# Patient Record
Sex: Female | Born: 1950
Health system: Southern US, Community
[De-identification: ages and names within clinical notes are randomized; demographics above are authoritative.]

## PROBLEM LIST (undated history)

## (undated) DIAGNOSIS — E559 Vitamin D deficiency, unspecified: Secondary | ICD-10-CM

## (undated) DIAGNOSIS — I639 Cerebral infarction, unspecified: Secondary | ICD-10-CM

## (undated) DIAGNOSIS — E119 Type 2 diabetes mellitus without complications: Secondary | ICD-10-CM

## (undated) DIAGNOSIS — I10 Essential (primary) hypertension: Secondary | ICD-10-CM

## (undated) DIAGNOSIS — E785 Hyperlipidemia, unspecified: Secondary | ICD-10-CM

## (undated) HISTORY — DX: Vitamin D deficiency, unspecified: E55.9

## (undated) HISTORY — DX: Hyperlipidemia, unspecified: E78.5

## (undated) HISTORY — DX: Cerebral infarction, unspecified: I63.9

## (undated) HISTORY — PX: EYE SURGERY: SHX253

---

## 2005-11-05 ENCOUNTER — Emergency Department (HOSPITAL_COMMUNITY): Admission: EM | Admit: 2005-11-05 | Discharge: 2005-11-05 | Payer: Self-pay | Admitting: *Deleted

## 2005-12-10 ENCOUNTER — Encounter: Admission: RE | Admit: 2005-12-10 | Discharge: 2005-12-10 | Payer: Self-pay | Admitting: General Practice

## 2005-12-18 ENCOUNTER — Encounter (HOSPITAL_COMMUNITY): Admission: RE | Admit: 2005-12-18 | Discharge: 2005-12-18 | Payer: Self-pay | Admitting: Cardiology

## 2012-11-17 ENCOUNTER — Emergency Department (HOSPITAL_COMMUNITY)
Admission: EM | Admit: 2012-11-17 | Discharge: 2012-11-17 | Disposition: A | Discharge status: Home / Self Care | Payer: PRIVATE HEALTH INSURANCE | Source: Home / Self Care | Attending: Family Medicine | Admitting: Family Medicine

## 2012-11-17 ENCOUNTER — Encounter (HOSPITAL_COMMUNITY): Payer: Self-pay | Admitting: Emergency Medicine

## 2012-11-17 DIAGNOSIS — I10 Essential (primary) hypertension: Secondary | ICD-10-CM

## 2012-11-17 DIAGNOSIS — R7309 Other abnormal glucose: Secondary | ICD-10-CM

## 2012-11-17 DIAGNOSIS — R110 Nausea: Secondary | ICD-10-CM

## 2012-11-17 DIAGNOSIS — R739 Hyperglycemia, unspecified: Secondary | ICD-10-CM

## 2012-11-17 DIAGNOSIS — R51 Headache: Secondary | ICD-10-CM

## 2012-11-17 LAB — POCT I-STAT, CHEM 8
Calcium, Ion: 1.24 mmol/L (ref 1.13–1.30)
Chloride: 100 mEq/L (ref 96–112)
Glucose, Bld: 242 mg/dL — ABNORMAL HIGH (ref 70–99)
HCT: 51 % — ABNORMAL HIGH (ref 36.0–46.0)
Hemoglobin: 17.3 g/dL — ABNORMAL HIGH (ref 12.0–15.0)
Potassium: 4.3 mEq/L (ref 3.5–5.1)

## 2012-11-17 MED ORDER — ACETAMINOPHEN 325 MG PO TABS
650.0000 mg | ORAL_TABLET | Freq: Once | ORAL | Status: AC
  Administered 2012-11-17: 650 mg via ORAL

## 2012-11-17 MED ORDER — ACETAMINOPHEN 325 MG PO TABS
ORAL_TABLET | ORAL | Status: AC
  Filled 2012-11-17: qty 2

## 2012-11-17 MED ORDER — HYDROCHLOROTHIAZIDE 25 MG PO TABS: 25.0000 mg | ORAL_TABLET | Freq: Every day | ORAL | Status: DC

## 2012-11-17 MED ORDER — ONDANSETRON 4 MG PO TBDP
8.0000 mg | ORAL_TABLET | Freq: Once | ORAL | Status: AC
  Administered 2012-11-17: 8 mg via ORAL

## 2012-11-17 MED ORDER — PROMETHAZINE HCL 25 MG PO TABS: 12.5000 mg | ORAL_TABLET | Freq: Four times a day (QID) | ORAL | Status: DC | PRN

## 2012-11-17 MED ORDER — ONDANSETRON 4 MG PO TBDP
ORAL_TABLET | ORAL | Status: AC
  Filled 2012-11-17: qty 2

## 2012-11-17 NOTE — ED Provider Notes (Signed)
Aimee Parker is a 62 y.o. female who presents to Urgent Care today for  1) nausea present off-and-on for several days. Patient has not had any vomiting or diarrhea or significant abdominal pain. She has not tried any medications yet. No other sick contacts. No fevers or chills. Everyone else as well. 2) headache present off-and-on for several days. Headache is mild and right-sided. She denies any weakness numbness loss of coordination or fatigue or lethargy. She has not tried any medications yet. 3) hypertension: Patient has a remote past history for hypertension. She has been on some unknown medications in the past. She has not taken any medications for several years now. She has not yet established with a primary care provider.  No chest pains palpitations or trouble breathing.   History reviewed. No pertinent past medical history. History  Substance Use Topics  . Smoking status: Not on file  . Smokeless tobacco: Not on file  . Alcohol Use: Not on file   ROS as above Medications reviewed. No current facility-administered medications for this encounter.   Current Outpatient Prescriptions  Medication Sig Dispense Refill  . hydrochlorothiazide (HYDRODIURIL) 25 MG tablet Take 1 tablet (25 mg total) by mouth daily.  30 tablet  0  . promethazine (PHENERGAN) 25 MG tablet Take 0.5-1 tablets (12.5-25 mg total) by mouth every 6 (six) hours as needed for nausea or vomiting.  30 tablet  0    Exam:  BP 188/78  Pulse 62  Temp(Src) 98.3 F (36.8 C) (Oral)  Resp 16  SpO2 98% Gen: Well NAD HEENT: EOMI,  MMM Lungs: CTABL Nl WOB Heart: RRR  systolic murmur present in the upper right sternal border radiating to the neck Abd: NABS, NT, ND soft no rebound or guarding no CVA tenderness to percussion Exts: Non edematous BL  LE, warm and well perfused.  Neuro: Alert and oriented normal balance and coordination normal gait.  Results for orders placed during the hospital encounter of 11/17/12 (from  the past 24 hour(s))  POCT I-STAT, CHEM 8     Status: Abnormal   Collection Time    11/17/12 11:35 AM      Result Value Range   Sodium 138  135 - 145 mEq/L   Potassium 4.3  3.5 - 5.1 mEq/L   Chloride 100  96 - 112 mEq/L   BUN 14  6 - 23 mg/dL   Creatinine, Ser 1.61  0.50 - 1.10 mg/dL   Glucose, Bld 096 (*) 70 - 99 mg/dL   Calcium, Ion 0.45  4.09 - 1.30 mmol/L   TCO2 28  0 - 100 mmol/L   Hemoglobin 17.3 (*) 12.0 - 15.0 g/dL   HCT 81.1 (*) 91.4 - 78.2 %   No results found.  Assessment and Plan: 62 y.o. female with  1) headache: Unclear etiology. Possible viral illness. Patient does not have any neurological abnormalities. Plan to treat empirically with Tylenol. Will present to [facility] for further evaluation and management.  2) nausea: Likely viral illness. Possibly related headache. Plan to treat with Zofran in the clinic and Phenergan at home. Followup as needed to 3) hypertension: Plan to restart hydrochlorothiazide. Followup at [facility]. 4) hyperglycemia: Likely diabetes. Reluctant to start metformin this patient has nausea already. Additionally I reluctant to start glipizide at this time as patient does not have a glucometer or any diabetes instructions. She is currently asymptomatic with her hyperglycemia. We'll followup at [facility].  Discussed warning signs or symptoms. Please see discharge instructions. Patient expresses understanding.      Rodolph Bong, MD 11/17/12 [phone]

## 2012-11-17 NOTE — ED Notes (Signed)
C/o headache and nausea for two days which comes and goes. Patient states when she does feel like this she stays still til the feeling end. Sx last for 5 minutes and then in an hour or two restarts.   No treatment done.

## 2012-11-30 ENCOUNTER — Encounter (HOSPITAL_COMMUNITY): Payer: Self-pay | Admitting: Emergency Medicine

## 2012-11-30 ENCOUNTER — Emergency Department (HOSPITAL_COMMUNITY)
Admission: EM | Admit: 2012-11-30 | Discharge: 2012-11-30 | Disposition: A | Discharge status: Home / Self Care | Payer: PRIVATE HEALTH INSURANCE | Source: Home / Self Care

## 2012-11-30 DIAGNOSIS — R42 Dizziness and giddiness: Secondary | ICD-10-CM

## 2012-11-30 DIAGNOSIS — R51 Headache: Secondary | ICD-10-CM

## 2012-11-30 DIAGNOSIS — H9390 Unspecified disorder of ear, unspecified ear: Secondary | ICD-10-CM

## 2012-11-30 DIAGNOSIS — H8390 Unspecified disease of inner ear, unspecified ear: Secondary | ICD-10-CM

## 2012-11-30 HISTORY — DX: Essential (primary) hypertension: I10

## 2012-11-30 MED ORDER — KETOROLAC TROMETHAMINE 30 MG/ML IJ SOLN
INTRAMUSCULAR | Status: AC
  Filled 2012-11-30: qty 1

## 2012-11-30 MED ORDER — METHYLPREDNISOLONE SODIUM SUCC 125 MG IJ SOLR
INTRAMUSCULAR | Status: AC
  Filled 2012-11-30: qty 2

## 2012-11-30 MED ORDER — ONDANSETRON HCL 4 MG PO TABS: 4.0000 mg | ORAL_TABLET | Freq: Four times a day (QID) | ORAL | Status: DC

## 2012-11-30 MED ORDER — METHYLPREDNISOLONE SODIUM SUCC 125 MG IJ SOLR
60.0000 mg | Freq: Once | INTRAMUSCULAR | Status: AC
  Administered 2012-11-30: 60 mg via INTRAMUSCULAR

## 2012-11-30 MED ORDER — KETOROLAC TROMETHAMINE 30 MG/ML IJ SOLN
30.0000 mg | Freq: Once | INTRAMUSCULAR | Status: AC
  Administered 2012-11-30: 30 mg via INTRAMUSCULAR

## 2012-11-30 MED ORDER — ONDANSETRON 4 MG PO TBDP
ORAL_TABLET | ORAL | Status: AC
  Filled 2012-11-30: qty 1

## 2012-11-30 MED ORDER — MECLIZINE HCL 25 MG PO TABS: 25.0000 mg | ORAL_TABLET | Freq: Three times a day (TID) | ORAL | Status: DC | PRN

## 2012-11-30 MED ORDER — ONDANSETRON 4 MG PO TBDP
4.0000 mg | ORAL_TABLET | Freq: Once | ORAL | Status: AC
  Administered 2012-11-30: 4 mg via ORAL

## 2012-11-30 NOTE — ED Provider Notes (Signed)
Medical screening examination/treatment/procedure(s) were performed by non-physician practitioner and as supervising physician I was immediately available for consultation/collaboration.  Zeva Leber, M.D.  Aunica Dauphinee C Surina Storts, MD 11/30/12 1623 

## 2012-11-30 NOTE — ED Notes (Signed)
C/o headache and blurry vision.  No weakness, reports this headache started last night. Patient was seen 11/6 for the same.

## 2012-11-30 NOTE — ED Provider Notes (Signed)
CSN: [id number]     Arrival date & time 11/30/12  1203 History   First MD Initiated Contact with Patient 11/30/12 1228     Chief Complaint  Patient presents with  . Headache   (Consider location/radiation/quality/duration/timing/severity/associated sxs/prior Treatment) HPI Comments: 62 year old female Asian woman who does not speak [place] and is accompanied by a significant other who translates for her. Last p.m. she developed dizziness followed by a headache. States her whole head hurts. The dizziness is worse with head movements and change in body positions. She prefers to remain in one position to limit her symptoms. She has nausea but no vomiting. Occasionally with blurred vision associated with dizziness.   Past Medical History  Diagnosis Date  . Hypertension    History reviewed. No pertinent past surgical history. No family history on file. History  Substance Use Topics  . Smoking status: Never Smoker   . Smokeless tobacco: Not on file  . Alcohol Use: No   OB History   Grav Para Term Preterm Abortions TAB SAB Ect Mult Living                 Review of Systems  Constitutional: Positive for activity change. Negative for fever and fatigue.  HENT: Negative for congestion, ear pain, facial swelling, hearing loss, postnasal drip, rhinorrhea, sinus pressure, sneezing, sore throat and trouble swallowing.   Eyes: Negative for photophobia and pain.  Respiratory: Negative for cough and shortness of breath.   Cardiovascular: Negative for chest pain.  Gastrointestinal: Positive for nausea. Negative for vomiting and diarrhea.  Genitourinary: Negative.   Musculoskeletal: Negative.   Skin: Negative for rash.  Neurological: Positive for dizziness and headaches. Negative for tremors, seizures, syncope, facial asymmetry, speech difficulty, weakness and numbness.  Psychiatric/Behavioral: Negative.     Allergies  Review of patient's allergies indicates no known allergies.  Home  Medications   Current Outpatient Rx  Name  Route  Sig  Dispense  Refill  . hydrochlorothiazide (HYDRODIURIL) 25 MG tablet   Oral   Take 1 tablet (25 mg total) by mouth daily.   30 tablet   0   . meclizine (ANTIVERT) 25 MG tablet   Oral   Take 1 tablet (25 mg total) by mouth 3 (three) times daily as needed for dizziness.   28 tablet   0   . ondansetron (ZOFRAN) 4 MG tablet   Oral   Take 1 tablet (4 mg total) by mouth every 6 (six) hours.   12 tablet   0   . promethazine (PHENERGAN) 25 MG tablet   Oral   Take 0.5-1 tablets (12.5-25 mg total) by mouth every 6 (six) hours as needed for nausea or vomiting.   30 tablet   0    BP 191/87  Pulse 69  Temp(Src) 97.2 F (36.2 C) (Oral)  Resp 20  SpO2 96% Physical Exam  Nursing note and vitals reviewed. Constitutional: She is oriented to person, place, and time. She appears well-developed and well-nourished. No distress.  HENT:  Head: Normocephalic and atraumatic.  Right Ear: External ear normal.  Left Ear: External ear normal.  Mouth/Throat: Oropharynx is clear and moist. No oropharyngeal exudate.  Soft palate rises symmetrically  Eyes: Conjunctivae and EOM are normal. Pupils are equal, round, and reactive to light.  Neck: Normal range of motion. Neck supple.  Cardiovascular: Normal rate, regular rhythm and normal heart sounds.   Pulmonary/Chest: Effort normal and breath sounds normal. No respiratory distress. She has no wheezes. She has no  rales.  Abdominal: Soft. There is no tenderness.  Musculoskeletal: Normal range of motion. She exhibits no edema and no tenderness.  Lymphadenopathy:    She has no cervical adenopathy.  Neurological: She is alert and oriented to person, place, and time. She has normal strength. No cranial nerve deficit or sensory deficit. She exhibits normal muscle tone. Coordination and gait normal.  Reflex Scores:      Patellar reflexes are 1+ on the right side and 1+ on the left side. Skin: Skin is  warm and dry.  Psychiatric: She has a normal mood and affect. Judgment and thought content normal.    ED Course  Procedures (including critical care time) Labs Review Labs Reviewed - No data to display Imaging Review No results found.    MDM   1. Disorder of inner ear, unspecified laterality   2. Dizziness   3. Headache     Neurologically stable. Unremarkable neuro exam. Suspect inner ear disorder or BPV.  Toradol 30 mg and solumedrol 60mg  IM and zofran 4 mg po now. Rx for zofran and meclizine as directed Go to the [facility] now to get appointment since they dont answer the phone when you call.  Hayden Rasmussen, NP 11/30/12 1257

## 2012-12-22 ENCOUNTER — Ambulatory Visit: Payer: PRIVATE HEALTH INSURANCE | Attending: Internal Medicine | Admitting: Internal Medicine

## 2012-12-22 ENCOUNTER — Encounter: Payer: Self-pay | Admitting: Internal Medicine

## 2012-12-22 VITALS — BP 145/78 | HR 80 | Temp 98.0°F | Resp 16 | Wt 142.4 lb

## 2012-12-22 DIAGNOSIS — Z23 Encounter for immunization: Secondary | ICD-10-CM

## 2012-12-22 DIAGNOSIS — H538 Other visual disturbances: Secondary | ICD-10-CM

## 2012-12-22 DIAGNOSIS — Z139 Encounter for screening, unspecified: Secondary | ICD-10-CM

## 2012-12-22 DIAGNOSIS — I10 Essential (primary) hypertension: Secondary | ICD-10-CM

## 2012-12-22 LAB — CBC WITH DIFFERENTIAL/PLATELET
Basophils Absolute: 0 10*3/uL (ref 0.0–0.1)
Eosinophils Absolute: 0.2 10*3/uL (ref 0.0–0.7)
Eosinophils Relative: 2 % (ref 0–5)
Hemoglobin: 14.1 g/dL (ref 12.0–15.0)
Lymphs Abs: 4.5 10*3/uL — ABNORMAL HIGH (ref 0.7–4.0)
MCV: 70.3 fL — ABNORMAL LOW (ref 78.0–100.0)
RDW: 16 % — ABNORMAL HIGH (ref 11.5–15.5)
WBC: 10.3 10*3/uL (ref 4.0–10.5)

## 2012-12-22 LAB — LIPID PANEL: Triglycerides: 406 mg/dL — ABNORMAL HIGH (ref <150)

## 2012-12-22 LAB — COMPLETE METABOLIC PANEL WITH GFR
ALT: 105 U/L — ABNORMAL HIGH (ref 0–35)
Alkaline Phosphatase: 99 U/L (ref 39–117)
BUN: 16 mg/dL (ref 6–23)
Calcium: 9.8 mg/dL (ref 8.4–10.5)
Chloride: 95 mEq/L — ABNORMAL LOW (ref 96–112)
GFR, Est African American: >89 mL/min
GFR, Est Non African American: >89 mL/min
Glucose, Bld: 365 mg/dL — ABNORMAL HIGH (ref 70–99)
Potassium: 4.2 mEq/L (ref 3.5–5.3)
Total Bilirubin: 0.6 mg/dL (ref 0.3–1.2)

## 2012-12-22 MED ORDER — HYDROCHLOROTHIAZIDE 25 MG PO TABS: 25.0000 mg | ORAL_TABLET | Freq: Every day | ORAL | Status: DC

## 2012-12-22 NOTE — Progress Notes (Signed)
Patient here to establish care Has hypertension Takes HCTZ

## 2012-12-22 NOTE — Progress Notes (Signed)
MRN: [id number] Name: Aimee Parker  Sex: female Age: 62 y.o. DOB: [birth year]  Allergies: Review of patient's allergies indicates no known allergies.  Chief Complaint  Patient presents with  . Establish Care    HPI: Patient is 62 y.o. female who is accompanied with a family member comes today to establish medical care, she has history of hypertension and is on hydrochlorothiazide, patient is requesting refill on the medication, denies any headache dizziness chest shortness of breath reported to have some blurry vision and would like to see ophthalmologist.   Past Medical History  Diagnosis Date  . Hypertension     History reviewed. No pertinent past surgical history.    Medication List       This list is accurate as of: 12/22/12 12:40 PM.  Always use your most recent med list.               hydrochlorothiazide 25 MG tablet  Commonly known as:  HYDRODIURIL  Take 1 tablet (25 mg total) by mouth daily.     meclizine 25 MG tablet  Commonly known as:  ANTIVERT  Take 1 tablet (25 mg total) by mouth 3 (three) times daily as needed for dizziness.     ondansetron 4 MG tablet  Commonly known as:  ZOFRAN  Take 1 tablet (4 mg total) by mouth every 6 (six) hours.     promethazine 25 MG tablet  Commonly known as:  PHENERGAN  Take 0.5-1 tablets (12.5-25 mg total) by mouth every 6 (six) hours as needed for nausea or vomiting.        Meds ordered this encounter  Medications  . hydrochlorothiazide (HYDRODIURIL) 25 MG tablet    Sig: Take 1 tablet (25 mg total) by mouth daily.    Dispense:  30 tablet    Refill:  3     There is no immunization history on file for this patient.  History  Substance Use Topics  . Smoking status: Never Smoker   . Smokeless tobacco: Not on file  . Alcohol Use: No    Review of Systems  As noted in HPI  Filed Vitals:   12/22/12 1224  BP: 145/78  Pulse: 80  Temp: 98 F (36.7 C)  Resp: 16    Physical Exam  Physical Exam   Constitutional: She is oriented to person, place, and time. No distress.  Eyes: EOM are normal. Pupils are equal, round, and reactive to light.  Left eye pterygium  Cardiovascular: Normal rate and regular rhythm.   Pulmonary/Chest: Breath sounds normal. No respiratory distress. She has no wheezes.  Musculoskeletal: She exhibits no edema.  Neurological: She is alert and oriented to person, place, and time.    CBC    Component Value Date/Time   HGB 17.3* 11/17/2012 1135   HCT 51.0* 11/17/2012 1135    CMP     Component Value Date/Time   NA 138 11/17/2012 1135   K 4.3 11/17/2012 1135   CL 100 11/17/2012 1135   GLUCOSE 242* 11/17/2012 1135   BUN 14 11/17/2012 1135   CREATININE 0.80 11/17/2012 1135    No results found for this basename: chol, tri, ldl    No components found with this basename: hga1c    No results found for this basename: AST    Assessment and Plan  Essential hypertension, benign - Plan: COMPLETE METABOLIC PANEL WITH GFR, advised patient for her low salt diet continue with hydrochlorothiazide (HYDRODIURIL) 25 MG tablet, Ambulatory referral to Ophthalmology  Screening -  Plan: CBC with Differential, COMPLETE METABOLIC PANEL WITH GFR, TSH, Lipid panel, Vit D  25 hydroxy (rtn osteoporosis monitoring)  Blurry vision - Plan: Ambulatory referral to Ophthalmology  Needs flu shot   Health Maintenance    Return in about 6 weeks (around 02/02/2013).  Doris Cheadle, MD

## 2012-12-23 ENCOUNTER — Telehealth: Payer: Self-pay | Admitting: Emergency Medicine

## 2012-12-23 LAB — VITAMIN D 25 HYDROXY (VIT D DEFICIENCY, FRACTURES): Vit D, 25-Hydroxy: 18 ng/mL — ABNORMAL LOW (ref 30–89)

## 2012-12-23 MED ORDER — VITAMIN D (ERGOCALCIFEROL) 1.25 MG (50000 UNIT) PO CAPS: 50000.0000 [IU] | ORAL_CAPSULE | ORAL | Status: DC

## 2012-12-23 MED ORDER — METFORMIN HCL 500 MG PO TABS: 500.0000 mg | ORAL_TABLET | Freq: Every day | ORAL | Status: DC

## 2012-12-23 MED ORDER — FENOFIBRATE 48 MG PO TABS: 48.0000 mg | ORAL_TABLET | Freq: Every day | ORAL | Status: DC

## 2012-12-23 NOTE — Progress Notes (Signed)
PT GIVEN LAB RESULTS WITH MEDICATION INSTRUCTIONS. PT INFORMED TO PICK SCRIPTS AT [facility]. WILL RECHECK LABS IN 3 MNTHS

## 2012-12-23 NOTE — Telephone Encounter (Signed)
Message copied by Darlis Loan on Fri Dec 23, 2012 11:36 AM ------      Message from: Doris Cheadle      Created: Fri Dec 23, 2012 10:29 AM       Blood work reviewed noticed elevated cholesterol as well as elevated sugar, call patient advise for low carbohydrate and low-fat diet also start 500 mg metformin daily and fenofibrate 48 mg daily       also noticed low vitamin D,  advise to start ergocalciferol 50,000 units once a week for the duration of  12 weeks.      Follow up as scheduled. Will check hemoglobin A1c on the next visit.       ------

## 2012-12-23 NOTE — Telephone Encounter (Signed)
Pt given lab results and new scripts sent to [facility] pharmacy

## 2012-12-23 NOTE — Addendum Note (Signed)
Addended by: Nonnie Done D on: 12/23/2012 12:21 PM   Modules accepted: Orders

## 2013-02-02 ENCOUNTER — Ambulatory Visit: Payer: PRIVATE HEALTH INSURANCE | Attending: Internal Medicine | Admitting: Internal Medicine

## 2013-02-02 VITALS — BP 193/83 | HR 76 | Temp 98.9°F | Resp 14 | Ht 64.0 in | Wt 142.0 lb

## 2013-02-02 DIAGNOSIS — E119 Type 2 diabetes mellitus without complications: Secondary | ICD-10-CM | POA: Insufficient documentation

## 2013-02-02 DIAGNOSIS — I10 Essential (primary) hypertension: Secondary | ICD-10-CM

## 2013-02-02 DIAGNOSIS — E781 Pure hyperglyceridemia: Secondary | ICD-10-CM

## 2013-02-02 DIAGNOSIS — Z23 Encounter for immunization: Secondary | ICD-10-CM

## 2013-02-02 LAB — COMPLETE METABOLIC PANEL WITH GFR
ALBUMIN: 4.5 g/dL (ref 3.5–5.2)
ALT: 41 U/L — ABNORMAL HIGH (ref 0–35)
AST: 48 U/L — ABNORMAL HIGH (ref 0–37)
Alkaline Phosphatase: 64 U/L (ref 39–117)
BUN: 20 mg/dL (ref 6–23)
CALCIUM: 9.7 mg/dL (ref 8.4–10.5)
CO2: 30 mEq/L (ref 19–32)
CREATININE: 0.92 mg/dL (ref 0.50–1.10)
Chloride: 96 mEq/L (ref 96–112)
GFR, EST AFRICAN AMERICAN: 77 mL/min
GFR, EST NON AFRICAN AMERICAN: 67 mL/min
Glucose, Bld: 357 mg/dL — ABNORMAL HIGH (ref 70–99)
POTASSIUM: 4.5 meq/L (ref 3.5–5.3)
SODIUM: 135 meq/L (ref 135–145)
TOTAL PROTEIN: 7.7 g/dL (ref 6.0–8.3)
Total Bilirubin: 0.5 mg/dL (ref 0.3–1.2)

## 2013-02-02 LAB — POCT GLYCOSYLATED HEMOGLOBIN (HGB A1C): HEMOGLOBIN A1C: 13.1

## 2013-02-02 LAB — LIPID PANEL
CHOL/HDL RATIO: 3.4 ratio
CHOLESTEROL: 232 mg/dL — AB (ref 0–200)
HDL: 68 mg/dL (ref >39)
LDL Cholesterol: 125 mg/dL — ABNORMAL HIGH (ref 0–99)
TRIGLYCERIDES: 197 mg/dL — AB (ref <150)
VLDL: 39 mg/dL (ref 0–40)

## 2013-02-02 LAB — GLUCOSE, POCT (MANUAL RESULT ENTRY): POC Glucose: 325 mg/dl — AB (ref 70–99)

## 2013-02-02 MED ORDER — GLUCOSE BLOOD VI STRP: ORAL_STRIP | Status: DC

## 2013-02-02 MED ORDER — TRAZODONE HCL 50 MG PO TABS: 25.0000 mg | ORAL_TABLET | Freq: Every evening | ORAL | Status: DC | PRN

## 2013-02-02 MED ORDER — HYDROCHLOROTHIAZIDE 25 MG PO TABS: 25.0000 mg | ORAL_TABLET | Freq: Every day | ORAL | Status: DC

## 2013-02-02 MED ORDER — FREESTYLE SYSTEM KIT: 1.0000 | PACK | Status: DC | PRN

## 2013-02-02 MED ORDER — METFORMIN HCL 850 MG PO TABS: 850.0000 mg | ORAL_TABLET | Freq: Two times a day (BID) | ORAL | Status: DC

## 2013-02-02 MED ORDER — "INSULIN SYRINGE 28G X 1/2"" 1 ML MISC": Status: DC

## 2013-02-02 MED ORDER — INSULIN NPH ISOPHANE & REGULAR (70-30) 100 UNIT/ML ~~LOC~~ SUSP: 15.0000 [IU] | Freq: Two times a day (BID) | SUBCUTANEOUS | Status: DC

## 2013-02-02 MED ORDER — LISINOPRIL 20 MG PO TABS: 20.0000 mg | ORAL_TABLET | Freq: Every day | ORAL | Status: DC

## 2013-02-02 NOTE — Progress Notes (Signed)
Pt is here for a f/u for hypertension. BP today is 193/83. Complains about having trouble sleeping x1 week.  Has questions about medications. Pt's son is her interpreter.

## 2013-02-02 NOTE — Progress Notes (Signed)
Patient ID: Aimee Parker, female   DOB: 1950/11/30, 63 y.o.   MRN: [id number]  CC:  HPI:  63 year old female, [place] presents to the clinic for a followup. The patient has multiple issues she is hypertensive with systolic blood pressure the 190s. On her most recent labs on 12/22/12, the patient had mildly elevated AST, ALT, triglycerides of 406, she states that she has been compliant with most of her medications, she states that she worried a lot and has a hard time falling asleep   No Known Allergies Past Medical History  Diagnosis Date  . Hypertension    Current Outpatient Prescriptions on File Prior to Visit  Medication Sig Dispense Refill  . fenofibrate (TRICOR) 48 MG tablet Take 1 tablet (48 mg total) by mouth daily.  90 tablet  3  . meclizine (ANTIVERT) 25 MG tablet Take 1 tablet (25 mg total) by mouth 3 (three) times daily as needed for dizziness.  28 tablet  0  . ondansetron (ZOFRAN) 4 MG tablet Take 1 tablet (4 mg total) by mouth every 6 (six) hours.  12 tablet  0  . promethazine (PHENERGAN) 25 MG tablet Take 0.5-1 tablets (12.5-25 mg total) by mouth every 6 (six) hours as needed for nausea or vomiting.  30 tablet  0  . Vitamin D, Ergocalciferol, (DRISDOL) 50000 UNITS CAPS capsule Take 1 capsule (50,000 Units total) by mouth every 7 (seven) days.  48 capsule  0   No current facility-administered medications on file prior to visit.   Family History  Problem Relation Age of Onset  . Hypertension Mother   . Hypertension Father    History   Social History  . Marital Status: Married    Spouse Name: N/A    Number of Children: N/A  . Years of Education: N/A   Occupational History  . Not on file.   Social History Main Topics  . Smoking status: Never Smoker   . Smokeless tobacco: Not on file  . Alcohol Use: No  . Drug Use: No  . Sexual Activity: Not on file   Other Topics Concern  . Not on file   Social History Narrative  . No narrative on file    Review of  Systems  Constitutional: As in history of present illness HENT: Negative for ear pain, nosebleeds, congestion, facial swelling, rhinorrhea, neck pain, neck stiffness and ear discharge.   Eyes: Negative for pain, discharge, redness, itching and visual disturbance.  Respiratory: Negative for cough, choking, chest tightness, shortness of breath, wheezing and stridor.   Cardiovascular: Negative for chest pain, palpitations and leg swelling.  Gastrointestinal: Negative for abdominal distention.  Genitourinary: Negative for dysuria, urgency, frequency, hematuria, flank pain, decreased urine volume, difficulty urinating and dyspareunia.  Musculoskeletal: Negative for back pain, joint swelling, arthralgias and gait problem.  Neurological: As in history of present illness Hematological: Negative for adenopathy. Does not bruise/bleed easily.  Psychiatric/Behavioral: Negative for hallucinations, behavioral problems, confusion, dysphoric mood, decreased concentration and agitation.    Objective:   Filed Vitals:   02/02/13 1136  BP: 193/83  Pulse: 76  Temp: 98.9 F (37.2 C)  Resp: 14    Physical Exam  Constitutional: Appears well-developed and well-nourished. No distress.  HENT: Normocephalic. External right and left ear normal. Oropharynx is clear and moist.  Eyes: Conjunctivae and EOM are normal. PERRLA, no scleral icterus.  Neck: Normal ROM. Neck supple. No JVD. No tracheal deviation. No thyromegaly.  CVS: RRR, S1/S2 +, no murmurs, no gallops, no carotid  bruit.  Pulmonary: Effort and breath sounds normal, no stridor, rhonchi, wheezes, rales.  Abdominal: Soft. BS +,  no distension, tenderness, rebound or guarding.  Musculoskeletal: Normal range of motion. No edema and no tenderness.  Lymphadenopathy: No lymphadenopathy noted, cervical, inguinal. Neuro: Alert. Normal reflexes, muscle tone coordination. No cranial nerve deficit. Skin: Skin is warm and dry. No rash noted. Not diaphoretic. No  erythema. No pallor.  Psychiatric: Normal mood and affect. Behavior, judgment, thought content normal.   Lab Results  Component Value Date   WBC 10.3 12/22/2012   HGB 14.1 12/22/2012   HCT 43.5 12/22/2012   MCV 70.3* 12/22/2012   PLT 265 12/22/2012   Lab Results  Component Value Date   CREATININE 0.70 12/22/2012   BUN 16 12/22/2012   NA 134* 12/22/2012   K 4.2 12/22/2012   CL 95* 12/22/2012   CO2 25 12/22/2012    No results found for this basename: HGBA1C   Lipid Panel     Component Value Date/Time   CHOL 328* 12/22/2012 1239   TRIG 406* 12/22/2012 1239   HDL 67 12/22/2012 1239   CHOLHDL 4.9 12/22/2012 1239   VLDL NOT CALC 12/22/2012 1239   LDLCALC Comment:   Not calculated due to Triglyceride >400. Suggest ordering Direct LDL (Unit Code: 16109).   Total Cholesterol/HDL Ratio:CHD Risk                        Coronary Heart Disease Risk Table                                        Men       Women          1/2 Average Risk              3.4        3.3              Average Risk              5.0        4.4           2X Average Risk              9.6        7.1           3X Average Risk             23.4       11.0 Use the calculated Patient Ratio above and the CHD Risk table  to determine the patient's CHD Risk. ATP III Classification (LDL):       < 100        mg/dL         Optimal      604 - 129     mg/dL         Near or Above Optimal      130 - 159     mg/dL         Borderline High      160 - 189     mg/dL         High       > 540        mg/dL         Very High   98/11/9145 1239       Assessment and plan:  Patient Active Problem List   Diagnosis Date Noted  . Essential hypertension, benign 12/22/2012  . Hyperglycemia 11/17/2012  . Hypertension, uncontrolled 11/17/2012   Hypertension, uncontrolled Continue HCTZ and add lisinopril 20 mg a day Renal panel Will give clonidine 0.3 mg times one and observe before discharge   Abnormal liver function likely secondary to fatty  liver Obtain right upper quadrant ultrasound  Diabetes A1c pending, CBG greater than 300 Increase metformin to 850 twice a day   Hypertriglyceridemia Continue TriCor and repeat lipid panel today  Blood pressure check in one week otherwise follow up in 2 months      The patient was given clear instructions to go to ER or return to medical center if symptoms don't improve, worsen or new problems develop. The patient verbalized understanding. The patient was told to call to get any lab results if not heard anything in the next week.

## 2013-02-02 NOTE — Addendum Note (Signed)
Addended by: Susie CassetteABROL MD, Germain OsgoodNAYANA on: 02/02/2013 04:31 PM   Modules accepted: Orders

## 2013-02-03 ENCOUNTER — Telehealth: Payer: Self-pay | Admitting: *Deleted

## 2013-02-03 NOTE — Telephone Encounter (Signed)
Contacted pt to notify her of her US appointment on 02/07/2013 at 9:30am at [facility]. Pt should arrive at 9:15am for registration. Left a voicemail for pt to give us a call back.

## 2013-02-03 NOTE — Telephone Encounter (Signed)
Message copied by Laelani Vasko, [place] on Fri Feb 03, 2013  3:05 PM ------      Message from: Susie CassetteABROL MD, Germain OsgoodNAYANA      Created: Thu Feb 02, 2013  4:32 PM       Notify patient A1c 13.1. Patient being started on insulin, Humulin 70/30, prescription called into the pharmacy, patient needs insulin teaching ------

## 2013-02-03 NOTE — Telephone Encounter (Signed)
Message copied by Mikal Wisman, [place] on Fri Feb 03, 2013 12:10 PM ------      Message from: Susie CassetteABROL MD, Germain OsgoodNAYANA      Created: Fri Feb 03, 2013 10:13 AM       Notify patient liver function and triglycerides are improving, diabetes is still uncontrolled, metformin dose has already been adjusted ------

## 2013-02-03 NOTE — Telephone Encounter (Signed)
Scheduled an appointment for tomorrow's clinic for patient to come in for insulin teaching.

## 2013-02-04 ENCOUNTER — Ambulatory Visit: Payer: PRIVATE HEALTH INSURANCE | Attending: Internal Medicine | Admitting: Pharmacist

## 2013-02-04 VITALS — BP 180/71 | HR 67 | Temp 98.2°F | Resp 17

## 2013-02-04 DIAGNOSIS — I10 Essential (primary) hypertension: Secondary | ICD-10-CM

## 2013-02-04 NOTE — Progress Notes (Signed)
Patient here for blood pressure check 180/71  P67

## 2013-02-07 ENCOUNTER — Ambulatory Visit (HOSPITAL_COMMUNITY): Payer: PRIVATE HEALTH INSURANCE

## 2013-02-07 ENCOUNTER — Ambulatory Visit: Payer: PRIVATE HEALTH INSURANCE | Attending: Internal Medicine | Admitting: Pharmacist

## 2013-02-07 VITALS — BP 146/80 | HR 79

## 2013-02-07 DIAGNOSIS — I10 Essential (primary) hypertension: Secondary | ICD-10-CM

## 2013-03-08 ENCOUNTER — Other Ambulatory Visit: Payer: Self-pay | Admitting: Internal Medicine

## 2013-03-08 MED ORDER — INSULIN NPH ISOPHANE & REGULAR (70-30) 100 UNIT/ML ~~LOC~~ SUSP: 15.0000 [IU] | Freq: Two times a day (BID) | SUBCUTANEOUS | Status: DC

## 2013-04-04 ENCOUNTER — Encounter: Payer: Self-pay | Admitting: Internal Medicine

## 2013-04-04 ENCOUNTER — Ambulatory Visit: Payer: PRIVATE HEALTH INSURANCE | Attending: Internal Medicine | Admitting: Internal Medicine

## 2013-04-04 VITALS — BP 168/81 | HR 67 | Temp 98.6°F | Resp 16 | Ht 61.5 in | Wt 137.0 lb

## 2013-04-04 DIAGNOSIS — Z760 Encounter for issue of repeat prescription: Secondary | ICD-10-CM | POA: Insufficient documentation

## 2013-04-04 DIAGNOSIS — Z79899 Other long term (current) drug therapy: Secondary | ICD-10-CM | POA: Insufficient documentation

## 2013-04-04 DIAGNOSIS — Z794 Long term (current) use of insulin: Secondary | ICD-10-CM | POA: Insufficient documentation

## 2013-04-04 DIAGNOSIS — I10 Essential (primary) hypertension: Secondary | ICD-10-CM | POA: Insufficient documentation

## 2013-04-04 DIAGNOSIS — E785 Hyperlipidemia, unspecified: Secondary | ICD-10-CM | POA: Insufficient documentation

## 2013-04-04 DIAGNOSIS — E119 Type 2 diabetes mellitus without complications: Secondary | ICD-10-CM

## 2013-04-04 DIAGNOSIS — E781 Pure hyperglyceridemia: Secondary | ICD-10-CM

## 2013-04-04 LAB — GLUCOSE, POCT (MANUAL RESULT ENTRY): POC Glucose: 159 mg/dl — AB (ref 70–99)

## 2013-04-04 MED ORDER — FENOFIBRATE 48 MG PO TABS: 48.0000 mg | ORAL_TABLET | Freq: Every day | ORAL | Status: DC

## 2013-04-04 MED ORDER — INSULIN NPH ISOPHANE & REGULAR (70-30) 100 UNIT/ML ~~LOC~~ SUSP: 15.0000 [IU] | Freq: Two times a day (BID) | SUBCUTANEOUS | Status: DC

## 2013-04-04 MED ORDER — METFORMIN HCL 850 MG PO TABS: 850.0000 mg | ORAL_TABLET | Freq: Two times a day (BID) | ORAL | Status: DC

## 2013-04-04 MED ORDER — VITAMIN D (ERGOCALCIFEROL) 1.25 MG (50000 UNIT) PO CAPS
50000.0000 [IU] | ORAL_CAPSULE | ORAL | Status: DC
Start: 2013-04-04 — End: 2016-09-18

## 2013-04-04 MED ORDER — LISINOPRIL 20 MG PO TABS: 20.0000 mg | ORAL_TABLET | Freq: Every day | ORAL | Status: DC

## 2013-04-04 MED ORDER — HYDROCHLOROTHIAZIDE 25 MG PO TABS: 25.0000 mg | ORAL_TABLET | Freq: Every day | ORAL | Status: DC

## 2013-04-04 NOTE — Progress Notes (Signed)
Pt is here following up on her diabetes and HTN.  

## 2013-04-04 NOTE — Progress Notes (Signed)
Patient ID: Aimee Parker, female   DOB: July 25, 1950, 63 y.o.   MRN: [id number]   Aimee Parker, is a 63 y.o. female  [id number]  [id number]  DOB - 1950-09-20  Chief Complaint  Patient presents with  . Follow-up        Subjective:   Aimee Parker is a 63 y.o. female here today for a follow up visit. Patient has history of hypertension, dyslipidemia, diabetes requiring insulin. Her medications include TriCor, hydrochlorothiazide, lisinopril, metformin and insulin NPH. She claims compliant with medications. She is doing very well now she is only here for medication refill. She is struggling to [place] for about 2 months she will need to call her medications refilled so she can have enough while she is away. She is not due for hemoglobin A1c today but will be in between her travel. She does not smoke cigarette, she does not drink alcohol. She reports no side effects of medication. She said her dizziness is resolved. Patient has No headache, No chest pain, No abdominal pain - No Nausea, No new weakness tingling or numbness, No Cough - SOB.  No problems updated.  ALLERGIES: No Known Allergies  PAST MEDICAL HISTORY: Past Medical History  Diagnosis Date  . Hypertension     MEDICATIONS AT HOME: Prior to Admission medications   Medication Sig Start Date End Date Taking? Authorizing Provider  fenofibrate (TRICOR) 48 MG tablet Take 1 tablet (48 mg total) by mouth daily. 04/04/13  Yes Angelica Chessman, MD  glucose blood test strip Use as instructed 02/02/13  Yes Reyne Dumas, MD  glucose monitoring kit (FREESTYLE) monitoring kit 1 each by Does not apply route as needed for other. 02/02/13  Yes Reyne Dumas, MD  hydrochlorothiazide (HYDRODIURIL) 25 MG tablet Take 1 tablet (25 mg total) by mouth daily. 04/04/13  Yes Angelica Chessman, MD  insulin NPH-regular Human (NOVOLIN 70/30) (70-30) 100 UNIT/ML injection Inject 15 Units into the skin 2 (two) times daily with a meal. 04/04/13  Yes Angelica Chessman, MD  Insulin Syringe-Needle U-100 (INSULIN SYRINGE 1CC/28G) 28G X 1/2" 1 ML MISC 300 02/02/13  Yes Reyne Dumas, MD  lisinopril (PRINIVIL,ZESTRIL) 20 MG tablet Take 1 tablet (20 mg total) by mouth daily. 04/04/13  Yes Angelica Chessman, MD  meclizine (ANTIVERT) 25 MG tablet Take 1 tablet (25 mg total) by mouth 3 (three) times daily as needed for dizziness. 11/30/12  Yes [name], NP  metFORMIN (GLUCOPHAGE) 850 MG tablet Take 1 tablet (850 mg total) by mouth 2 (two) times daily with a meal. 04/04/13  Yes Angelica Chessman, MD  ondansetron (ZOFRAN) 4 MG tablet Take 1 tablet (4 mg total) by mouth every 6 (six) hours. 11/30/12  Yes [name], NP  promethazine (PHENERGAN) 25 MG tablet Take 0.5-1 tablets (12.5-25 mg total) by mouth every 6 (six) hours as needed for nausea or vomiting. 11/17/12  Yes Gregor Hams, MD  traZODone (DESYREL) 50 MG tablet Take 0.5-1 tablets (25-50 mg total) by mouth at bedtime as needed for sleep. 02/02/13  Yes Reyne Dumas, MD  Vitamin D, Ergocalciferol, (DRISDOL) 50000 UNITS CAPS capsule Take 1 capsule (50,000 Units total) by mouth every 7 (seven) days. 04/04/13  Yes Angelica Chessman, MD     Objective:   Filed Vitals:   04/04/13 1040 04/04/13 1117  BP: 161/79 168/81  Pulse: 67   Temp: 98.6 F (37 C)   TempSrc: Oral   Resp: 16   Height: 5' 1.5" (1.562 m)   Weight: 137 lb (62.143 kg)  SpO2: 99%     Exam General appearance : Awake, alert, not in any distress. Speech Clear. Not toxic looking HEENT: Atraumatic and Normocephalic, pupils equally reactive to light and accomodation Neck: supple, no JVD. No cervical lymphadenopathy.  Chest:Good air entry bilaterally, no added sounds  CVS: S1 S2 regular, no murmurs.  Abdomen: Bowel sounds present, Non tender and not distended with no gaurding, rigidity or rebound. Extremities: B/L Lower Ext shows no edema, both legs are warm to touch Neurology: Awake alert, and oriented X 3, CN II-XII intact, Non focal Skin:No  Rash Wounds:N/A  Data Review Lab Results  Component Value Date   HGBA1C 13.1 02/02/2013     Assessment & Plan   1. Essential hypertension, benign Refill - hydrochlorothiazide (HYDRODIURIL) 25 MG tablet; Take 1 tablet (25 mg total) by mouth daily.  Dispense: 90 tablet; Refill: 3 - lisinopril (PRINIVIL,ZESTRIL) 20 MG tablet; Take 1 tablet (20 mg total) by mouth daily.  Dispense: 90 tablet; Refill: 3  2. Diabetes Refill - insulin NPH-regular Human (NOVOLIN 70/30) (70-30) 100 UNIT/ML injection; Inject 15 Units into the skin 2 (two) times daily with a meal.  Dispense: 6 vial; Refill: 3 - metFORMIN (GLUCOPHAGE) 850 MG tablet; Take 1 tablet (850 mg total) by mouth 2 (two) times daily with a meal.  Dispense: 180 tablet; Refill: 3  Refill - Vitamin D, Ergocalciferol, (DRISDOL) 50000 UNITS CAPS capsule; Take 1 capsule (50,000 Units total) by mouth every 7 (seven) days.  Dispense: 48 capsule; Refill: 0  3. Hypertriglyceridemia Refill - fenofibrate (TRICOR) 48 MG tablet; Take 1 tablet (48 mg total) by mouth daily.  Dispense: 90 tablet; Refill: 3  Patient has been counseled extensively about nutrition and exercise Patient was told to come back to the clinic as soon as she returns from [place]  Interpreter was used to communicate directly with patient for the entire encounter including providing detailed patient instructions.   Return in about 3 months (around 07/05/2013), or if symptoms worsen or fail to improve, for Hemoglobin A1C and Follow up, DM.  The patient was given clear instructions to go to ER or return to medical center if symptoms don't improve, worsen or new problems develop. The patient verbalized understanding.   This note has been created with Surveyor, quantity. Any transcriptional errors are unintentional.    Angelica Chessman, MD, [place], [place], [place] and [facility] [place], [place]     04/04/2013, 11:22 AM

## 2013-07-06 ENCOUNTER — Ambulatory Visit: Payer: PRIVATE HEALTH INSURANCE | Attending: Internal Medicine | Admitting: Internal Medicine

## 2013-07-06 ENCOUNTER — Encounter: Payer: Self-pay | Admitting: Internal Medicine

## 2013-07-06 VITALS — BP 161/81 | HR 82 | Temp 99.3°F | Resp 16 | Ht 62.0 in | Wt 141.0 lb

## 2013-07-06 DIAGNOSIS — R011 Cardiac murmur, unspecified: Secondary | ICD-10-CM

## 2013-07-06 DIAGNOSIS — E119 Type 2 diabetes mellitus without complications: Secondary | ICD-10-CM

## 2013-07-06 DIAGNOSIS — I10 Essential (primary) hypertension: Secondary | ICD-10-CM

## 2013-07-06 DIAGNOSIS — R059 Cough, unspecified: Secondary | ICD-10-CM

## 2013-07-06 DIAGNOSIS — Z794 Long term (current) use of insulin: Secondary | ICD-10-CM | POA: Insufficient documentation

## 2013-07-06 DIAGNOSIS — R05 Cough: Secondary | ICD-10-CM

## 2013-07-06 DIAGNOSIS — E781 Pure hyperglyceridemia: Secondary | ICD-10-CM | POA: Insufficient documentation

## 2013-07-06 LAB — GLUCOSE, POCT (MANUAL RESULT ENTRY): POC Glucose: 296 mg/dl — AB (ref 70–99)

## 2013-07-06 LAB — POCT GLYCOSYLATED HEMOGLOBIN (HGB A1C): Hemoglobin A1C: 8

## 2013-07-06 MED ORDER — GLUCOSE BLOOD VI STRP: ORAL_STRIP | Status: DC

## 2013-07-06 MED ORDER — GUAIFENESIN-DM 100-10 MG/5ML PO SYRP: 5.0000 mL | ORAL_SOLUTION | ORAL | Status: DC | PRN

## 2013-07-06 MED ORDER — TRAZODONE HCL 50 MG PO TABS: 25.0000 mg | ORAL_TABLET | Freq: Every evening | ORAL | Status: DC | PRN

## 2013-07-06 MED ORDER — LISINOPRIL 20 MG PO TABS: 20.0000 mg | ORAL_TABLET | Freq: Every day | ORAL | Status: DC

## 2013-07-06 MED ORDER — METFORMIN HCL 1000 MG PO TABS: 1000.0000 mg | ORAL_TABLET | Freq: Two times a day (BID) | ORAL | Status: DC

## 2013-07-06 MED ORDER — FENOFIBRATE 48 MG PO TABS: 48.0000 mg | ORAL_TABLET | Freq: Every day | ORAL | Status: DC

## 2013-07-06 MED ORDER — FREESTYLE SYSTEM KIT: 1.0000 | PACK | Status: DC | PRN

## 2013-07-06 MED ORDER — "INSULIN SYRINGE 28G X 1/2"" 1 ML MISC": Status: DC

## 2013-07-06 MED ORDER — HYDROCHLOROTHIAZIDE 25 MG PO TABS: 25.0000 mg | ORAL_TABLET | Freq: Every day | ORAL | Status: DC

## 2013-07-06 MED ORDER — INSULIN NPH ISOPHANE & REGULAR (70-30) 100 UNIT/ML ~~LOC~~ SUSP: 15.0000 [IU] | Freq: Two times a day (BID) | SUBCUTANEOUS | Status: DC

## 2013-07-06 NOTE — Progress Notes (Signed)
Pt is here following up on her HTN and diabetes. Pt states that she has been coughing a lot.

## 2013-07-06 NOTE — Progress Notes (Signed)
Patient ID: Aimee Parker, female   DOB: 1950-05-17, 63 y.o.   MRN: [id number]   Aimee Parker, is a 63 y.o. female  [id number]  [id number]  DOB - [birth year]  Chief Complaint  Patient presents with  . Follow-up        Subjective:   Aimee Parker is a 63 y.o. female here today for a follow up visit. Patient is known to have hypertension, diabetes, hypertriglyceridemia, here today for her routine follow-up. Her major complaint today is cough and sore throat, positive contact. Cough is productive of whitish sputum, no fever, no chest pain. Her blood pressure is certainly controlled at home the patient has not taken her medication today. Her last hemoglobin A1c was 13.1%, today is 8.0%. Patient claims compliant with medications. She got her previous medication free from the pharmacy here. Patient does not smoke cigarettes, she does not drink alcohol. Patient has No headache, No abdominal pain - No Nausea, No new weakness tingling or numbness, No Cough - SOB.  Problem  Cough  Hypertriglyceridemia  Type 2 Diabetes Mellitus Without Complication    ALLERGIES: No Known Allergies  PAST MEDICAL HISTORY: Past Medical History  Diagnosis Date  . Hypertension     MEDICATIONS AT HOME: Prior to Admission medications   Medication Sig Start Date End Date Taking? Authorizing Provider  fenofibrate (TRICOR) 48 MG tablet Take 1 tablet (48 mg total) by mouth daily. 07/06/13  Yes Angelica Chessman, MD  glucose blood test strip Use as instructed 07/06/13  Yes Angelica Chessman, MD  glucose monitoring kit (FREESTYLE) monitoring kit 1 each by Does not apply route as needed for other. 07/06/13  Yes Angelica Chessman, MD  hydrochlorothiazide (HYDRODIURIL) 25 MG tablet Take 1 tablet (25 mg total) by mouth daily. 07/06/13  Yes Angelica Chessman, MD  insulin NPH-regular Human (NOVOLIN 70/30) (70-30) 100 UNIT/ML injection Inject 15 Units into the skin 2 (two) times daily with a meal. 07/06/13  Yes Angelica Chessman, MD  Insulin Syringe-Needle U-100 (INSULIN SYRINGE 1CC/28G) 28G X 1/2" 1 ML MISC 300 07/06/13  Yes Angelica Chessman, MD  lisinopril (PRINIVIL,ZESTRIL) 20 MG tablet Take 1 tablet (20 mg total) by mouth daily. 07/06/13  Yes Angelica Chessman, MD  meclizine (ANTIVERT) 25 MG tablet Take 1 tablet (25 mg total) by mouth 3 (three) times daily as needed for dizziness. 11/30/12  Yes [name], NP  metFORMIN (GLUCOPHAGE) 1000 MG tablet Take 1 tablet (1,000 mg total) by mouth 2 (two) times daily with a meal. 07/06/13  Yes Angelica Chessman, MD  ondansetron (ZOFRAN) 4 MG tablet Take 1 tablet (4 mg total) by mouth every 6 (six) hours. 11/30/12  Yes [name], NP  promethazine (PHENERGAN) 25 MG tablet Take 0.5-1 tablets (12.5-25 mg total) by mouth every 6 (six) hours as needed for nausea or vomiting. 11/17/12  Yes Gregor Hams, MD  traZODone (DESYREL) 50 MG tablet Take 0.5-1 tablets (25-50 mg total) by mouth at bedtime as needed for sleep. 07/06/13  Yes Angelica Chessman, MD  Vitamin D, Ergocalciferol, (DRISDOL) 50000 UNITS CAPS capsule Take 1 capsule (50,000 Units total) by mouth every 7 (seven) days. 04/04/13  Yes Angelica Chessman, MD  guaiFENesin-dextromethorphan (ROBITUSSIN DM) 100-10 MG/5ML syrup Take 5 mLs by mouth every 4 (four) hours as needed for cough. 07/06/13   Angelica Chessman, MD     Objective:   Filed Vitals:   07/06/13 1612  BP: 161/81  Pulse: 82  Temp: 99.3 F (37.4 C)  TempSrc: Oral  Resp: 16  Height: 5' 2" (1.575 m)  Weight: 141 lb (63.957 kg)  SpO2: 95%    Exam General appearance : Awake, alert, not in any distress. Speech Clear. Not toxic looking HEENT: Atraumatic and Normocephalic, pupils equally reactive to light and accomodation Neck: supple, no JVD. No cervical lymphadenopathy.  Chest:Good air entry bilaterally, no added sounds  CVS: S1 S2 regular, no murmurs.  Abdomen: Bowel sounds present, Non tender and not distended with no gaurding, rigidity or  rebound. Extremities: B/L Lower Ext shows no edema, both legs are warm to touch Neurology: Awake alert, and oriented X 3, CN II-XII intact, Non focal Skin:No Rash Wounds:N/A  Data Review Lab Results  Component Value Date   HGBA1C 8.0 07/06/2013   HGBA1C 13.1 02/02/2013     Assessment & Plan   1. Type 2 diabetes mellitus without complication  - Glucose (CBG) - HgB A1c  - insulin NPH-regular Human (NOVOLIN 70/30) (70-30) 100 UNIT/ML injection; Inject 15 Units into the skin 2 (two) times daily with a meal.  Dispense: 3 vial; Refill: 3  - Insulin Syringe-Needle U-100 (INSULIN SYRINGE 1CC/28G) 28G X 1/2" 1 ML MISC; 300  Dispense: 300 each; Refill: 3  - glucose blood test strip; Use as instructed  Dispense: 100 each; Refill: 12  - metFORMIN (GLUCOPHAGE) 1000 MG tablet; Take 1 tablet (1,000 mg total) by mouth 2 (two) times daily with a meal.  Dispense: 180 tablet; Refill: 3  - traZODone (DESYREL) 50 MG tablet; Take 0.5-1 tablets (25-50 mg total) by mouth at bedtime as needed for sleep.  Dispense: 30 tablet; Refill: 3  - glucose monitoring kit (FREESTYLE) monitoring kit; 1 each by Does not apply route as needed for other.  Dispense: 1 each; Refill: 2  2. Hypertriglyceridemia  - fenofibrate (TRICOR) 48 MG tablet; Take 1 tablet (48 mg total) by mouth daily.  Dispense: 90 tablet; Refill: 3  3. Essential hypertension, benign  - hydrochlorothiazide (HYDRODIURIL) 25 MG tablet; Take 1 tablet (25 mg total) by mouth daily.  Dispense: 90 tablet; Refill: 3 - lisinopril (PRINIVIL,ZESTRIL) 20 MG tablet; Take 1 tablet (20 mg total) by mouth daily.  Dispense: 90 tablet; Refill: 3  4. Cough  - guaiFENesin-dextromethorphan (ROBITUSSIN DM) 100-10 MG/5ML syrup; Take 5 mLs by mouth every 4 (four) hours as needed for cough.  Dispense: 240 mL; Refill: 0  5. Heart murmur  - 2D Echocardiogram with contrast; Future  Patient was extensively counseled about nutrition and exercise   Return in about 3  months (around 10/06/2013), or if symptoms worsen or fail to improve, for Hemoglobin A1C and Follow up, DM, Follow up HTN.  The patient was given clear instructions to go to ER or return to medical center if symptoms don't improve, worsen or new problems develop. The patient verbalized understanding. The patient was told to call to get lab results if they haven't heard anything in the next week.   This note has been created with Surveyor, quantity. Any transcriptional errors are unintentional.    Angelica Chessman, MD, [place], [place], [place] and [place], [place]   07/06/2013, 4:59 PM

## 2013-07-06 NOTE — Patient Instructions (Signed)
Diabetes Mellitus and Food It is important for you to manage your blood sugar (glucose) level. Your blood glucose level can be greatly affected by what you eat. Eating healthier foods in the appropriate amounts throughout the day at about the same time each day will help you control your blood glucose level. It can also help slow or prevent worsening of your diabetes mellitus. Healthy eating may even help you improve the level of your blood pressure and reach or maintain a healthy weight.  HOW CAN FOOD AFFECT ME? Carbohydrates Carbohydrates affect your blood glucose level more than any other type of food. Your dietitian will help you determine how many carbohydrates to eat at each meal and teach you how to count carbohydrates. Counting carbohydrates is important to keep your blood glucose at a healthy level, especially if you are using insulin or taking certain medicines for diabetes mellitus. Alcohol Alcohol can cause sudden decreases in blood glucose (hypoglycemia), especially if you use insulin or take certain medicines for diabetes mellitus. Hypoglycemia can be a life-threatening condition. Symptoms of hypoglycemia (sleepiness, dizziness, and disorientation) are similar to symptoms of having too much alcohol.  If your health care provider has given you approval to drink alcohol, do so in moderation and use the following guidelines:  Women should not have more than one drink per day, and men should not have more than two drinks per day. One drink is equal to:  12 oz of beer.  5 oz of wine.  1 oz of hard liquor.  Do not drink on an empty stomach.  Keep yourself hydrated. Have water, diet soda, or unsweetened iced tea.  Regular soda, juice, and other mixers might contain a lot of carbohydrates and should be counted. WHAT FOODS ARE NOT RECOMMENDED? As you make food choices, it is important to remember that all foods are not the same. Some foods have fewer nutrients per serving than other  foods, even though they might have the same number of calories or carbohydrates. It is difficult to get your body what it needs when you eat foods with fewer nutrients. Examples of foods that you should avoid that are high in calories and carbohydrates but low in nutrients include:  Trans fats (most processed foods list trans fats on the Nutrition Facts label).  Regular soda.  Juice.  Candy.  Sweets, such as cake, pie, doughnuts, and cookies.  Fried foods. WHAT FOODS CAN I EAT? Have nutrient-rich foods, which will nourish your body and keep you healthy. The food you should eat also will depend on several factors, including:  The calories you need.  The medicines you take.  Your weight.  Your blood glucose level.  Your blood pressure level.  Your cholesterol level. You also should eat a variety of foods, including:  Protein, such as meat, poultry, fish, tofu, nuts, and seeds (lean animal proteins are best).  Fruits.  Vegetables.  Dairy products, such as milk, cheese, and yogurt (low fat is best).  Breads, grains, pasta, cereal, rice, and beans.  Fats such as olive oil, trans fat-free margarine, canola oil, avocado, and olives. DOES EVERYONE WITH DIABETES MELLITUS HAVE THE SAME MEAL PLAN? Because every person with diabetes mellitus is different, there is not one meal plan that works for everyone. It is very important that you meet with a dietitian who will help you create a meal plan that is just right for you. Document Released: 09/25/2004 Document Revised: 01/03/2013 Document Reviewed: 11/25/2012 ExitCare Patient Information 2015 [employer]. This   information is not intended to replace advice given to you by your health care provider. Make sure you discuss any questions you have with your health care provider. Diabetes and Exercise Exercising regularly is important. It is not just about losing weight. It has many health benefits, such as:  Improving your overall  fitness, flexibility, and endurance.  Increasing your bone density.  Helping with weight control.  Decreasing your body fat.  Increasing your muscle strength.  Reducing stress and tension.  Improving your overall health. People with diabetes who exercise gain additional benefits because exercise:  Reduces appetite.  Improves the body's use of blood sugar (glucose).  Helps lower or control blood glucose.  Decreases blood pressure.  Helps control blood lipids (such as cholesterol and triglycerides).  Improves the body's use of the hormone insulin by:  Increasing the body's insulin sensitivity.  Reducing the body's insulin needs.  Decreases the risk for heart disease because exercising:  Lowers cholesterol and triglycerides levels.  Increases the levels of good cholesterol (such as high-density lipoproteins [HDL]) in the body.  Lowers blood glucose levels. YOUR ACTIVITY PLAN  Choose an activity that you enjoy and set realistic goals. Your health care provider or diabetes educator can help you make an activity plan that works for you. You can break activities into 2 or 3 sessions throughout the day. Doing so is as good as one long session. Exercise ideas include:  Taking the dog for a walk.  Taking the stairs instead of the elevator.  Dancing to your favorite song.  Doing your favorite exercise with a friend. RECOMMENDATIONS FOR EXERCISING WITH TYPE 1 OR TYPE 2 DIABETES   Check your blood glucose before exercising. If blood glucose levels are greater than 240 mg/dL, check for urine ketones. Do not exercise if ketones are present.  Avoid injecting insulin into areas of the body that are going to be exercised. For example, avoid injecting insulin into:  The arms when playing tennis.  The legs when jogging.  Keep a record of:  Food intake before and after you exercise.  Expected peak times of insulin action.  Blood glucose levels before and after you  exercise.  The type and amount of exercise you have done.  Review your records with your health care provider. Your health care provider will help you to develop guidelines for adjusting food intake and insulin amounts before and after exercising.  If you take insulin or oral hypoglycemic agents, watch for signs and symptoms of hypoglycemia. They include:  Dizziness.  Shaking.  Sweating.  Chills.  Confusion.  Drink plenty of water while you exercise to prevent dehydration or heat stroke. Body water is lost during exercise and must be replaced.  Talk to your health care provider before starting an exercise program to make sure it is safe for you. Remember, almost any type of activity is better than none. Document Released: 03/21/2003 Document Revised: 08/31/2012 Document Reviewed: 06/07/2012 ExitCare Patient Information 2015 [employer]. This information is not intended to replace advice given to you by your health care provider. Make sure you discuss any questions you have with your health care provider.  

## 2013-07-14 ENCOUNTER — Ambulatory Visit (HOSPITAL_COMMUNITY): Payer: PRIVATE HEALTH INSURANCE

## 2013-07-20 ENCOUNTER — Ambulatory Visit (HOSPITAL_COMMUNITY)
Admission: RE | Admit: 2013-07-20 | Discharge: 2013-07-20 | Disposition: A | Discharge status: Home / Self Care | Payer: No Typology Code available for payment source | Source: Ambulatory Visit | Attending: Internal Medicine | Admitting: Internal Medicine

## 2013-07-20 ENCOUNTER — Other Ambulatory Visit (HOSPITAL_COMMUNITY): Payer: Self-pay | Admitting: Internal Medicine

## 2013-07-20 DIAGNOSIS — I359 Nonrheumatic aortic valve disorder, unspecified: Secondary | ICD-10-CM

## 2013-07-20 DIAGNOSIS — R059 Cough, unspecified: Secondary | ICD-10-CM | POA: Insufficient documentation

## 2013-07-20 DIAGNOSIS — R7309 Other abnormal glucose: Secondary | ICD-10-CM | POA: Insufficient documentation

## 2013-07-20 DIAGNOSIS — R05 Cough: Secondary | ICD-10-CM | POA: Insufficient documentation

## 2013-07-20 DIAGNOSIS — R011 Cardiac murmur, unspecified: Secondary | ICD-10-CM

## 2013-07-20 DIAGNOSIS — I517 Cardiomegaly: Secondary | ICD-10-CM

## 2013-07-20 DIAGNOSIS — E119 Type 2 diabetes mellitus without complications: Secondary | ICD-10-CM | POA: Insufficient documentation

## 2013-07-20 DIAGNOSIS — I10 Essential (primary) hypertension: Secondary | ICD-10-CM | POA: Insufficient documentation

## 2013-07-20 NOTE — Progress Notes (Signed)
*  PRELIMINARY RESULTS* Echocardiogram 2D Echocardiogram has been performed.  Aimee Parker, [name] 07/20/2013, 4:31 PM

## 2013-09-06 ENCOUNTER — Other Ambulatory Visit: Payer: Self-pay | Admitting: Emergency Medicine

## 2013-09-06 DIAGNOSIS — E781 Pure hyperglyceridemia: Secondary | ICD-10-CM

## 2013-09-06 DIAGNOSIS — E119 Type 2 diabetes mellitus without complications: Secondary | ICD-10-CM

## 2013-09-06 MED ORDER — "INSULIN SYRINGE 28G X 1/2"" 1 ML MISC": Status: DC

## 2013-09-06 MED ORDER — FENOFIBRATE 160 MG PO TABS: 160.0000 mg | ORAL_TABLET | Freq: Every day | ORAL | Status: DC

## 2013-09-11 ENCOUNTER — Ambulatory Visit: Payer: Self-pay

## 2013-10-09 ENCOUNTER — Ambulatory Visit: Payer: Self-pay | Admitting: Internal Medicine

## 2013-12-14 ENCOUNTER — Encounter: Payer: Self-pay | Admitting: Internal Medicine

## 2013-12-14 ENCOUNTER — Ambulatory Visit: Payer: Self-pay | Attending: Internal Medicine | Admitting: Internal Medicine

## 2013-12-14 VITALS — BP 189/82 | HR 73 | Temp 98.3°F | Resp 16 | Ht 62.0 in | Wt 140.0 lb

## 2013-12-14 DIAGNOSIS — Z79899 Other long term (current) drug therapy: Secondary | ICD-10-CM | POA: Insufficient documentation

## 2013-12-14 DIAGNOSIS — E119 Type 2 diabetes mellitus without complications: Secondary | ICD-10-CM

## 2013-12-14 DIAGNOSIS — R011 Cardiac murmur, unspecified: Secondary | ICD-10-CM

## 2013-12-14 DIAGNOSIS — Z9114 Patient's other noncompliance with medication regimen: Secondary | ICD-10-CM | POA: Insufficient documentation

## 2013-12-14 DIAGNOSIS — Z794 Long term (current) use of insulin: Secondary | ICD-10-CM | POA: Insufficient documentation

## 2013-12-14 DIAGNOSIS — I10 Essential (primary) hypertension: Secondary | ICD-10-CM

## 2013-12-14 LAB — COMPLETE METABOLIC PANEL WITH GFR
ALT: 61 U/L — AB (ref 0–35)
AST: 88 U/L — AB (ref 0–37)
Albumin: 4.4 g/dL (ref 3.5–5.2)
Alkaline Phosphatase: 52 U/L (ref 39–117)
BUN: 16 mg/dL (ref 6–23)
CALCIUM: 10.1 mg/dL (ref 8.4–10.5)
CHLORIDE: 99 meq/L (ref 96–112)
CO2: 27 meq/L (ref 19–32)
Creat: 0.84 mg/dL (ref 0.50–1.10)
GFR, Est African American: 86 mL/min
GFR, Est Non African American: 74 mL/min
Glucose, Bld: 234 mg/dL — ABNORMAL HIGH (ref 70–99)
Potassium: 4.8 mEq/L (ref 3.5–5.3)
Sodium: 138 mEq/L (ref 135–145)
Total Bilirubin: 0.4 mg/dL (ref 0.2–1.2)
Total Protein: 8.2 g/dL (ref 6.0–8.3)

## 2013-12-14 LAB — LIPID PANEL
Cholesterol: 227 mg/dL — ABNORMAL HIGH (ref 0–200)
HDL: 64 mg/dL (ref >39)
LDL Cholesterol: 125 mg/dL — ABNORMAL HIGH (ref 0–99)
TRIGLYCERIDES: 189 mg/dL — AB (ref <150)
Total CHOL/HDL Ratio: 3.5 Ratio
VLDL: 38 mg/dL (ref 0–40)

## 2013-12-14 LAB — GLUCOSE, POCT (MANUAL RESULT ENTRY): POC Glucose: 239 mg/dl — AB (ref 70–99)

## 2013-12-14 LAB — POCT GLYCOSYLATED HEMOGLOBIN (HGB A1C): Hemoglobin A1C: 9.5

## 2013-12-14 MED ORDER — INSULIN NPH ISOPHANE & REGULAR (70-30) 100 UNIT/ML ~~LOC~~ SUSP: 15.0000 [IU] | Freq: Two times a day (BID) | SUBCUTANEOUS | Status: DC

## 2013-12-14 MED ORDER — METFORMIN HCL 1000 MG PO TABS: 1000.0000 mg | ORAL_TABLET | Freq: Two times a day (BID) | ORAL | Status: DC

## 2013-12-14 NOTE — Progress Notes (Signed)
Patient ID: Aimee Parker, female   DOB: Jun 01, 1950, 63 y.o.   MRN: [id number]   Aimee Parker, is a 63 y.o. female  SAY:[id number]  ATF:[id number]  DOB - Sep 03, 1950  Chief Complaint  Patient presents with  . Follow-up        Subjective:   Aimee Parker is a 63 y.o. female here today for a follow up visit. Patient has history of hypertension, diabetes here today for her routine follow-up of diabetes. She missed her last appointment. She has not been taking her medication as prescribed, partly because she could not afford a refill of her blood pressure medicine, and she does not have insulin. She currently takes metformin 850 mg tablet by mouth twice a day. She has no significant complaint today except her blood pressure is high. She does not smoke cigarettes, she does not drink alcohol. She reports no side effects to medication. Patient has No headache, No chest pain, No abdominal pain - No Nausea, No new weakness tingling or numbness, No Cough - SOB.  Problem  Heart Murmur    ALLERGIES: No Known Allergies  PAST MEDICAL HISTORY: Past Medical History  Diagnosis Date  . Hypertension     MEDICATIONS AT HOME: Prior to Admission medications   Medication Sig Start Date End Date Taking? Authorizing Provider  fenofibrate (TRICOR) 160 MG tablet Take 1 tablet (160 mg total) by mouth daily. 09/06/13   Tresa Garter, MD  glucose blood test strip Use as instructed 07/06/13   Tresa Garter, MD  glucose monitoring kit (FREESTYLE) monitoring kit 1 each by Does not apply route as needed for other. 07/06/13   Tresa Garter, MD  guaiFENesin-dextromethorphan (ROBITUSSIN DM) 100-10 MG/5ML syrup Take 5 mLs by mouth every 4 (four) hours as needed for cough. 07/06/13   Tresa Garter, MD  hydrochlorothiazide (HYDRODIURIL) 25 MG tablet Take 1 tablet (25 mg total) by mouth daily. 07/06/13   Tresa Garter, MD  insulin NPH-regular Human (NOVOLIN 70/30) (70-30) 100 UNIT/ML injection Inject  15 Units into the skin 2 (two) times daily with a meal. 12/14/13   Tresa Garter, MD  Insulin Syringe-Needle U-100 (INSULIN SYRINGE 1CC/28G) 28G X 1/2" 1 ML MISC 300 09/06/13   Sultana Tierney E Doreene Burke, MD  lisinopril (PRINIVIL,ZESTRIL) 20 MG tablet Take 1 tablet (20 mg total) by mouth daily. 07/06/13   Tresa Garter, MD  meclizine (ANTIVERT) 25 MG tablet Take 1 tablet (25 mg total) by mouth 3 (three) times daily as needed for dizziness. 11/30/12   [name], NP  metFORMIN (GLUCOPHAGE) 1000 MG tablet Take 1 tablet (1,000 mg total) by mouth 2 (two) times daily with a meal. 07/06/13   Tresa Garter, MD  ondansetron (ZOFRAN) 4 MG tablet Take 1 tablet (4 mg total) by mouth every 6 (six) hours. 11/30/12   [name], NP  promethazine (PHENERGAN) 25 MG tablet Take 0.5-1 tablets (12.5-25 mg total) by mouth every 6 (six) hours as needed for nausea or vomiting. 11/17/12   Gregor Hams, MD  traZODone (DESYREL) 50 MG tablet Take 0.5-1 tablets (25-50 mg total) by mouth at bedtime as needed for sleep. 07/06/13   Tresa Garter, MD  Vitamin D, Ergocalciferol, (DRISDOL) 50000 UNITS CAPS capsule Take 1 capsule (50,000 Units total) by mouth every 7 (seven) days. 04/04/13   Tresa Garter, MD     Objective:   Filed Vitals:   12/14/13 1128  BP: 189/82  Pulse: 73  Temp: 98.3 F (36.8  C)  TempSrc: Oral  Resp: 16  Height: 5' 2"  (1.575 m)  Weight: 140 lb (63.504 kg)  SpO2: 97%    Exam General appearance : Awake, alert, not in any distress. Speech Clear. Not toxic looking HEENT: Atraumatic and Normocephalic, pupils equally reactive to light and accomodation Neck: supple, no JVD. No cervical lymphadenopathy.  Chest:Good air entry bilaterally, no added sounds  CVS: S1 S2 regular, no murmurs.  Abdomen: Bowel sounds present, Non tender and not distended with no gaurding, rigidity or rebound. Extremities: B/L Lower Ext shows no edema, both legs are warm to touch Neurology: Awake alert, and  oriented X 3, CN II-XII intact, Non focal Skin:No Rash Wounds:N/A  Data Review Lab Results  Component Value Date   HGBA1C 9.5 12/14/2013   HGBA1C 8.0 07/06/2013   HGBA1C 13.1 02/02/2013     Assessment & Plan   1. Type 2 diabetes mellitus without complication  - Glucose (CBG) - HgB A1c has gone up to 9.5% today from 8.0% 6 months ago  - CBC with Differential - COMPLETE METABOLIC PANEL WITH GFR - Lipid panel - Urinalysis, Complete  - insulin NPH-regular Human (NOVOLIN 70/30) (70-30) 100 UNIT/ML injection; Inject 15 Units into the skin 2 (two) times daily with a meal.  Dispense: 3 vial; Refill: 3  -Increase metformin to 1000 mg tablet by mouth twice a day - Patient extensively counseled on diabetic diet and physical exercise   Aim for 2-3 Carb Choices per meal (30-45 grams) +/- 1 either way  Aim for 0-15 Carbs per snack if hungry  Include protein in moderation with your meals and snacks  Consider reading food labels for Total Carbohydrate and Fat Grams of foods  Consider checking BG at alternate times per day  Continue taking medication as directed Fruit Punch - find one with no sugar  Measure and decrease portions of carbohydrate foods  Make your plate and don't go back for seconds   2. Essential hypertension: Uncontrolled because of noncompliance with medications Patient has not taken her medications recently because she ran out and could not afford to refill. Continue current regimen Dash diet emphasized We discussed blood pressure goal and it needs to be compliant with medications  3. Heart murmur Stable   Patient was extensively counseled on nutrition and exercise.   Return in about 3 months (around 03/15/2014) for Hemoglobin A1C and Follow up, DM, Follow up HTN.  The patient was given clear instructions to go to ER or return to medical center if symptoms don't improve, worsen or new problems develop. The patient verbalized understanding. The patient was told  to call to get lab results if they haven't heard anything in the next week.   This note has been created with Surveyor, quantity. Any transcriptional errors are unintentional.    Angelica Chessman, MD, [place], [place], [place], [place]   12/14/2013, 12:09 PM

## 2013-12-14 NOTE — Patient Instructions (Signed)
B?nh ti?u ???ng tp 2 (Type 2 Diabetes Mellitus) B?nh ti?u ???ng tp 2, th??ng g?i ??n gi?n l ti?u ???ng tp 2, l m?t b?nh ko di (m?n tnh). Trong ti?u ???ng tp 2, tuy?n t?y khng s?n xu?t ?? insulin (hocmon), cc t? bo t ?p ?ng v?i insulin lm cho ( khng insulin), ho?c c? hai. Thng th??ng, insulin v?n chuy?n ???ng t? th?c ?n vo cc t? bo ? m. Cc t? bo ? m s? d?ng ???ng ?? s?n sinh ra n?ng l??ng. Thi?u h?t insulin ho?c khng ?p ?ng bnh th??ng v?i insulin gy ra l??ng ???ng d? th?a tch t? trong mu thay v ?i vo cc t? bo ? m. K?t qu? l, lm cho l??ng ???ng trong mu cao (t?ng ???ng huy?t). ?nh h??ng c?a hm l??ng ???ng (glucose) cao c th? gy ra nhi?u bi?n ch?ng.  B?nh ti?u ???ng tp 2 tr??c ?y cn ???c g?i l b?nh ti?u ???ng kh?i pht ? ng??i l?n, nh?ng n c th? x?y ra ? b?t c? l?a tu?i no.  CC Y?U T? NGUY C?  M?t ng??i d? b? b?nh ti?u ???ng tp 2 n?u c ai ? trong gia ?nh b? b?nh ny, ??ng th?i c m?t ho?c nhi?u y?u t? nguy c? chnh sau ?y:  Th?a cn.  L?i s?ng t ho?t ??ng.  Ti?n s? lin t?c ?n th?c ?n nhi?u n?ng l??ng. Duy tr cn n?ng bnh th??ng v ho?t ??ng thn th? th??ng xuyn c th? lm gi?m nguy c? pht tri?n b?nh ti?u ???ng tp 2. TRI?U CH?NG  Ban ??u, m?t ng??i b? b?nh ti?u ???ng tp 2 c th? khng c cc tri?u ch?ng. Cc tri?u ch?ng c?a b?nh ti?u ???ng tp 2 xu?t hi?n t? t?. Cc tri?u ch?ng bao g?m:  Kht n??c nhi?u (ch?ng kht nhi?u).  Ti?u ti?n nhi?u (?a ni?u).  ?i ti?u nhi?u vo ban ?m (ti?u ?m).  S?t cn. Hi?n t??ng gi?m cn ny c th? r?t nhanh.  Th??ng xuyn b? nhi?m trng ti pht.  M?t m?i (m?t)  Y?u.  Thay ??i th? l?c, ch?ng h?n nh? nhn m?.  Mi tri cy trong h?i th? c?a qu v?.  ?au b?ng.  Bu?n nn ho?c nn m?a.  V?t c?t ho?c v?t b?m tm lu lnh.  ?au bu?t ho?c t ? bn tay ho?c bn chn. CH?N ?ON B?nh ti?u ???ng tp 2 th??ng khng ???c ch?n ?on cho ??n khi xu?t hi?n cc bi?n ch?ng c?a b?nh ti?u ???ng. B?nh ti?u  ???ng tp 2 ???c ch?n ?on khi xu?t hi?n cc tri?u ch?ng c?ng nh? bi?n ch?ng v khi l??ng ???ng huy?t t?ng. L??ng ???ng huy?t c th? ???c ki?m tra b?ng m?t ho?c nhi?u xt nghi?m mu sau ?y:  Xt nghi?m ???ng huy?t lc ?i. Qu v? s? khng ???c php ?n trong t nh?t l 8 ti?ng tr??c khi l?y m?u mu.  Xt nghi?m ???ng huy?t ng?u nhin. ???ng huy?t ???c xt nghi?m b?t k? lc no trong ngy, b?t k? qu v? ?n lc no.  Xt nghi?m ???ng huy?t A1c hemoglobin. Xt nghi?m A1c hemoglobin cung c?p thng tin v? vi?c ki?m sot ???ng huy?t trong 3 thng tr??c ?.  Xt nghi?m dung n?p glucose theo ???ng u?ng (OGTT). ???ng huy?t c?a qu v? ???c ?o sau khi qu v? ch?a ?n (nh?n ?n) trong 2 gi? v sau ? l sau khi qu v? u?ng ?? u?ng c ch?a glucose. ?I?U TR?   Qu v? c th? c?n dng insulin ho?c thu?c tr? ti?u ???ng hng ngy ??  gi? cho l??ng ???ng huy?t trong ph?m vi mong mu?n.  N?u qu v? dng insulin, qu v? c th? c?n ?i?u ch?nh li?u thu?c ty thu?c vo l??ng carbohydrate m qu v? ?n trong m?i b?a ?n chnh ho?c b?a ?n nh?. M?c tiu ?i?u tr? l ?? duy tr l??ng ???ng trong mu tr??c b?a ?n (glucose tr??c ?n) ? m?c 70-130 mg/dL. H??NG D?N CH?M [place] T?I NH   L??ng A1c hemoglobin c?a qu v? ???c ki?m tra hai l?n m?i n?m.  Th?c hi?n vi?c theo di ???ng huy?t hng ngy theo ch? d?n c?a chuyn gia ch?m [place] s?c kh?e.  Theo di [place] trong n??c ti?u khi qu v? b? b?nh v theo ch? d?n c?a chuyn gia ch?m [place] s?c kh?e.  S? d?ng thu?c tr? ti?u ???ng ho?c insulin theo ch? d?n c?a chuyn gia ch?m [place] s?c kh?e ?? duy tr l??ng ???ng huy?t trong ph?m vi mong mu?n.  Khng bao gi? ?? h?t thu?c tr? ti?u ???ng ho?c insulin. Thu?c c?n ph?i dng hng ngy.  N?u qu v? ?ang dng insulin, qu v? c th? ?i?u ch?nh l??ng insulin d?a vo l??ng carbohydrates qu v? ?n. Carbohydrate c th? lm t?ng l??ng ???ng huy?t nh?ng c?n ph?i bao g?m trong ch? ?? ?n u?ng c?a qu v?. Carbohydrate cung c?p vitamin, khong ch?t v ch?t x?, l  m?t ph?n thi?t y?u c?a ch? ?? ?n u?ng c l?i cho s?c kh?e. Carbohydrate ???c tm th?y trong tri cy, rau, ng? c?c, cc s?n ph?m t? s?a, cc lo?i ??u v cc lo?i th?c ph?m c b? sung thm ???ng.  ?n th?c ?n c l?i cho s?c kh?e. Qu v? c?n h?n g?p m?t chuyn gia dinh d??ng c ??ng k hnh ngh? ?? gip qu v? ??a ra m?t k? ho?ch ?n u?ng ph h?p.  Gi?m cn n?u qu v? th?a cn.  Mang theo th? c?nh bo y t? ho?c ?eo ?? trang s?c c c?nh bo y t?.  Mang theo ?? ?n nh? ch?a 15 gam carbohydrate m?i lc ?? ?i?u tr? h? ???ng huy?t (h? ???ng huy?t). M?t s? v d? v? ?? ?n nh? ch?a 15 gam carbohydrate bao g?m:  Vin glucose, 3 ho?c 4.  Gel glucose, ?ng 15 gam.  Nho kh, 2 mu?ng (24 gam).  Th?ch hnh h?t ??u, 6.  Bnh quy hnh con gi?ng, 8.  N??c u?ng c ga thng th??ng, 4 aox? (120 ml)  K?o chp chp, 9.  Nh?n bi?t h? ???ng huy?t. H? ???ng huy?t x?y ra khi l??ng ???ng huy?t t? 70 mg/dL tr? xu?ng. Nguy c? h? ???ng huy?t gia t?ng khi nh?n ?n ho?c b? b?a, trong v sau khi t?p th? d?c c??ng ?? cao v trong khi ng?. Cc tri?u ch?ng h? ???ng huy?t c th? bao g?m:  Run ho?c l?c.  Gi?m kh? n?ng t?p trung.  ?? m? hi.  Nh?p tim t?ng.  ?au ??u.  Kh mi?ng.  ?i.  D? b? kch thch.  Lo u.  Ng? khng yn.  Thay ??i l?i ni ho?c s? ph?i h?p.  B? l l?n.  ?i?u tr? h? ???ng huy?t k?p th?i. N?u qu v? t?nh to v c th? nu?t m?t cch an ton, hy theo quy t?c 15:15:  Dng 15-20 gam glucose ho?c carbohydrate c tc d?ng nhanh. L?a ch?n tc ??ng nhanh bao g?m gel glucose, vin glucose ho?c 4 aox? (120 ml) n??c p tri cy, soda bnh th??ng ho?c s?a t bo.  Ki?m tra l??ng ???ng huy?t c?a qu v?  15 pht sau khi u?ng glucose.  Dng t? 15-20 gam glucose tr? ln n?u l??ng ???ng huy?t ???c ?o l?i v?n ? m?c 70 mg/dL tr? xu?ng.  ?n theo b?a ?n bnh th??ng ho?c ?? ?n nh? trong vng 1 ti?ng sau khi l??ng ???ng huy?t tr? l?i bnh th??ng.  Hy c?nh gic v?i c?m gic r?t kht v ?i ti?u ti?n nhi?u  l?n h?n bnh th??ng v ?y l nh?ng d?u hi?u s?m c?a t?ng ???ng huy?t. Vi?c pht hi?n t?ng ???ng huy?t s?m cho php ?i?u tr? k?p th?i. ?i?u tr? t?ng ???ng huy?t theo ch? d?n c?a chuyn gia ch?m [place] s?c kh?e.  M?i tu?n tham gia vo t nh?t 150 pht ho?t ??ng thn th? v?i c??ng ?? trung bnh, phn b? trong t nh?t 3 ngy trong tu?n ho?c theo ch? d?n c?a chuyn gia ch?m [place] s?c kh?e. Ngoi ra, qu v? nn tham gia vo bi t?p c s?c c?n t nh?t 2 l?n m?t tu?n ho?c theo ch? d?n c?a chuyn gia ch?m [place] s?c kh?e. C? g?ng dnh khng qu 90 pht m?i l?n khng ho?t ??ng.  ?i?u ch?nh thu?c v l??ng th?c ?n khi c?n n?u qu v? b?t ??u m?t bi t?p ho?c m?t mn th? thao m?i.  Lm theo k? ho?ch trong ngy b? b?nh c?a qu v? b?t c? lc no m qu v? khng th? ?n ho?c u?ng nh? bnh th??ng.  Khng s? d?ng cc s?n ph?m thu?c l bao g?m thu?c l ht, thu?c l d?ng nhai ho?c thu?c l ?i?n t?. N?u qu v? c?n gip ?? ?? cai thu?c, hy h?i chuyn gia ch?m [place] s?c kh?e.  Gi?i h?n l??ng r??u qu v? u?ng khng qu 1 ly m?i ngy v?i ph? n? khng mang thai v 2 ly m?i ngy v?i nam gi?i. Qu v? ch? nn u?ng r??u khi ?n. Ni chuy?n v?i chuyn gia ch?m [place] s?c kh?e xem u?ng r??u c an ton cho qu v? hay khng. Cho chuyn gia ch?m [place] s?c kh?e bi?t n?u qu v? u?ng r??u vi l?n m?i tu?n.  Tun th? m?i cu?c h?n khm l?i theo ch? d?n c?a chuyn gia ch?m [place] s?c kh?e. ?i?u ny l quan tr?ng.  S?p x?p bu?i khm m?t ngay sau khi ch?n ?on b?nh ti?u ???ng tp 2 v sau ? l hng n?m.  Th?c hi?n ch?m [place] da v bn chn hng ngy. Ki?m tra da v bn chn hng ngy xem c v?t c?t, v?t b?m tm, t?y ??, v?n ?? v? mng, ch?y mu, m?n n??c hay l? lot khng. Bn chn c?n ???c chuyn gia ch?m [place] s?c kh?e khm hng n?m.  ?nh r?ng v l?i t nh?t hai l?n m?i ngy v dng ch? nha khoa t nh?t m?t l?n m?i ngy. G?p nha s? ?? khm l?i th??ng xuyn.  Chia s? k? ho?ch qu?n l b?nh ti?u ???ng c?a qu v? ? n?i lm vi?c ho?c tr??ng h?c c?a qu  v?.  Lun c?p nh?t vi?c tim ch?ng. Ng??i b? b?nh ti?u ???ng trn 65 tu?i ???c khuy?n ngh? tim v?cxin vim ph?i. Trong m?t s? tr??ng h?p, qu v? c th? ???c tim hai m?i khc nhau. H?i chuyn gia ch?m [place] s?c kh?e xem vi?c tim phng vim ph?i c?a qu v? c c?p nh?t khng.  H?c cch qu?n l c?ng th?ng.  Xin ???c gio d?c v h? tr? v? b?nh ti?u ???ng th??ng xuyn khi c?n.  Tham gia ho?c tm cch ph?c h?i ch?c n?ng khi c?n thi?t ??  duy tr ho?c c?i thi?n kh? n?ng ??c l?p v ch?t l??ng cu?c s?ng. Yu c?u chuy?n sang v?t l tr? li?u ho?c li?u php ngh? nghi?p n?u qu v? b? t bn chn ho?c t tay, ho?c kh ch?i ??u, kh m?c qu?n o, ?n u?ng ho?c ho?t ??ng th? ch?t. ?I KHM N?U:   Qu v? khng th? ?n ho?c u?ng trong h?n 6 ti?ng.  Qu v? b? bu?n nn v nn m?a trong h?n 6 ti?ng.  L??ng ???ng huy?t c?a qu v? cao trn 240 mg/dL.  C thay ??i tr?ng thi tinh th?n.  Qu v? b? thm m?t c?n b?nh nghim tr?ng.  Qu v? b? tiu ch?y trong h?n 6 ti?ng.  Qu v? ? b? ?m ho?c b? s?t trong m?t vi ngy v khng ?? h?n.  Qu v? b? ?au trong khi tham gia b?t k? ho?t ??ng thn th? no. NGAY L?P T?C ?I KHM N?U:  Qu v? b? kh th?.  Qu v? c l??ng ketone ? m?c trung bnh ??n cao. ??M B?O QU V?:  Hi?u r cc h??ng d?n ny.  S? theo di tnh tr?ng c?a mnh.  S? yu c?u tr? gip ngay l?p t?c n?u qu v? c?m th?y khng kh?e ho?c th?y tr?m tr?ng h?n. Document Released: 12/29/2004 Document Revised: 05/15/2013 [facility] Patient Information 2015 [place], [place]. This information is not intended to replace advice given to you by your health care provider. Make sure you discuss any questions you have with your health care provider. T?ng huy?t p (Hypertension) T?ng huy?t p, th??ng ???c g?i l huy?t p cao, l khi l?c b?m mu qua ??ng m?ch c?a qu v? qu m?nh. ??ng m?ch c?a qu v? l cc m?ch mu mang mu t? tim ?i kh?p c? th? c?a qu v?. K?t qu? ?o huy?t p c m?t con s? cao v m?t con s? th?p, ch?ng h?n  110/72. Con s? cao (tm thu) l p l?c bn trong ??ng m?ch khi tim qu v? b?m. Con s? th?p (tm tr??ng) l p l?c bn trong ??ng m?ch khi tim qu v? gin ra. Huy?t p l t??ng c?n cho qu v? ph?i l d??i 120/80. Ch?ng t?ng huy?t p bu?c tim qu v? ph?i lm vi?c v?t v? h?n ?? b?m mu. ??ng m?ch c?a qu v? c th? b? h?p ho?c c?ng. Ch?ng t?ng huy?t p lm qu v? c nguy c? b? b?nh tim, ??t qu? v cc v?n ?? khc.  CC Y?U T? NGUY C? M?t s? y?u t? nguy c? d?n ??n huy?t p cao c th? ki?m sot ???c. M?t s? y?u t? khc th khng.  Nh?ng y?u t? nguy c? khng th? ki?m sot ???c bao g?m:   Ch?ng t?c. Qu v? c nguy c? cao h?n n?u qu v? l ng??i M? g?c Phi.  ?? tu?i. Nguy c? t?ng ln theo ?? tu?i.  Gi?i tnh. Nam gi?i c nguy c? cao h?n ph? n? tr??c tu?i 45. Sau tu?i 65, ph? n? c nguy c? cao h?n nam gi?i. Nh?ng y?u t? nguy c? c th? ki?m sot ???c bao g?m:  Khng t?p th? d?c ho?c cc ho?t ??ng th? ch?t ??y ??Marland Kitchen.  Th?a cn.  ?n qu nhi?u ch?t bo, ???ng, ca-lo, ho?c mu?i.  U?ng qu nhi?u r??u. D?U HI?U V TRI?U CH?NG T?ng huy?t p th??ng khng gy ra d?u hi?u ho?c tri?u ch?ng. Huy?t p r?t cao (c?n cao huy?t p) c th? gy ?au ??u, lo l?ng, kh th?, v ch?y mu cam. CH?N ?ON  ??  ki?m tra xem qu v? c t?ng huy?t p khng, chuyn gia ch?m [place] s?c kh?e c?a qu v? s? ?o huy?t p trong khi qu v? ng?i ??t tay ? m?c ngang v?i tim. Huy?t p c?n ???c ?o t nh?t hai l?n trn cng m?t cnh tay. M?t s? tnh tr?ng nh?t ??nh c th? lm cho huy?t p khc nhau gi?a tay ph?i v tay tri c?a qu v?. K?t qu? ?o huy?t p cao h?n bnh th??ng ? m?t th?i ?i?m no ? khng c ngh?a l qu v? c?n ?i?u tr?Marland Kitchen N?u k?t qu? ?o huy?t p cao, hy h?i chuyn gia ch?m [place] s?c kh?e v? vi?c ki?m tra l?i huy?t p. ?I?U TR?  ?i?u tr? huy?t p cao gao g?m thay ??i l?i s?ng v c th? ph?i dng thu?c. C m?t l?i s?ng lnh m?nh c th? gip lm gi?m huy?t p cao. Qu v? c th? c?n thay ??i m?t s? thi quen. Thay ??i l?i s?ng c th? bao  g?m:  Th?c hi?n ch? ?? ?n DASH. Ch? ?? ?n ny c nhi?u tri cy, rau, v ng? c?c nguyn h?t. C t mu?i, th?t ??, v t b? sung ???ng.  Hy dnh 2 1/2 ti?ng ho?t ??ng thn th? nhanh m?i tu?n.  Gi?m cn n?u c?n thi?t.  Khng ht thu?c.  H?n ch? ?? u?ng c c?n.  H?c cc cch gi?m c?ng th?ng. N?u thay ??i l?i s?ng khng ?? ?? ??a huy?t p v? m?c c th? ki?m sot ???c, chuyn gia ch?m [place] s?c kh?e c th? k ??n thu?c. Qu v? c th? c?n dng nhi?u lo?i thu?c. Ph?i h?p ch?t ch? v?i chuyn gia ch?m [place] s?c kh?e ?? tm hi?u cc nguy c? v l?i ch. H??NG D?N CH?M [place] T?I NH  Ki?m tra l?i huy?t p c?a qu v? theo ch? d?n c?a chuyn gia ch?m [place] s?c kh?e.  Ch? s? d?ng thu?c theo ch? d?n c?a chuyn gia ch?m [place] s?c kh?e. Lm theo ch? d?n m?t cch c?n th?n. Thu?c ?i?u tr? huy?t p ph?i ???c dng theo ??n ? k. Thu?c c?ng s? khng c tc d?ng khi qu v? b? li?u. Vi?c b? li?u thu?c c?ng lm qu v? c nguy c? pht sinh v?n ??Imagene Sheller ht thu?c.  Theo di huy?t p c?a qu v? ? nh theo ch? d?n c?a chuyn gia ch?m [place] s?c kh?e. ?I KHM N?U:   Qu v? ngh? qu v? c ph?n ?ng v?i thu?c ?ang dng.  Qu v? b? ?au ??u ho?c c?m th?y chng m?t ti di?n.  Qu v? b? s?ng ph ? m?t c chn.  Qu v? c v?n ?? v? th? l?c. NGAY L?P T?C ?I KHM N?U:  Qu v? b? ?au ??u n?ng ho?c l l?n.  Qu v? b? y?u b?t th??ng, t b, ho?c c?m th?y nh? ng?t x?u.  Qu v? b? ?au ng?c ho?c ?au b?ng r?t nhi?u.  Qu v? nn nhi?u l?n.  Qu v? b? kh th?. ??M B?O QU V?:   Hi?u r cc h??ng d?n ny.  S? theo di tnh tr?ng c?a mnh.  S? yu c?u tr? gip ngay l?p t?c n?u qu v? c?m th?y khng kh?e ho?c th?y tr?m tr?ng h?n. Document Released: 12/29/2004 Document Revised: 05/15/2013 [facility] Patient Information 2015 [place], [place]. This information is not intended to replace advice given to you by your health care provider. Make sure you discuss any questions you have with your health care provider. Basic Carbohydrate  Counting for Diabetes  [place] Carbohydrate counting is a method for keeping track of the amount of carbohydrates you eat. Eating carbohydrates naturally increases the level of sugar (glucose) in your blood, so it is important for you to know the amount that is okay for you to have in every meal. Carbohydrate counting helps keep the level of glucose in your blood within normal limits. The amount of carbohydrates allowed is different for every person. A dietitian can help you calculate the amount that is right for you. Once you know the amount of carbohydrates you can have, you can count the carbohydrates in the foods you want to eat. Carbohydrates are found in the following foods:  Grains, such as breads and cereals.  Dried beans and soy products.  Starchy vegetables, such as potatoes, peas, and corn.  Fruit and fruit juices.  Milk and yogurt.  Sweets and snack foods, such as cake, cookies, candy, chips, soft drinks, and fruit drinks. CARBOHYDRATE COUNTING There are two ways to count the carbohydrates in your food. You can use either of the methods or a combination of both. Reading the "Nutrition Facts" on Packaged Food The "Nutrition Facts" is an area that is included on the labels of almost all packaged food and beverages in the [place]. It includes the serving size of that food or beverage and information about the nutrients in each serving of the food, including the grams (g) of carbohydrate per serving.  Decide the number of servings of this food or beverage that you will be able to eat or drink. Multiply that number of servings by the number of grams of carbohydrate that is listed on the label for that serving. The total will be the amount of carbohydrates you will be having when you eat or drink this food or beverage. Learning Standard Serving Sizes of Food When you eat food that is not packaged or does not include "Nutrition Facts" on the label, you need to measure the servings in  order to count the amount of carbohydrates.A serving of most carbohydrate-rich foods contains about 15 g of carbohydrates. The following list includes serving sizes of carbohydrate-rich foods that provide 15 g ofcarbohydrate per serving:   1 slice of bread (1 oz) or 1 six-inch tortilla.    of a hamburger bun or English muffin.  4-6 crackers.   cup unsweetened dry cereal.    cup hot cereal.   cup rice or pasta.    cup mashed potatoes or  of a large baked potato.  1 cup fresh fruit or one small piece of fruit.    cup canned or frozen fruit or fruit juice.  1 cup milk.   cup plain fat-free yogurt or yogurt sweetened with artificial sweeteners.   cup cooked dried beans or starchy vegetable, such as peas, corn, or potatoes.  Decide the number of standard-size servings that you will eat. Multiply that number of servings by 15 (the grams of carbohydrates in that serving). For example, if you eat 2 cups of strawberries, you will have eaten 2 servings and 30 g of carbohydrates (2 servings x 15 g = 30 g). For foods such as soups and casseroles, in which more than one food is mixed in, you will need to count the carbohydrates in each food that is included. EXAMPLE OF CARBOHYDRATE COUNTING Sample Dinner  3 oz chicken breast.   cup of brown rice.   cup of corn.  1 cup milk.   1 cup strawberries with sugar-free whipped topping.  Carbohydrate  Calculation Step 1: Identify the foods that contain carbohydrates:   Rice.   Corn.   Milk.   Strawberries. Step 2:Calculate the number of servings eaten of each:   2 servings of rice.   1 serving of corn.   1 serving of milk.   1 serving of strawberries. Step 3: Multiply each of those number of servings by 15 g:   2 servings of rice x 15 g = 30 g.   1 serving of corn x 15 g = 15 g.   1 serving of milk x 15 g = 15 g.   1 serving of strawberries x 15 g = 15 g. Step 4: Add together all of the amounts  to find the total grams of carbohydrates eaten: 30 g + 15 g + 15 g + 15 g = 75 g. Document Released: 12/29/2004 Document Revised: 05/15/2013 Document Reviewed: 11/25/2012 [facility] Patient Information 2015 [place], [place]. This information is not intended to replace advice given to you by your health care provider. Make sure you discuss any questions you have with your health care provider. Diabetes and Exercise Exercising regularly is important. It is not just about losing weight. It has many health benefits, such as:  Improving your overall fitness, flexibility, and endurance.  Increasing your bone density.  Helping with weight control.  Decreasing your body fat.  Increasing your muscle strength.  Reducing stress and tension.  Improving your overall health. People with diabetes who exercise gain additional benefits because exercise:  Reduces appetite.  Improves the body's use of blood sugar (glucose).  Helps lower or control blood glucose.  Decreases blood pressure.  Helps control blood lipids (such as cholesterol and triglycerides).  Improves the body's use of the hormone insulin by:  Increasing the body's insulin sensitivity.  Reducing the body's insulin needs.  Decreases the risk for heart disease because exercising:  Lowers cholesterol and triglycerides levels.  Increases the levels of good cholesterol (such as high-density lipoproteins [HDL]) in the body.  Lowers blood glucose levels. YOUR ACTIVITY PLAN  Choose an activity that you enjoy and set realistic goals. Your health care provider or diabetes educator can help you make an activity plan that works for you. Exercise regularly as directed by your health care provider. This includes:  Performing resistance training twice a week such as push-ups, sit-ups, lifting weights, or using resistance bands.  Performing 150 minutes of cardio exercises each week such as walking, running, or playing sports.  Staying  active and spending no more than 90 minutes at one time being inactive. Even short bursts of exercise are good for you. Three 10-minute sessions spread throughout the day are just as beneficial as a single 30-minute session. Some exercise ideas include:  Taking the dog for a walk.  Taking the stairs instead of the elevator.  Dancing to your favorite song.  Doing an exercise video.  Doing your favorite exercise with a friend. RECOMMENDATIONS FOR EXERCISING WITH TYPE 1 OR TYPE 2 DIABETES   Check your blood glucose before exercising. If blood glucose levels are greater than 240 mg/dL, check for urine ketones. Do not exercise if ketones are present.  Avoid injecting insulin into areas of the body that are going to be exercised. For example, avoid injecting insulin into:  The arms when playing tennis.  The legs when jogging.  Keep a record of:  Food intake before and after you exercise.  Expected peak times of insulin action.  Blood glucose levels before and after  you exercise.  The type and amount of exercise you have done.  Review your records with your health care provider. Your health care provider will help you to develop guidelines for adjusting food intake and insulin amounts before and after exercising.  If you take insulin or oral hypoglycemic agents, watch for signs and symptoms of hypoglycemia. They include:  Dizziness.  Shaking.  Sweating.  Chills.  Confusion.  Drink plenty of water while you exercise to prevent dehydration or heat stroke. Body water is lost during exercise and must be replaced.  Talk to your health care provider before starting an exercise program to make sure it is safe for you. Remember, almost any type of activity is better than none. Document Released: 03/21/2003 Document Revised: 05/15/2013 Document Reviewed: 06/07/2012 [facility] [address] Patient Information 2015 [place], [place]. This information is not intended to replace advice given to you by  your health care provider. Make sure you discuss any questions you have with your health care provider.

## 2013-12-14 NOTE — Progress Notes (Signed)
Pt is here today following up on her HTN and diabetes. Pt is wanting to know if her BP and BS are getting better or worse. Pt states that she is feeling dizzy.

## 2013-12-15 LAB — CBC WITH DIFFERENTIAL/PLATELET
Basophils Absolute: 0 10*3/uL (ref 0.0–0.1)
Basophils Relative: 0 % (ref 0–1)
EOS ABS: 0.2 10*3/uL (ref 0.0–0.7)
Eosinophils Relative: 2 % (ref 0–5)
HEMATOCRIT: 39.4 % (ref 36.0–46.0)
Hemoglobin: 12.8 g/dL (ref 12.0–15.0)
Lymphocytes Relative: 52 % — ABNORMAL HIGH (ref 12–46)
Lymphs Abs: 6.1 10*3/uL — ABNORMAL HIGH (ref 0.7–4.0)
MCH: 22.3 pg — ABNORMAL LOW (ref 26.0–34.0)
MCHC: 32.5 g/dL (ref 30.0–36.0)
MCV: 68.8 fL — ABNORMAL LOW (ref 78.0–100.0)
MONO ABS: 0.5 10*3/uL (ref 0.1–1.0)
MONOS PCT: 4 % (ref 3–12)
MPV: 9.4 fL (ref 9.4–12.4)
Neutro Abs: 5 10*3/uL (ref 1.7–7.7)
Neutrophils Relative %: 42 % — ABNORMAL LOW (ref 43–77)
Platelets: 355 10*3/uL (ref 150–400)
RBC: 5.73 MIL/uL — ABNORMAL HIGH (ref 3.87–5.11)
RDW: 15.2 % (ref 11.5–15.5)
WBC: 11.8 10*3/uL — ABNORMAL HIGH (ref 4.0–10.5)

## 2013-12-15 LAB — URINALYSIS, COMPLETE
Bilirubin Urine: NEGATIVE
Casts: NONE SEEN
Crystals: NONE SEEN
Glucose, UA: >1000 mg/dL — AB
Hgb urine dipstick: NEGATIVE
KETONES UR: NEGATIVE mg/dL
Leukocytes, UA: NEGATIVE
NITRITE: POSITIVE — AB
Protein, ur: NEGATIVE mg/dL
SPECIFIC GRAVITY, URINE: 1.021 (ref 1.005–1.030)
Squamous Epithelial / LPF: NONE SEEN
UROBILINOGEN UA: 0.2 mg/dL (ref 0.0–1.0)
pH: 5.5 (ref 5.0–8.0)

## 2013-12-15 LAB — PATHOLOGIST SMEAR REVIEW

## 2014-01-17 ENCOUNTER — Ambulatory Visit: Payer: Self-pay | Attending: Internal Medicine

## 2014-01-24 ENCOUNTER — Other Ambulatory Visit: Payer: Self-pay | Admitting: Internal Medicine

## 2014-01-24 DIAGNOSIS — E119 Type 2 diabetes mellitus without complications: Secondary | ICD-10-CM

## 2014-01-24 MED ORDER — INSULIN NPH ISOPHANE & REGULAR (70-30) 100 UNIT/ML ~~LOC~~ SUSP: 15.0000 [IU] | Freq: Two times a day (BID) | SUBCUTANEOUS | Status: DC

## 2014-02-12 ENCOUNTER — Ambulatory Visit: Payer: Self-pay | Attending: Internal Medicine

## 2014-05-17 ENCOUNTER — Encounter: Payer: Self-pay | Admitting: Internal Medicine

## 2014-05-17 ENCOUNTER — Ambulatory Visit: Payer: Self-pay | Attending: Internal Medicine | Admitting: Internal Medicine

## 2014-05-17 VITALS — BP 195/99 | HR 81 | Temp 98.0°F | Resp 16 | Wt 137.2 lb

## 2014-05-17 DIAGNOSIS — E781 Pure hyperglyceridemia: Secondary | ICD-10-CM | POA: Insufficient documentation

## 2014-05-17 DIAGNOSIS — E119 Type 2 diabetes mellitus without complications: Secondary | ICD-10-CM | POA: Insufficient documentation

## 2014-05-17 DIAGNOSIS — Z794 Long term (current) use of insulin: Secondary | ICD-10-CM | POA: Insufficient documentation

## 2014-05-17 DIAGNOSIS — E139 Other specified diabetes mellitus without complications: Secondary | ICD-10-CM

## 2014-05-17 DIAGNOSIS — E1110 Type 2 diabetes mellitus with ketoacidosis without coma: Secondary | ICD-10-CM | POA: Insufficient documentation

## 2014-05-17 DIAGNOSIS — I10 Essential (primary) hypertension: Secondary | ICD-10-CM | POA: Insufficient documentation

## 2014-05-17 LAB — POCT GLYCOSYLATED HEMOGLOBIN (HGB A1C): HEMOGLOBIN A1C: 9.6

## 2014-05-17 LAB — GLUCOSE, POCT (MANUAL RESULT ENTRY): POC GLUCOSE: 189 mg/dL — AB (ref 70–99)

## 2014-05-17 MED ORDER — LISINOPRIL 40 MG PO TABS: 40.0000 mg | ORAL_TABLET | Freq: Every day | ORAL | Status: DC

## 2014-05-17 MED ORDER — CLONIDINE HCL 0.1 MG PO TABS
0.1000 mg | ORAL_TABLET | Freq: Once | ORAL | Status: AC
Start: 2014-05-17 — End: 2014-05-17
  Administered 2014-05-17: 0.1 mg via ORAL

## 2014-05-17 MED ORDER — HYDROCHLOROTHIAZIDE 25 MG PO TABS: 25.0000 mg | ORAL_TABLET | Freq: Every day | ORAL | Status: DC

## 2014-05-17 MED ORDER — METFORMIN HCL 1000 MG PO TABS: 1000.0000 mg | ORAL_TABLET | Freq: Two times a day (BID) | ORAL | Status: DC

## 2014-05-17 MED ORDER — FENOFIBRATE 160 MG PO TABS: 160.0000 mg | ORAL_TABLET | Freq: Every day | ORAL | Status: DC

## 2014-05-17 MED ORDER — INSULIN NPH ISOPHANE & REGULAR (70-30) 100 UNIT/ML ~~LOC~~ SUSP: 20.0000 [IU] | Freq: Two times a day (BID) | SUBCUTANEOUS | Status: DC

## 2014-05-17 NOTE — Patient Instructions (Signed)
DASH Eating Plan DASH stands for "Dietary Approaches to Stop Hypertension." The DASH eating plan is a healthy eating plan that has been shown to reduce high blood pressure (hypertension). Additional health benefits may include reducing the risk of type 2 diabetes mellitus, heart disease, and stroke. The DASH eating plan may also help with weight loss. WHAT DO I NEED TO KNOW ABOUT THE DASH EATING PLAN? For the DASH eating plan, you will follow these general guidelines:  Choose foods with a percent daily value for sodium of less than 5% (as listed on the food label).  Use salt-free seasonings or herbs instead of table salt or sea salt.  Check with your health care provider or pharmacist before using salt substitutes.  Eat lower-sodium products, often labeled as "lower sodium" or "no salt added."  Eat fresh foods.  Eat more vegetables, fruits, and low-fat dairy products.  Choose whole grains. Look for the word "whole" as the first word in the ingredient list.  Choose fish and skinless chicken or turkey more often than red meat. Limit fish, poultry, and meat to 6 oz (170 g) each day.  Limit sweets, desserts, sugars, and sugary drinks.  Choose heart-healthy fats.  Limit cheese to 1 oz (28 g) per day.  Eat more home-cooked food and less restaurant, buffet, and fast food.  Limit fried foods.  Cook foods using methods other than frying.  Limit canned vegetables. If you do use them, rinse them well to decrease the sodium.  When eating at a restaurant, ask that your food be prepared with less salt, or no salt if possible. WHAT FOODS CAN I EAT? Seek help from a dietitian for individual calorie needs. Grains Whole grain or whole wheat bread. Brown rice. Whole grain or whole wheat pasta. Quinoa, bulgur, and whole grain cereals. Low-sodium cereals. Corn or whole wheat flour tortillas. Whole grain cornbread. Whole grain crackers. Low-sodium crackers. Vegetables Fresh or frozen vegetables  (raw, steamed, roasted, or grilled). Low-sodium or reduced-sodium tomato and vegetable juices. Low-sodium or reduced-sodium tomato sauce and paste. Low-sodium or reduced-sodium canned vegetables.  Fruits All fresh, canned (in natural juice), or frozen fruits. Meat and Other Protein Products Ground beef (85% or leaner), grass-fed beef, or beef trimmed of fat. Skinless chicken or turkey. Ground chicken or turkey. Pork trimmed of fat. All fish and seafood. Eggs. Dried beans, peas, or lentils. Unsalted nuts and seeds. Unsalted canned beans. Dairy Low-fat dairy products, such as skim or 1% milk, 2% or reduced-fat cheeses, low-fat ricotta or cottage cheese, or plain low-fat yogurt. Low-sodium or reduced-sodium cheeses. Fats and Oils Tub margarines without trans fats. Light or reduced-fat mayonnaise and salad dressings (reduced sodium). Avocado. Safflower, olive, or canola oils. Natural peanut or almond butter. Other Unsalted popcorn and pretzels. The items listed above may not be a complete list of recommended foods or beverages. Contact your dietitian for more options. WHAT FOODS ARE NOT RECOMMENDED? Grains White bread. White pasta. White rice. Refined cornbread. Bagels and croissants. Crackers that contain trans fat. Vegetables Creamed or fried vegetables. Vegetables in a cheese sauce. Regular canned vegetables. Regular canned tomato sauce and paste. Regular tomato and vegetable juices. Fruits Dried fruits. Canned fruit in light or heavy syrup. Fruit juice. Meat and Other Protein Products Fatty cuts of meat. Ribs, chicken wings, bacon, sausage, bologna, salami, chitterlings, fatback, hot dogs, bratwurst, and packaged luncheon meats. Salted nuts and seeds. Canned beans with salt. Dairy Whole or 2% milk, cream, half-and-half, and cream cheese. Whole-fat or sweetened yogurt. Full-fat   cheeses or blue cheese. Nondairy creamers and whipped toppings. Processed cheese, cheese spreads, or cheese  curds. Condiments Onion and garlic salt, seasoned salt, table salt, and sea salt. Canned and packaged gravies. Worcestershire sauce. Tartar sauce. Barbecue sauce. Teriyaki sauce. Soy sauce, including reduced sodium. Steak sauce. Fish sauce. Oyster sauce. Cocktail sauce. Horseradish. Ketchup and mustard. Meat flavorings and tenderizers. Bouillon cubes. Hot sauce. Tabasco sauce. Marinades. Taco seasonings. Relishes. Fats and Oils Butter, stick margarine, lard, shortening, ghee, and bacon fat. Coconut, palm kernel, or palm oils. Regular salad dressings. Other Pickles and olives. Salted popcorn and pretzels. The items listed above may not be a complete list of foods and beverages to avoid. Contact your dietitian for more information. WHERE CAN I FIND MORE INFORMATION? National Heart, Lung, and [employer] Institute: www.nhlbi.nih.gov/health/health-topics/topics/dash/ Document Released: 12/18/2010 Document Revised: 05/15/2013 Document Reviewed: 11/02/2012 ExitCare Patient Information 2015 [employer]. This information is not intended to replace advice given to you by your health care provider. Make sure you discuss any questions you have with your health care provider. Diabetes and Exercise Exercising regularly is important. It is not just about losing weight. It has many health benefits, such as:  Improving your overall fitness, flexibility, and endurance.  Increasing your bone density.  Helping with weight control.  Decreasing your body fat.  Increasing your muscle strength.  Reducing stress and tension.  Improving your overall health. People with diabetes who exercise gain additional benefits because exercise:  Reduces appetite.  Improves the body's use of blood sugar (glucose).  Helps lower or control blood glucose.  Decreases blood pressure.  Helps control blood lipids (such as cholesterol and triglycerides).  Improves the body's use of the hormone insulin by:  Increasing the  body's insulin sensitivity.  Reducing the body's insulin needs.  Decreases the risk for heart disease because exercising:  Lowers cholesterol and triglycerides levels.  Increases the levels of good cholesterol (such as high-density lipoproteins [HDL]) in the body.  Lowers blood glucose levels. YOUR ACTIVITY PLAN  Choose an activity that you enjoy and set realistic goals. Your health care provider or diabetes educator can help you make an activity plan that works for you. Exercise regularly as directed by your health care provider. This includes:  Performing resistance training twice a week such as push-ups, sit-ups, lifting weights, or using resistance bands.  Performing 150 minutes of cardio exercises each week such as walking, running, or playing sports.  Staying active and spending no more than 90 minutes at one time being inactive. Even short bursts of exercise are good for you. Three 10-minute sessions spread throughout the day are just as beneficial as a single 30-minute session. Some exercise ideas include:  Taking the dog for a walk.  Taking the stairs instead of the elevator.  Dancing to your favorite song.  Doing an exercise video.  Doing your favorite exercise with a friend. RECOMMENDATIONS FOR EXERCISING WITH TYPE 1 OR TYPE 2 DIABETES   Check your blood glucose before exercising. If blood glucose levels are greater than 240 mg/dL, check for urine ketones. Do not exercise if ketones are present.  Avoid injecting insulin into areas of the body that are going to be exercised. For example, avoid injecting insulin into:  The arms when playing tennis.  The legs when jogging.  Keep a record of:  Food intake before and after you exercise.  Expected peak times of insulin action.  Blood glucose levels before and after you exercise.  The type and amount of exercise   you have done.  Review your records with your health care provider. Your health care provider will  help you to develop guidelines for adjusting food intake and insulin amounts before and after exercising.  If you take insulin or oral hypoglycemic agents, watch for signs and symptoms of hypoglycemia. They include:  Dizziness.  Shaking.  Sweating.  Chills.  Confusion.  Drink plenty of water while you exercise to prevent dehydration or heat stroke. Body water is lost during exercise and must be replaced.  Talk to your health care provider before starting an exercise program to make sure it is safe for you. Remember, almost any type of activity is better than none. Document Released: 03/21/2003 Document Revised: 05/15/2013 Document Reviewed: 06/07/2012 ExitCare Patient Information 2015 [employer]. This information is not intended to replace advice given to you by your health care provider. Make sure you discuss any questions you have with your health care provider.  

## 2014-05-17 NOTE — Progress Notes (Signed)
Patient here with interpreter Patient states she is here for follow up on her diabetes and hypertension Patient presents in office with elevated blood pressure Patient did state she did take her medications today

## 2014-05-17 NOTE — Progress Notes (Signed)
Patient ID: Aimee Parker, female   DOB: [birth year], 64 y.o.   MRN: [id number]   Aimee Parker, is a 64 y.o. female  [id number]  [id number]  DOB - 1950/07/22  Chief Complaint  Patient presents with  . Follow-up        Subjective:   Aimee Parker is a 64 y.o. female here today for a follow up visit. Patient has history of hypertension and type 2 diabetes mellitus, currently on hydrochlorothiazide 25 mg tablet by mouth daily and lisinopril 20 mg tablet by mouth daily. She takes metformin 1000 mg tablet by mouth twice a day and insulin NPH 70/30 15 units subcutaneous injection twice a day. Patient claims compliant with medications but blood pressure remains uncontrolled. Her hemoglobin A1c has been between 9 and 10 consistently although it was 13.1% one year ago. Patient has no new complaint today. She is here for medication refill. Patient does not smoke cigarettes, she does not drink alcohol. Patient has No headache, No chest pain, No abdominal pain - No Nausea, No new weakness tingling or numbness, No Cough - SOB.  Problem  Other Specified Diabetes Mellitus Without Complications    ALLERGIES: No Known Allergies  PAST MEDICAL HISTORY: Past Medical History  Diagnosis Date  . Hypertension     MEDICATIONS AT HOME: Prior to Admission medications   Medication Sig Start Date End Date Taking? Authorizing Provider  fenofibrate 160 MG tablet Take 1 tablet (160 mg total) by mouth daily. 05/17/14   Tresa Garter, MD  glucose blood test strip Use as instructed 07/06/13   Tresa Garter, MD  glucose monitoring kit (FREESTYLE) monitoring kit 1 each by Does not apply route as needed for other. 07/06/13   Tresa Garter, MD  guaiFENesin-dextromethorphan (ROBITUSSIN DM) 100-10 MG/5ML syrup Take 5 mLs by mouth every 4 (four) hours as needed for cough. 07/06/13   Tresa Garter, MD  hydrochlorothiazide (HYDRODIURIL) 25 MG tablet Take 1 tablet (25 mg total) by mouth daily. 05/17/14    Tresa Garter, MD  insulin NPH-regular Human (NOVOLIN 70/30) (70-30) 100 UNIT/ML injection Inject 20 Units into the skin 2 (two) times daily with a meal. 05/17/14   Tresa Garter, MD  Insulin Syringe-Needle U-100 (INSULIN SYRINGE 1CC/28G) 28G X 1/2" 1 ML MISC 300 09/06/13   Tresa Garter, MD  lisinopril (PRINIVIL,ZESTRIL) 40 MG tablet Take 1 tablet (40 mg total) by mouth daily. 05/17/14   Tresa Garter, MD  meclizine (ANTIVERT) 25 MG tablet Take 1 tablet (25 mg total) by mouth 3 (three) times daily as needed for dizziness. 11/30/12   [name], NP  metFORMIN (GLUCOPHAGE) 1000 MG tablet Take 1 tablet (1,000 mg total) by mouth 2 (two) times daily with a meal. 05/17/14   Tresa Garter, MD  ondansetron (ZOFRAN) 4 MG tablet Take 1 tablet (4 mg total) by mouth every 6 (six) hours. 11/30/12   [name], NP  promethazine (PHENERGAN) 25 MG tablet Take 0.5-1 tablets (12.5-25 mg total) by mouth every 6 (six) hours as needed for nausea or vomiting. 11/17/12   Gregor Hams, MD  traZODone (DESYREL) 50 MG tablet Take 0.5-1 tablets (25-50 mg total) by mouth at bedtime as needed for sleep. 07/06/13   Tresa Garter, MD  Vitamin D, Ergocalciferol, (DRISDOL) 50000 UNITS CAPS capsule Take 1 capsule (50,000 Units total) by mouth every 7 (seven) days. 04/04/13   Tresa Garter, MD     Objective:   Filed Vitals:  05/17/14 1129 05/17/14 1130  BP: 199/95 195/99  Pulse: 80 81  Temp: 98 F (36.7 C)   Resp: 16   Weight: 137 lb 3.2 oz (62.234 kg)   SpO2: 100% 100%    Exam General appearance : Awake, alert, not in any distress. Speech Clear. Not toxic looking HEENT: Atraumatic and Normocephalic, pupils equally reactive to light and accomodation Neck: supple, no JVD. No cervical lymphadenopathy.  Chest:Good air entry bilaterally, no added sounds  CVS: S1 S2 regular, Systolic heart murmurs loudest at the aortic area.  Abdomen: Bowel sounds present, Non tender and not distended with  no gaurding, rigidity or rebound. Extremities: B/L Lower Ext shows no edema, both legs are warm to touch Neurology: Awake alert, and oriented X 3, CN II-XII intact, Non focal Skin:No Rash  Data Review Lab Results  Component Value Date   HGBA1C 9.60 05/17/2014   HGBA1C 9.5 12/14/2013   HGBA1C 8.0 07/06/2013     Assessment & Plan   1. Other specified diabetes mellitus without complications  - Glucose (CBG) - HgB A1c  Aim for 2-3 Carb Choices per meal (30-45 grams) +/- 1 either way  Aim for 0-15 Carbs per snack if hungry  Include protein in moderation with your meals and snacks  Consider reading food labels for Total Carbohydrate and Fat Grams of foods  Consider checking BG at alternate times per day  Continue taking medication as directed Fruit Punch - find one with no sugar  Measure and decrease portions of carbohydrate foods  Make your plate and don't go back for seconds  2. Hypertriglyceridemia  - fenofibrate 160 MG tablet; Take 1 tablet (160 mg total) by mouth daily.  Dispense: 90 tablet; Refill: 3  To address this please limit saturated fat to no more than 7% of your calories, limit cholesterol to 200 mg/day, increase fiber and exercise as tolerated. If needed we may add another cholesterol lowering medication to your regimen.   3. Essential hypertension, benign  - hydrochlorothiazide (HYDRODIURIL) 25 MG tablet; Take 1 tablet (25 mg total) by mouth daily.  Dispense: 90 tablet; Refill: 3 Increase - lisinopril (PRINIVIL,ZESTRIL) 40 MG tablet; Take 1 tablet (40 mg total) by mouth daily.  Dispense: 90 tablet; Refill: 3 from 20 mg tablet by mouth daily. - cloNIDine (CATAPRES) tablet 0.1 mg; Take 1 tablet (0.1 mg total) by mouth once.  - We have discussed target BP range and blood pressure goal - I have advised patient to check BP regularly and to call us back or report to clinic if the numbers are consistently higher than 140/90  - We discussed the importance of compliance  with medical therapy and DASH diet recommended, consequences of uncontrolled hypertension discussed.  - continue current BP medications  4. Type 2 diabetes mellitus without complication  Increase - insulin NPH-regular Human (NOVOLIN 70/30) (70-30) 100 UNIT/ML injection; Inject 20 Units into the skin 2 (two) times daily with a meal.  Dispense: 30 mL; Refill: 3 patient was on 15 units subcutaneous injection twice a day before - metFORMIN (GLUCOPHAGE) 1000 MG tablet; Take 1 tablet (1,000 mg total) by mouth 2 (two) times daily with a meal.  Dispense: 180 tablet; Refill: 3  Patient have been counseled extensively about nutrition and exercise Return in about 3 months (around 08/17/2014) for Hemoglobin A1C and Follow up, DM, Follow up HTN, Annual Physical.  The patient was given clear instructions to go to ER or return to medical center if symptoms don't improve, worsen or new  problems develop. The patient verbalized understanding. The patient was told to call to get lab results if they haven't heard anything in the next week.   This note has been created with Surveyor, quantity. Any transcriptional errors are unintentional.    Angelica Chessman, MD, [place], [place], [place], [place], [place]   05/17/2014, 12:32 PM

## 2014-06-15 ENCOUNTER — Other Ambulatory Visit: Payer: Self-pay | Admitting: Internal Medicine

## 2014-07-17 ENCOUNTER — Other Ambulatory Visit: Payer: Self-pay | Admitting: Internal Medicine

## 2014-08-31 ENCOUNTER — Ambulatory Visit: Payer: Self-pay | Attending: Internal Medicine

## 2014-09-27 ENCOUNTER — Telehealth: Payer: Self-pay | Admitting: Internal Medicine

## 2014-09-27 NOTE — Telephone Encounter (Signed)
Patient called for med refill for Diabetes and High Blood pressure. Please f/u

## 2014-11-15 ENCOUNTER — Encounter: Payer: Self-pay | Admitting: Internal Medicine

## 2014-11-15 ENCOUNTER — Ambulatory Visit: Payer: Self-pay | Attending: Internal Medicine | Admitting: Internal Medicine

## 2014-11-15 ENCOUNTER — Other Ambulatory Visit: Payer: Self-pay | Admitting: Internal Medicine

## 2014-11-15 VITALS — BP 195/107 | HR 83 | Temp 98.4°F | Resp 18 | Ht 61.0 in | Wt 140.6 lb

## 2014-11-15 DIAGNOSIS — E781 Pure hyperglyceridemia: Secondary | ICD-10-CM | POA: Insufficient documentation

## 2014-11-15 DIAGNOSIS — Z794 Long term (current) use of insulin: Secondary | ICD-10-CM | POA: Insufficient documentation

## 2014-11-15 DIAGNOSIS — I10 Essential (primary) hypertension: Secondary | ICD-10-CM | POA: Insufficient documentation

## 2014-11-15 DIAGNOSIS — Z79899 Other long term (current) drug therapy: Secondary | ICD-10-CM | POA: Insufficient documentation

## 2014-11-15 DIAGNOSIS — E119 Type 2 diabetes mellitus without complications: Secondary | ICD-10-CM | POA: Insufficient documentation

## 2014-11-15 DIAGNOSIS — Z0000 Encounter for general adult medical examination without abnormal findings: Secondary | ICD-10-CM

## 2014-11-15 LAB — GLUCOSE, POCT (MANUAL RESULT ENTRY): POC GLUCOSE: 179 mg/dL — AB (ref 70–99)

## 2014-11-15 LAB — POCT GLYCOSYLATED HEMOGLOBIN (HGB A1C): Hemoglobin A1C: 9

## 2014-11-15 MED ORDER — GLUCOSE BLOOD VI STRP: ORAL_STRIP | Status: DC

## 2014-11-15 MED ORDER — TRUEPLUS LANCETS 28G MISC: Status: DC

## 2014-11-15 MED ORDER — INSULIN NPH ISOPHANE & REGULAR (70-30) 100 UNIT/ML ~~LOC~~ SUSP: 20.0000 [IU] | Freq: Two times a day (BID) | SUBCUTANEOUS | Status: DC

## 2014-11-15 MED ORDER — LISINOPRIL 40 MG PO TABS: 40.0000 mg | ORAL_TABLET | Freq: Every day | ORAL | Status: DC

## 2014-11-15 MED ORDER — CLONIDINE HCL 0.1 MG PO TABS
0.1000 mg | ORAL_TABLET | Freq: Once | ORAL | Status: AC
  Administered 2014-11-15: 0.1 mg via ORAL

## 2014-11-15 MED ORDER — TRUE METRIX METER W/DEVICE KIT: 1.0000 | PACK | Freq: Three times a day (TID) | Status: DC

## 2014-11-15 MED ORDER — HYDROCHLOROTHIAZIDE 25 MG PO TABS: 25.0000 mg | ORAL_TABLET | Freq: Every day | ORAL | Status: DC

## 2014-11-15 MED ORDER — METFORMIN HCL 1000 MG PO TABS
1000.0000 mg | ORAL_TABLET | Freq: Two times a day (BID) | ORAL | Status: DC
Start: 2014-11-15 — End: 2015-02-14

## 2014-11-15 MED ORDER — FENOFIBRATE 160 MG PO TABS: 160.0000 mg | ORAL_TABLET | Freq: Every day | ORAL | Status: DC

## 2014-11-15 NOTE — Progress Notes (Signed)
Patient here for F/U DM   Patient denies pain at this time. Patient states she has taken BP medication.  Patient needs refills on medications she is currently taking.

## 2014-11-15 NOTE — Progress Notes (Signed)
Patient ID: Aimee Parker, female   DOB: Jul 18, 1950, 64 y.o.   MRN: [id number]   Aimee Parker, is a 64 y.o. female  [id number]  [id number]  DOB - 05/16/1950  Chief Complaint  Patient presents with  . Follow-up        Subjective:   Aimee Parker is a 64 y.o. female here today for a follow up visit. Patient has history of hypertension, diabetes mellitus and heart murmur with normal echocardiogram and normal ejection fraction in 2015. Patient is here today for follow-up and for medication refills. Patient's blood pressure is elevated today, attributed to walking a long distance to this appointment. Hemoglobin A1c is down to 9% today. Patient claims compliant with medications, she reports no side effects. She has no complaints today. She will be traveling to [place] in February 2017 for couple of months, she is requesting 3 months medication supply with refills. She declined to have lab draw today because of cost, patient has no income. She also verbalized concern about referral to ophthalmologist and podiatrist for yearly checkup. Patient has No headache, No chest pain, No abdominal pain - No Nausea, No new weakness tingling or numbness, No Cough - SOB.  No problems updated.  ALLERGIES: No Known Allergies  PAST MEDICAL HISTORY: Past Medical History  Diagnosis Date  . Hypertension     MEDICATIONS AT HOME: Prior to Admission medications   Medication Sig Start Date End Date Taking? Authorizing Provider  fenofibrate 160 MG tablet Take 1 tablet (160 mg total) by mouth daily. 11/15/14  Yes Tresa Garter, MD  glucose blood (TRUETEST TEST) test strip Check Blood Sugar 3 x daily 11/15/14  Yes Dionte Blaustein E Doreene Burke, MD  glucose monitoring kit (FREESTYLE) monitoring kit 1 each by Does not apply route as needed for other. 07/06/13  Yes Tresa Garter, MD  insulin NPH-regular Human (NOVOLIN 70/30) (70-30) 100 UNIT/ML injection Inject 20 Units into the skin 2 (two) times daily with a meal.  11/15/14  Yes Irven Ingalsbe E Doreene Burke, MD  Insulin Syringe-Needle U-100 (INSULIN SYRINGE 1CC/28G) 28G X 1/2" 1 ML MISC 300 09/06/13  Yes Shikha Bibb E Doreene Burke, MD  lisinopril (PRINIVIL,ZESTRIL) 40 MG tablet Take 1 tablet (40 mg total) by mouth daily. 11/15/14  Yes Tresa Garter, MD  metFORMIN (GLUCOPHAGE) 1000 MG tablet Take 1 tablet (1,000 mg total) by mouth 2 (two) times daily with a meal. 11/15/14  Yes Tresa Garter, MD  TRUEPLUS LANCETS 28G MISC Test Blood Sugar 3 times daily 11/15/14  Yes Tresa Garter, MD  hydrochlorothiazide (HYDRODIURIL) 25 MG tablet Take 1 tablet (25 mg total) by mouth daily. 11/15/14   Tresa Garter, MD  meclizine (ANTIVERT) 25 MG tablet Take 1 tablet (25 mg total) by mouth 3 (three) times daily as needed for dizziness. Patient not taking: Reported on 11/15/2014 11/30/12   [name], NP  Vitamin D, Ergocalciferol, (DRISDOL) 50000 UNITS CAPS capsule Take 1 capsule (50,000 Units total) by mouth every 7 (seven) days. Patient not taking: Reported on 11/15/2014 04/04/13   Tresa Garter, MD     Objective:   Filed Vitals:   11/15/14 0907  BP: 195/107  Pulse: 83  Temp: 98.4 F (36.9 C)  TempSrc: Oral  Resp: 18  Height: 5' 1"  (1.549 m)  Weight: 140 lb 9.6 oz (63.776 kg)  SpO2: 94%    Exam General appearance : Awake, alert, not in any distress. Speech Clear. Not toxic looking HEENT: Atraumatic and Normocephalic, pupils equally reactive to light  and accomodation Neck: supple, no JVD. No cervical lymphadenopathy.  Chest:Good air entry bilaterally, no added sounds  CVS: S1 S2 regular, ++ systolic murmurs, grade 2-3.  Abdomen: Bowel sounds present, Non tender and not distended with no gaurding, rigidity or rebound. Extremities: B/L Lower Ext shows no edema, both legs are warm to touch Neurology: Awake alert, and oriented X 3, CN II-XII intact, Non focal  Data Review Lab Results  Component Value Date   HGBA1C 9.0 11/15/2014   HGBA1C 9.60  05/17/2014   HGBA1C 9.5 12/14/2013     Assessment & Plan   1. Type 2 diabetes mellitus without complication, unspecified long term insulin use status (HCC)  - POCT A1C - Glucose (CBG) - Microalbumin/Creatinine Ratio, Urine - Ambulatory referral to Ophthalmology - Ambulatory referral to Podiatry - insulin NPH-regular Human (NOVOLIN 70/30) (70-30) 100 UNIT/ML injection; Inject 20 Units into the skin 2 (two) times daily with a meal.  Dispense: 30 mL; Refill: 3 - metFORMIN (GLUCOPHAGE) 1000 MG tablet; Take 1 tablet (1,000 mg total) by mouth 2 (two) times daily with a meal.  Dispense: 180 tablet; Refill: 3 - TRUEPLUS LANCETS 28G MISC; Test Blood Sugar 3 times daily  Dispense: 100 each; Refill: 12 - glucose blood (TRUETEST TEST) test strip; Check Blood Sugar 3 x daily  Dispense: 100 each; Refill: 12  Aim for 30 minutes of exercise most days. Rethink what you drink. Water is great! Aim for 2-3 Carb Choices per meal (30-45 grams) +/- 1 either way  Aim for 0-15 Carbs per snack if hungry  Include protein in moderation with your meals and snacks  Consider reading food labels for Total Carbohydrate and Fat Grams of foods  Consider checking BG at alternate times per day  Continue taking medication as directed Be mindful about how much sugar you are adding to beverages and other foods. Fruit Punch - find one with no sugar  Measure and decrease portions of carbohydrate foods  Make your plate and don't go back for seconds   2. Essential hypertension, benign  Repeat BP 160/95 after 0.1 mg clonidine po.Patient counseled on medication compliance  Refill - hydrochlorothiazide (HYDRODIURIL) 25 MG tablet; Take 1 tablet (25 mg total) by mouth daily.  Dispense: 90 tablet; Refill: 3 - lisinopril (PRINIVIL,ZESTRIL) 40 MG tablet; Take 1 tablet (40 mg total) by mouth daily.  Dispense: 90 tablet; Refill: 3  We have discussed target BP range and blood pressure goal. I have advised patient to check BP  regularly and to call us back or report to clinic if the numbers are consistently higher than 140/90. We discussed the importance of compliance with medical therapy and DASH diet recommended, consequences of uncontrolled hypertension discussed.   - continue current BP medications  3. Hypertriglyceridemia  - fenofibrate 160 MG tablet; Take 1 tablet (160 mg total) by mouth daily.  Dispense: 90 tablet; Refill: 3  To address this please limit saturated fat to no more than 7% of your calories, limit cholesterol to 200 mg/day, increase fiber and exercise as tolerated. If needed we may add another cholesterol lowering medication to your regimen.   Patient have been counseled extensively about nutrition and exercise  Return in about 3 months (around 02/15/2015), or if symptoms worsen or fail to improve, for Hemoglobin A1C and Follow up, DM, Follow up HTN.  The patient was given clear instructions to go to ER or return to medical center if symptoms don't improve, worsen or new problems develop. The patient verbalized understanding. The  patient was told to call to get lab results if they haven't heard anything in the next week.   This note has been created with Surveyor, quantity. Any transcriptional errors are unintentional.    Angelica Chessman, MD, [place], [place] [place], [place] [facility] [place], [place]   11/15/2014, 9:54 AM

## 2014-11-15 NOTE — Patient Instructions (Signed)

## 2014-11-16 LAB — MICROALBUMIN / CREATININE URINE RATIO
Creatinine, Urine: 95 mg/dL (ref 20–320)
MICROALB/CREAT RATIO: 226 ug/mg{creat} — AB (ref <30)
Microalb, Ur: 21.5 mg/dL

## 2014-11-20 ENCOUNTER — Other Ambulatory Visit: Payer: Self-pay | Admitting: Internal Medicine

## 2014-12-20 ENCOUNTER — Ambulatory Visit: Payer: Self-pay | Admitting: Internal Medicine

## 2015-01-15 MED FILL — LISINOPRIL 40 MG TABLET: 40 | 30 days supply | Qty: 30 | Fill #2

## 2015-01-15 MED FILL — NOVOLIN 70/30 100 UNITS/ML: (70-30) 100 | 25 days supply | Qty: 10 | Fill #2

## 2015-01-15 MED FILL — FENOFIBRATE 160 MG TABLET: 160 | 30 days supply | Qty: 30 | Fill #2

## 2015-01-15 MED FILL — HYDROCHLOROTHIAZIDE 25 MG T: 25 | 30 days supply | Qty: 30 | Fill #2

## 2015-01-15 MED FILL — metFORMIN HCL 1000 MG TABS: 1000 | 30 days supply | Qty: 60 | Fill #2

## 2015-02-14 ENCOUNTER — Ambulatory Visit: Payer: Self-pay | Attending: Internal Medicine | Admitting: Internal Medicine

## 2015-02-14 ENCOUNTER — Encounter: Payer: Self-pay | Admitting: Internal Medicine

## 2015-02-14 VITALS — BP 190/87 | HR 83 | Temp 99.5°F | Resp 18 | Ht 61.5 in | Wt 140.6 lb

## 2015-02-14 DIAGNOSIS — E119 Type 2 diabetes mellitus without complications: Secondary | ICD-10-CM | POA: Insufficient documentation

## 2015-02-14 DIAGNOSIS — Z79899 Other long term (current) drug therapy: Secondary | ICD-10-CM | POA: Insufficient documentation

## 2015-02-14 DIAGNOSIS — E781 Pure hyperglyceridemia: Secondary | ICD-10-CM | POA: Insufficient documentation

## 2015-02-14 DIAGNOSIS — Z7984 Long term (current) use of oral hypoglycemic drugs: Secondary | ICD-10-CM | POA: Insufficient documentation

## 2015-02-14 DIAGNOSIS — I10 Essential (primary) hypertension: Secondary | ICD-10-CM | POA: Insufficient documentation

## 2015-02-14 DIAGNOSIS — Z794 Long term (current) use of insulin: Secondary | ICD-10-CM | POA: Insufficient documentation

## 2015-02-14 LAB — GLUCOSE, POCT (MANUAL RESULT ENTRY): POC GLUCOSE: 301 mg/dL — AB (ref 70–99)

## 2015-02-14 MED ORDER — FENOFIBRATE 160 MG PO TABS: 160.0000 mg | ORAL_TABLET | Freq: Every day | ORAL | Status: DC

## 2015-02-14 MED ORDER — TRUEPLUS LANCETS 28G MISC: Status: DC

## 2015-02-14 MED ORDER — HYDROCHLOROTHIAZIDE 25 MG PO TABS: 25.0000 mg | ORAL_TABLET | Freq: Every day | ORAL | Status: DC

## 2015-02-14 MED ORDER — METFORMIN HCL 1000 MG PO TABS: 1000.0000 mg | ORAL_TABLET | Freq: Two times a day (BID) | ORAL | Status: DC

## 2015-02-14 MED ORDER — LISINOPRIL 40 MG PO TABS: 40.0000 mg | ORAL_TABLET | Freq: Every day | ORAL | Status: DC

## 2015-02-14 MED ORDER — GLUCOSE BLOOD VI STRP: ORAL_STRIP | Status: DC

## 2015-02-14 MED FILL — TRUE METRIX TEST STRIP: 90 days supply | Qty: 300 | Fill #0

## 2015-02-14 MED FILL — TRUEplus LANCETS 28G MISC: 90 days supply | Qty: 300 | Fill #0

## 2015-02-14 MED FILL — LISINOPRIL 40 MG TABLET: 40 | 90 days supply | Qty: 90 | Fill #0

## 2015-02-14 MED FILL — HYDROCHLOROTHIAZIDE 25 MG T: 25 | 90 days supply | Qty: 90 | Fill #0

## 2015-02-14 MED FILL — FENOFIBRATE 160 MG TABLET: 160 | 90 days supply | Qty: 90 | Fill #0

## 2015-02-14 MED FILL — metFORMIN HCL 1000 MG TABS: 1000 | 90 days supply | Qty: 180 | Fill #0

## 2015-02-14 NOTE — Progress Notes (Signed)
Patient here for 3 month FU DM  Patient denies pain at this time  Patient will be traveling to [place] on Feb 5th and is requesting a 3 month supply of her medications.  Patient has eaten today and has only taken Metformin. Patient has been out of other medications for 3 days.

## 2015-02-14 NOTE — Progress Notes (Signed)
Patient ID: Aimee Parker, female   DOB: 1950-11-04, 65 y.o.   MRN: [id number]   Aimee Parker, is a 65 y.o. female  IHK:[id number]  [id number]  DOB - 12/08/1950  Chief Complaint  Patient presents with  . Follow-up    3 month DM        Subjective:   Aimee Parker is a 65 y.o. female very pleasant man with history of hypertension, type 2 diabetes mellitus requiring insulin and hypertriglyceridemia here today for a follow up visit and for medication refills. Patient is traveling to [place] in 3 days for about 3 months, she is requesting medication refills to last for about 3 months while she is out of the country. She has no complaints today. She claims compliant with her medications but she's been out of her blood pressure medicine for 3 days. She has no cough, no chest pain, no nausea, no vomiting, no leg swelling, no shortness of breath, no orthopnea. She reports no hypoglycemic episode. She claims her blood sugar has been within acceptable range when she takes her medications, she said her blood sugar ranges between 150 and 200 postprandial. She does not smoke cigarettes, she does not drink alcohol. No problems updated.  ALLERGIES: No Known Allergies  PAST MEDICAL HISTORY: Past Medical History  Diagnosis Date  . Hypertension     MEDICATIONS AT HOME: Prior to Admission medications   Medication Sig Start Date End Date Taking? Authorizing Provider  BD INSULIN SYRINGE ULTRAFINE 31G X 15/64" 0.3 ML MISC USE TO INJECT INSULIN AS DIRECTED BY PHYSICIAN 11/21/14  Yes Tresa Garter, MD  Blood Glucose Monitoring Suppl (TRUE METRIX METER) W/DEVICE KIT 1 each by Does not apply route 3 (three) times daily. 11/15/14  Yes Tresa Garter, MD  fenofibrate 160 MG tablet Take 1 tablet (160 mg total) by mouth daily. 02/14/15  Yes Tresa Garter, MD  glucose blood (TRUE METRIX BLOOD GLUCOSE TEST) test strip Use as instructed 02/14/15  Yes Shyloh Derosa E Doreene Burke, MD  hydrochlorothiazide  (HYDRODIURIL) 25 MG tablet Take 1 tablet (25 mg total) by mouth daily. 02/14/15  Yes Tresa Garter, MD  insulin NPH-regular Human (NOVOLIN 70/30) (70-30) 100 UNIT/ML injection Inject 20 Units into the skin 2 (two) times daily with a meal. 11/15/14  Yes Brendaliz Kuk E Doreene Burke, MD  lisinopril (PRINIVIL,ZESTRIL) 40 MG tablet Take 1 tablet (40 mg total) by mouth daily. 02/14/15  Yes Tresa Garter, MD  metFORMIN (GLUCOPHAGE) 1000 MG tablet Take 1 tablet (1,000 mg total) by mouth 2 (two) times daily with a meal. 02/14/15  Yes Tresa Garter, MD  TRUEPLUS LANCETS 28G MISC Test Blood Sugar 3 times daily 02/14/15  Yes Tresa Garter, MD  meclizine (ANTIVERT) 25 MG tablet Take 1 tablet (25 mg total) by mouth 3 (three) times daily as needed for dizziness. Patient not taking: Reported on 02/14/2015 11/30/12   [name], NP  Vitamin D, Ergocalciferol, (DRISDOL) 50000 UNITS CAPS capsule Take 1 capsule (50,000 Units total) by mouth every 7 (seven) days. Patient not taking: Reported on 02/14/2015 04/04/13   Tresa Garter, MD     Objective:   Filed Vitals:   02/14/15 1006  BP: 190/87  Pulse: 83  Temp: 99.5 F (37.5 C)  TempSrc: Oral  Resp: 18  Height: 5' 1.5" (1.562 m)  Weight: 140 lb 9.6 oz (63.776 kg)  SpO2: 98%    Exam General appearance : Awake, alert, not in any distress. Speech Clear. Not toxic looking HEENT:  Atraumatic and Normocephalic, pupils equally reactive to light and accomodation Neck: supple, no JVD. No cervical lymphadenopathy.  Chest:Good air entry bilaterally, no added sounds  CVS: S1 S2 regular, systolic murmurs radiating to the carotids.  Abdomen: Bowel sounds present, Non tender and not distended with no gaurding, rigidity or rebound. Extremities: B/L Lower Ext shows no edema, both legs are warm to touch Neurology: Awake alert, and oriented X 3, CN II-XII intact, Non focal Skin:No Rash  Data Review Lab Results  Component Value Date   HGBA1C 9.0 11/15/2014    HGBA1C 9.60 05/17/2014   HGBA1C 9.5 12/14/2013     Assessment & Plan   1. Type 2 diabetes mellitus without complication, unspecified long term insulin use status (HCC)  - POCT A1C - Glucose (CBG) - Microalbumin/Creatinine Ratio, Urine - metFORMIN (GLUCOPHAGE) 1000 MG tablet; Take 1 tablet (1,000 mg total) by mouth 2 (two) times daily with a meal.  Dispense: 180 tablet; Refill: 3 - TRUEPLUS LANCETS 28G MISC; Test Blood Sugar 3 times daily  Dispense: 100 each; Refill: 12 - glucose blood (TRUE METRIX BLOOD GLUCOSE TEST) test strip; Use as instructed  Dispense: 100 each; Refill: 12  Aim for 30 minutes of exercise most days. Rethink what you drink. Water is great! Aim for 2-3 Carb Choices per meal (30-45 grams) +/- 1 either way  Aim for 0-15 Carbs per snack if hungry  Include protein in moderation with your meals and snacks  Consider reading food labels for Total Carbohydrate and Fat Grams of foods  Consider checking BG at alternate times per day  Continue taking medication as directed Be mindful about how much sugar you are adding to beverages and other foods. Fruit Punch - find one with no sugar  Measure and decrease portions of carbohydrate foods  Make your plate and don't go back for seconds   2. Essential hypertension, benign  - hydrochlorothiazide (HYDRODIURIL) 25 MG tablet; Take 1 tablet (25 mg total) by mouth daily.  Dispense: 90 tablet; Refill: 3 - lisinopril (PRINIVIL,ZESTRIL) 40 MG tablet; Take 1 tablet (40 mg total) by mouth daily.  Dispense: 90 tablet; Refill: 3  We have discussed target BP range and blood pressure goal. I have advised patient to check BP regularly and to call us back or report to clinic if the numbers are consistently higher than 140/90. We discussed the importance of compliance with medical therapy and DASH diet recommended, consequences of uncontrolled hypertension discussed.   - continue current BP medications  3. Hypertriglyceridemia  -  fenofibrate 160 MG tablet; Take 1 tablet (160 mg total) by mouth daily.  Dispense: 90 tablet; Refill: 3  To address this please limit saturated fat to no more than 7% of your calories, limit cholesterol to 200 mg/day, increase fiber and exercise as tolerated. If needed we may add another cholesterol lowering medication to your regimen.   Patient have been counseled extensively about nutrition and exercise  Return in 3 months (on 05/23/2015) for Follow up HTN, Hemoglobin A1C and Follow up, DM.  The patient was given clear instructions to go to ER or return to medical center if symptoms don't improve, worsen or new problems develop. The patient verbalized understanding. The patient was told to call to get lab results if they haven't heard anything in the next week.   This note has been created with Surveyor, quantity. Any transcriptional errors are unintentional.    Tashauna Caisse, MD, MHA, FACP, FAAP, [place]  [facility] and [place], [place]   02/14/2015, 10:30 AM

## 2015-02-14 NOTE — Patient Instructions (Signed)
B?nh ti?u ???ng tp 2, Ng??i l?n (Type 2 Diabetes Mellitus, Adult) B?nh ti?u ???ng tp 2, th??ng g?i ??n gi?n l ti?u ???ng tp 2, l m?t b?nh ko di (m?n tnh). Trong ti?u ???ng tp 2, tuy?n t?y khng s?n xu?t ?? insulin (hocmon), cc t? bo t ?p ?ng v?i insulin lm cho ( khng insulin), ho?c c? hai. Thng th??ng, insulin v?n chuy?n ???ng t? th?c ?n vo cc t? bo ? m. Cc t? bo ? m s? d?ng ???ng ?? s?n sinh ra n?ng l??ng. Thi?u h?t insulin ho?c khng ?p ?ng bnh th??ng v?i insulin gy ra l??ng ???ng d? th?a tch t? trong mu thay v ?i vo cc t? bo ? m. K?t qu? l, lm cho l??ng ???ng trong mu cao (t?ng ???ng huy?t). ?nh h??ng c?a hm l??ng ???ng (glucose) cao c th? gy ra nhi?u bi?n ch?ng.  B?nh ti?u ???ng tp 2 tr??c ?y cn ???c g?i l b?nh ti?u ???ng kh?i pht ? ng??i l?n, nh?ng n c th? x?y ra ? b?t c? l?a tu?i no.  CC Y?U T? NGUY C?  M?t ng??i d? b? b?nh ti?u ???ng tp 2 n?u c ai ? trong gia ?nh b? b?nh ny, ??ng th?i c m?t ho?c nhi?u y?u t? nguy c? chnh sau ?y:  T?ng cn ho?c th?a cn ho?c bo ph.  L?i s?ng t ho?t ??ng.  Ti?n s? lin t?c ?n th?c ?n nhi?u n?ng l??ng. Duy tr cn n?ng bnh th??ng v ho?t ??ng thn th? th??ng xuyn c th? lm gi?m nguy c? pht tri?n b?nh ti?u ???ng tp 2. TRI?U CH?NG  Ban ??u, m?t ng??i b? b?nh ti?u ???ng tp 2 c th? khng c cc tri?u ch?ng. Cc tri?u ch?ng c?a b?nh ti?u ???ng tp 2 xu?t hi?n t? t?. Cc tri?u ch?ng bao g?m:  Kht n??c nhi?u (ch?ng kht nhi?u).  Ti?u ti?n nhi?u (?a ni?u).  ?i ti?u nhi?u vo ban ?m (ti?u ?m).  Thay ??i cn n?ng m?t cch ??t ng?t ho?c khng r nguyn nhn.  Th??ng xuyn b? nhi?m trng ti pht.  M?t m?i (m?t)  Y?u.  Thay ??i th? l?c, ch?ng h?n nh? nhn m?.  Mi tri cy trong h?i th? c?a qu v?.  ?au b?ng.  Bu?n nn ho?c nn m?a.  V?t c?t ho?c v?t b?m tm lu lnh.  ?au bu?t ho?c t ? bn tay ho?c bn chn.  V?t th??ng h? trn da (lot). CH?N ?ON B?nh ti?u ???ng tp 2 th??ng  khng ???c ch?n ?on cho ??n khi xu?t hi?n cc bi?n ch?ng c?a b?nh ti?u ???ng. B?nh ti?u ???ng tp 2 ???c ch?n ?on khi xu?t hi?n cc tri?u ch?ng c?ng nh? bi?n ch?ng v khi l??ng ???ng huy?t t?ng. L??ng ???ng huy?t c th? ???c ki?m tra b?ng m?t ho?c nhi?u xt nghi?m mu sau ?y:  Xt nghi?m ???ng huy?t lc ?i. Qu v? s? khng ???c php ?n trong t nh?t l 8 ti?ng tr??c khi l?y m?u mu.  Xt nghi?m ???ng huy?t ng?u nhin. ???ng huy?t ???c xt nghi?m b?t k? lc no trong ngy, b?t k? qu v? ?n lc no.  Xt nghi?m ???ng huy?t A1c hemoglobin. Xt nghi?m A1c hemoglobin cung c?p thng tin v? vi?c ki?m sot ???ng huy?t trong 3 thng tr??c ?.  Xt nghi?m dung n?p glucose theo ???ng u?ng (OGTT). ???ng huy?t c?a qu v? ???c ?o sau khi qu v? ch?a ?n (nh?n ?n) trong 2 gi? v sau ? l sau khi qu v? u?ng ?? u?ng c ch?a glucose. ?  I?U TR?   Qu v? c th? c?n dng insulin ho?c thu?c tr? ti?u ???ng hng ngy ?? gi? cho l??ng ???ng huy?t trong ph?m vi mong mu?n.  N?u qu v? dng insulin, qu v? c th? c?n ?i?u ch?nh li?u thu?c ty thu?c vo l??ng carbohydrate m qu v? ?n trong m?i b?a ?n chnh ho?c b?a ?n nh?.  Thay ??i l?i s?ng ???c khuy?n ngh? l m?t ph?n trong bi?n php ?i?u tr? c?a qu v?. Nh?ng thay ??i ny c th? bao g?m:  Tun theo m?t ch? ?? ?n c nhn ha do m?t chuyn gia dinh d??ng ??a ra.  T?p th? d?c hng ngy. Chuyn gia ch?m sc s?c kh?e s? ??t cc m?c tiu ?i?u tr? c nhn ha cho qu v? d?a theo tu?i, cc lo?i thu?c c?a qu v?, th?i gian qu v? ? b? ti?u t??ng, v b?t k? tnh tr?ng b?nh l no khc m qu v? c. Thng th??ng, m?c tiu ?i?u tr? l duy tr n?ng ?? glucose trong mu:  Tr??c khi ?n (tr??c b?a ?n): 80-130 mg/dL.  Sau khi ?n (sau b?a ?n): d??i 180 mg/dL.  A1c: th?p h?n 6,5-7%. H??NG D?N CH?M SC T?I NH   L??ng A1c hemoglobin c?a qu v? ???c ki?m tra hai l?n m?i n?m.  Th?c hi?n vi?c theo di ???ng huy?t hng ngy theo ch? d?n c?a chuyn gia ch?m sc s?c kh?e.  Theo  di [place] trong n??c ti?u khi qu v? b? b?nh v theo ch? d?n c?a chuyn gia ch?m sc s?c kh?e.  S? d?ng thu?c tr? ti?u ???ng ho?c insulin theo ch? d?n c?a chuyn gia ch?m sc s?c kh?e ?? duy tr l??ng ???ng huy?t trong ph?m vi mong mu?n.  Khng bao gi? ?? h?t thu?c tr? ti?u ???ng ho?c insulin. Thu?c c?n ph?i dng hng ngy.  N?u qu v? ?ang dng insulin, qu v? c th? ?i?u ch?nh l??ng insulin d?a vo l??ng carbohydrates qu v? ?n. Carbohydrate c th? lm t?ng l??ng ???ng huy?t nh?ng c?n ph?i bao g?m trong ch? ?? ?n u?ng c?a qu v?. Carbohydrate cung c?p vitamin, khong ch?t v ch?t x?, l m?t ph?n thi?t y?u c?a ch? ?? ?n u?ng c l?i cho s?c kh?e. Carbohydrate ???c tm th?y trong tri cy, rau, ng? c?c, cc s?n ph?m t? s?a, cc lo?i ??u v cc lo?i th?c ph?m c b? sung thm ???ng.  ?n th?c ?n c l?i cho s?c kh?e. Qu v? c?n h?n g?p m?t chuyn gia dinh d??ng c ??ng k hnh ngh? ?? gip qu v? ??a ra m?t k? ho?ch ?n u?ng ph h?p.  Gi?m cn n?u qu v? th?a cn.  Mang theo th? c?nh bo y t? ho?c ?eo ?? trang s?c c c?nh bo y t?.  Mang theo ?? ?n nh? ch?a 15 gam carbohydrate m?i lc ?? ?i?u tr? h? ???ng huy?t (h? ???ng huy?t). M?t s? v d? v? ?? ?n nh? ch?a 15 gam carbohydrate bao g?m:  Vin glucose, 3 ho?c 4.  Gel glucose, ?ng 15 gam.  Nho kh, 2 mu?ng (24 gam).  Th?ch hnh h?t ??u, 6.  Bnh quy hnh con gi?ng, 8.  N??c u?ng c ga thng th??ng, 4 aox? (120 ml)  K?o chp chp, 9.  Nh?n bi?t h? ???ng huy?t. H? ???ng huy?t x?y ra khi l??ng ???ng huy?t t? 70 mg/dL tr? xu?ng. Nguy c? h? ???ng huy?t gia t?ng khi nh?n ?n ho?c b? b?a, trong v sau khi t?p th? d?c c??ng ?? cao v trong khi   ng?. Cc tri?u ch?ng h? ???ng huy?t c th? bao g?m:  Run ho?c l?c.  Gi?m kh? n?ng t?p trung.  ?? m? hi.  Nh?p tim t?ng.  ?au ??u.  Kh mi?ng.  ?i.  D? b? kch thch.  Lo u.  Ng? khng yn.  Thay ??i l?i ni ho?c s? ph?i h?p.  B? l l?n.  ?i?u tr? h? ???ng huy?t k?p th?i. N?u qu v?  t?nh to v c th? nu?t m?t cch an ton, hy theo quy t?c 15:15:  Dng 15-20 gam glucose ho?c carbohydrate c tc d?ng nhanh. L?a ch?n tc ??ng nhanh bao g?m gel glucose, vin glucose ho?c 4 aox? (120 ml) n??c p tri cy, soda bnh th??ng ho?c s?a t bo.  Ki?m tra l??ng ???ng huy?t c?a qu v? 15 pht sau khi u?ng glucose.  Dng t? 15-20 gam glucose tr? ln n?u l??ng ???ng huy?t ???c ?o l?i v?n ? m?c 70 mg/dL tr? xu?ng.  ?n theo b?a ?n bnh th??ng ho?c ?? ?n nh? trong vng 1 ti?ng sau khi l??ng ???ng huy?t tr? l?i bnh th??ng.  Hy c?nh gic v?i c?m gic r?t kht v ?i ti?u ti?n nhi?u l?n h?n bnh th??ng v ?y l nh?ng d?u hi?u s?m c?a t?ng ???ng huy?t. Vi?c pht hi?n t?ng ???ng huy?t s?m cho php ?i?u tr? k?p th?i. ?i?u tr? t?ng ???ng huy?t theo ch? d?n c?a chuyn gia ch?m [place] s?c kh?e.  M?i tu?n tham gia vo t nh?t 150 pht ho?t ??ng thn th? v?i c??ng ?? trung bnh, phn b? trong t nh?t 3 ngy trong tu?n ho?c theo ch? d?n c?a chuyn gia ch?m [place] s?c kh?e. Ngoi ra, qu v? nn tham gia vo bi t?p c s?c c?n t nh?t 2 l?n m?t tu?n ho?c theo ch? d?n c?a chuyn gia ch?m [place] s?c kh?e. C? g?ng dnh khng qu 90 pht m?i l?n khng ho?t ??ng.  ?i?u ch?nh thu?c v l??ng th?c ?n khi c?n n?u qu v? b?t ??u m?t bi t?p ho?c m?t mn th? thao m?i.  Lm theo k? ho?ch trong ngy b? b?nh c?a qu v? b?t c? lc no m qu v? khng th? ?n ho?c u?ng nh? bnh th??ng.  Khng s? d?ng cc s?n ph?m thu?c l bao g?m thu?c l ht, thu?c l d?ng nhai ho?c thu?c l ?i?n t?. N?u qu v? c?n gip ?? ?? cai thu?c, hy h?i chuyn gia ch?m [place] s?c kh?e.  Gi?i h?n l??ng r??u qu v? u?ng khng qu 1 ly m?i ngy v?i ph? n? khng mang thai v 2 ly m?i ngy v?i nam gi?i. Qu v? ch? nn u?ng r??u khi ?n. Ni chuy?n v?i chuyn gia ch?m [place] s?c kh?e xem u?ng r??u c an ton cho qu v? hay khng. Cho chuyn gia ch?m [place] s?c kh?e bi?t n?u qu v? u?ng r??u vi l?n m?i tu?n.  Tun th? m?i cu?c h?n khm l?i theo ch? d?n c?a chuyn  gia ch?m [place] s?c kh?e. ?i?u ny l quan tr?ng.  S?p x?p bu?i khm m?t ngay sau khi ch?n ?on b?nh ti?u ???ng tp 2 v sau ? l hng n?m.  Th?c hi?n ch?m [place] da v bn chn hng ngy. Ki?m tra da v bn chn hng ngy xem c v?t c?t, v?t b?m tm, t?y ??, v?n ?? v? mng, ch?y mu, m?n n??c hay l? lot khng. Bn chn c?n ???c chuyn gia ch?m [place] s?c kh?e khm hng n?m.  ?nh r?ng v l?i t nh?t hai l?n m?i ngy  v dng ch? nha khoa t nh?t m?t l?n m?i ngy. G?p nha s? ?? khm l?i th??ng xuyn.  Chia s? k? ho?ch qu?n l b?nh ti?u ???ng c?a qu v? ? n?i lm vi?c ho?c tr??ng h?c c?a qu v?.  C?p nh?t l?ch tim ch?ng c?a qu v?. Qu v? nn tim v?c xin cm (b?nh cm) hng n?m. Qu v? c?ng nn tim v?c xin vim ph?i (ph? c?u khu?n). N?u qu v? 65 tu?i tr? ln v ch?a ???c tim v?c xin vim ph?i, v?c xin ny c th? ???c tim m?t lo?t hai m?i ring. Hy h?i chuyn gia ch?m [place] s?c kh?e xem nn tim thm lo?i v?c xin no.  H?c cch qu?n l c?ng th?ng.  Xin ???c gio d?c v h? tr? v? b?nh ti?u ???ng th??ng xuyn khi c?n.  Tham gia ho?c tm cch ph?c h?i ch?c n?ng khi c?n thi?t ?? duy tr ho?c c?i thi?n kh? n?ng ??c l?p v ch?t l??ng cu?c s?ng. Yu c?u chuy?n sang v?t l tr? li?u ho?c li?u php ngh? nghi?p n?u qu v? b? t bn chn ho?c t tay, ho?c kh ch?i ??u, kh m?c qu?n o, ?n u?ng ho?c ho?t ??ng th? ch?t. ?I KHM N?U:   Qu v? khng th? ?n ho?c u?ng trong h?n 6 ti?ng.  Qu v? b? bu?n nn v nn m?a trong h?n 6 ti?ng.  L??ng ???ng huy?t c?a qu v? cao trn 240 mg/dL.  C thay ??i tr?ng thi tinh th?n.  Qu v? b? thm m?t c?n b?nh nghim tr?ng.  Qu v? b? tiu ch?y trong h?n 6 ti?ng.  Qu v? ? b? ?m ho?c b? s?t trong m?t vi ngy v khng ?? h?n.  Qu v? b? ?au trong khi tham gia b?t k? ho?t ??ng thn th? no. NGAY L?P T?C ?I KHM N?U:  Qu v? b? kh th?.  Qu v? c l??ng ketone ? m?c trung bnh ??n cao.   Thng tin ny khng nh?m m?c ?ch thay th? cho l?i khuyn m chuyn gia ch?m  [place] s?c kh?e ni v?i qu v?. Hy b?o ??m qu v? ph?i th?o lu?n b?t k? v?n ?? g m qu v? c v?i chuyn gia ch?m [place] s?c kh?e c?a qu v?.   Document Released: 12/29/2004 Document Revised: 09/19/2014 Elsevier Interactive Patient Education 2016 Elsevier [employer]. T?ng huy?t p (Hypertension) T?ng huy?t p, th??ng ???c g?i l huy?t p cao, l khi l?c b?m mu qua ??ng m?ch c?a qu v? qu m?nh. ??ng m?ch c?a qu v? l cc m?ch mu mang mu t? tim ?i kh?p c? th? c?a qu v?. K?t qu? ?o huy?t p c m?t con s? cao v m?t con s? th?p, ch?ng h?n nh? 110/72. Con s? cao (tm thu) l p l?c bn trong ??ng m?ch khi tim qu v? b?m. Con s? th?p (tm tr??ng) l p l?c bn trong ??ng m?ch khi tim qu v? gin ra. Huy?t p l t??ng c?n cho qu v? ph?i d??i 120/80. Ch?ng t?ng huy?t p bu?c tim qu v? ph?i lm vi?c v?t v? h?n ?? b?m mu. ??ng m?ch c?a qu v? c th? b? h?p ho?c c?ng. Huy?t p cao khng ???c ?i?u tr? ho?c khng ???c ki?m sot c th? d?n t?i nh?i mu c? tim, ??t qu?, b?nh th?n v nh?ng v?n ?? khc. CC Y?U T? NGUY C? M?t s? y?u t? nguy c? d?n ??n huy?t p cao c th? ki?m sot ???c. M?t s? y?u t? khc th khng.  Nh?ng y?u t?  nguy c? khng th? ki?m sot ???c bao g?m:   Ch?ng t?c. Qu v? c nguy c? cao h?n n?u qu v? l ng??i M? g?c Phi.  ?? tu?i. Nguy c? t?ng ln theo ?? tu?i.  Gi?i tnh. Nam gi?i c nguy c? cao h?n ph? n? tr??c tu?i 45. Sau tu?i 65, ph? n? c nguy c? cao h?n nam gi?i. Nh?ng y?u t? nguy c? c th? ki?m sot ???c bao g?m:  Khng t?p th? d?c ho?c cc ho?t ??ng th? ch?t ??y ??Marland Kitchen  Th?a cn.  ?n qu nhi?u ch?t bo, ???ng, ca-lo, ho?c mu?i.  U?ng qu nhi?u r??u. D?U HI?U V TRI?U CH?NG T?ng huy?t p th??ng khng gy ra d?u hi?u ho?c tri?u ch?ng. Huy?t p r?t cao (c?n cao huy?t p) c th? gy ?au ??u, lo l?ng, kh th? v ch?y mu cam. CH?N ?ON ?? ki?m tra xem qu v? c t?ng huy?t p khng, chuyn gia ch?m [place] s?c kh?e c?a qu v? s? ?o huy?t p trong khi qu v? ng?i ??t tay ? m?c ngang v?i  tim. Huy?t p c?n ???c ?o t nh?t hai l?n trn cng m?t cnh tay. M?t s? tnh tr?ng nh?t ??nh c th? lm cho huy?t p khc nhau gi?a tay ph?i v tay tri c?a qu v?. K?t qu? ?o huy?t p cao h?n bnh th??ng ? m?t th?i ?i?m no ? khng c ngh?a l qu v? c?n ?i?u tr?Marland Kitchen N?u khng r li?u qu v? c huy?t p cao hay khng, qu v? c th? ???c ?? ngh? tr? l?i vo m?t ngy khc ?? ki?m tra l?i huy?t p. Ho?c qu v? c th? ???c yu c?u theo di huy?t p ? nh trong 1 tu?n ho?c h?n. ?I?U TR? ?i?u tr? huy?t p cao gao g?m thay ??i l?i s?ng v c th? ph?i dng thu?c. C m?t l?i s?ng lnh m?nh c th? gip lm gi?m huy?t p cao. Qu v? c th? c?n thay ??i m?t s? thi quen. Thay ??i l?i s?ng c th? bao g?m:  Th?c hi?n ch? ?? ?n DASH. Ch? ?? ?n ny c nhi?u tri cy, rau v ng? c?c nguyn h?t. C t mu?i, th?t ??, v t b? sung ???ng.  Duy tr l??ng mu?i tiu th? d??i 2.300 mg m?i ngy.  T?p aerobic t nh?t 30-45 pht t nh?t 4 l?n m?i tu?n.  Gi?m cn n?u c?n thi?t.  Khng ht thu?c.  H?n ch? ?? u?ng c c?n.  H?c cc cch gi?m c?ng th?ng. Chuyn gia ch?m [place] s?c kh?e c th? k ??n thu?c n?u thay ??i l?i s?ng khng ?? ?? ??a huy?t p v? m?c c th? ki?m sot ???c v n?u m?t trong nh?ng ?i?u sau l ?ng:  Qu v? t? 18-59 tu?i v huy?t p tm thu c?a qu v? trn 140.  Qu v? t? 60 tu?i tr? ln v huy?t p tm thu c?a qu v? trn 150.  Huy?t p tm tr??ng c?a qu v? trn 90.  Qu v? b? ti?u ???ng v huy?t p tm thu c?a qu v? trn 140 ho?c huy?t p tm tr??ng c?a qu v? trn 90.  Qu v? b? b?nh th?n v huy?t p qu v? trn 140/90.  Qu v? b? b?nh tim v huy?t p qu v? trn 140/90. Huy?t p m?c tiu c nhn c?a qu v? c th? khc nhau ty thu?c v tnh tr?ng b?nh l, tu?i v cc nhn t? khc. H??NG D?N CH?M [place] T?I NH  Ki?m tra l?i huy?t p  c?a qu v? theo ch? d?n c?a chuyn gia ch?m [place] s?c kh?e.  Ch? s? d?ng thu?c theo ch? d?n c?a chuyn gia ch?m [place] s?c kh?e. Lm theo ch? d?n m?t cch c?n th?n.  Thu?c ?i?u tr? huy?t p ph?i ???c dng theo ??n ? k. Thu?c c?ng s? khng c tc d?ng khi qu v? b? li?u. Vi?c b? li?u thu?c c?ng lm qu v? c nguy c? pht sinh v?n ??Imagene Sheller ht thu?c.  Theo di huy?t p c?a qu v? ? nh theo ch? d?n c?a chuyn gia ch?m [place] s?c kh?e. ?I [facility] N?U:   Qu v? ngh? qu v? c ph?n ?ng v?i thu?c ?ang dng.  Qu v? b? ?au ??u ho?c c?m th?y chng m?t ti di?n.  Qu v? b? s?ng ph ? m?t c chn.  Qu v? c v?n ?? v? th? l?c. NGAY L?P T?C ?I [facility] N?U:  Qu v? b? ?au ??u n?ng ho?c l l?n.  Qu v? b? y?u b?t th??ng, t b, ho?c c?m th?y nh? ng?t x?u.  Qu v? b? ?au ng?c ho?c ?au b?ng r?t nhi?u.  Qu v? nn nhi?u l?n.  Qu v? b? kh th?. ??M B?O QU V?:   Hi?u r cc h??ng d?n ny.  S? theo di tnh tr?ng c?a mnh.  S? yu c?u tr? gip ngay l?p t?c n?u qu v? c?m th?y khng kh?e ho?c th?y tr?m tr?ng h?n.   Thng tin ny khng nh?m m?c ?ch thay th? cho l?i khuyn m chuyn gia ch?m [place] s?c kh?e ni v?i qu v?. Hy b?o ??m qu v? ph?i th?o lu?n b?t k? v?n ?? g m qu v? c v?i chuyn gia ch?m [place] s?c kh?e c?a qu v?.   Document Released: 12/29/2004 Document Revised: 09/19/2014 [employer] Interactive Patient Education [employer]

## 2015-05-22 MED FILL — FENOFIBRATE 160 MG TABLET: 160 | 90 days supply | Qty: 90 | Fill #1

## 2015-05-22 MED FILL — NOVOLIN 70/30 100 UNITS/ML: (70-30) 100 | 25 days supply | Qty: 10 | Fill #3

## 2015-05-22 MED FILL — metFORMIN HCL 1000 MG TABS: 1000 | 90 days supply | Qty: 180 | Fill #1

## 2015-05-22 MED FILL — BD INSULIN SYR 0.3 ML 6MMX3: 31G X 15/64 | 50 days supply | Qty: 200 | Fill #1

## 2015-05-22 MED FILL — HYDROCHLOROTHIAZIDE 25 MG T: 25 | 90 days supply | Qty: 90 | Fill #1

## 2015-05-22 MED FILL — LISINOPRIL 40 MG TABLET: 40 | 90 days supply | Qty: 90 | Fill #1

## 2015-05-23 ENCOUNTER — Encounter: Payer: Self-pay | Admitting: Internal Medicine

## 2015-05-23 ENCOUNTER — Ambulatory Visit: Payer: Self-pay | Attending: Internal Medicine | Admitting: Internal Medicine

## 2015-05-23 VITALS — BP 165/81 | HR 75 | Temp 98.7°F | Resp 18 | Ht 61.0 in | Wt 136.4 lb

## 2015-05-23 DIAGNOSIS — E781 Pure hyperglyceridemia: Secondary | ICD-10-CM | POA: Insufficient documentation

## 2015-05-23 DIAGNOSIS — Z79899 Other long term (current) drug therapy: Secondary | ICD-10-CM | POA: Insufficient documentation

## 2015-05-23 DIAGNOSIS — Z7984 Long term (current) use of oral hypoglycemic drugs: Secondary | ICD-10-CM | POA: Insufficient documentation

## 2015-05-23 DIAGNOSIS — I10 Essential (primary) hypertension: Secondary | ICD-10-CM | POA: Insufficient documentation

## 2015-05-23 DIAGNOSIS — E119 Type 2 diabetes mellitus without complications: Secondary | ICD-10-CM | POA: Insufficient documentation

## 2015-05-23 DIAGNOSIS — Z794 Long term (current) use of insulin: Secondary | ICD-10-CM | POA: Insufficient documentation

## 2015-05-23 LAB — GLUCOSE, POCT (MANUAL RESULT ENTRY)
POC Glucose: 355 mg/dl — AB (ref 70–99)
POC Glucose: 318 mg/dl — AB (ref 70–99)

## 2015-05-23 LAB — HEMOGLOBIN A1C
HEMOGLOBIN A1C: 9.6 % — AB (ref <5.7)
MEAN PLASMA GLUCOSE: 229 mg/dL

## 2015-05-23 LAB — POCT URINALYSIS DIPSTICK
BILIRUBIN UA: NEGATIVE
GLUCOSE UA: 500
KETONES UA: NEGATIVE
Leukocytes, UA: NEGATIVE
NITRITE UA: NEGATIVE
PH UA: 5
Protein, UA: NEGATIVE
RBC UA: NEGATIVE
Spec Grav, UA: 1.015
Urobilinogen, UA: 0.2

## 2015-05-23 MED ORDER — INSULIN NPH ISOPHANE & REGULAR (70-30) 100 UNIT/ML ~~LOC~~ SUSP: 20.0000 [IU] | Freq: Two times a day (BID) | SUBCUTANEOUS | Status: DC

## 2015-05-23 MED ORDER — INSULIN ASPART 100 UNIT/ML ~~LOC~~ SOLN
20.0000 [IU] | Freq: Once | SUBCUTANEOUS | Status: AC
  Administered 2015-05-23: 20 [IU] via SUBCUTANEOUS

## 2015-05-23 NOTE — Patient Instructions (Signed)
B?nh ti?u ???ng tp 2, Ng??i l?n (Type 2 Diabetes Mellitus, Adult) B?nh ti?u ???ng tp 2, th??ng g?i ??n gi?n l ti?u ???ng tp 2, l m?t b?nh ko di (m?n tnh). Trong ti?u ???ng tp 2, tuy?n t?y khng s?n xu?t ?? insulin (hocmon), cc t? bo t ?p ?ng v?i insulin lm cho ( khng insulin), ho?c c? hai. Thng th??ng, insulin v?n chuy?n ???ng t? th?c ?n vo cc t? bo ? m. Cc t? bo ? m s? d?ng ???ng ?? s?n sinh ra n?ng l??ng. Thi?u h?t insulin ho?c khng ?p ?ng bnh th??ng v?i insulin gy ra l??ng ???ng d? th?a tch t? trong mu thay v ?i vo cc t? bo ? m. K?t qu? l, lm cho l??ng ???ng trong mu cao (t?ng ???ng huy?t). ?nh h??ng c?a hm l??ng ???ng (glucose) cao c th? gy ra nhi?u bi?n ch?ng.  B?nh ti?u ???ng tp 2 tr??c ?y cn ???c g?i l b?nh ti?u ???ng kh?i pht ? ng??i l?n, nh?ng n c th? x?y ra ? b?t c? l?a tu?i no.  CC Y?U T? NGUY C?  M?t ng??i d? b? b?nh ti?u ???ng tp 2 n?u c ai ? trong gia ?nh b? b?nh ny, ??ng th?i c m?t ho?c nhi?u y?u t? nguy c? chnh sau ?y:  T?ng cn ho?c th?a cn ho?c bo ph.  L?i s?ng t ho?t ??ng.  Ti?n s? lin t?c ?n th?c ?n nhi?u n?ng l??ng. Duy tr cn n?ng bnh th??ng v ho?t ??ng thn th? th??ng xuyn c th? lm gi?m nguy c? pht tri?n b?nh ti?u ???ng tp 2. TRI?U CH?NG  Ban ??u, m?t ng??i b? b?nh ti?u ???ng tp 2 c th? khng c cc tri?u ch?ng. Cc tri?u ch?ng c?a b?nh ti?u ???ng tp 2 xu?t hi?n t? t?. Cc tri?u ch?ng bao g?m:  Kht n??c nhi?u (ch?ng kht nhi?u).  Ti?u ti?n nhi?u (?a ni?u).  ?i ti?u nhi?u vo ban ?m (ti?u ?m).  Thay ??i cn n?ng m?t cch ??t ng?t ho?c khng r nguyn nhn.  Th??ng xuyn b? nhi?m trng ti pht.  M?t m?i (m?t)  Y?u.  Thay ??i th? l?c, ch?ng h?n nh? nhn m?.  Mi tri cy trong h?i th? c?a qu v?.  ?au b?ng.  Bu?n nn ho?c nn m?a.  V?t c?t ho?c v?t b?m tm lu lnh.  ?au bu?t ho?c t ? bn tay ho?c bn chn.  V?t th??ng h? trn da (lot). CH?N ?ON B?nh ti?u ???ng tp 2 th??ng  khng ???c ch?n ?on cho ??n khi xu?t hi?n cc bi?n ch?ng c?a b?nh ti?u ???ng. B?nh ti?u ???ng tp 2 ???c ch?n ?on khi xu?t hi?n cc tri?u ch?ng c?ng nh? bi?n ch?ng v khi l??ng ???ng huy?t t?ng. L??ng ???ng huy?t c th? ???c ki?m tra b?ng m?t ho?c nhi?u xt nghi?m mu sau ?y:  Xt nghi?m ???ng huy?t lc ?i. Qu v? s? khng ???c php ?n trong t nh?t l 8 ti?ng tr??c khi l?y m?u mu.  Xt nghi?m ???ng huy?t ng?u nhin. ???ng huy?t ???c xt nghi?m b?t k? lc no trong ngy, b?t k? qu v? ?n lc no.  Xt nghi?m ???ng huy?t A1c hemoglobin. Xt nghi?m A1c hemoglobin cung c?p thng tin v? vi?c ki?m sot ???ng huy?t trong 3 thng tr??c ?.  Xt nghi?m dung n?p glucose theo ???ng u?ng (OGTT). ???ng huy?t c?a qu v? ???c ?o sau khi qu v? ch?a ?n (nh?n ?n) trong 2 gi? v sau ? l sau khi qu v? u?ng ?? u?ng c ch?a glucose. ?  I?U TR?   Qu v? c th? c?n dng insulin ho?c thu?c tr? ti?u ???ng hng ngy ?? gi? cho l??ng ???ng huy?t trong ph?m vi mong mu?n.  N?u qu v? dng insulin, qu v? c th? c?n ?i?u ch?nh li?u thu?c ty thu?c vo l??ng carbohydrate m qu v? ?n trong m?i b?a ?n chnh ho?c b?a ?n nh?.  Thay ??i l?i s?ng ???c khuy?n ngh? l m?t ph?n trong bi?n php ?i?u tr? c?a qu v?. Nh?ng thay ??i ny c th? bao g?m:  Tun theo m?t ch? ?? ?n c nhn ha do m?t chuyn gia dinh d??ng ??a ra.  T?p th? d?c hng ngy. Chuyn gia ch?m sc s?c kh?e s? ??t cc m?c tiu ?i?u tr? c nhn ha cho qu v? d?a theo tu?i, cc lo?i thu?c c?a qu v?, th?i gian qu v? ? b? ti?u t??ng, v b?t k? tnh tr?ng b?nh l no khc m qu v? c. Thng th??ng, m?c tiu ?i?u tr? l duy tr n?ng ?? glucose trong mu:  Tr??c khi ?n (tr??c b?a ?n): 80-130 mg/dL.  Sau khi ?n (sau b?a ?n): d??i 180 mg/dL.  A1c: th?p h?n 6,5-7%. H??NG D?N CH?M SC T?I NH   L??ng A1c hemoglobin c?a qu v? ???c ki?m tra hai l?n m?i n?m.  Th?c hi?n vi?c theo di ???ng huy?t hng ngy theo ch? d?n c?a chuyn gia ch?m sc s?c kh?e.  Theo  di [place] trong n??c ti?u khi qu v? b? b?nh v theo ch? d?n c?a chuyn gia ch?m sc s?c kh?e.  S? d?ng thu?c tr? ti?u ???ng ho?c insulin theo ch? d?n c?a chuyn gia ch?m sc s?c kh?e ?? duy tr l??ng ???ng huy?t trong ph?m vi mong mu?n.  Khng bao gi? ?? h?t thu?c tr? ti?u ???ng ho?c insulin. Thu?c c?n ph?i dng hng ngy.  N?u qu v? ?ang dng insulin, qu v? c th? ?i?u ch?nh l??ng insulin d?a vo l??ng carbohydrates qu v? ?n. Carbohydrate c th? lm t?ng l??ng ???ng huy?t nh?ng c?n ph?i bao g?m trong ch? ?? ?n u?ng c?a qu v?. Carbohydrate cung c?p vitamin, khong ch?t v ch?t x?, l m?t ph?n thi?t y?u c?a ch? ?? ?n u?ng c l?i cho s?c kh?e. Carbohydrate ???c tm th?y trong tri cy, rau, ng? c?c, cc s?n ph?m t? s?a, cc lo?i ??u v cc lo?i th?c ph?m c b? sung thm ???ng.  ?n th?c ?n c l?i cho s?c kh?e. Qu v? c?n h?n g?p m?t chuyn gia dinh d??ng c ??ng k hnh ngh? ?? gip qu v? ??a ra m?t k? ho?ch ?n u?ng ph h?p.  Gi?m cn n?u qu v? th?a cn.  Mang theo th? c?nh bo y t? ho?c ?eo ?? trang s?c c c?nh bo y t?.  Mang theo ?? ?n nh? ch?a 15 gam carbohydrate m?i lc ?? ?i?u tr? h? ???ng huy?t (h? ???ng huy?t). M?t s? v d? v? ?? ?n nh? ch?a 15 gam carbohydrate bao g?m:  Vin glucose, 3 ho?c 4.  Gel glucose, ?ng 15 gam.  Nho kh, 2 mu?ng (24 gam).  Th?ch hnh h?t ??u, 6.  Bnh quy hnh con gi?ng, 8.  N??c u?ng c ga thng th??ng, 4 aox? (120 ml)  K?o chp chp, 9.  Nh?n bi?t h? ???ng huy?t. H? ???ng huy?t x?y ra khi l??ng ???ng huy?t t? 70 mg/dL tr? xu?ng. Nguy c? h? ???ng huy?t gia t?ng khi nh?n ?n ho?c b? b?a, trong v sau khi t?p th? d?c c??ng ?? cao v trong khi   ng?. Cc tri?u ch?ng h? ???ng huy?t c th? bao g?m:  Run ho?c l?c.  Gi?m kh? n?ng t?p trung.  ?? m? hi.  Nh?p tim t?ng.  ?au ??u.  Kh mi?ng.  ?i.  D? b? kch thch.  Lo u.  Ng? khng yn.  Thay ??i l?i ni ho?c s? ph?i h?p.  B? l l?n.  ?i?u tr? h? ???ng huy?t k?p th?i. N?u qu v?  t?nh to v c th? nu?t m?t cch an ton, hy theo quy t?c 15:15:  Dng 15-20 gam glucose ho?c carbohydrate c tc d?ng nhanh. L?a ch?n tc ??ng nhanh bao g?m gel glucose, vin glucose ho?c 4 aox? (120 ml) n??c p tri cy, soda bnh th??ng ho?c s?a t bo.  Ki?m tra l??ng ???ng huy?t c?a qu v? 15 pht sau khi u?ng glucose.  Dng t? 15-20 gam glucose tr? ln n?u l??ng ???ng huy?t ???c ?o l?i v?n ? m?c 70 mg/dL tr? xu?ng.  ?n theo b?a ?n bnh th??ng ho?c ?? ?n nh? trong vng 1 ti?ng sau khi l??ng ???ng huy?t tr? l?i bnh th??ng.  Hy c?nh gic v?i c?m gic r?t kht v ?i ti?u ti?n nhi?u l?n h?n bnh th??ng v ?y l nh?ng d?u hi?u s?m c?a t?ng ???ng huy?t. Vi?c pht hi?n t?ng ???ng huy?t s?m cho php ?i?u tr? k?p th?i. ?i?u tr? t?ng ???ng huy?t theo ch? d?n c?a chuyn gia ch?m sc s?c kh?e.  M?i tu?n tham gia vo t nh?t 150 pht ho?t ??ng thn th? v?i c??ng ?? trung bnh, phn b? trong t nh?t 3 ngy trong tu?n ho?c theo ch? d?n c?a chuyn gia ch?m sc s?c kh?e. Ngoi ra, qu v? nn tham gia vo bi t?p c s?c c?n t nh?t 2 l?n m?t tu?n ho?c theo ch? d?n c?a chuyn gia ch?m sc s?c kh?e. C? g?ng dnh khng qu 90 pht m?i l?n khng ho?t ??ng.  ?i?u ch?nh thu?c v l??ng th?c ?n khi c?n n?u qu v? b?t ??u m?t bi t?p ho?c m?t mn th? thao m?i.  Lm theo k? ho?ch trong ngy b? b?nh c?a qu v? b?t c? lc no m qu v? khng th? ?n ho?c u?ng nh? bnh th??ng.  Khng s? d?ng cc s?n ph?m thu?c l bao g?m thu?c l ht, thu?c l d?ng nhai ho?c thu?c l ?i?n t?. N?u qu v? c?n gip ?? ?? cai thu?c, hy h?i chuyn gia ch?m sc s?c kh?e.  Gi?i h?n l??ng r??u qu v? u?ng khng qu 1 ly m?i ngy v?i ph? n? khng mang thai v 2 ly m?i ngy v?i nam gi?i. Qu v? ch? nn u?ng r??u khi ?n. Ni chuy?n v?i chuyn gia ch?m sc s?c kh?e xem u?ng r??u c an ton cho qu v? hay khng. Cho chuyn gia ch?m sc s?c kh?e bi?t n?u qu v? u?ng r??u vi l?n m?i tu?n.  Tun th? m?i cu?c h?n khm l?i theo ch? d?n c?a chuyn  gia ch?m sc s?c kh?e. ?i?u ny l quan tr?ng.  S?p x?p bu?i khm m?t ngay sau khi ch?n ?on b?nh ti?u ???ng tp 2 v sau ? l hng n?m.  Th?c hi?n ch?m sc da v bn chn hng ngy. Ki?m tra da v bn chn hng ngy xem c v?t c?t, v?t b?m tm, t?y ??, v?n ?? v? mng, ch?y mu, m?n n??c hay l? lot khng. Bn chn c?n ???c chuyn gia ch?m sc s?c kh?e khm hng n?m.  ?nh r?ng v l?i t nh?t hai l?n m?i ngy   v dng ch? nha khoa t nh?t m?t l?n m?i ngy. G?p nha s? ?? khm l?i th??ng xuyn.  Chia s? k? ho?ch qu?n l b?nh ti?u ???ng c?a qu v? ? n?i lm vi?c ho?c tr??ng h?c c?a qu v?.  C?p nh?t l?ch tim ch?ng c?a qu v?. Qu v? nn tim v?c xin cm (b?nh cm) hng n?m. Qu v? c?ng nn tim v?c xin vim ph?i (ph? c?u khu?n). N?u qu v? 65 tu?i tr? ln v ch?a ???c tim v?c xin vim ph?i, v?c xin ny c th? ???c tim m?t lo?t hai m?i ring. Hy h?i chuyn gia ch?m sc s?c kh?e xem nn tim thm lo?i v?c xin no.  H?c cch qu?n l c?ng th?ng.  Xin ???c gio d?c v h? tr? v? b?nh ti?u ???ng th??ng xuyn khi c?n.  Tham gia ho?c tm cch ph?c h?i ch?c n?ng khi c?n thi?t ?? duy tr ho?c c?i thi?n kh? n?ng ??c l?p v ch?t l??ng cu?c s?ng. Yu c?u chuy?n sang v?t l tr? li?u ho?c li?u php ngh? nghi?p n?u qu v? b? t bn chn ho?c t tay, ho?c kh ch?i ??u, kh m?c qu?n o, ?n u?ng ho?c ho?t ??ng th? ch?t. ?I KHM N?U:   Qu v? khng th? ?n ho?c u?ng trong h?n 6 ti?ng.  Qu v? b? bu?n nn v nn m?a trong h?n 6 ti?ng.  L??ng ???ng huy?t c?a qu v? cao trn 240 mg/dL.  C thay ??i tr?ng thi tinh th?n.  Qu v? b? thm m?t c?n b?nh nghim tr?ng.  Qu v? b? tiu ch?y trong h?n 6 ti?ng.  Qu v? ? b? ?m ho?c b? s?t trong m?t vi ngy v khng ?? h?n.  Qu v? b? ?au trong khi tham gia b?t k? ho?t ??ng thn th? no. NGAY L?P T?C ?I KHM N?U:  Qu v? b? kh th?.  Qu v? c l??ng ketone ? m?c trung bnh ??n cao.   Thng tin ny khng nh?m m?c ?ch thay th? cho l?i khuyn m chuyn gia ch?m  sc s?c kh?e ni v?i qu v?. Hy b?o ??m qu v? ph?i th?o lu?n b?t k? v?n ?? g m qu v? c v?i chuyn gia ch?m sc s?c kh?e c?a qu v?.   Document Released: 12/29/2004 Document Revised: 09/19/2014 Elsevier Interactive Patient Education 2016 [employer]  

## 2015-05-23 NOTE — Progress Notes (Signed)
Patient ID: Aimee Parker, female   DOB: 1950-02-19, 65 y.o.   MRN: [id number]   Aimee Parker, is a 65 y.o. female  [id number]  [id number]  DOB - Dec 19, 1950  Chief Complaint  Patient presents with  . Follow-up    DM        Subjective:   Aimee Parker is a 65 y.o. female with history of HTN, T2DM requiring insulin and hypertriglyceridemia who recently returned from a 3 month's visit to her country [place] here today for a follow up visit and medication refills. Patient has no new complaint. She claims she is doing very well, trying to eat healthier, compliant with medications. Patient has No headache, No chest pain, No abdominal pain - No Nausea, No new weakness tingling or numbness, No Cough - SOB.  No problems updated.  ALLERGIES: No Known Allergies  PAST MEDICAL HISTORY: Past Medical History  Diagnosis Date  . Hypertension     MEDICATIONS AT HOME: Prior to Admission medications   Medication Sig Start Date End Date Taking? Authorizing Provider  BD INSULIN SYRINGE ULTRAFINE 31G X 15/64" 0.3 ML MISC USE TO INJECT INSULIN AS DIRECTED BY PHYSICIAN 11/21/14   Tresa Garter, MD  Blood Glucose Monitoring Suppl (TRUE METRIX METER) W/DEVICE KIT 1 each by Does not apply route 3 (three) times daily. 11/15/14   Tresa Garter, MD  fenofibrate 160 MG tablet Take 1 tablet (160 mg total) by mouth daily. 02/14/15   Tresa Garter, MD  glucose blood (TRUE METRIX BLOOD GLUCOSE TEST) test strip Use as instructed 02/14/15   Tresa Garter, MD  hydrochlorothiazide (HYDRODIURIL) 25 MG tablet Take 1 tablet (25 mg total) by mouth daily. 02/14/15   Tresa Garter, MD  insulin NPH-regular Human (NOVOLIN 70/30) (70-30) 100 UNIT/ML injection Inject 20 Units into the skin 2 (two) times daily with a meal. 05/23/15   Tresa Garter, MD  lisinopril (PRINIVIL,ZESTRIL) 40 MG tablet Take 1 tablet (40 mg total) by mouth daily. 02/14/15   Tresa Garter, MD  meclizine (ANTIVERT) 25 MG  tablet Take 1 tablet (25 mg total) by mouth 3 (three) times daily as needed for dizziness. Patient not taking: Reported on 02/14/2015 11/30/12   [name], NP  metFORMIN (GLUCOPHAGE) 1000 MG tablet Take 1 tablet (1,000 mg total) by mouth 2 (two) times daily with a meal. 02/14/15   Tresa Garter, MD  TRUEPLUS LANCETS 28G MISC Test Blood Sugar 3 times daily 02/14/15   Tresa Garter, MD  Vitamin D, Ergocalciferol, (DRISDOL) 50000 UNITS CAPS capsule Take 1 capsule (50,000 Units total) by mouth every 7 (seven) days. Patient not taking: Reported on 02/14/2015 04/04/13   Tresa Garter, MD     Objective:   Filed Vitals:   05/23/15 1116  BP: 165/81  Pulse: 75  Temp: 98.7 F (37.1 C)  TempSrc: Oral  Resp: 18  Height: 5' 1"  (1.549 m)  Weight: 136 lb 6.4 oz (61.871 kg)  SpO2: 96%    Exam General appearance : Awake, alert, not in any distress. Speech Clear. Not toxic looking HEENT: Atraumatic and Normocephalic, pupils equally reactive to light and accomodation Neck: supple, no JVD. No cervical lymphadenopathy.  Chest:Good air entry bilaterally, no added sounds  CVS: S1 S2 regular, + systolic murmurs.  Abdomen: Bowel sounds present, Non tender and not distended with no gaurding, rigidity or rebound. Extremities: B/L Lower Ext shows no edema, both legs are warm to touch Neurology: Awake alert, and oriented X  3, CN II-XII intact, Non focal Skin: No Rash  Data Review Lab Results  Component Value Date   HGBA1C 9.0 11/15/2014   HGBA1C 9.60 05/17/2014   HGBA1C 9.5 12/14/2013     Assessment & Plan   1. Type 2 diabetes mellitus without complication, unspecified long term insulin use status (HCC)  - insulin NPH-regular Human (NOVOLIN 70/30) (70-30) 100 UNIT/ML injection; Inject 20 Units into the skin 2 (two) times daily with a meal.  Dispense: 30 mL; Refill: 3  Aim for 30 minutes of exercise most days. Rethink what you drink. Water is great! Aim for 2-3 Carb Choices per meal  (30-45 grams) +/- 1 either way  Aim for 0-15 Carbs per snack if hungry  Include protein in moderation with your meals and snacks  Consider reading food labels for Total Carbohydrate and Fat Grams of foods  Consider checking BG at alternate times per day  Continue taking medication as directed Be mindful about how much sugar you are adding to beverages and other foods. Fruit Punch - find one with no sugar  Measure and decrease portions of carbohydrate foods  Make your plate and don't go back for seconds  2. Essential hypertension  We have discussed target BP range and blood pressure goal. I have advised patient to check BP regularly and to call us back or report to clinic if the numbers are consistently higher than 140/90. We discussed the importance of compliance with medical therapy and DASH diet recommended, consequences of uncontrolled hypertension discussed.   - continue current BP medications  3. Hypertriglyceridemia  To address this please limit saturated fat to no more than 7% of your calories, limit cholesterol to 200 mg/day, increase fiber and exercise as tolerated. If needed we may add another cholesterol lowering medication to your regimen.   Patient have been counseled extensively about nutrition and exercise  Return in about 3 months (around 08/23/2015) for Hemoglobin A1C and Follow up, DM, Follow up HTN.  The patient was given clear instructions to go to ER or return to medical center if symptoms don't improve, worsen or new problems develop. The patient verbalized understanding. The patient was told to call to get lab results if they haven't heard anything in the next week.   This note has been created with Surveyor, quantity. Any transcriptional errors are unintentional.    Angelica Chessman, MD, [place], Karilyn Cota, [place] and [facility], [place]   05/23/2015, 11:17 AM

## 2015-05-23 NOTE — Progress Notes (Signed)
Patient is here for FU  Patient denies pain at this time.  Patient has taken medication today and patein has eaten.  Patient tolerated 20 units of insulin well.

## 2015-05-31 ENCOUNTER — Ambulatory Visit: Payer: Self-pay | Attending: Internal Medicine

## 2015-06-18 MED FILL — NOVOLIN 70/30 100 UNITS/ML: (70-30) 100 | 25 days supply | Qty: 10 | Fill #4

## 2015-07-17 MED FILL — NOVOLIN 70/30 100 UNITS/ML: (70-30) 100 | 25 days supply | Qty: 10 | Fill #5

## 2015-08-15 MED FILL — HYDROCHLOROTHIAZIDE 25 MG T: 25 | 90 days supply | Qty: 90 | Fill #2

## 2015-08-15 MED FILL — ?METFORMIN HCL 1,000 MG TAB: 1000 | 90 days supply | Qty: 180 | Fill #2

## 2015-08-15 MED FILL — FENOFIBRATE 160 MG TABLET: 160 | 90 days supply | Qty: 90 | Fill #2

## 2015-08-15 MED FILL — ?LISINOPRIL 40 MG TABLET: 40 MG | 90 days supply | Qty: 90 | Fill #2

## 2015-08-15 MED FILL — NOVOLIN 70/30 100 UNITS/ML: (70-30) 100 | 25 days supply | Qty: 10 | Fill #6

## 2015-08-29 ENCOUNTER — Ambulatory Visit: Payer: Medicaid Other | Attending: Internal Medicine | Admitting: Internal Medicine

## 2015-08-29 ENCOUNTER — Encounter: Payer: Self-pay | Admitting: Internal Medicine

## 2015-08-29 VITALS — BP 181/70 | HR 59 | Temp 98.4°F | Resp 18 | Ht 60.0 in | Wt 132.6 lb

## 2015-08-29 DIAGNOSIS — Z79899 Other long term (current) drug therapy: Secondary | ICD-10-CM | POA: Diagnosis not present

## 2015-08-29 DIAGNOSIS — E781 Pure hyperglyceridemia: Secondary | ICD-10-CM | POA: Diagnosis not present

## 2015-08-29 DIAGNOSIS — Z0000 Encounter for general adult medical examination without abnormal findings: Secondary | ICD-10-CM | POA: Diagnosis not present

## 2015-08-29 DIAGNOSIS — Z794 Long term (current) use of insulin: Secondary | ICD-10-CM | POA: Insufficient documentation

## 2015-08-29 DIAGNOSIS — E119 Type 2 diabetes mellitus without complications: Secondary | ICD-10-CM | POA: Diagnosis present

## 2015-08-29 DIAGNOSIS — Z23 Encounter for immunization: Secondary | ICD-10-CM | POA: Insufficient documentation

## 2015-08-29 DIAGNOSIS — Z1211 Encounter for screening for malignant neoplasm of colon: Secondary | ICD-10-CM

## 2015-08-29 DIAGNOSIS — I10 Essential (primary) hypertension: Secondary | ICD-10-CM

## 2015-08-29 LAB — POCT GLYCOSYLATED HEMOGLOBIN (HGB A1C): Hemoglobin A1C: 8.2

## 2015-08-29 LAB — GLUCOSE, POCT (MANUAL RESULT ENTRY): POC Glucose: 97 mg/dl (ref 70–99)

## 2015-08-29 MED ORDER — FENOFIBRATE 160 MG PO TABS: 160.0000 mg | ORAL_TABLET | Freq: Every day | ORAL | 3 refills | Status: DC

## 2015-08-29 MED ORDER — LISINOPRIL 40 MG PO TABS: 40.0000 mg | ORAL_TABLET | Freq: Every day | ORAL | 3 refills | Status: DC

## 2015-08-29 MED ORDER — METFORMIN HCL 1000 MG PO TABS: 1000.0000 mg | ORAL_TABLET | Freq: Two times a day (BID) | ORAL | 3 refills | Status: DC

## 2015-08-29 MED ORDER — GLUCOSE BLOOD VI STRP: ORAL_STRIP | 12 refills | Status: DC

## 2015-08-29 MED ORDER — "INSULIN SYRINGE-NEEDLE U-100 31G X 15/64"" 0.3 ML MISC": 2 refills | Status: DC

## 2015-08-29 MED ORDER — INSULIN NPH ISOPHANE & REGULAR (70-30) 100 UNIT/ML ~~LOC~~ SUSP: 20.0000 [IU] | Freq: Two times a day (BID) | SUBCUTANEOUS | 3 refills | Status: DC

## 2015-08-29 MED ORDER — HYDROCHLOROTHIAZIDE 25 MG PO TABS: 25.0000 mg | ORAL_TABLET | Freq: Every day | ORAL | 3 refills | Status: DC

## 2015-08-29 MED FILL — TRUE METRIX TEST STRIP: 30 days supply | Qty: 100 | Fill #0

## 2015-08-29 MED FILL — NOVOLIN 70/30 100 UNITS/ML: (70-30) 100 | 25 days supply | Qty: 10 | Fill #0

## 2015-08-29 NOTE — Progress Notes (Signed)
Patient is here for DM HTN FU  Patient denies pain at this time.  Patient has taken medication and patient has eaten today.  Patient would like her flu vaccine today. Patient tolerated the flu vaccine well today.

## 2015-08-29 NOTE — Progress Notes (Signed)
Aimee Parker, is a 65 y.o. female  [id number]  [id number]  DOB - March 13, 1950  Chief Complaint  Patient presents with  . Diabetes      Subjective:   Aimee Parker is a 65 y.o. female with history of hypertension, type 2 diabetes mellitus requiring insulin and hypertriglyceridemia here today for a follow up visit and medication refills. Patient claims that she is doing very well, she has no new complaint today. She has not particularly been adherent with medications due to financial resource limitation, patient has been taking medications sparingly to allow the quantity she could afford last longer. For this reason her blood sugar and blood pressure as suboptimally controlled. Patient has No headache, No chest pain, No abdominal pain - No Nausea, No new weakness tingling or numbness, No Cough - SOB. Patient is due for podiatry and ophthalmology evaluation as part of her diabetic management.  ALLERGIES: No Known Allergies  PAST MEDICAL HISTORY: Past Medical History:  Diagnosis Date  . Hypertension    MEDICATIONS AT HOME: Prior to Admission medications   Medication Sig Start Date End Date Taking? Authorizing Provider  Blood Glucose Monitoring Suppl (TRUE METRIX METER) W/DEVICE KIT 1 each by Does not apply route 3 (three) times daily. 11/15/14  Yes Tresa Garter, MD  fenofibrate 160 MG tablet Take 1 tablet (160 mg total) by mouth daily. 08/29/15  Yes Tresa Garter, MD  glucose blood (TRUE METRIX BLOOD GLUCOSE TEST) test strip Use as instructed 08/29/15  Yes Karmelo Bass E Doreene Burke, MD  hydrochlorothiazide (HYDRODIURIL) 25 MG tablet Take 1 tablet (25 mg total) by mouth daily. 08/29/15  Yes Tresa Garter, MD  insulin NPH-regular Human (NOVOLIN 70/30) (70-30) 100 UNIT/ML injection Inject 20 Units into the skin 2 (two) times daily with a meal. 08/29/15  Yes Tresa Garter, MD  Insulin Syringe-Needle U-100 (BD INSULIN SYRINGE ULTRAFINE) 31G X 15/64" 0.3 ML MISC USE TO INJECT  INSULIN AS DIRECTED BY PHYSICIAN 08/29/15  Yes Tresa Garter, MD  lisinopril (PRINIVIL,ZESTRIL) 40 MG tablet Take 1 tablet (40 mg total) by mouth daily. 08/29/15  Yes Tresa Garter, MD  metFORMIN (GLUCOPHAGE) 1000 MG tablet Take 1 tablet (1,000 mg total) by mouth 2 (two) times daily with a meal. 08/29/15  Yes Tresa Garter, MD  TRUEPLUS LANCETS 28G MISC Test Blood Sugar 3 times daily 02/14/15  Yes Tresa Garter, MD  Vitamin D, Ergocalciferol, (DRISDOL) 50000 UNITS CAPS capsule Take 1 capsule (50,000 Units total) by mouth every 7 (seven) days. 04/04/13  Yes Tresa Garter, MD  meclizine (ANTIVERT) 25 MG tablet Take 1 tablet (25 mg total) by mouth 3 (three) times daily as needed for dizziness. Patient not taking: Reported on 08/29/2015 11/30/12   [name], NP    Objective:   Vitals:   08/29/15 1215  BP: (!) 181/70  Pulse: (!) 59  Resp: 18  Temp: 98.4 F (36.9 C)  TempSrc: Oral  SpO2: 97%  Weight: 132 lb 9.6 oz (60.1 kg)  Height: 5' (1.524 m)   Exam General appearance : Awake, alert, not in any distress. Speech Clear. Not toxic looking HEENT: Atraumatic and Normocephalic, pupils equally reactive to light and accomodation Neck: Supple, no JVD. No cervical lymphadenopathy.  Chest: Good air entry bilaterally, no added sounds  CVS: S1 S2 regular, no murmurs.  Abdomen: Bowel sounds present, Non tender and not distended with no gaurding, rigidity or rebound. Extremities: B/L Lower Ext shows no edema, both legs are warm to  touch Neurology: Awake alert, and oriented X 3, CN II-XII intact, Non focal Skin: No Rash  Data Review  Lab Results  Component Value Date   HGBA1C 8.2 08/29/2015   HGBA1C 9.6 (H) 05/23/2015   HGBA1C 9.0 11/15/2014   Assessment & Plan   1. Type 2 diabetes mellitus without complication, unspecified long term insulin use status (HCC)  - Glucose (CBG) - POCT A1C Improving...Marland KitchenMarland KitchenMarland Kitchen Continue current regimen - metFORMIN (GLUCOPHAGE) 1000 MG  tablet; Take 1 tablet (1,000 mg total) by mouth 2 (two) times daily with a meal.  Dispense: 180 tablet; Refill: 3 - insulin NPH-regular Human (NOVOLIN 70/30) (70-30) 100 UNIT/ML injection; Inject 20 Units into the skin 2 (two) times daily with a meal.  Dispense: 30 mL; Refill: 3 - glucose blood (TRUE METRIX BLOOD GLUCOSE TEST) test strip; Use as instructed  Dispense: 100 each; Refill: 12   - Ambulatory referral to Podiatry - Ambulatory referral to Ophthalmology  2. Healthcare maintenance  - Flu Vaccine QUAD 36+ mos PF IM (Fluarix & Fluzone Quad PF)  3. Essential hypertension: Uncontrolled Refill - lisinopril (PRINIVIL,ZESTRIL) 40 MG tablet; Take 1 tablet (40 mg total) by mouth daily.  Dispense: 90 tablet; Refill: 3 - hydrochlorothiazide (HYDRODIURIL) 25 MG tablet; Take 1 tablet (25 mg total) by mouth daily.  Dispense: 90 tablet; Refill: 3  We have discussed target BP range and blood pressure goal. I have advised patient to check BP regularly and to call us back or report to clinic if the numbers are consistently higher than 140/90. We discussed the importance of compliance with medical therapy and DASH diet recommended, consequences of uncontrolled hypertension discussed.  - continue current BP medications  4. Hypertriglyceridemia Refill - fenofibrate 160 MG tablet; Take 1 tablet (160 mg total) by mouth daily.  Dispense: 90 tablet; Refill: 3  To address this please limit saturated fat to no more than 7% of your calories, limit cholesterol to 200 mg/day, increase fiber and exercise as tolerated. If needed we may add another cholesterol lowering medication to your regimen.   Patient have been counseled extensively about nutrition and exercise  Return in about 3 months (around 11/29/2015) for Hemoglobin A1C and Follow up, DM, Follow up HTN.  Interpreter was used to communicate directly with patient for the entire encounter including providing detailed patient instructions.   The patient  was given clear instructions to go to ER or return to medical center if symptoms don't improve, worsen or new problems develop. The patient verbalized understanding. The patient was told to call to get lab results if they haven't heard anything in the next week.   This note has been created with Surveyor, quantity. Any transcriptional errors are unintentional.    Angelica Chessman, MD, [place], Karilyn C[place], [place] [facility] [place], [place]   08/29/2015, 12:39 PM

## 2015-09-18 ENCOUNTER — Ambulatory Visit (INDEPENDENT_AMBULATORY_CARE_PROVIDER_SITE_OTHER): Payer: Medicaid Other | Admitting: Podiatry

## 2015-09-18 DIAGNOSIS — E119 Type 2 diabetes mellitus without complications: Secondary | ICD-10-CM

## 2015-09-18 NOTE — Progress Notes (Signed)
   Subjective:    Patient ID: Aimee Parker, female    DOB: [birth year], 65 y.o.   MRN: [id number]  HPI    Review of Systems  All other systems reviewed and are negative.      Objective:   Physical Exam        Assessment & Plan:

## 2015-09-18 NOTE — Progress Notes (Signed)
Patient ID: Aimee Parker, female   DOB: 1950/06/20, 65 y.o.   MRN: [id number] SUBJECTIVE Patient with a history of diabetes mellitus presents to office today. She is referred today by her primary care doctor. Patient is a diabetic and here for routine foot exam. Her blood sugar this morning was 120 mg/dL. Patient presents today with her husband.  No Known Allergies  OBJECTIVE General Patient is awake, alert, and oriented x 3 and in no acute distress. Derm Skin is dry and supple bilateral. Negative open lesions or macerations. Remaining integument unremarkable.  Vasc  DP and PT pedal pulses palpable bilaterally. Temperature gradient within normal limits.  Neuro Epicritic and protective threshold sensation diminished bilaterally.  Musculoskeletal Exam No symptomatic pedal deformities noted bilateral. Muscular strength within normal limits.  ASSESSMENT 1. Diabetes Mellitus w/out complications of peripheral neuropathy   PLAN OF CARE #1 the patient was evaluated #2 for comprehensive diabetic exam was performed to the bilateral lower extremities #3 patient is to return on an annual basis for routine diabetic check. Felecia ShellingBrent M Evans, DPM

## 2015-10-30 ENCOUNTER — Encounter: Payer: Self-pay | Admitting: Internal Medicine

## 2015-10-31 MED FILL — metFORMIN HCL 1000 MG TABS: 1000 | 90 days supply | Qty: 180 | Fill #3

## 2015-10-31 MED FILL — HYDROCHLOROTHIAZIDE 25 MG T: 25 | 90 days supply | Qty: 90 | Fill #3

## 2015-10-31 MED FILL — FENOFIBRATE 160 MG TABLET: 160 | 90 days supply | Qty: 90 | Fill #3

## 2015-11-11 MED FILL — LISINOPRIL 40 MG TABLET: 40 | 90 days supply | Qty: 90 | Fill #3

## 2015-11-28 ENCOUNTER — Ambulatory Visit: Payer: Medicaid Other | Attending: Internal Medicine | Admitting: Internal Medicine

## 2015-11-28 ENCOUNTER — Encounter: Payer: Self-pay | Admitting: Internal Medicine

## 2015-11-28 VITALS — BP 186/41 | HR 60 | Temp 98.3°F | Resp 18 | Ht 61.0 in | Wt 129.0 lb

## 2015-11-28 DIAGNOSIS — I10 Essential (primary) hypertension: Secondary | ICD-10-CM | POA: Insufficient documentation

## 2015-11-28 DIAGNOSIS — Z794 Long term (current) use of insulin: Secondary | ICD-10-CM | POA: Insufficient documentation

## 2015-11-28 DIAGNOSIS — Z79899 Other long term (current) drug therapy: Secondary | ICD-10-CM | POA: Insufficient documentation

## 2015-11-28 DIAGNOSIS — E119 Type 2 diabetes mellitus without complications: Secondary | ICD-10-CM | POA: Insufficient documentation

## 2015-11-28 DIAGNOSIS — E781 Pure hyperglyceridemia: Secondary | ICD-10-CM | POA: Diagnosis not present

## 2015-11-28 LAB — COMPLETE METABOLIC PANEL WITH GFR
ALT: 24 U/L (ref 6–29)
AST: 34 U/L (ref 10–35)
Albumin: 4.7 g/dL (ref 3.6–5.1)
Alkaline Phosphatase: 44 U/L (ref 33–130)
BUN: 27 mg/dL — AB (ref 7–25)
CALCIUM: 10.1 mg/dL (ref 8.6–10.4)
CHLORIDE: 100 mmol/L (ref 98–110)
CO2: 24 mmol/L (ref 20–31)
CREATININE: 0.88 mg/dL (ref 0.50–0.99)
GFR, Est African American: 80 mL/min (ref >=60)
GFR, Est Non African American: 69 mL/min (ref >=60)
Glucose, Bld: 85 mg/dL (ref 65–99)
Potassium: 4.5 mmol/L (ref 3.5–5.3)
Sodium: 137 mmol/L (ref 135–146)
Total Bilirubin: 0.4 mg/dL (ref 0.2–1.2)
Total Protein: 8.1 g/dL (ref 6.1–8.1)

## 2015-11-28 LAB — LIPID PANEL
CHOLESTEROL: 208 mg/dL — AB (ref <200)
HDL: 103 mg/dL (ref >50)
LDL CALC: 90 mg/dL (ref <100)
Total CHOL/HDL Ratio: 2 Ratio (ref <5.0)
Triglycerides: 73 mg/dL (ref <150)
VLDL: 15 mg/dL (ref <30)

## 2015-11-28 LAB — POCT GLYCOSYLATED HEMOGLOBIN (HGB A1C): HEMOGLOBIN A1C: 7.8

## 2015-11-28 MED ORDER — METFORMIN HCL 1000 MG PO TABS: 1000.0000 mg | ORAL_TABLET | Freq: Two times a day (BID) | ORAL | 3 refills | Status: DC

## 2015-11-28 MED ORDER — INSULIN NPH ISOPHANE & REGULAR (70-30) 100 UNIT/ML ~~LOC~~ SUSP: 20.0000 [IU] | Freq: Two times a day (BID) | SUBCUTANEOUS | 3 refills | Status: DC

## 2015-11-28 MED ORDER — AMLODIPINE BESYLATE 10 MG PO TABS: 10.0000 mg | ORAL_TABLET | Freq: Every day | ORAL | 3 refills | Status: DC

## 2015-11-28 MED ORDER — LISINOPRIL 40 MG PO TABS: 40.0000 mg | ORAL_TABLET | Freq: Every day | ORAL | 3 refills | Status: DC

## 2015-11-28 MED ORDER — HYDROCHLOROTHIAZIDE 25 MG PO TABS: 25.0000 mg | ORAL_TABLET | Freq: Every day | ORAL | 3 refills | Status: DC

## 2015-11-28 MED ORDER — FENOFIBRATE 160 MG PO TABS: 160.0000 mg | ORAL_TABLET | Freq: Every day | ORAL | 3 refills | Status: DC

## 2015-11-28 MED FILL — AMLODIPINE BESYLATE 10 MG T: 10 | 60 days supply | Qty: 60 | Fill #0

## 2015-11-28 MED FILL — AMLODIPINE BESYLATE 10 MG T: 10 | 60 days supply | Qty: 60 | Fill #1

## 2015-11-28 NOTE — Patient Instructions (Signed)
T?ng huy?t p (Hypertension) T?ng huy?t p, th??ng ???c g?i l huy?t p cao, l khi l?c b?m mu qua ??ng m?ch c?a qu v? qu m?nh. ??ng m?ch c?a qu v? l cc m?ch mu mang mu t? tim ?i kh?p c? th? c?a qu v?. K?t qu? ?o huy?t p c m?t con s? cao v m?t con s? th?p, ch?ng h?n nh? 110/72. Con s? cao (tm thu) l p l?c bn trong ??ng m?ch khi tim qu v? b?m. Con s? th?p (tm tr??ng) l p l?c bn trong ??ng m?ch khi tim qu v? gin ra. Huy?t p l t??ng c?n cho qu v? ph?i d??i 120/80. Ch?ng t?ng huy?t p bu?c tim qu v? ph?i lm vi?c v?t v? h?n ?? b?m mu. ??ng m?ch c?a qu v? c th? b? h?p ho?c c?ng. Huy?t p cao khng ???c ?i?u tr? ho?c khng ???c ki?m sot c th? d?n t?i nh?i mu c? tim, ??t qu?, b?nh th?n v nh?ng v?n ?? khc. CC Y?U T? NGUY C? M?t s? y?u t? nguy c? d?n ??n huy?t p cao c th? ki?m sot ???c. M?t s? y?u t? khc th khng. Nh?ng y?u t? nguy c? khng th? ki?m sot ???c bao g?m:  Ch?ng t?c. Qu v? c nguy c? cao h?n n?u qu v? l ng??i M? g?c Phi.  ?? tu?i. Nguy c? t?ng ln theo ?? tu?i.  Gi?i tnh. Nam gi?i c nguy c? cao h?n ph? n? tr??c tu?i 45. Sau tu?i 65, ph? n? c nguy c? cao h?n nam gi?i. Nh?ng y?u t? nguy c? c th? ki?m sot ???c bao g?m:  Khng t?p th? d?c ho?c cc ho?t ??ng th? ch?t ??y ??Marland Kitchen  Th?a cn.  ?n qu nhi?u ch?t bo, ???ng, ca-lo, ho?c mu?i.  U?ng qu nhi?u r??u. D?U HI?U V TRI?U CH?NG T?ng huy?t p th??ng khng gy ra d?u hi?u ho?c tri?u ch?ng. Huy?t p r?t cao (c?n cao huy?t p) c th? gy ?au ??u, lo l?ng, kh th? v ch?y mu cam. CH?N ?ON ?? ki?m tra xem qu v? c t?ng huy?t p khng, chuyn gia ch?m [place] s?c kh?e c?a qu v? s? ?o huy?t p trong khi qu v? ng?i ??t tay ? m?c ngang v?i tim. Huy?t p c?n ???c ?o t nh?t hai l?n trn cng m?t cnh tay. M?t s? tnh tr?ng nh?t ??nh c th? lm cho huy?t p khc nhau gi?a tay ph?i v tay tri c?a qu v?. K?t qu? ?o huy?t p cao h?n bnh th??ng ? m?t th?i ?i?m no ? khng c ngh?a l qu v? c?n ?i?u tr?Marland Kitchen  N?u khng r li?u qu v? c huy?t p cao hay khng, qu v? c th? ???c ?? ngh? tr? l?i vo m?t ngy khc ?? ki?m tra l?i huy?t p. Ho?c qu v? c th? ???c yu c?u theo di huy?t p ? nh trong 1 tu?n ho?c h?n. ?I?U TR? ?i?u tr? huy?t p cao gao g?m thay ??i l?i s?ng v c th? ph?i dng thu?c. C m?t l?i s?ng lnh m?nh c th? gip lm gi?m huy?t p cao. Qu v? c th? c?n thay ??i m?t s? thi quen. Thay ??i l?i s?ng c th? bao g?m:  Th?c hi?n ch? ?? ?n DASH. Ch? ?? ?n ny c nhi?u tri cy, rau v ng? c?c nguyn h?t. C t mu?i, th?t ??, v t b? sung ???ng.  Duy tr l??ng mu?i tiu th? d??i 2.300 mg m?i ngy.  T?p aerobic t nh?t 30-45 pht t nh?t 4  l?n m?i tu?n.  Gi?m cn n?u c?n thi?t.  Khng ht thu?c.  H?n ch? ?? u?ng c c?n.  H?c cc cch gi?m c?ng th?ng. Chuyn gia ch?m [place] s?c kh?e c th? k ??n thu?c n?u thay ??i l?i s?ng khng ?? ?? ??a huy?t p v? m?c c th? ki?m sot ???c v n?u m?t trong nh?ng ?i?u sau l ?ng:  Qu v? t? 18-59 tu?i v huy?t p tm thu c?a qu v? trn 140.  Qu v? t? 60 tu?i tr? ln v huy?t p tm thu c?a qu v? trn 150.  Huy?t p tm tr??ng c?a qu v? trn 90.  Qu v? b? ti?u ???ng v huy?t p tm thu c?a qu v? trn 140 ho?c huy?t p tm tr??ng c?a qu v? trn 90.  Qu v? b? b?nh th?n v huy?t p qu v? trn 140/90.  Qu v? b? b?nh tim v huy?t p qu v? trn 140/90. Huy?t p m?c tiu c nhn c?a qu v? c th? khc nhau ty thu?c v tnh tr?ng b?nh l, tu?i v cc nhn t? khc. H??NG D?N CH?M [place] T?I NH  Ki?m tra l?i huy?t p c?a qu v? theo ch? d?n c?a chuyn gia ch?m [place] s?c kh?e.  Ch? s? d?ng thu?c theo ch? d?n c?a chuyn gia ch?m [place] s?c kh?e. Lm theo ch? d?n m?t cch c?n th?n. Thu?c ?i?u tr? huy?t p ph?i ???c dng theo ??n ? k. Thu?c c?ng s? khng c tc d?ng khi qu v? b? li?u. Vi?c b? li?u thu?c c?ng lm qu v? c nguy c? pht sinh v?n ??Imagene Sheller.  Khng ht thu?c.  Theo di huy?t p c?a qu v? ? nh theo ch? d?n c?a chuyn gia ch?m [place] s?c  kh?e. ?I KHM N?U:  Qu v? ngh? qu v? c ph?n ?ng v?i thu?c ?ang dng.  Qu v? b? ?au ??u ho?c c?m th?y chng m?t ti di?n.  Qu v? b? s?ng ph ? m?t c chn.  Qu v? c v?n ?? v? th? l?c. NGAY L?P T?C ?I KHM N?U:  Qu v? b? ?au ??u n?ng ho?c l l?n.  Qu v? b? y?u b?t th??ng, t b, ho?c c?m th?y nh? ng?t x?u.  Qu v? b? ?au ng?c ho?c ?au b?ng r?t nhi?u.  Qu v? nn nhi?u l?n.  Qu v? b? kh th?. ??M B?O QU V?:  Hi?u r cc h??ng d?n ny.  S? theo di tnh tr?ng c?a mnh.  S? yu c?u tr? gip ngay l?p t?c n?u qu v? c?m th?y khng kh?e ho?c th?y tr?m tr?ng h?n. Thng tin ny khng nh?m m?c ?ch thay th? cho l?i khuyn m chuyn gia ch?m [place] s?c kh?e ni v?i qu v?. Hy b?o ??m qu v? ph?i th?o lu?n b?t k? v?n ?? g m qu v? c v?i chuyn gia ch?m [place] s?c kh?e c?a qu v?. Document Released: 12/29/2004 Document Revised: 09/19/2014 Document Reviewed: 10/21/2012 Elsevier Interactive Patient Education  2017 Elsevier Inc. B?nh ti?u ???ng v th?c ph?m (Diabetes Mellitus and Food) ?i?u quan tro?ng la? quy? vi? pha?i ki?m sot l??ng ???ng (glucose) trong ma?u. L???ng ???ng trong mu c?a quy? vi? c th? b? ?nh h??ng r?t nhi?u b?i nh?ng g quy? vi? ?n. Vi?c ?n u?ng ca?c th?c ph?m la?nh ma?nh v??i s? l???ng thch h?p trong ngy va?o cng th?i gian m?i ngy s? gip quy? vi? ki?m sot l???ng glucose trong mu c?a quy? vi?. N c?ng c th? gip lm ch?m ho?c ng?n khng cho b?nh ti?u ???ng  n??ng thm. ?n u?ng lnh m?nh th?m ch c th? gip quy? vi? c?i thi?n huy?t p c?a quy? vi? v ??t ???c ho?c duy tr m?t m??c cn n??ng kh?e m?nh. Cc khuy?n ngh? chung cho ?n u?ng v thi quen n?u ?n lnh m?nh bao g?m:  ?n cc b?a ?n v ?? ?n nh? th??ng xuyn. Trnh nhi?n ?n trong th?i gian di ?? gi?m cn.  ?n m?t ch? ?? ?n bao g?m ch? y?u l th??c ph?m co? ngu?n g?c th?c v?t, ch?ng h?n nh? tri cy, rau, h?t, rau ??u v ng? c?c nguyn ha?t.  S? d?ng ca?c ph??ng php n?u ?n nhi?t ?? th?p,  ch?ng h?n nh? n??ng, thay v ph??ng php n?u nhi?t ?? cao nh? chin ng?p d?u m??. Lm vi?c v?i cc chuyn gia dinh d??ng c?a quy? vi? ?? ba?o ?a?m quy? vi? hi?u ca?ch s? d?ng thng tin dinh d??ng trn nhn th?c ph?m. TH?C PH?M CO? TH? ?NH H??NG ??N TI NH? TH? NA?O? Carbohydrates  Carbohydrate ?nh h??ng ??n m?c ?? ???ng trong mu c?a quy? vi? nhi?u h?n b?t k? lo?i th?c ph?m no khc. Chuyn gia dinh d??ng c?a quy? vi? s? gip quy? vi? xc ??nh co? th? ?n bao nhiu carbohydrates m?i b?a ?n v h???ng d?n quy? vi? ca?ch ti?nh l???ng carbohydrates. Ti?nh l???ng carbohydrates l vi?c quan tr?ng ?? gi? cho l???ng ???ng trong ma?u cu?a quy? vi? ? m?c phu? h??p, ??c bi?t n?u quy? vi? ?ang du?ng insulin ho?c m?t s? thu?c tri? ti?u ???ng nh?t ?i?nh. R??u  R??u c th? gy gi?m ??t ng?t l???ng ???ng trong mu (h? ???ng huy?t), ??c bi?t n?u quy? vi? du?ng insulin ho?c m?t s? lo?i thu?c tri? ti?u ???ng nh?t ?i?nh. H? ???ng huy?t c th? l m?t tnh tr?ng ?e d?a ma?ng s?ng. Cc tri?u ch?ng c?a h? ???ng huy?t (bu?n ng?, chng m?t v m?t ph??ng h??ng) l t??ng t? nh? cc tri?u ch?ng c?a vi?c u?ng qu nhi?u r??u. N?u chuyn gia ch?m [place] s?c kh?e c?a quy? vi? ch?p thu?n cho quy? vi? u?ng r??u, ha?y u?ng v??a pha?i va? la?m theo cc h???ng d?n sau:  Ph? n? khng nn u?ng nhi?u h?n m?t ly m?i ngy, v ?n ng khng nn u?ng nhi?u h?n hai ly m?i ngy. M?t ly l t??ng ???ng v?i:  12 oz bia.  5 oz r??u vang.  1 oz r??u n??ng.  Khng u?ng r???u khi ?o?i.  Gi? cho c? th? ?u? n???c. U?ng n??c, soda cho ng???i ?n [place], ho?c tr ?a? khng thm ???ng.  Neita CarpSoda thng th???ng, n??c tri cy v ca?c ?? u?ng h?n h??p khc c th? ch?a nhi?u carbohydrate v c?n ???c tnh. NH?NG TH?C ?N NO KHNG ???C KHUY?N NGH?? Khi quy? vi? l?a ch?n th?c ph?m, ?i?u quan tr?ng l ph?i nh? r?ng t?t c? cc lo?i th?c ph?m khng gi?ng nhau. M?t s? lo?i th?c ph?m c i?t ch?t dinh d??ng trong m?t kh?u ph?n h?n so v??i cc  th?c ph?m khc, m?c d chu?ng c th? c cng m?t s? l???ng calo ho?c carbohydrate. Kho? c th? ?? c? th? quy? vi? co? ?u? nh?ng ch?t c?n thi?t khi quy? vi? ?n cc th?c ph?m t ch?t dinh d??ng. V d? v? cc lo?i th?c ph?m m quy? vi? nn trnh ma? co? nhi?u calo v carbohydrate nh?ng t ch?t dinh d??ng bao g?m:  Ch?t be?o ba?o ho?a (h?u h?t ca?c th?c ph?m ch? bi?n ??u li?t k ch?t be?o ba?o ho?a trn nhn thng tin dinh d??ng).  Soda thng  th???ng.  N??c p.  K?o.  ?? ng?t, ch?ng h?n nh? bnh, bnh n???ng, bnh rn v ba?nh quy.  Th?c ph?m chin. TI C TH? ?N TH?C ?N G? ?n th?c ph?m giu ch?t dinh d??ng, m s? nui d??ng c? th? quy? vi? v gi? cho quy? vi? kh?e m?nh. Th?c ph?m quy? vi? nn ?n c?ng s? ph? thu?c vo m?t s? y?u t?, bao g?m:  L??ng calo quy? vi? c?n.  Cc lo?i thu?c m quy? vi? du?ng.  Cn n??ng cu?a quy? vi?.  M?c ?? ???ng trong mu c?a quy? vi?.  Huy?t p c?a quy? vi?.  M?c ?? cholesterol c?a quy? vi?. Quy? vi? nn ?n nhi?u lo?i th?c ph?m kha?c nhau, bao g?m:  Protein.  Thi?t na?c.  Protein co? i?t ch?t bo bo ha, nh? c, lng tr?ng tr?ng v ??u. Trnh cc lo?i th?t ch? bi?n s??n.  Tri cy v rau.  Tri cy v rau qu? c th? gip ki?m sot m?c ?? ???ng trong mu, nh? to, xoi v [place] m??.  Ca?c sa?n ph?m s??a.  Ch?n cc s?n ph?m s??a khng co? ho??c co? i?t ch?t be?o, ch?ng h?n nh? s?a, s??a chua va? pho mt.  Cc lo?i ng? c?c, bnh m, m ?ng v g?o.  Ch?n s?n ph?m nguyn h?t, ch?ng h?n nh? bnh m la?m t?? nhi?u loa?i ha?t, y?n m?ch nguyn ha?t v g?o l??t. Nh?ng th?c ph?m na?y c th? gip ki?m sot huy?t p.  Ch?t bo.  Th?c ph?m ch?a ch?t bo lnh m?nh, ch?ng h?n nh? cc lo?i h?t, l t?u, d?u  liu, d?u [place], v c. CO? PHA?I M?I NG??I BI? B?NH TI?U ???NG ??U C K? HO?CH ?N U?NG NH? NHAU KHNG? B?i v m?i ng??i bi? b?nh ti?u ???ng l khc nhau, nn khng m?t k? ho?ch ?n u?ng ph h?p cho t?t c? m?i ng??i. ?i?u r?t  quan tr?ng la? quy? vi? pha?i g??p chuyn gia dinh d??ng, chuyn gia ? s? gip quy? vi? xy d??ng m?t k? ho?ch ?n u?ng ch? phu? h??p cho quy? vi?. Thng tin ny khng nh?m m?c ?ch thay th? cho l?i khuyn m chuyn gia ch?m [place] s?c kh?e ni v?i qu v?. Hy b?o ??m qu v? ph?i th?o lu?n b?t k? v?n ?? g m qu v? c v?i chuyn gia ch?m [place] s?c kh?e c?a qu v?. Document Released: 04/22/2015 Document Revised: 04/22/2015 Document Reviewed: 11/25/2012 Elsevier Interactive Patient Education  2017 [employer].

## 2015-11-28 NOTE — Progress Notes (Signed)
Aimee Parker, is a 65 y.o. female  IHK:[id number]  [id number]  DOB - 09-12-1950  Chief Complaint  Patient presents with  . Follow-up      Subjective:   Aimee Parker is a 65 y.o. female with history of hypertension, type 2 diabetes mellitus requiring insulin and hypertriglyceridemia here today for a follow up visit and medication refills. Patient is doing well but patient's BP remains uncontrolled. Patient claims she is now adherent with her medications and even diet. She has no complaint today. She reports no side effect from her medications. Patient has No headache, No chest pain, No abdominal pain - No Nausea, No new weakness tingling or numbness, No Cough - SOB.  No problems updated.  ALLERGIES: No Known Allergies  PAST MEDICAL HISTORY: Past Medical History:  Diagnosis Date  . Hypertension    MEDICATIONS AT HOME: Prior to Admission medications   Medication Sig Start Date End Date Taking? Authorizing Provider  Blood Glucose Monitoring Suppl (TRUE METRIX METER) W/DEVICE KIT 1 each by Does not apply route 3 (three) times daily. 11/15/14  Yes Tresa Garter, MD  fenofibrate 160 MG tablet Take 1 tablet (160 mg total) by mouth daily. 11/28/15  Yes Tresa Garter, MD  glucose blood (TRUE METRIX BLOOD GLUCOSE TEST) test strip Use as instructed 08/29/15  Yes Cortny Bambach E Doreene Burke, MD  hydrochlorothiazide (HYDRODIURIL) 25 MG tablet Take 1 tablet (25 mg total) by mouth daily. 11/28/15  Yes Tresa Garter, MD  insulin NPH-regular Human (NOVOLIN 70/30) (70-30) 100 UNIT/ML injection Inject 20 Units into the skin 2 (two) times daily with a meal. 11/28/15  Yes Tresa Garter, MD  Insulin Syringe-Needle U-100 (BD INSULIN SYRINGE ULTRAFINE) 31G X 15/64" 0.3 ML MISC USE TO INJECT INSULIN AS DIRECTED BY PHYSICIAN 08/29/15  Yes Tresa Garter, MD  lisinopril (PRINIVIL,ZESTRIL) 40 MG tablet Take 1 tablet (40 mg total) by mouth daily. 11/28/15  Yes Tresa Garter, MD    metFORMIN (GLUCOPHAGE) 1000 MG tablet Take 1 tablet (1,000 mg total) by mouth 2 (two) times daily with a meal. 11/28/15  Yes Tresa Garter, MD  TRUEPLUS LANCETS 28G MISC Test Blood Sugar 3 times daily 02/14/15  Yes Tresa Garter, MD  Vitamin D, Ergocalciferol, (DRISDOL) 50000 UNITS CAPS capsule Take 1 capsule (50,000 Units total) by mouth every 7 (seven) days. 04/04/13  Yes Tresa Garter, MD  amLODipine (NORVASC) 10 MG tablet Take 1 tablet (10 mg total) by mouth daily. 11/28/15   Tresa Garter, MD  meclizine (ANTIVERT) 25 MG tablet Take 1 tablet (25 mg total) by mouth 3 (three) times daily as needed for dizziness. Patient not taking: Reported on 11/28/2015 11/30/12   [name], NP   Objective:   Vitals:   11/28/15 1142  BP: (!) 186/41  Pulse: 60  Resp: 18  Temp: 98.3 F (36.8 C)  TempSrc: Oral  SpO2: 98%  Weight: 129 lb (58.5 kg)  Height: _0  (1.549 m)   Exam General appearance : Awake, alert, not in any distress. Speech Clear. Not toxic looking HEENT: Atraumatic and Normocephalic, pupils equally reactive to light and accomodation Neck: Supple, no JVD. No cervical lymphadenopathy.  Chest: Good air entry bilaterally, no added sounds  CVS: S1 S2 regular, no murmurs.  Abdomen: Bowel sounds present, Non tender and not distended with no gaurding, rigidity or rebound. Extremities: B/L Lower Ext shows no edema, both legs are warm to touch Neurology: Awake alert, and oriented X 3, CN II-XII  intact, Non focal Skin: No Rash  Data Review Lab Results  Component Value Date   HGBA1C 7.8 11/28/2015   HGBA1C 8.2 08/29/2015   HGBA1C 9.6 (H) 05/23/2015    Assessment & Plan   1. Type 2 diabetes mellitus without complication, with long-term current use of insulin (HCC)  - POCT A1C is improved to 7.8% today - Microalbumin/Creatinine Ratio, Urine - Glucose (CBG)  - metFORMIN (GLUCOPHAGE) 1000 MG tablet; Take 1 tablet (1,000 mg total) by mouth 2 (two) times daily  with a meal.  Dispense: 180 tablet; Refill: 3 - insulin NPH-regular Human (NOVOLIN 70/30) (70-30) 100 UNIT/ML injection; Inject 20 Units into the skin 2 (two) times daily with a meal.  Dispense: 30 mL; Refill: 3  - COMPLETE METABOLIC PANEL WITH GFR  2. Essential hypertension: Uncontrolled  - lisinopril (PRINIVIL,ZESTRIL) 40 MG tablet; Take 1 tablet (40 mg total) by mouth daily.  Dispense: 90 tablet; Refill: 3 - hydrochlorothiazide (HYDRODIURIL) 25 MG tablet; Take 1 tablet (25 mg total) by mouth daily.  Dispense: 90 tablet; Refill: 3 Add - amLODipine (NORVASC) 10 MG tablet; Take 1 tablet (10 mg total) by mouth daily.  Dispense: 90 tablet; Refill: 3  - COMPLETE METABOLIC PANEL WITH GFR  We have discussed target BP range and blood pressure goal. I have advised patient to check BP regularly and to call us back or report to clinic if the numbers are consistently higher than 140/90. We discussed the importance of compliance with medical therapy and DASH diet recommended, consequences of uncontrolled hypertension discussed.  - continue current BP medications  3. Hypertriglyceridemia  - fenofibrate 160 MG tablet; Take 1 tablet (160 mg total) by mouth daily.  Dispense: 90 tablet; Refill: 3 - Lipid panel  Patient have been counseled extensively about nutrition and exercise. Other issues discussed during this visit include: low cholesterol diet, weight control and daily exercise, foot care, annual eye examinations at Ophthalmology, importance of adherence with medications and regular follow-up. We also discussed long term complications of uncontrolled diabetes and hypertension.   Return in about 3 months (around 02/28/2016) for Hemoglobin A1C and Follow up, DM, Follow up HTN.   Interpreter was used to communicate directly with patient for the entire encounter including providing detailed patient instructions.   The patient was given clear instructions to go to ER or return to medical center if  symptoms don't improve, worsen or new problems develop. The patient verbalized understanding. The patient was told to call to get lab results if they haven't heard anything in the next week.   This note has been created with Surveyor, quantity. Any transcriptional errors are unintentional.    Angelica Chessman, MD, [place], [place], [place], [place] and [place], [place]   11/28/2015, 12:21 PM

## 2015-11-28 NOTE — Progress Notes (Signed)
Patient is here for FU  Patient complains of intermittent HA's. Patient complains of uncontrolled HTN.  Patient has taken medication today. Patient has eaten today.  Patient request 3 month refills on medications to avoid multiple trips to pharmacy.

## 2015-11-29 ENCOUNTER — Telehealth: Payer: Self-pay | Admitting: *Deleted

## 2015-11-29 LAB — MICROALBUMIN / CREATININE URINE RATIO
CREATININE, URINE: 41 mg/dL (ref 20–320)
MICROALB/CREAT RATIO: 232 ug/mg{creat} — AB (ref <30)
Microalb, Ur: 9.5 mg/dL

## 2015-11-29 LAB — GLUCOSE, POCT (MANUAL RESULT ENTRY): POC Glucose: 132 mg/dl — AB (ref 70–99)

## 2015-11-29 NOTE — Telephone Encounter (Signed)
MA unable to leave a VM.  !!!Please inform patient of labs being normal and to drink water all throughout the day!!!

## 2015-11-29 NOTE — Telephone Encounter (Signed)
-----   Message from Quentin Angstlugbemiga E Jegede, MD sent at 11/29/2015 10:08 AM EST ----- Please inform patient that her lab results are normal. Encourage liberal water intake.

## 2015-12-19 ENCOUNTER — Ambulatory Visit: Payer: Medicaid Other | Attending: Internal Medicine | Admitting: Physician Assistant

## 2015-12-19 VITALS — BP 158/77 | HR 67 | Temp 98.1°F | Resp 16 | Wt 124.8 lb

## 2015-12-19 DIAGNOSIS — M5442 Lumbago with sciatica, left side: Secondary | ICD-10-CM | POA: Insufficient documentation

## 2015-12-19 DIAGNOSIS — M5440 Lumbago with sciatica, unspecified side: Secondary | ICD-10-CM

## 2015-12-19 DIAGNOSIS — Z794 Long term (current) use of insulin: Secondary | ICD-10-CM | POA: Insufficient documentation

## 2015-12-19 DIAGNOSIS — Z79899 Other long term (current) drug therapy: Secondary | ICD-10-CM | POA: Insufficient documentation

## 2015-12-19 DIAGNOSIS — E1110 Type 2 diabetes mellitus with ketoacidosis without coma: Secondary | ICD-10-CM | POA: Insufficient documentation

## 2015-12-19 DIAGNOSIS — E1310 Other specified diabetes mellitus with ketoacidosis without coma: Secondary | ICD-10-CM

## 2015-12-19 DIAGNOSIS — I10 Essential (primary) hypertension: Secondary | ICD-10-CM

## 2015-12-19 LAB — GLUCOSE, POCT (MANUAL RESULT ENTRY): POC GLUCOSE: 110 mg/dL — AB (ref 70–99)

## 2015-12-19 MED ORDER — VALACYCLOVIR HCL 500 MG PO TABS: 500.0000 mg | ORAL_TABLET | Freq: Two times a day (BID) | ORAL | 0 refills | Status: DC

## 2015-12-19 MED ORDER — METHOCARBAMOL 500 MG PO TABS: 500.0000 mg | ORAL_TABLET | Freq: Four times a day (QID) | ORAL | 0 refills | Status: DC

## 2015-12-19 MED ORDER — NAPROXEN 500 MG PO TABS: 500.0000 mg | ORAL_TABLET | Freq: Two times a day (BID) | ORAL | 0 refills | Status: DC

## 2015-12-19 MED FILL — ?VALACYCLOVIR HCL 500MG TAB: 500 | 7 days supply | Qty: 14 | Fill #0

## 2015-12-19 MED FILL — METHOCARBAMOL 500 MG TABLET: 500 | 23 days supply | Qty: 90 | Fill #0

## 2015-12-19 MED FILL — NAPROXEN 500 MG TABLET: 500 | 30 days supply | Qty: 60 | Fill #0

## 2015-12-19 NOTE — Patient Instructions (Signed)
B?nh zona (Shingles) B?nh zona, cn ???c g?i l gi??i leo, l m?t b?nh nhi?m trng gy pht ban ?au ???n trn da v m?n gi?p ch?a ??y d?ch. B?nh zona khng lin quan ??n herpes sinh d?c, l m?t b?nh nhi?m trng ly truy?n qua ???ng tnh d?c. B?nh zona ch? pht tri?n ? nh?ng ng??i:  ?a? bi? thu?y ??u.  ? ???c tim v??c xin th?y ??u. (Hi?m khi x?y ra). NGUYN NHN B?nh zona do vi ru?t varicella-zoster (VZV) gy ra. ?y l cu?ng la? lo?i vi rt gy b?nh th?y ??u. Sau khi b? nhi?m VZV, vi ru?t na?y ?? la?i trong c? th? trong tra?ng tha?i im li?m (khng ho?t ??ng). B?nh zona pht tri?n n?u vi ru?t hoa?t ??ng tr?? la?i. ?i?u ny c th? x?y ra nhi?u n?m sau khi ti?p xc v?i VZV l?n ??u. Ch?a ro? nguyn nhn la?m cho vi ru?t hoa?t ??ng tr?? la?i. CC Y?U T? NGUY C? Nh?ng ng??i ? b? b?nh th?y ??u ho?c ?a? ???c tim pho?ng th?y ??u c nguy c? b? b?nh zona. Nhi?m trng l ph? bi?n h?n ? nh?ng ng??i:  Trn 50 tu?i.  Co? h? th?ng pho?ng v? (mi?n d?ch) y?u, ch??ng ha?n nh??ng ng???i co? HIV, AIDS ho??c ung th?.  ?ang dng cc lo?i thu?c lm suy y?u h? mi?n d?ch, ch?ng h?n nh? thu?c c?y ghp.  Bi? c?ng th??ng r?t nhi?u. TRI?U CH?NG Cc tri?u ch?ng s?m c?a b?nh ny bao g?m ng?a, ng?a ran v ?au ? m?t khu v?c trn da c?a quy? vi?. ?au c th? ???c m t? nh? l??a ??t, nh? bi? ?m, ho?c ?au nhi. M?t vi ngy ho?c vi tu?n sau khi cc tri?u ch?ng b?t ??u, pht ban ?? ?au ???n xu?t hi?n, th??ng ? m?t bn c?a c? th? v??i hi?nh da?ng nh? da?i b?ng ho??c dy th??c l?ng. Pht ban cu?i cng bi?n thnh m?n gi?p ch?a di?ch v? ra, ?ng v?y, v kh trong kho?ng 2-3 tu?n. T?i b?t k? th?i gian na?o trong lu?c nhi?m tru?ng, quy? vi? co? th? bi?:  S?t.  ?n l?nh.  ?au ??u.  Bu?ng kho? chi?u. CH?N ?ON B?nh ny c th? ???c ch?n ?on b?ng cch khm da. ?i khi, m?u da ho?c ch?t di?ch co? th? ????c l?y t? cc m?n gi?p tr??c khi ch?n ?on. Nh?ng m?u na?y ???c ki?m tra d??i knh hi?n vi ho?c g?i ??n  m?t phng th nghi?m ?? xe?t nghi?m. ?I?U TR? Khng c cch ch?a c? th? cho b?nh ny. Chuyn gia ch?m [place] s?c kh?e c?a quy? vi? c th? s? k ??n thu?c ?? gip quy? vi? gia?m ?au, ph?c h?i nhanh chng h?n, v trnh cc v?n ?? di h?n. Thu?c c th? bao g?m:  Thu?c kha?ng vi ru?t.  Thu?c ch?ng vim.  Thu?c gi?m ?au. N?u khu v?c bi? a?nh h???ng l trn m?t quy? vi?, quy? vi? c th? ???c gi?i thi?u ??n m?t chuyn gia, ch?ng h?n nh? m?t bc s? v? m??t (ba?c si? nha?n khoa) ho?c m?t bc s? chuyn khoa tai, m?i, h?ng (ENT) ?? gip quy? vi? trnh cc b?nh v? m?t, ?au ma?n tnh, ho?c khuy?t t?t. H??NG D?N CH?M [place] T?I NH Thu?c   Ch? s? d?ng thu?c theo ch? d?n c?a chuyn gia ch?m [place] s?c kh?e.  Bi kem ch?ng ng?a ho?c kem thu?c t vo vng b? ?nh h??ng theo h??ng d?n c?a chuyn gia ch?m [place] s?c kh?e c?a quy? vi?. Ch?m so?c mu?n gi?p va? pha?t ban   Hy t?m n???c mt ho?c ch???m  la?nh nh?ng ch? pha?t ban ho?c m?n gi?p theo ch? d?n c?a chuyn gia ch?m [place] s?c kh?e c?a quy? vi?. ?i?u ny c th? gip gi?m ?au v ng?a.  Che ch? pha?t ban la?i b??ng m?t b?ng lo?ng (b?ng). M??c qu?n a?o r?ng ?? giu?p gia?m ?au do va?i qu?n a?o co? va?o ch? pha?t ban.  Gi?? cho ch? pht ban v m?n n??c c?a quy? vi? s?ch s? b?ng x pho?ng nh? v n??c mt ho?c theo ch? d?n c?a chuyn gia ch?m [place] s?c kh?e c?a quy? vi?.  Ki?m tra ch? pha?t ban m?i ngy xem c d?u hi?u nhi?m trng hay khng. Nh??ng d?u hi?u na?y bao g?m ??, s?ng, ?au ko di ho?c t?ng ln.  Khng cho?c va?o mu?n gi?p.  Khng gi ch? pht ban. H??ng d?n chung   Nghi? ng?i theo ch? d?n c?a chuyn gia ch?m [place] s?c kh?e.  Tun th? t?t c? cc cu?c h?n khm l?i theo ch? d?n c?a chuyn gia ch?m [place] s?c kh?e. ?i?u ny c vai tr quan tr?ng.  Cho ??n khi m?n gi?p cu?a quy? vi? ?o?ng va?y, b?nh nhi?m trng cu?a quy? vi? c th? gy b?nh th?y ??u ? nh?ng ng??i ch?a bao gi? bi? ho?c ?a? ????c tim v??c xin pho?ng b?nh. ?? ng?n ch?n ?i?u ny x?y  ra, trnh ti?p xc v?i nh?ng ng??i khc, ??c bi?t l:  Tre? em.  Ph? n? c [place].  Tr? em b? b?nh eczema.  Ng??i cao tu?i c c? [place] c?y ghp.  Nh?ng ng??i c b?nh ma?n tnh, nh? b?nh b?ch c?u ho?c AIDS. ?I [facility] N?U:  Qu v? khng gia?m ?au sau khi dng thu?c.  C?n ?au cu?a quy? vi? khng ??? h?n sau khi lnh pht ban.  Ch? pht ban c?a quy? vi? trng nh? bi? nhi?m tru?ng. Nh??ng d?u hi?u nhi?m tru?ng bao g?m t?y ??, s?ng, ?au ko di ho?c t?ng ln. NGAY L?P T?C ?I [facility] N?U:  Pht ban l trn m?t ho?c m?i cu?a quy? vi?.  Quy? vi? bi? ?au ?? m?t, ?au xung quanh vng m?t, ho?c m?t c?m gic ? m?t bn m?t.  Quy? vi? bi? ?au tai ho?c quy? vi? bi? u? tai.  Quy? vi? m?t vi? gia?c.  Tnh tr?ng c?a qu v? t?i t? h?n. Thng tin ny khng nh?m m?c ?ch thay th? cho l?i khuyn m chuyn gia ch?m [place] s?c kh?e ni v?i qu v?. Hy b?o ??m qu v? ph?i th?o lu?n b?t k? v?n ?? g m qu v? c v?i chuyn gia ch?m [place] s?c kh?e c?a qu v?. Document Released: 04/22/2015 Document Revised: 04/22/2015 Document Reviewed: 11/09/2013 Elsevier Interactive Patient Education  2017 [employer].

## 2015-12-19 NOTE — Progress Notes (Signed)
Pt is in the office today for back pain  

## 2015-12-19 NOTE — Progress Notes (Signed)
Aimee Parker, is a 65 y.o. female  PNT:[id number]  GQQ:[id number]  DOB - 1950/04/27  Subjective:  Chief Complaint and HPI: Aimee Parker is a 65 y.o. female here today c/o 4 day history of L sided LBP that is radiating into the L thigh.  She is having mild numbness and paresthesias in her L thigh.  She feels tired but not otherwise sick.  Her blood sugars have been good.  She denies N/V/D.  She first noticed the pain when she was squatting to do some cleaning.  She was close down to the floor and fell back from about 1 foot off the floor onto her L buttocks.  She said that it wasn't painful but she noticed she was already sore in that area.  Husband is translating for patient.  Reviewed most recent OV with Dr Doreene Burke and labs reviewed.     ROS:   Constitutional:  No f/c, No night sweats, No unexplained weight loss. EENT:  No vision changes, No blurry vision, No hearing changes. No mouth, throat, or ear problems.  Respiratory: No cough, No SOB Cardiac: No CP, no palpitations GI:  No abd pain, No N/V/D. GU: No Urinary s/sx Musculoskeletal: +LBP Neuro: No headache, no dizziness, no motor weakness.  Skin: No rash Endocrine:  No polydipsia. No polyuria.  Psych: Denies SI/HI  Problem  DM (diabetes mellitus) type 2, uncontrolled, with ketoacidosis ([place])    ALLERGIES: No Known Allergies  PAST MEDICAL HISTORY: Past Medical History:  Diagnosis Date  . Hypertension     MEDICATIONS AT HOME: Prior to Admission medications   Medication Sig Start Date End Date Taking? Authorizing Provider  amLODipine (NORVASC) 10 MG tablet Take 1 tablet (10 mg total) by mouth daily. 11/28/15   Tresa Garter, MD  Blood Glucose Monitoring Suppl (TRUE METRIX METER) W/DEVICE KIT 1 each by Does not apply route 3 (three) times daily. 11/15/14   Tresa Garter, MD  fenofibrate 160 MG tablet Take 1 tablet (160 mg total) by mouth daily. 11/28/15   Tresa Garter, MD  glucose blood (TRUE METRIX BLOOD  GLUCOSE TEST) test strip Use as instructed 08/29/15   Tresa Garter, MD  hydrochlorothiazide (HYDRODIURIL) 25 MG tablet Take 1 tablet (25 mg total) by mouth daily. 11/28/15   Tresa Garter, MD  insulin NPH-regular Human (NOVOLIN 70/30) (70-30) 100 UNIT/ML injection Inject 20 Units into the skin 2 (two) times daily with a meal. 11/28/15   Tresa Garter, MD  Insulin Syringe-Needle U-100 (BD INSULIN SYRINGE ULTRAFINE) 31G X 15/64" 0.3 ML MISC USE TO INJECT INSULIN AS DIRECTED BY PHYSICIAN 08/29/15   Tresa Garter, MD  lisinopril (PRINIVIL,ZESTRIL) 40 MG tablet Take 1 tablet (40 mg total) by mouth daily. 11/28/15   Tresa Garter, MD  meclizine (ANTIVERT) 25 MG tablet Take 1 tablet (25 mg total) by mouth 3 (three) times daily as needed for dizziness. Patient not taking: Reported on 12/19/2015 11/30/12   [name], NP  metFORMIN (GLUCOPHAGE) 1000 MG tablet Take 1 tablet (1,000 mg total) by mouth 2 (two) times daily with a meal. 11/28/15   Tresa Garter, MD  methocarbamol (ROBAXIN) 500 MG tablet Take 1 tablet (500 mg total) by mouth 4 (four) times daily. X 1 week then prn muscle pain 12/19/15   Argentina Donovan, PA-C  naproxen (NAPROSYN) 500 MG tablet Take 1 tablet (500 mg total) by mouth 2 (two) times daily with a meal. X 1 week then prn pain 12/19/15  Argentina Donovan, PA-C  TRUEPLUS LANCETS 28G MISC Test Blood Sugar 3 times daily 02/14/15   Tresa Garter, MD  valACYclovir (VALTREX) 500 MG tablet Take 1 tablet (500 mg total) by mouth 2 (two) times daily. 12/19/15   Argentina Donovan, PA-C  Vitamin D, Ergocalciferol, (DRISDOL) 50000 UNITS CAPS capsule Take 1 capsule (50,000 Units total) by mouth every 7 (seven) days. Patient not taking: Reported on 12/19/2015 04/04/13   Tresa Garter, MD     Objective:  EXAM:   Vitals:   12/19/15 0944  BP: (!) 158/77  Pulse: 67  Resp: 16  Temp: 98.1 F (36.7 C)  TempSrc: Oral  SpO2: 98%  Weight: 124 lb 12.8 oz (56.6 kg)      General appearance : A&OX3. NAD. Non-toxic-appearing HEENT: Atraumatic and Normocephalic.  PERRLA. EOM intact.  TM clear B. Mouth-MMM, post pharynx WNL w/o erythema, No PND. Neck: supple, no JVD. No cervical lymphadenopathy. No thyromegaly Chest/Lungs:  Breathing-non-labored, Good air entry bilaterally, breath sounds normal without rales, rhonchi, or wheezing  CVS: S1 S2 regular, no murmurs, gallops, rubs  Back: TTP over L buttocks and lower back with some spasm present Extremities: Bilateral Lower Ext shows no edema, both legs are warm to touch with = pulse throughout.  DTR=B Neurology:  CN II-XII grossly intact, Non focal.   Psych:  TP linear. J/I WNL. Normal speech. Appropriate eye contact and affect.  Skin:  No Rash currently.  +Hyperesthesias over the L buttocks and thigh posteriorly and anteriorly  Data Review Lab Results  Component Value Date   HGBA1C 7.8 11/28/2015   HGBA1C 8.2 08/29/2015   HGBA1C 9.6 (H) 05/23/2015     Assessment & Plan   1. Essential hypertension, benign Suboptimal control, bu the patient is also in pain today We have discussed target BP range and blood pressure goal. I have advised patient to check BP regularly and to call us back or report to clinic if the numbers are consistently higher than 140/90. We discussed the importance of compliance with medical therapy and DASH diet recommended, consequences of uncontrolled hypertension discussed.  Continue current regimen.  2. Uncontrolled type 2 diabetes mellitus with ketoacidosis without coma, without long-term current use of insulin (HCC) Improving control - POCT glucose (manual entry)=110 this morning.  Last A1C=7.8 in 11/2015 I have had a lengthy discussion and provided education about insulin resistance and the intake of too much sugar/refined carbohydrates.  I have advised the patient to work at a goal of eliminating sugary drinks, candy, desserts, sweets, refined sugars, processed foods, and white  carbohydrates.  The patient expresses understanding.    3. Acute left-sided low back pain with sciatica, sciatica laterality unspecified With general malaise and fatigue along with hyperesthesias of skin and distribution of pain, this is suspicious for shingles prodrome.  Either way, regimen will also help if only musculoskeletal. - valACYclovir (VALTREX) 500 MG tablet; Take 1 tablet (500 mg total) by mouth 2 (two) times daily.  Dispense: 14 tablet; Refill: 0 - methocarbamol (ROBAXIN) 500 MG tablet; Take 1 tablet (500 mg total) by mouth 4 (four) times daily. X 1 week then prn muscle pain  Dispense: 90 tablet; Refill: 0 - naproxen (NAPROSYN) 500 MG tablet; Take 1 tablet (500 mg total) by mouth 2 (two) times daily with a meal. X 1 week then prn pain  Dispense: 60 tablet; Refill: 0  Patient have been counseled extensively about nutrition and exercise  Return in about 10 weeks (around 02/27/2016) for  with Dr Alinda Money; DM and htn f/up; sooner if needed.  The patient was given clear instructions to go to ER or return to medical center if symptoms don't improve, worsen or new problems develop. The patient verbalized understanding. The patient was told to call to get lab results if they haven't heard anything in the next week.     Freeman Caldron, PA-C [facility], [place]   12/19/2015, 9:58 AMPatient ID: Glenna Fellows, female   DOB: 07-23-1950, 65 y.o.   MRN: [id number]

## 2016-01-02 ENCOUNTER — Ambulatory Visit: Payer: Medicaid Other | Admitting: Internal Medicine

## 2016-01-28 MED FILL — FENOFIBRATE 160 MG TABLET: 160 | 90 days supply | Qty: 90 | Fill #0

## 2016-01-28 MED FILL — metFORMIN HCL 1000 MG TABS: 1000 | 90 days supply | Qty: 180 | Fill #0

## 2016-01-28 MED FILL — HYDROCHLOROTHIAZIDE 25 MG T: 25 | 90 days supply | Qty: 90 | Fill #0

## 2016-01-28 MED FILL — AMLODIPINE BESYLATE 10 MG T: 10 | 90 days supply | Qty: 90 | Fill #2

## 2016-02-26 ENCOUNTER — Encounter: Payer: Self-pay | Admitting: Internal Medicine

## 2016-02-26 ENCOUNTER — Ambulatory Visit: Payer: Medicaid Other | Attending: Internal Medicine | Admitting: Internal Medicine

## 2016-02-26 VITALS — BP 161/67 | HR 61 | Temp 98.2°F | Resp 18 | Ht 62.0 in | Wt 130.2 lb

## 2016-02-26 DIAGNOSIS — Z794 Long term (current) use of insulin: Secondary | ICD-10-CM | POA: Diagnosis not present

## 2016-02-26 DIAGNOSIS — E119 Type 2 diabetes mellitus without complications: Secondary | ICD-10-CM | POA: Diagnosis not present

## 2016-02-26 DIAGNOSIS — I10 Essential (primary) hypertension: Secondary | ICD-10-CM | POA: Diagnosis not present

## 2016-02-26 DIAGNOSIS — E781 Pure hyperglyceridemia: Secondary | ICD-10-CM | POA: Diagnosis not present

## 2016-02-26 LAB — POCT GLYCOSYLATED HEMOGLOBIN (HGB A1C): HEMOGLOBIN A1C: 7.9

## 2016-02-26 LAB — GLUCOSE, POCT (MANUAL RESULT ENTRY): POC Glucose: 113 mg/dl — AB (ref 70–99)

## 2016-02-26 MED ORDER — FENOFIBRATE 160 MG PO TABS: 160.0000 mg | ORAL_TABLET | Freq: Every day | ORAL | 3 refills | Status: DC

## 2016-02-26 MED ORDER — METFORMIN HCL 1000 MG PO TABS: 1000.0000 mg | ORAL_TABLET | Freq: Two times a day (BID) | ORAL | 3 refills | Status: DC

## 2016-02-26 MED ORDER — HYDROCHLOROTHIAZIDE 25 MG PO TABS: 25.0000 mg | ORAL_TABLET | Freq: Every day | ORAL | 3 refills | Status: DC

## 2016-02-26 MED ORDER — LISINOPRIL 40 MG PO TABS: 40.0000 mg | ORAL_TABLET | Freq: Every day | ORAL | 3 refills | Status: DC

## 2016-02-26 MED ORDER — INSULIN NPH ISOPHANE & REGULAR (70-30) 100 UNIT/ML ~~LOC~~ SUSP
20.0000 [IU] | Freq: Two times a day (BID) | SUBCUTANEOUS | 3 refills | Status: DC
Start: 2016-02-26 — End: 2016-09-09

## 2016-02-26 MED ORDER — AMLODIPINE BESYLATE 10 MG PO TABS: 10.0000 mg | ORAL_TABLET | Freq: Every day | ORAL | 3 refills | Status: DC

## 2016-02-26 MED FILL — NOVOLIN 70/30 100 UNITS/ML: (70-30) 100 | 25 days supply | Qty: 10 | Fill #0

## 2016-02-26 MED FILL — metFORMIN HCL 1000 MG TABS: 1000 | 30 days supply | Qty: 60 | Fill #0

## 2016-02-26 MED FILL — HYDROCHLOROTHIAZIDE 25 MG T: 25 | 30 days supply | Qty: 30 | Fill #0

## 2016-02-26 MED FILL — AMLODIPINE BESYLATE 10 MG T: 10 | 30 days supply | Qty: 30 | Fill #0

## 2016-02-26 MED FILL — LISINOPRIL 40 MG TABLET: 40 | 30 days supply | Qty: 30 | Fill #0

## 2016-02-26 NOTE — Patient Instructions (Signed)
B?nh ti?u ???ng v th?c ph?m (Diabetes Mellitus and Food) ?i?u quan tro?ng la? quy? vi? pha?i ki?m sot l??ng ???ng (glucose) trong ma?u. L???ng ???ng trong mu c?a quy? vi? c th? b? ?nh [place]??ng r?t nhi?u b?i nh?ng g quy? vi? ?n. Vi?c ?n u?ng ca?c th?c ph?m la?nh ma?nh v??i s? l???ng thch [place]?p trong ngy va?o cng th?i gian m?i ngy s? gip quy? vi? ki?m sot l???ng glucose trong mu c?a quy? vi?. N c?ng c th? gip lm ch?m ho?c ng?n khng cho b?nh ti?u ???ng n??ng thm. ?n u?ng lnh m?nh th?m ch c th? gip quy? vi? c?i thi?n huy?t p c?a quy? vi? v ??t ???c ho?c duy tr m?t m??c cn n??ng kh?e m?nh. Cc khuy?n ngh? chung cho ?n u?ng v thi quen n?u ?n lnh m?nh bao g?m:  ?n cc b?a ?n v ?? ?n nh? th??ng xuyn. Trnh nhi?n ?n trong th?i gian di ?? gi?m cn.  ?n m?t ch? ?? ?n bao g?m ch? y?u l th??c ph?m co? ngu?n g?c th?c v?t, ch?ng [place]?n nh? tri cy, rau, [place]?t, rau ??u v ng? c?c nguyn ha?t.  S? d?ng ca?c ph??ng php n?u ?n nhi?t ?? th?p, ch?ng [place]?n nh? n??ng, thay v ph??ng php n?u nhi?t ?? cao nh? chin ng?p d?u m??. Lm vi?c v?i cc chuyn gia dinh d??ng c?a quy? vi? ?? ba?o ?a?m quy? vi? hi?u ca?ch s? d?ng thng tin dinh d??ng trn nhn th?c ph?m. TH?C PH?M CO? TH? ?NH [place]??NG ??N TI NH? TH? NA?O? Carbohydrates  Carbohydrate ?nh [place]??ng ??n m?c ?? ???ng trong mu c?a quy? vi? nhi?u [place]?n b?t k? lo?i th?c ph?m no khc. Chuyn gia dinh d??ng c?a quy? vi? s? gip quy? vi? xc ??nh co? th? ?n bao nhiu carbohydrates m?i b?a ?n v [place]???ng d?n quy? vi? ca?ch ti?nh l???ng carbohydrates. Ti?nh l???ng carbohydrates l vi?c quan tr?ng ?? gi? cho l???ng ???ng trong ma?u cu?a quy? vi? ? m?c phu? [place]??p, ??c bi?t n?u quy? vi? ?ang du?ng insulin ho?c m?t s? thu?c tri? ti?u ???ng nh?t ?i?nh. R??u  R??u c th? gy gi?m ??t ng?t l???ng ???ng trong mu ([place]? ???ng huy?t), ??c bi?t n?u quy? vi? du?ng insulin ho?c m?t s? lo?i thu?c tri? ti?u ???ng nh?t ?i?nh. [place]? ???ng huy?t c th? l m?t tnh  tr?ng ?e d?a ma?ng s?ng. Cc tri?u ch?ng c?a [place]? ???ng huy?t (bu?n ng?, chng m?t v m?t ph??ng [place]??ng) l t??ng t? nh? cc tri?u ch?ng c?a vi?c u?ng qu nhi?u r??u. N?u chuyn gia ch?m [place] s?c kh?e c?a quy? vi? ch?p thu?n cho quy? vi? u?ng r??u, ha?y u?ng v??a pha?i va? la?m theo cc [place]???ng d?n sau:  Ph? n? khng nn u?ng nhi?u [place]?n m?t ly m?i ngy, v ?n ng khng nn u?ng nhi?u [place]?n hai ly m?i ngy. M?t ly l t??ng ???ng v?i:  12 oz bia.  5 oz r??u vang.  1 oz r??u n??ng.  Khng u?ng r???u khi ?o?i.  Gi? cho c? th? ?u? n???c. U?ng n??c, soda cho ng???i ?n king, ho?c tr ?a? khng thm ???ng.  [place] thng th???ng, n??c tri cy v ca?c ?? u?ng [place]li?n [place]??p khc c th? ch?a nhi?u carbohydrate v c?n ???c tnh. NH?NG TH?C ?N NO KHNG ???C KHUY?N NGH?? Khi quy? vi? l?a ch?n th?c ph?m, ?i?u quan tr?ng l ph?i nh? r?ng t?t c? cc lo?i th?c ph?m khng gi?ng nhau. M?t s? lo?i th?c ph?m c i?t ch?t dinh d??ng trong m?t kh?u ph?n [place]?n so v??i cc th?c ph?m khc,  m?c d chu?ng c th? c cng m?t s? l???ng calo ho?c carbohydrate. Kho? c th? ?? c? th? quy? vi? co? ?u? nh?ng ch?t c?n thi?t khi quy? vi? ?n cc th?c ph?m t ch?t dinh d??ng. V d? v? cc lo?i th?c ph?m m quy? vi? nn trnh ma? co? nhi?u calo v carbohydrate nh?ng t ch?t dinh d??ng bao g?m:  Ch?t be?o ba?o ho?a (h?u h?t ca?c th?c ph?m ch? bi?n ??u li?t k ch?t be?o ba?o ho?a trn nhn thng tin dinh d??ng).  Soda thng th???ng.  N??c p.  K?o.  ?? ng?t, ch?ng h?n nh? bnh, bnh n???ng, bnh rn v ba?nh quy.  Th?c ph?m chin. TI C TH? ?N TH?C ?N G? ?n th?c ph?m giu ch?t dinh d??ng, m s? nui d??ng c? th? quy? vi? v gi? cho quy? vi? kh?e m?nh. Th?c ph?m quy? vi? nn ?n c?ng s? ph? thu?c vo m?t s? y?u t?, bao g?m:  L??ng calo quy? vi? c?n.  Cc lo?i thu?c m quy? vi? du?ng.  Cn n??ng cu?a quy? vi?.  M?c ?? ???ng trong mu c?a quy? vi?.  Huy?t p c?a quy? vi?.  M?c ?? cholesterol c?a quy?  vi?. Quy? vi? nn ?n nhi?u lo?i th?c ph?m kha?c nhau, bao g?m:  Protein.  Thi?t na?c.  Protein co? i?t ch?t bo bo ha, nh? c, lng tr?ng tr?ng v ??u. Trnh cc lo?i th?t ch? bi?n s??n.  Tri cy v rau.  Tri cy v rau qu? c th? gip ki?m sot m?c ?? ???ng trong mu, nh? to, xoi v [place] m??.  Ca?c sa?n ph?m s??a.  Ch?n cc s?n ph?m s??a khng co? ho??c co? i?t ch?t be?o, ch?ng h?n nh? s?a, s??a chua va? pho mt.  Cc lo?i ng? c?c, bnh m, m ?ng v g?o.  Ch?n s?n ph?m nguyn h?t, ch?ng h?n nh? bnh m la?m t?? nhi?u loa?i ha?t, y?n m?ch nguyn ha?t v g?o l??t. Nh?ng th?c ph?m na?y c th? gip ki?m sot huy?t p.  Ch?t bo.  Th?c ph?m ch?a ch?t bo lnh m?nh, ch?ng h?n nh? cc lo?i h?t, l t?u, d?u  liu, d?u canola, v c. CO? PHA?I M?I NG??I BI? B?NH TI?U ???NG ??U C K? HO?CH ?N U?NG NH? NHAU KHNG? B?i v m?i ng??i bi? b?nh ti?u ???ng l khc nhau, nn khng m?t k? ho?ch ?n u?ng ph h?p cho t?t c? m?i ng??i. ?i?u r?t quan tr?ng la? quy? vi? pha?i g??p chuyn gia dinh d??ng, chuyn gia ? s? gip quy? vi? xy d??ng m?t k? ho?ch ?n u?ng ch? phu? h??p cho quy? vi?. Thng tin ny khng nh?m m?c ?ch thay th? cho l?i khuyn m chuyn gia ch?m [place] s?c kh?e ni v?i qu v?. Hy b?o ??m qu v? ph?i th?o lu?n b?t k? v?n ?? g m qu v? c v?i chuyn gia ch?m [place] s?c kh?e c?a qu v?. Document Released: 04/22/2015 Document Revised: 04/22/2015 Document Reviewed: 11/25/2012 Elsevier Interactive Patient Education  2017 Elsevier Inc. [place] ???ng huy?t, Ng??i l?n (Blood Glucose Monitoring, Adult) Theo di ???ng huy?t (cn ???c g?i l l??ng ???ng trong mu) s? gip qu v? qu?n l b?nh ti?u ???ng c?a mnh. N c?ng gip qu v? v chuyn gia ch?m [place] s?c kh?e c?a qu v? theo di b?nh ti?u ???ng v xc ??nh k? ho?ch ?i?u tr? c?a qu v? hi?u qu? ??n ?u. T?I SAO QU V? NN THEO DI ???NG HUY?T C?A MNH?   Vi?c theo di c th? gip qu v? hi?u r  th?c ?n, t?p th? d?c v thu?c  men ?nh h??ng nh? th? no ??n l??ng ???ng huy?t c?a qu v?.  N cho php qu v? bi?t l??ng ???ng huy?t t?i m?t th?i ?i?m nh?t ??nh. Qu v? c th? nhanh chng bi?t ???c n?u qu v? ?ang c ???ng huy?t th?p (h? ???ng huy?t) ho?c l??ng ???ng huy?t cao (t?ng ???ng huy?t).  N c th? gip qu v? v chuyn gia ch?m [place] s?c kh?e bi?t ?i?u ch?nh thu?c cho qu v? nh? th? no.  N c th? gip qu v? hi?u lm th? no ?? qu?n l b?nh ho?c ?i?u ch?nh thu?c ?? t?p luy?n. KHI NO QU V? C?N KI?M TRA? Chuyn gia ch?m [place] s?c kh?e s? gip qu v? quy?t ??nh qu v? c?n ki?m tra ???ng huy?t bao lu m?t l?n. ?i?u ny c th? ph? thu?c vo lo?i b?nh ti?u ???ng c?a qu v?, bi?n php ki?m sot b?nh ti?u ???ng, ho?c cc lo?i thu?c qu v? ?ang dng. Hy ch?c ch?n vi?t ra t?t c? cc k?t qu? ?o ???ng huy?t ?? thng tin ny c th? ???c ?nh gi cng v?i chuyn gia ch?m [place] s?c kh?e c?a qu v?. Xem cc v d? d??i ?y v? th?i gian ki?m tra m [place] Arab Republic[place]yn gia ch?m [place] s?c kh?e c th? ?? ngh?. Ti?u ???ng tup 1   Ki?m tra l??ng ???ng trong mu c?a quy? vi? t nh?t 2 l?n m?i ngy.  C?ng ki?m tra l??ng ???ng trong mu c?a quy? vi?:  Tr??c m?i l?n tim insulin.  Tr??c v sau khi t?p th? d?c.  Gi?a cc b?a ?n.  2 gi? sau b?a ?n.  Thi?nh thoa?ng, t? 2 gi?? sng ??n 3 gi?? sng.  Quy? vi? c th? c?n ki?m tra l??ng ???ng trong mu cu?a quy? vi? th??ng xuyn h?n:  N?u quy? vi? s? d?ng m?t my tim insulin.  N?u quy? vi? c?n tim nhi?u l?n m?i ngy.  N?u b?nh ti?u ???ng c?a quy? vi? khng ???c ki?m sot t?t.  N?u quy? vi? ?ang b? b?nh. Ti?u ???ng tup 2   N?u qu v? ?ang dng insulin, ki?m tra t nh?t 2 l?n m?i ngy. Tuy nhin, t?t nh?t l ph?i ki?m tra tr??c m?i l?n tim insulin.  N?u qu v? dng thu?c qua ???ng mi?ng (???ng u?ng), ki?m tra 2 l?n m?i ngy.  N?u qu v? ?ang c m?t ch? ?? ?n u?ng ki?m sot, ki?m tra m?t l?n m?i ngy.  N?u b?nh ti?u ???ng c?a qu v? khng ???c ki?m sot t?t ho?c n?u qu v? b? ?m, qu v? c  th? c?n ph?i theo di th??ng xuyn h?n. LM TH? NO ?? THEO DI ???NG HUY?T C?A QU V? V?t d?ng c?n thi?t   My ?o ???ng huy?t.  Que th? ?? ?o ???ng huy?t c?a qu v?. M?i my ?o c cc que th? ring. Qu v? ph?i s? d?ng cc que th? ?i km my ?o c?a qu v?.  M?t m?i kim chm (m?i trch).  M?t d?ng c? gi? m?i trch (d?ng c? trch).  M?t cu?n nh?t k ghi l?i k?t qu? c?a qu v?. Th? thu?t   R?a tay qu v? b?ng x phng v n??c. C?n khng ???c ?u tin.  Chm vo c?nh c?a ngn tay (khng ph?i ??u ngn tay) b?ng m?i trch.  N?n nh? nhng ngn tay cho t?i khi m?t gi?t mu nh? xu?t hi?n.  Lm theo cc h??ng d?n km theo my ?o c?a qu v? ?? chn que th?, bi mu  vo que th?, v dng my ?o ???ng huy?t c?a qu v?. Nh?ng khu v?c khc c th? l?y mu ?? xt nghi?m  M?t s? my ?o cho php qu v? s? d?ng cc khu v?c khc c?a c? th? (ngoi ngn tay c?a qu v?) ?? xt nghi?m mu. Nh?ng khu v?c ny ???c g?i l v? tr thay th?. Nh?ng v? tr thay th? ph? bi?n nh?t l:  C?ng tay.  ?i.  Khu v?c pha sau c?a c?ng chn pha d??i.  Lng bn tay. Dng mu ??n nh?ng khu v?c ny ch?m h?n. V v?y, cc k?t qu? ?o ???ng huy?t m qu v? c ???c c th? b? ch?m tr?, v cc con s? k?t qu? l khc v?i khi l?y mu t? ngn tay c?a qu v?. Khng s? d?ng cc v? tr thay th? n?u qu v? ngh? r?ng qu v? ?ang b? h? ???ng huy?t. K?t qu? c?a qu v? s? khng chnh xc. Lun lun s? d?ng m?t ngn tay n?u qu v? ?ang b? h? ???ng huy?t. Ngoi ra, n?u qu v? khng th? c?m th?y l??ng ???ng huy?t th?p (khng bi?t h? ???ng huy?t), hy lun lun s? d?ng ngn tay ?? ki?m tra ???ng huy?t c?a qu v?. L?I KHUYN B? SUNG ?? THEO DI ???NG HUY?T  Khng s? d?ng l?i m?i trch.  Lun lun mang theo cc v?t d?ng c?a qu v?.  T?t c? my ?o ???ng huy?t c m?t s? ?i?n tho?i "???ng dy nng" tr?c 24 gi? ?? g?i n?u qu v? c cu h?i ho?c c?n tr? gip.  ?i?u ch?nh (hi?u ch?nh) my ?o ???ng huy?t c?a qu v? b?ng m?t dung d?ch ki?m sot sau khi  dng h?t m?t vi h?p que th?. GI? S? THEO DI ???NG HUY?T Qu v? nn gi? s? ghi chp hng ngy ho?c nh?t k k?t qu? ???ng huy?t c?a qu v?. H?u h?t my ?o ???ng huy?t, n?u khng ph?i t?t c?, ??u l?u gi? k?t qu? ???ng huy?t trong my. M?t s? my c kh? n?ng t?i k?t qu? l?u tr? c?a qu v? v? my tnh ? nh qu v?. Vi?c gi? m?t s? theo di k?t qu? ???ng huy?t l ??c bi?t h?u ch n?u qu v? mu?n xem m?u hnh c?a cc k?t qu?. Ghi l?i k?t qu? ???ng huy?t v qu v? c th? qun nh?ng g ? x?y ra t?i ?ng th?i ?i?m ?. B?o qu?n t?t s? ghi chp s? gip qu v? v chuyn gia ch?m [place] s?c kh?e lm vi?c cng nhau ?? qu?n l t?t b?nh ti?u ???ng.  Thng tin ny khng nh?m m?c ?ch thay th? cho l?i khuyn m chuyn gia ch?m [place] s?c kh?e ni v?i qu v?. Hy b?o ??m qu v? ph?i th?o lu?n b?t k? v?n ?? g m qu v? c v?i chuyn gia ch?m [place] s?c kh?e c?a qu v?. Document Released: 01/25/2015 Elsevier Interactive Patient Education  2017 [employer].

## 2016-02-26 NOTE — Progress Notes (Signed)
Patient is here for HTN FU  Patient denies pain at this time.  Patient has taken medication around 8 am this morning. Patient has not eaten today.

## 2016-02-26 NOTE — Progress Notes (Addendum)
Subjective:    Patient ID: Aimee Parker, female    DOB: 13-May-1950, 66 y.o.   MRN: [id number]  HPI  Here with a family friend who is interpreting for her.  Aimee Parker is a 66 y.o. female with history of hypertension, type 2 diabetes mellitus requiring insulin and hypertriglyceridemia heretodayfor a follow up visitand medication refills. Patient report feel well but her blood pressure is still elevated. On review of her medication, it was noted that she has not been taking Lisinopril which is part of her regimen,did not refill the medication. She reports no side effect from her other medications. Patient denies headache,chest pain, abdominal pain, Nausea, new weakness tingling or numbness. Denies fever, chills, or SOB.  Review of Systems  Constitutional: Negative for activity change, appetite change and unexpected weight change.  HENT: Negative.   Eyes: Negative for photophobia and visual disturbance.  Respiratory: Negative for cough and chest tightness.   Cardiovascular: Negative for palpitations and leg swelling.  Gastrointestinal: Negative for abdominal distention, blood in stool and constipation.  Genitourinary: Negative for dysuria, pelvic pain and vaginal bleeding.  Musculoskeletal: Negative for arthralgias and joint swelling.  Neurological: Negative for dizziness, light-headedness and headaches.  Hematological: Negative.   Psychiatric/Behavioral: Negative for sleep disturbance.      Objective:  BP (!) 161/67 (BP Location: Right Arm, Patient Position: Sitting, Cuff Size: Normal)   Pulse 61   Temp 98.2 F (36.8 C) (Oral)   Resp 18   Ht 5\' 2"  (1.575 m)   Wt 130 lb 3.2 oz (59.1 kg)   SpO2 99%   BMI 23.81 kg/m   HgbA1C 7.9 up from 7.8 at last visit Fasting CBG 113   Physical Exam  Constitutional: She appears well-developed and well-nourished. No distress.  HENT:  Head: Normocephalic and atraumatic.  Mouth/Throat: Oropharynx is clear and moist.  Eyes: Conjunctivae  and EOM are normal. Pupils are equal, round, and reactive to light.  Neck: Normal range of motion. Neck supple. No thyromegaly present.  Cardiovascular: Normal rate, regular rhythm and intact distal pulses.   Murmur heard. Pulmonary/Chest: Effort normal and breath sounds normal. No respiratory distress. She exhibits no tenderness.  Abdominal: Soft. Bowel sounds are normal. There is no tenderness.  Musculoskeletal: Normal range of motion. She exhibits no edema.  Lymphadenopathy:    She has no cervical adenopathy.  Neurological: She is alert.  Skin: Skin is warm and dry.  Psychiatric: Her behavior is normal.      Assessment & Plan:  1. Type 2 diabetes mellitus without complication, with long-term current use of insulin (HCC)  A1C stable at 7.9 Carb modified diet discussed and encouraged Simple foot exam completed - POCT A1C - Glucose (CBG) - metFORMIN (GLUCOPHAGE) 1000 MG tablet; Take 1 tablet (1,000 mg total) by mouth 2 (two) times daily with a meal.  Dispense: 180 tablet; Refill: 3 - insulin NPH-regular Human (NOVOLIN 70/30) (70-30) 100 UNIT/ML injection; Inject 20 Units into the skin 2 (two) times daily with a meal.  Dispense: 30 mL; Refill: 3 - Ambulatory referral to Podiatry - Ambulatory referral to Ophthalmology  Aim for 30 minutes of exercise most days. Rethink what you drink. Water is great! Aim for 2-3 Carb Choices per meal (30-45 grams) +/- 1 either way  Aim for 0-15 Carbs per snack if hungry  Include protein in moderation with your meals and snacks  Consider reading food labels for Total Carbohydrate and Fat Grams of foods  Consider checking BG at alternate times per  day  Continue taking medication as directed Be mindful about how much sugar you are adding to beverages and other foods. Fruit Punch - find one with no sugar  Measure and decrease portions of carbohydrate foods  Make your plate and don't go back for seconds  2. Essential hypertension,  benign  Uncontrolled, was not taking Lisinopril, ensure you refill all your medicine Low salt diet, do not add extra salt to meals - lisinopril (PRINIVIL,ZESTRIL) 40 MG tablet; Take 1 tablet (40 mg total) by mouth daily.  Dispense: 90 tablet; Refill: 3 - hydrochlorothiazide (HYDRODIURIL) 25 MG tablet; Take 1 tablet (25 mg total) by mouth daily.  Dispense: 90 tablet; Refill: 3 - amLODipine (NORVASC) 10 MG tablet; Take 1 tablet (10 mg total) by mouth daily.  Dispense: 90 tablet; Refill: 3  3. Hypertriglyceridemia  Low fat diet - fenofibrate 160 MG tablet; Take 1 tablet (160 mg total) by mouth daily.  Dispense: 90 tablet; Refill: 3  Health maintenance reviewed Diet and exercise encouraged Continue all meds Follow up  in 3 months   Reuel BoomQueeneth Dejanira Pamintuan, RN, BSN, AGNP-Student  Evaluation and management procedures were performed by me with DNP Student in attendance, note written by DNP student under my supervision and collaboration. I have reviewed the note and I agree with the management and plan.   Jeanann LewandowskyJEGEDE, OLUGBEMIGA, MD, MHA, CPE, FACP, FAAP [facility] [place], [place] [phone]   02/27/2016, 11:45 AM

## 2016-02-27 ENCOUNTER — Encounter: Payer: Self-pay | Admitting: Internal Medicine

## 2016-03-03 ENCOUNTER — Other Ambulatory Visit: Payer: Self-pay | Admitting: Pharmacist

## 2016-03-03 MED ORDER — FENOFIBRATE 145 MG PO TABS: 145.0000 mg | ORAL_TABLET | Freq: Every day | ORAL | 3 refills | Status: DC

## 2016-03-13 ENCOUNTER — Telehealth: Payer: Self-pay | Admitting: Internal Medicine

## 2016-03-13 NOTE — Telephone Encounter (Signed)
Debbie from [facility] called to inform PCP that patient was scheduled to see them on 3/8 for diabetic exam but patient called this morning to cancel appt and they didn't want to reschdule.   Thank you.

## 2016-03-19 MED FILL — LISINOPRIL 40 MG TABLET: 40 | 30 days supply | Qty: 30 | Fill #1

## 2016-04-21 MED FILL — HYDROCHLOROTHIAZIDE 25 MG T: 25 | 90 days supply | Qty: 90 | Fill #1

## 2016-04-21 MED FILL — AMLODIPINE BESYLATE 10 MG T: 10 | 90 days supply | Qty: 90 | Fill #3

## 2016-04-21 MED FILL — FENOFIBRATE 160 MG TABLET: 160 | 90 days supply | Qty: 90 | Fill #1

## 2016-04-21 MED FILL — LISINOPRIL 40 MG TABLET: 40 | 30 days supply | Qty: 30 | Fill #2

## 2016-04-21 MED FILL — metFORMIN HCL 1000 MG TABS: 1000 | 90 days supply | Qty: 180 | Fill #1

## 2016-05-22 MED FILL — LISINOPRIL 40 MG TABLET: 40 | 30 days supply | Qty: 30 | Fill #3

## 2016-06-18 MED FILL — LISINOPRIL 40 MG TABLET: 40 | 30 days supply | Qty: 30 | Fill #4

## 2016-07-20 MED FILL — FENOFIBRATE 160 MG TABLET: 160 | 30 days supply | Qty: 30 | Fill #2

## 2016-07-20 MED FILL — AMLODIPINE BESYLATE 10 MG T: 10 | 30 days supply | Qty: 30 | Fill #4

## 2016-07-20 MED FILL — HYDROCHLOROTHIAZIDE 25 MG T: 25 | 90 days supply | Qty: 90 | Fill #2

## 2016-07-20 MED FILL — metFORMIN HCL 1000 MG TABS: 1000 | 90 days supply | Qty: 180 | Fill #2

## 2016-07-20 MED FILL — LISINOPRIL 40 MG TABLET: 40 | 30 days supply | Qty: 30 | Fill #5

## 2016-08-17 MED FILL — LISINOPRIL 40 MG TABLET: 40 | 30 days supply | Qty: 30 | Fill #6

## 2016-08-17 MED FILL — FENOFIBRATE 160 MG TABLET: 160 | 30 days supply | Qty: 30 | Fill #3

## 2016-09-09 ENCOUNTER — Ambulatory Visit: Payer: Medicare Other | Attending: Internal Medicine | Admitting: Internal Medicine

## 2016-09-09 ENCOUNTER — Other Ambulatory Visit: Payer: Self-pay | Admitting: Internal Medicine

## 2016-09-09 ENCOUNTER — Encounter: Payer: Self-pay | Admitting: Internal Medicine

## 2016-09-09 VITALS — BP 159/73 | HR 64 | Temp 98.5°F | Resp 18 | Ht 60.0 in | Wt 129.0 lb

## 2016-09-09 DIAGNOSIS — E781 Pure hyperglyceridemia: Secondary | ICD-10-CM | POA: Insufficient documentation

## 2016-09-09 DIAGNOSIS — E119 Type 2 diabetes mellitus without complications: Secondary | ICD-10-CM

## 2016-09-09 DIAGNOSIS — Z794 Long term (current) use of insulin: Secondary | ICD-10-CM | POA: Diagnosis not present

## 2016-09-09 DIAGNOSIS — Z0000 Encounter for general adult medical examination without abnormal findings: Secondary | ICD-10-CM

## 2016-09-09 DIAGNOSIS — I10 Essential (primary) hypertension: Secondary | ICD-10-CM | POA: Diagnosis not present

## 2016-09-09 DIAGNOSIS — Z79899 Other long term (current) drug therapy: Secondary | ICD-10-CM | POA: Diagnosis not present

## 2016-09-09 LAB — GLUCOSE, POCT (MANUAL RESULT ENTRY): POC Glucose: 100 mg/dl — AB (ref 70–99)

## 2016-09-09 LAB — POCT GLYCOSYLATED HEMOGLOBIN (HGB A1C): HEMOGLOBIN A1C: 7.8

## 2016-09-09 MED ORDER — LISINOPRIL 40 MG PO TABS: 40.0000 mg | ORAL_TABLET | Freq: Every day | ORAL | 3 refills | Status: DC

## 2016-09-09 MED ORDER — AMLODIPINE BESYLATE 10 MG PO TABS: 10.0000 mg | ORAL_TABLET | Freq: Every day | ORAL | 3 refills | Status: DC

## 2016-09-09 MED ORDER — TRUEPLUS LANCETS 28G MISC: 12 refills | Status: DC

## 2016-09-09 MED ORDER — ACCU-CHEK AVIVA PLUS W/DEVICE KIT: 1.0000 | PACK | Freq: Three times a day (TID) | 0 refills | Status: DC

## 2016-09-09 MED ORDER — METFORMIN HCL 1000 MG PO TABS: 1000.0000 mg | ORAL_TABLET | Freq: Two times a day (BID) | ORAL | 3 refills | Status: DC

## 2016-09-09 MED ORDER — INSULIN NPH ISOPHANE & REGULAR (70-30) 100 UNIT/ML ~~LOC~~ SUSP: 20.0000 [IU] | Freq: Two times a day (BID) | SUBCUTANEOUS | 11 refills | Status: DC

## 2016-09-09 MED ORDER — INSULIN NPH ISOPHANE & REGULAR (70-30) 100 UNIT/ML ~~LOC~~ SUSP: 20.0000 [IU] | Freq: Two times a day (BID) | SUBCUTANEOUS | 3 refills | Status: DC

## 2016-09-09 MED ORDER — HYDROCHLOROTHIAZIDE 25 MG PO TABS: 25.0000 mg | ORAL_TABLET | Freq: Every day | ORAL | 3 refills | Status: DC

## 2016-09-09 MED ORDER — GLUCOSE BLOOD VI STRP: ORAL_STRIP | 12 refills | Status: DC

## 2016-09-09 MED ORDER — FENOFIBRATE 145 MG PO TABS: 145.0000 mg | ORAL_TABLET | Freq: Every day | ORAL | 3 refills | Status: DC

## 2016-09-09 MED ORDER — ACCU-CHEK SOFTCLIX LANCET DEV MISC: 0 refills | Status: DC

## 2016-09-09 MED FILL — ACCU-CHEK AVIVA PLUS TEST S: 30 days supply | Qty: 100 | Fill #0

## 2016-09-09 MED FILL — ACCU-CHEK SOFTCLIX LANCETS: 30 days supply | Qty: 100 | Fill #0

## 2016-09-09 MED FILL — AMLODIPINE BESYLATE 10 MG T: 10 | 90 days supply | Qty: 90 | Fill #0

## 2016-09-09 MED FILL — FENOFIBRATE 145 MG TAB: 145 | 30 days supply | Qty: 30 | Fill #0

## 2016-09-09 MED FILL — LISINOPRIL 40 MG TABLET: 40 | 90 days supply | Qty: 90 | Fill #0

## 2016-09-09 MED FILL — ACCU-CHEK AVIVA PLUS METER: W/DEVICE | 365 days supply | Qty: 1 | Fill #0

## 2016-09-09 NOTE — Progress Notes (Signed)
Aimee Parker, is a 66 y.o. female  YYT:[id number]  EXN:[id number]  DOB - 1950/12/02  Chief Complaint  Patient presents with  . Diabetes      Subjective:   Aimee Parker is a 66 y.o. female with history of hypertension, type 2 diabetes mellitus requiring insulin and hypertriglyceridemia presents here today for a routine follow up visit and medication refills.Patient has no new complaint today. She claims adherence to her medications but ran out a few days ago. She claims her blood sugars better controlled. She is of very good spirits today. She denies being depressed. Patient has No headache, No chest pain, No abdominal pain - No Nausea, No new weakness tingling or numbness, No Cough - SOB.  Problem  Healthcare Maintenance  Type 2 Diabetes Mellitus Without Complication, With Long-Term Current Use of Insulin ([facility])   ALLERGIES: No Known Allergies  PAST MEDICAL HISTORY: Past Medical History:  Diagnosis Date  . Hypertension    MEDICATIONS AT HOME: Prior to Admission medications   Medication Sig Start Date End Date Taking? Authorizing Provider  amLODipine (NORVASC) 10 MG tablet Take 1 tablet (10 mg total) by mouth daily. 09/09/16  Yes Tresa Garter, MD  Blood Glucose Monitoring Suppl (TRUE METRIX METER) W/DEVICE KIT 1 each by Does not apply route 3 (three) times daily. 11/15/14  Yes Tresa Garter, MD  fenofibrate (TRICOR) 145 MG tablet Take 1 tablet (145 mg total) by mouth daily. 09/09/16  Yes Angelica Chessman E, MD  glucose blood (TRUE METRIX BLOOD GLUCOSE TEST) test strip Use as instructed 09/09/16  Yes Cuca Benassi E, MD  hydrochlorothiazide (HYDRODIURIL) 25 MG tablet Take 1 tablet (25 mg total) by mouth daily. 09/09/16  Yes Tresa Garter, MD  insulin NPH-regular Human (NOVOLIN 70/30) (70-30) 100 UNIT/ML injection Inject 20 Units into the skin 2 (two) times daily with a meal. 09/09/16  Yes Shanesha Bednarz E, MD  Insulin Syringe-Needle U-100 (BD INSULIN SYRINGE  ULTRAFINE) 31G X 15/64" 0.3 ML MISC USE TO INJECT INSULIN AS DIRECTED BY PHYSICIAN 08/29/15  Yes Garrick Midgley E, MD  lisinopril (PRINIVIL,ZESTRIL) 40 MG tablet Take 1 tablet (40 mg total) by mouth daily. 09/09/16  Yes Tresa Garter, MD  meclizine (ANTIVERT) 25 MG tablet Take 1 tablet (25 mg total) by mouth 3 (three) times daily as needed for dizziness. 11/30/12  Yes Mabe, Shanon Brow, NP  metFORMIN (GLUCOPHAGE) 1000 MG tablet Take 1 tablet (1,000 mg total) by mouth 2 (two) times daily with a meal. 09/09/16  Yes Nayda Riesen E, MD  methocarbamol (ROBAXIN) 500 MG tablet Take 1 tablet (500 mg total) by mouth 4 (four) times daily. X 1 week then prn muscle pain 12/19/15  Yes Freeman Caldron M, PA-C  naproxen (NAPROSYN) 500 MG tablet Take 1 tablet (500 mg total) by mouth 2 (two) times daily with a meal. X 1 week then prn pain 12/19/15  Yes Freeman Caldron M, PA-C  TRUEPLUS LANCETS 28G MISC Test Blood Sugar 3 times daily 09/09/16  Yes [place] Pettengill E, MD  valACYclovir (VALTREX) 500 MG tablet Take 1 tablet (500 mg total) by mouth 2 (two) times daily. 12/19/15  Yes [place] Donovan, PA-C  Vitamin D, Ergocalciferol, (DRISDOL) 50000 UNITS CAPS capsule Take 1 capsule (50,000 Units total) by mouth every 7 (seven) days. 04/04/13  Yes Tresa Garter, MD   Objective:   Vitals:   09/09/16 1356  BP: (!) 159/73  Pulse: 64  Resp: 18  Temp: 98.5 F (36.9 C)  TempSrc:  Oral  SpO2: 99%  Weight: 129 lb (58.5 kg)  Height: 5' (1.524 m)   Exam General appearance : Awake, alert, not in any distress. Speech Clear. Not toxic looking HEENT: Atraumatic and Normocephalic, pupils equally reactive to light and accomodation Neck: Supple, no JVD. No cervical lymphadenopathy.  Chest: Good air entry bilaterally, no added sounds  CVS: S1 S2 regular, no murmurs.  Abdomen: Bowel sounds present, Non tender and not distended with no gaurding, rigidity or rebound. Extremities: B/L Lower Ext shows no edema, both  legs are warm to touch Neurology: Awake alert, and oriented X 3, CN II-XII intact, Non focal Skin: No Rash  Data Review Lab Results  Component Value Date   HGBA1C 7.8 09/09/2016   HGBA1C 7.9 02/26/2016   HGBA1C 7.8 11/28/2015    Assessment & Plan   1. Type 2 diabetes mellitus without complication, with long-term current use of insulin ([place])  - Ambulatory referral to Ophthalmology for diabetic eye exam - POCT A1C - Glucose (CBG)  - glucose blood (TRUE METRIX BLOOD GLUCOSE TEST) test strip; Use as instructed  Dispense: 100 each; Refill: 12 - insulin NPH-regular Human (NOVOLIN 70/30) (70-30) 100 UNIT/ML injection; Inject 20 Units into the skin 2 (two) times daily with a meal.  Dispense: 30 mL; Refill: 3 - metFORMIN (GLUCOPHAGE) 1000 MG tablet; Take 1 tablet (1,000 mg total) by mouth 2 (two) times daily with a meal.  Dispense: 180 tablet; Refill: 3 - TRUEPLUS LANCETS 28G MISC; Test Blood Sugar 3 times daily  Dispense: 100 each; Refill: 12  2. Essential hypertension: Controlled   - CMP14+EGFR  - amLODipine (NORVASC) 10 MG tablet; Take 1 tablet (10 mg total) by mouth daily.  Dispense: 90 tablet; Refill: 3 - hydrochlorothiazide (HYDRODIURIL) 25 MG tablet; Take 1 tablet (25 mg total) by mouth daily.  Dispense: 90 tablet; Refill: 3 - lisinopril (PRINIVIL,ZESTRIL) 40 MG tablet; Take 1 tablet (40 mg total) by mouth daily.  Dispense: 90 tablet; Refill: 3  3. Healthcare maintenance  - CBC with Differential/Platelet - Lipid panel - TSH  - VITAMIN D 25 Hydroxy (Vit-D Deficiency, Fractures) - Ambulatory referral to Gastroenterology for colonoscopy - MM Digital Screening; Future - Hepatitis C antibody, reflex  4. Hypertriglyceridemia  - fenofibrate (TRICOR) 145 MG tablet; Take 1 tablet (145 mg total) by mouth daily.  Dispense: 90 tablet; Refill: 3  To address this please limit saturated fat to no more than 7% of your calories, limit cholesterol to 200 mg/day, increase fiber and  exercise as tolerated. If needed we may add another cholesterol lowering medication to your regimen.   Patient have been counseled extensively about nutrition and exercise. Other issues discussed during this visit include: low cholesterol diet, weight control and daily exercise, foot care, annual eye examinations at Ophthalmology, importance of adherence with medications and regular follow-up. We also discussed long term complications of uncontrolled diabetes and hypertension.   Interpreter was used to communicate directly with patient for the entire encounter including providing detailed patient instructions.   Return in about 3 months (around 12/10/2016) for Hemoglobin A1C and Follow up, DM, Follow up HTN.  The patient was given clear instructions to go to ER or return to medical center if symptoms don't improve, worsen or new problems develop. The patient verbalized understanding. The patient was told to call to get lab results if they haven't heard anything in the next week.   This note has been created with Surveyor, quantity. Any transcriptional  errors are unintentional.    Angelica Chessman, MD, [place], [place], [place], [place] and [place], [place]   09/09/2016, 2:24 PM

## 2016-09-09 NOTE — Patient Instructions (Signed)
DASH Eating Plan DASH stands for "Dietary Approaches to Stop Hypertension." The DASH eating plan is a healthy eating plan that has been shown to reduce high blood pressure (hypertension). It may also reduce your risk for type 2 diabetes, heart disease, and stroke. The DASH eating plan may also help with weight loss. What are tips for following this plan? General guidelines  Avoid eating more than 2,300 mg (milligrams) of salt (sodium) a day. If you have hypertension, you may need to reduce your sodium intake to 1,500 mg a day.  Limit alcohol intake to no more than 1 drink a day for nonpregnant women and 2 drinks a day for men. One drink equals 12 oz of beer, 5 oz of wine, or 1 oz of hard liquor.  Work with your health care provider to maintain a healthy body weight or to lose weight. Ask what an ideal weight is for you.  Get at least 30 minutes of exercise that causes your heart to beat faster (aerobic exercise) most days of the week. Activities may include walking, swimming, or biking.  Work with your health care provider or diet and nutrition specialist (dietitian) to adjust your eating plan to your individual calorie needs. Reading food labels  Check food labels for the amount of sodium per serving. Choose foods with less than 5 percent of the Daily Value of sodium. Generally, foods with less than 300 mg of sodium per serving fit into this eating plan.  To find whole grains, look for the word "whole" as the first word in the ingredient list. Shopping  Buy products labeled as "low-sodium" or "no salt added."  Buy fresh foods. Avoid canned foods and premade or frozen meals. Cooking  Avoid adding salt when cooking. Use salt-free seasonings or herbs instead of table salt or sea salt. Check with your health care provider or pharmacist before using salt substitutes.  Do not fry foods. Cook foods using healthy methods such as baking, boiling, grilling, and broiling instead.  Cook with  heart-healthy oils, such as olive, canola, soybean, or sunflower oil. Meal planning   Eat a balanced diet that includes: ? 5 or more servings of fruits and vegetables each day. At each meal, try to fill half of your plate with fruits and vegetables. ? Up to 6-8 servings of whole grains each day. ? Less than 6 oz of lean meat, poultry, or fish each day. A 3-oz serving of meat is about the same size as a deck of cards. One egg equals 1 oz. ? 2 servings of low-fat dairy each day. ? A serving of nuts, seeds, or beans 5 times each week. ? Heart-healthy fats. Healthy fats called Omega-3 fatty acids are found in foods such as flaxseeds and coldwater fish, like sardines, salmon, and mackerel.  Limit how much you eat of the following: ? Canned or prepackaged foods. ? Food that is high in trans fat, such as fried foods. ? Food that is high in saturated fat, such as fatty meat. ? Sweets, desserts, sugary drinks, and other foods with added sugar. ? Full-fat dairy products.  Do not salt foods before eating.  Try to eat at least 2 vegetarian meals each week.  Eat more home-cooked food and less restaurant, buffet, and fast food.  When eating at a restaurant, ask that your food be prepared with less salt or no salt, if possible. What foods are recommended? The items listed may not be a complete list. Talk with your dietitian about what   dietary choices are best for you. Grains Whole-grain or whole-wheat bread. Whole-grain or whole-wheat pasta. Brown rice. Oatmeal. Quinoa. Bulgur. Whole-grain and low-sodium cereals. Pita bread. Low-fat, low-sodium crackers. Whole-wheat flour tortillas. Vegetables Fresh or frozen vegetables (raw, steamed, roasted, or grilled). Low-sodium or reduced-sodium tomato and vegetable juice. Low-sodium or reduced-sodium tomato sauce and tomato paste. Low-sodium or reduced-sodium canned vegetables. Fruits All fresh, dried, or frozen fruit. Canned fruit in natural juice (without  added sugar). Meat and other protein foods Skinless chicken or turkey. Ground chicken or turkey. Pork with fat trimmed off. Fish and seafood. Egg whites. Dried beans, peas, or lentils. Unsalted nuts, nut butters, and seeds. Unsalted canned beans. Lean cuts of beef with fat trimmed off. Low-sodium, lean deli meat. Dairy Low-fat (1%) or fat-free (skim) milk. Fat-free, low-fat, or reduced-fat cheeses. Nonfat, low-sodium ricotta or cottage cheese. Low-fat or nonfat yogurt. Low-fat, low-sodium cheese. Fats and oils Soft margarine without trans fats. Vegetable oil. Low-fat, reduced-fat, or light mayonnaise and salad dressings (reduced-sodium). Canola, safflower, olive, soybean, and sunflower oils. Avocado. Seasoning and other foods Herbs. Spices. Seasoning mixes without salt. Unsalted popcorn and pretzels. Fat-free sweets. What foods are not recommended? The items listed may not be a complete list. Talk with your dietitian about what dietary choices are best for you. Grains Baked goods made with fat, such as croissants, muffins, or some breads. Dry pasta or rice meal packs. Vegetables Creamed or fried vegetables. Vegetables in a cheese sauce. Regular canned vegetables (not low-sodium or reduced-sodium). Regular canned tomato sauce and paste (not low-sodium or reduced-sodium). Regular tomato and vegetable juice (not low-sodium or reduced-sodium). Pickles. Olives. Fruits Canned fruit in a light or heavy syrup. Fried fruit. Fruit in cream or butter sauce. Meat and other protein foods Fatty cuts of meat. Ribs. Fried meat. Bacon. Sausage. Bologna and other processed lunch meats. Salami. Fatback. Hotdogs. Bratwurst. Salted nuts and seeds. Canned beans with added salt. Canned or smoked fish. Whole eggs or egg yolks. Chicken or turkey with skin. Dairy Whole or 2% milk, cream, and half-and-half. Whole or full-fat cream cheese. Whole-fat or sweetened yogurt. Full-fat cheese. Nondairy creamers. Whipped toppings.  Processed cheese and cheese spreads. Fats and oils Butter. Stick margarine. Lard. Shortening. Ghee. Bacon fat. Tropical oils, such as coconut, palm kernel, or palm oil. Seasoning and other foods Salted popcorn and pretzels. Onion salt, garlic salt, seasoned salt, table salt, and sea salt. Worcestershire sauce. Tartar sauce. Barbecue sauce. Teriyaki sauce. Soy sauce, including reduced-sodium. Steak sauce. Canned and packaged gravies. Fish sauce. Oyster sauce. Cocktail sauce. Horseradish that you find on the shelf. Ketchup. Mustard. Meat flavorings and tenderizers. Bouillon cubes. Hot sauce and Tabasco sauce. Premade or packaged marinades. Premade or packaged taco seasonings. Relishes. Regular salad dressings. Where to find more information:  National Heart, Lung, and Blood Institute: www.nhlbi.nih.gov  American Heart Association: www.heart.org Summary  The DASH eating plan is a healthy eating plan that has been shown to reduce high blood pressure (hypertension). It may also reduce your risk for type 2 diabetes, heart disease, and stroke.  With the DASH eating plan, you should limit salt (sodium) intake to 2,300 mg a day. If you have hypertension, you may need to reduce your sodium intake to 1,500 mg a day.  When on the DASH eating plan, aim to eat more fresh fruits and vegetables, whole grains, lean proteins, low-fat dairy, and heart-healthy fats.  Work with your health care provider or diet and nutrition specialist (dietitian) to adjust your eating plan to your individual   calorie needs. This information is not intended to replace advice given to you by your health care provider. Make sure you discuss any questions you have with your health care provider. Document Released: 12/18/2010 Document Revised: 12/23/2015 Document Reviewed: 12/23/2015 Elsevier Interactive Patient Education  2017 [employer] Hypertension Hypertension, commonly called high blood pressure, is when the force of blood  pumping through the arteries is too strong. The arteries are the blood vessels that carry blood from the heart throughout the body. Hypertension forces the heart to work harder to pump blood and may cause arteries to become narrow or stiff. Having untreated or uncontrolled hypertension can cause heart attacks, strokes, kidney disease, and other problems. A blood pressure reading consists of a higher number over a lower number. Ideally, your blood pressure should be below 120/80. The first ("top") number is called the systolic pressure. It is a measure of the pressure in your arteries as your heart beats. The second ("bottom") number is called the diastolic pressure. It is a measure of the pressure in your arteries as the heart relaxes. What are the causes? The cause of this condition is not known. What increases the risk? Some risk factors for high blood pressure are under your control. Others are not. Factors you can change  Smoking.  Having type 2 diabetes mellitus, high cholesterol, or both.  Not getting enough exercise or physical activity.  Being overweight.  Having too much fat, sugar, calories, or salt (sodium) in your diet.  Drinking too much alcohol. Factors that are difficult or impossible to change  Having chronic kidney disease.  Having a family history of high blood pressure.  Age. Risk increases with age.  Race. You may be at higher risk if you are African-American.  Gender. Men are at higher risk than women before age 45. After age 65, women are at higher risk than men.  Having obstructive sleep apnea.  Stress. What are the signs or symptoms? Extremely high blood pressure (hypertensive crisis) may cause:  Headache.  Anxiety.  Shortness of breath.  Nosebleed.  Nausea and vomiting.  Severe chest pain.  Jerky movements you cannot control (seizures).  How is this diagnosed? This condition is diagnosed by measuring your blood pressure while you are  seated, with your arm resting on a surface. The cuff of the blood pressure monitor will be placed directly against the skin of your upper arm at the level of your heart. It should be measured at least twice using the same arm. Certain conditions can cause a difference in blood pressure between your right and left arms. Certain factors can cause blood pressure readings to be lower or higher than normal (elevated) for a short period of time:  When your blood pressure is higher when you are in a health care provider's office than when you are at home, this is called white coat hypertension. Most people with this condition do not need medicines.  When your blood pressure is higher at home than when you are in a health care provider's office, this is called masked hypertension. Most people with this condition may need medicines to control blood pressure.  If you have a high blood pressure reading during one visit or you have normal blood pressure with other risk factors:  You may be asked to return on a different day to have your blood pressure checked again.  You may be asked to monitor your blood pressure at home for 1 week or longer.  If you are diagnosed   with hypertension, you may have other blood or imaging tests to help your health care provider understand your overall risk for other conditions. How is this treated? This condition is treated by making healthy lifestyle changes, such as eating healthy foods, exercising more, and reducing your alcohol intake. Your health care provider may prescribe medicine if lifestyle changes are not enough to get your blood pressure under control, and if:  Your systolic blood pressure is above 130.  Your diastolic blood pressure is above 80.  Your personal target blood pressure may vary depending on your medical conditions, your age, and other factors. Follow these instructions at home: Eating and drinking  Eat a diet that is high in fiber and potassium,  and low in sodium, added sugar, and fat. An example eating plan is called the DASH (Dietary Approaches to Stop Hypertension) diet. To eat this way: ? Eat plenty of fresh fruits and vegetables. Try to fill half of your plate at each meal with fruits and vegetables. ? Eat whole grains, such as whole wheat pasta, brown rice, or whole grain bread. Fill about one quarter of your plate with whole grains. ? Eat or drink low-fat dairy products, such as skim milk or low-fat yogurt. ? Avoid fatty cuts of meat, processed or cured meats, and poultry with skin. Fill about one quarter of your plate with lean proteins, such as fish, chicken without skin, beans, eggs, and tofu. ? Avoid premade and processed foods. These tend to be higher in sodium, added sugar, and fat.  Reduce your daily sodium intake. Most people with hypertension should eat less than 1,500 mg of sodium a day.  Limit alcohol intake to no more than 1 drink a day for nonpregnant women and 2 drinks a day for men. One drink equals 12 oz of beer, 5 oz of wine, or 1 oz of hard liquor. Lifestyle  Work with your health care provider to maintain a healthy body weight or to lose weight. Ask what an ideal weight is for you.  Get at least 30 minutes of exercise that causes your heart to beat faster (aerobic exercise) most days of the week. Activities may include walking, swimming, or biking.  Include exercise to strengthen your muscles (resistance exercise), such as pilates or lifting weights, as part of your weekly exercise routine. Try to do these types of exercises for 30 minutes at least 3 days a week.  Do not use any products that contain nicotine or tobacco, such as cigarettes and e-cigarettes. If you need help quitting, ask your health care provider.  Monitor your blood pressure at home as told by your health care provider.  Keep all follow-up visits as told by your health care provider. This is important. Medicines  Take over-the-counter and  prescription medicines only as told by your health care provider. Follow directions carefully. Blood pressure medicines must be taken as prescribed.  Do not skip doses of blood pressure medicine. Doing this puts you at risk for problems and can make the medicine less effective.  Ask your health care provider about side effects or reactions to medicines that you should watch for. Contact a health care provider if:  You think you are having a reaction to a medicine you are taking.  You have headaches that keep coming back (recurring).  You feel dizzy.  You have swelling in your ankles.  You have trouble with your vision. Get help right away if:  You develop a severe headache or confusion.  You   have unusual weakness or numbness.  You feel faint.  You have severe pain in your chest or abdomen.  You vomit repeatedly.  You have trouble breathing. Summary  Hypertension is when the force of blood pumping through your arteries is too strong. If this condition is not controlled, it may put you at risk for serious complications.  Your personal target blood pressure may vary depending on your medical conditions, your age, and other factors. For most people, a normal blood pressure is less than 120/80.  Hypertension is treated with lifestyle changes, medicines, or a combination of both. Lifestyle changes include weight loss, eating a healthy, low-sodium diet, exercising more, and limiting alcohol. This information is not intended to replace advice given to you by your health care provider. Make sure you discuss any questions you have with your health care provider. Document Released: 12/29/2004 Document Revised: 11/27/2015 Document Reviewed: 11/27/2015 Elsevier Interactive Patient Education  2018 [employer] Blood Glucose Monitoring, Adult Monitoring your blood sugar (glucose) helps you manage your diabetes. It also helps you and your health care provider determine how well your  diabetes management plan is working. Blood glucose monitoring involves checking your blood glucose as often as directed, and keeping a record (log) of your results over time. Why should I monitor my blood glucose? Checking your blood glucose regularly can:  Help you understand how food, exercise, illnesses, and medicines affect your blood glucose.  Let you know what your blood glucose is at any time. You can quickly tell if you are having low blood glucose (hypoglycemia) or high blood glucose (hyperglycemia).  Help you and your health care provider adjust your medicines as needed.  When should I check my blood glucose? Follow instructions from your health care provider about how often to check your blood glucose. This may depend on:  The type of diabetes you have.  How well-controlled your diabetes is.  Medicines you are taking.  If you have type 1 diabetes:  Check your blood glucose at least 2 times a day.  Also check your blood glucose: ? Before every insulin injection. ? Before and after exercise. ? Between meals. ? 2 hours after a meal. ? Occasionally between 2:00 a.m. and 3:00 a.m., as directed. ? Before potentially dangerous tasks, like driving or using heavy machinery. ? At bedtime.  You may need to check your blood glucose more often, up to 6-10 times a day: ? If you use an insulin pump. ? If you need multiple daily injections (MDI). ? If your diabetes is not well-controlled. ? If you are ill. ? If you have a history of severe hypoglycemia. ? If you have a history of not knowing when your blood glucose is getting low (hypoglycemia unawareness). If you have type 2 diabetes:  If you take insulin or other diabetes medicines, check your blood glucose at least 2 times a day.  If you are on intensive insulin therapy, check your blood glucose at least 4 times a day. Occasionally, you may also need to check between 2:00 a.m. and 3:00 a.m., as directed.  Also check your  blood glucose: ? Before and after exercise. ? Before potentially dangerous tasks, like driving or using heavy machinery.  You may need to check your blood glucose more often if: ? Your medicine is being adjusted. ? Your diabetes is not well-controlled. ? You are ill. What is a blood glucose log?  A blood glucose log is a record of your blood glucose readings. It helps you  and your health care provider: ? Look for patterns in your blood glucose over time. ? Adjust your diabetes management plan as needed.  Every time you check your blood glucose, write down your result and notes about things that may be affecting your blood glucose, such as your diet and exercise for the day.  Most glucose meters store a record of glucose readings in the meter. Some meters allow you to download your records to a computer. How do I check my blood glucose? Follow these steps to get accurate readings of your blood glucose: Supplies needed   Blood glucose meter.  Test strips for your meter. Each meter has its own strips. You must use the strips that come with your meter.  A needle to prick your finger (lancet). Do not use lancets more than once.  A device that holds the lancet (lancing device).  A journal or log book to write down your results. Procedure  Wash your hands with soap and water.  Prick the side of your finger (not the tip) with the lancet. Use a different finger each time.  Gently rub the finger until a small drop of blood appears.  Follow instructions that come with your meter for inserting the test strip, applying blood to the strip, and using your blood glucose meter.  Write down your result and any notes. Alternative testing sites  Some meters allow you to use areas of your body other than your finger (alternative sites) to test your blood.  If you think you may have hypoglycemia, or if you have hypoglycemia unawareness, do not use alternative sites. Use your finger  instead.  Alternative sites may not be as accurate as the fingers, because blood flow is slower in these areas. This means that the result you get may be delayed, and it may be different from the result that you would get from your finger.  The most common alternative sites are: ? Forearm. ? Thigh. ? Palm of the hand. Additional tips  Always keep your supplies with you.  If you have questions or need help, all blood glucose meters have a 24-hour "hotline" number that you can call. You may also contact your health care provider.  After you use a few boxes of test strips, adjust (calibrate) your blood glucose meter by following instructions that came with your meter. This information is not intended to replace advice given to you by your health care provider. Make sure you discuss any questions you have with your health care provider. Document Released: 01/01/2003 Document Revised: 07/19/2015 Document Reviewed: 06/10/2015 Elsevier Interactive Patient Education  2017 [employer].

## 2016-09-10 LAB — CMP14+EGFR
ALK PHOS: 51 IU/L (ref 39–117)
ALT: 24 IU/L (ref 0–32)
AST: 30 IU/L (ref 0–40)
Albumin/Globulin Ratio: 1.5 (ref 1.2–2.2)
Albumin: 4.7 g/dL (ref 3.6–4.8)
BUN/Creatinine Ratio: 18 (ref 12–28)
BUN: 17 mg/dL (ref 8–27)
CHLORIDE: 105 mmol/L (ref 96–106)
CO2: 20 mmol/L (ref 20–29)
CREATININE: 0.94 mg/dL (ref 0.57–1.00)
Calcium: 9.9 mg/dL (ref 8.7–10.3)
GFR calc Af Amer: 73 mL/min/{1.73_m2} (ref >59)
GFR calc non Af Amer: 63 mL/min/{1.73_m2} (ref >59)
GLUCOSE: 86 mg/dL (ref 65–99)
Globulin, Total: 3.1 g/dL (ref 1.5–4.5)
Potassium: 4.2 mmol/L (ref 3.5–5.2)
Sodium: 145 mmol/L — ABNORMAL HIGH (ref 134–144)
Total Protein: 7.8 g/dL (ref 6.0–8.5)

## 2016-09-10 LAB — CBC WITH DIFFERENTIAL/PLATELET
Basophils Absolute: 0 10*3/uL (ref 0.0–0.2)
Basos: 0 %
EOS (ABSOLUTE): 0.4 10*3/uL (ref 0.0–0.4)
EOS: 4 %
HEMATOCRIT: 37.3 % (ref 34.0–46.6)
HEMOGLOBIN: 11.4 g/dL (ref 11.1–15.9)
IMMATURE GRANS (ABS): 0 10*3/uL (ref 0.0–0.1)
Immature Granulocytes: 0 %
LYMPHS ABS: 3.2 10*3/uL — AB (ref 0.7–3.1)
LYMPHS: 38 %
MCH: 21.9 pg — AB (ref 26.6–33.0)
MCHC: 30.6 g/dL — AB (ref 31.5–35.7)
MCV: 72 fL — ABNORMAL LOW (ref 79–97)
MONOCYTES: 4 %
Monocytes Absolute: 0.4 10*3/uL (ref 0.1–0.9)
NEUTROS ABS: 4.5 10*3/uL (ref 1.4–7.0)
Neutrophils: 54 %
Platelets: 343 10*3/uL (ref 150–379)
RBC: 5.21 x10E6/uL (ref 3.77–5.28)
RDW: 15.8 % — ABNORMAL HIGH (ref 12.3–15.4)
WBC: 8.5 10*3/uL (ref 3.4–10.8)

## 2016-09-10 LAB — LIPID PANEL
CHOLESTEROL TOTAL: 218 mg/dL — AB (ref 100–199)
Chol/HDL Ratio: 3 ratio (ref 0.0–4.4)
HDL: 72 mg/dL (ref >39)
LDL CALC: 117 mg/dL — AB (ref 0–99)
TRIGLYCERIDES: 144 mg/dL (ref 0–149)
VLDL CHOLESTEROL CAL: 29 mg/dL (ref 5–40)

## 2016-09-10 LAB — VITAMIN D 25 HYDROXY (VIT D DEFICIENCY, FRACTURES): VIT D 25 HYDROXY: 13.5 ng/mL — AB (ref 30.0–100.0)

## 2016-09-10 LAB — TSH: TSH: 5.62 u[IU]/mL — ABNORMAL HIGH (ref 0.450–4.500)

## 2016-09-10 LAB — HCV COMMENT:

## 2016-09-10 LAB — HEPATITIS C ANTIBODY (REFLEX): HCV AB: 0.1 {s_co_ratio} (ref 0.0–0.9)

## 2016-09-18 ENCOUNTER — Other Ambulatory Visit: Payer: Self-pay | Admitting: Internal Medicine

## 2016-09-18 ENCOUNTER — Telehealth (INDEPENDENT_AMBULATORY_CARE_PROVIDER_SITE_OTHER): Payer: Self-pay | Admitting: *Deleted

## 2016-09-18 MED ORDER — VITAMIN D (ERGOCALCIFEROL) 1.25 MG (50000 UNIT) PO CAPS: 50000.0000 [IU] | ORAL_CAPSULE | ORAL | 3 refills | Status: DC

## 2016-09-18 NOTE — Telephone Encounter (Signed)
MA unable to leave a message due to system not being up. !!!Please inform patient of cholesterol being high but improving. Patient also should pick up vitamin d supplement to help increase vitamin d level back to normal range. Thyroid will be checked at the next visit!!!

## 2016-09-18 NOTE — Telephone Encounter (Signed)
-----   Message from Quentin Angstlugbemiga E Jegede, MD sent at 09/18/2016 10:15 AM EDT ----- Please inform the patient that her results are mostly normal except for slightly high cholesterol but improving and low Vitamin D level. Please continue current medications and also please limit saturated fat to no more than 7% of your calories, limit cholesterol to 200 mg/day, increase fiber and exercise as tolerated. If needed we may add another cholesterol lowering medication to your regimen. We will reevaluate your thyroid function during your next visit. Vitamin D capsule has been prescribed to the pharmacy for pick up

## 2016-10-26 ENCOUNTER — Encounter: Payer: Self-pay | Admitting: Internal Medicine

## 2016-11-13 ENCOUNTER — Other Ambulatory Visit: Payer: Self-pay | Admitting: Internal Medicine

## 2016-11-13 DIAGNOSIS — Z794 Long term (current) use of insulin: Principal | ICD-10-CM

## 2016-11-13 DIAGNOSIS — E119 Type 2 diabetes mellitus without complications: Secondary | ICD-10-CM

## 2016-11-13 MED FILL — HYDROCHLOROTHIAZIDE 25 MG T: 25 | 90 days supply | Qty: 90 | Fill #3

## 2016-11-13 MED FILL — metFORMIN HCL 1000 MG TABS: 1000 | 30 days supply | Qty: 60 | Fill #3

## 2016-11-13 MED FILL — ACCU-CHEK AVIVA PLUS TEST S: 30 days supply | Qty: 100 | Fill #1

## 2016-11-13 MED FILL — FENOFIBRATE 145 MG TABLET: 145 | 30 days supply | Qty: 30 | Fill #1

## 2016-11-17 MED FILL — ACCU-CHEK SOFTCLIX LANCETS: 30 days supply | Qty: 100 | Fill #0

## 2016-12-14 MED FILL — FENOFIBRATE 145 MG TAB: 145 | 30 days supply | Qty: 30 | Fill #2

## 2016-12-14 MED FILL — LISINOPRIL 40 MG TAB: 40 | 90 days supply | Qty: 90 | Fill #1

## 2016-12-14 MED FILL — AMLODIPINE BESYLATE 10 MG T: 10 | 90 days supply | Qty: 90 | Fill #1

## 2017-01-13 MED FILL — metFORMIN HCL 1000 MG TABS: 1000 | 90 days supply | Qty: 180 | Fill #1

## 2017-01-13 MED FILL — FENOFIBRATE 145 MG TABS: 145 | 90 days supply | Qty: 90 | Fill #3

## 2017-03-15 ENCOUNTER — Other Ambulatory Visit: Payer: Self-pay | Admitting: Internal Medicine

## 2017-03-15 DIAGNOSIS — I10 Essential (primary) hypertension: Secondary | ICD-10-CM

## 2017-03-15 MED FILL — HYDROCHLOROTHIAZIDE 25 MG T: 25 | 30 days supply | Qty: 30 | Fill #0

## 2017-03-15 MED FILL — AMLODIPINE BESYLATE 10 MG T: 10 | 90 days supply | Qty: 90 | Fill #2

## 2017-04-07 ENCOUNTER — Ambulatory Visit (HOSPITAL_COMMUNITY)
Admission: EM | Admit: 2017-04-07 | Discharge: 2017-04-07 | Disposition: A | Discharge status: Home / Self Care | Payer: Medicare Other | Attending: Urgent Care | Admitting: Urgent Care

## 2017-04-07 ENCOUNTER — Encounter (HOSPITAL_COMMUNITY): Payer: Self-pay | Admitting: Emergency Medicine

## 2017-04-07 DIAGNOSIS — R6883 Chills (without fever): Secondary | ICD-10-CM

## 2017-04-07 DIAGNOSIS — R69 Illness, unspecified: Principal | ICD-10-CM

## 2017-04-07 DIAGNOSIS — J111 Influenza due to unidentified influenza virus with other respiratory manifestations: Secondary | ICD-10-CM

## 2017-04-07 DIAGNOSIS — R059 Cough, unspecified: Secondary | ICD-10-CM

## 2017-04-07 DIAGNOSIS — R52 Pain, unspecified: Secondary | ICD-10-CM

## 2017-04-07 DIAGNOSIS — R05 Cough: Secondary | ICD-10-CM

## 2017-04-07 DIAGNOSIS — R0981 Nasal congestion: Secondary | ICD-10-CM

## 2017-04-07 MED ORDER — OSELTAMIVIR PHOSPHATE 75 MG PO CAPS: 75.0000 mg | ORAL_CAPSULE | Freq: Two times a day (BID) | ORAL | 0 refills | Status: DC

## 2017-04-07 MED ORDER — PSEUDOEPHEDRINE HCL 30 MG PO TABS: 30.0000 mg | ORAL_TABLET | Freq: Three times a day (TID) | ORAL | 0 refills | Status: DC | PRN

## 2017-04-07 MED ORDER — BENZONATATE 100 MG PO CAPS: 100.0000 mg | ORAL_CAPSULE | Freq: Three times a day (TID) | ORAL | 0 refills | Status: DC | PRN

## 2017-04-07 NOTE — ED Provider Notes (Signed)
MRN: [id number] DOB: 06-11-1950  Subjective:   Aimee Parker is a 67 y.o. female presenting for 1 day history of sinus congestion, productive cough, body aches, subjective fever, chills. Cough elicits sore throat. Has tried otc medications with minimal relief. Denies sinus pain, ear pain, chest pain, shob, n/v, abdominal pain, rashes, dizziness, dysuria, hematuria. Denies smoking cigarettes. Has good control of diabetes. She did receive her flu shot in 12/2016.   No current facility-administered medications for this encounter.   Current Outpatient Medications:  .  ACCU-CHEK SOFTCLIX LANCETS lancets, USE AS INSTRUCTED, Disp: 100 each, Rfl: 2 .  amLODipine (NORVASC) 10 MG tablet, Take 1 tablet (10 mg total) by mouth daily., Disp: 90 tablet, Rfl: 3 .  Blood Glucose Monitoring Suppl (ACCU-CHEK AVIVA PLUS) w/Device KIT, 1 each by Does not apply route 3 (three) times daily., Disp: 1 kit, Rfl: 0 .  fenofibrate (TRICOR) 145 MG tablet, Take 1 tablet (145 mg total) by mouth daily., Disp: 90 tablet, Rfl: 3 .  glucose blood (ACCU-CHEK AVIVA) test strip, Use as instructed, Disp: 100 each, Rfl: 12 .  glucose blood (TRUE METRIX BLOOD GLUCOSE TEST) test strip, Use as instructed, Disp: 100 each, Rfl: 12 .  hydrochlorothiazide (HYDRODIURIL) 25 MG tablet, TAKE 1 TABLET (25 MG TOTAL) BY MOUTH DAILY., Disp: 90 tablet, Rfl: 0 .  insulin NPH-regular Human (HUMULIN 70/30) (70-30) 100 UNIT/ML injection, Inject 20 Units into the skin 2 (two) times daily with a meal., Disp: 10 mL, Rfl: 11 .  Insulin Syringe-Needle U-100 (BD INSULIN SYRINGE ULTRAFINE) 31G X 15/64" 0.3 ML MISC, USE TO INJECT INSULIN AS DIRECTED BY PHYSICIAN, Disp: 200 each, Rfl: 2 .  lisinopril (PRINIVIL,ZESTRIL) 40 MG tablet, Take 1 tablet (40 mg total) by mouth daily., Disp: 90 tablet, Rfl: 3 .  meclizine (ANTIVERT) 25 MG tablet, Take 1 tablet (25 mg total) by mouth 3 (three) times daily as needed for dizziness., Disp: 28 tablet, Rfl: 0 .  metFORMIN  (GLUCOPHAGE) 1000 MG tablet, Take 1 tablet (1,000 mg total) by mouth 2 (two) times daily with a meal., Disp: 180 tablet, Rfl: 3 .  methocarbamol (ROBAXIN) 500 MG tablet, Take 1 tablet (500 mg total) by mouth 4 (four) times daily. X 1 week then prn muscle pain, Disp: 90 tablet, Rfl: 0 .  naproxen (NAPROSYN) 500 MG tablet, Take 1 tablet (500 mg total) by mouth 2 (two) times daily with a meal. X 1 week then prn pain, Disp: 60 tablet, Rfl: 0 .  TRUEPLUS LANCETS 28G MISC, Test Blood Sugar 3 times daily, Disp: 100 each, Rfl: 12 .  valACYclovir (VALTREX) 500 MG tablet, Take 1 tablet (500 mg total) by mouth 2 (two) times daily., Disp: 14 tablet, Rfl: 0 .  Vitamin D, Ergocalciferol, (DRISDOL) 50000 units CAPS capsule, Take 1 capsule (50,000 Units total) by mouth every 7 (seven) days., Disp: 12 capsule, Rfl: 3   No Known Allergies  Past Medical History:  Diagnosis Date  . Hypertension     History reviewed. No pertinent surgical history.  Family History  Problem Relation Age of Onset  . Hypertension Mother   . Hypertension Father     Objective:   Vitals: BP (!) 147/51 (BP Location: Left Arm)   Pulse 71   Temp 98.3 F (36.8 C) (Oral)   Resp 18   SpO2 100%   BP Readings from Last 3 Encounters:  04/07/17 (!) 147/51  09/09/16 (!) 159/73  02/26/16 (!) 161/67   Physical Exam  Constitutional: She is oriented  to person, place, and time. She appears well-developed and well-nourished.  HENT:  Right Ear: Tympanic membrane normal.  Left Ear: Tympanic membrane normal.  Nose: No sinus tenderness.  Mouth/Throat: Oropharynx is clear and moist.  Eyes: Right eye exhibits no discharge. Left eye exhibits no discharge.  Neck: Normal range of motion. Neck supple.  Cardiovascular: Normal rate, regular rhythm and intact distal pulses. Exam reveals no gallop and no friction rub.  No murmur heard. Pulmonary/Chest: No respiratory distress. She has no wheezes. She has no rales.  Abdominal: Soft. Bowel sounds  are normal. She exhibits no distension and no mass. There is no tenderness. There is no guarding.  Musculoskeletal: She exhibits no edema.  Lymphadenopathy:    She has no cervical adenopathy.  Neurological: She is alert and oriented to person, place, and time.  Skin: Skin is warm and dry.  Psychiatric: She has a normal mood and affect.   Assessment and Plan :   Influenza-like illness  Cough  Chills  Body aches  Sinus congestion  Will manage as a flu like illness with Tamiflu and supportive care. Work note was provided for patient. Return-to-clinic precautions discussed, patient verbalized understanding.    Jaynee Eagles, PA-C 04/07/17 1505

## 2017-04-07 NOTE — Discharge Instructions (Addendum)
You may take 500mg  Tylenol with ibuprofen 400mg  every 6 hours for pain and inflammation. For sore throat try using a honey-based tea. Use 3 teaspoons of honey with juice squeezed from half lemon. Place shaved pieces of ginger into 1/2-1 cup of water and warm over stove top. Then mix the ingredients and repeat every 4 hours as needed. Hydrate well with at least 2 liters (1 gallon) of water daily.

## 2017-04-07 NOTE — ED Triage Notes (Signed)
Pt c/o cold sx 

## 2017-04-08 ENCOUNTER — Ambulatory Visit: Payer: Medicare Other | Attending: Internal Medicine | Admitting: Physician Assistant

## 2017-04-08 VITALS — BP 135/66 | HR 65 | Temp 98.2°F | Resp 18 | Ht 59.0 in | Wt 128.0 lb

## 2017-04-08 DIAGNOSIS — E119 Type 2 diabetes mellitus without complications: Secondary | ICD-10-CM | POA: Diagnosis not present

## 2017-04-08 DIAGNOSIS — Z760 Encounter for issue of repeat prescription: Secondary | ICD-10-CM | POA: Diagnosis not present

## 2017-04-08 DIAGNOSIS — E781 Pure hyperglyceridemia: Secondary | ICD-10-CM | POA: Insufficient documentation

## 2017-04-08 DIAGNOSIS — I10 Essential (primary) hypertension: Secondary | ICD-10-CM | POA: Diagnosis not present

## 2017-04-08 DIAGNOSIS — Z794 Long term (current) use of insulin: Secondary | ICD-10-CM | POA: Diagnosis not present

## 2017-04-08 DIAGNOSIS — E559 Vitamin D deficiency, unspecified: Secondary | ICD-10-CM | POA: Insufficient documentation

## 2017-04-08 DIAGNOSIS — Z79899 Other long term (current) drug therapy: Secondary | ICD-10-CM | POA: Insufficient documentation

## 2017-04-08 DIAGNOSIS — Z7984 Long term (current) use of oral hypoglycemic drugs: Secondary | ICD-10-CM | POA: Insufficient documentation

## 2017-04-08 LAB — POCT GLYCOSYLATED HEMOGLOBIN (HGB A1C): Hemoglobin A1C: 7.1

## 2017-04-08 LAB — GLUCOSE, POCT (MANUAL RESULT ENTRY): POC GLUCOSE: 225 mg/dL — AB (ref 70–99)

## 2017-04-08 MED ORDER — HYDROCHLOROTHIAZIDE 25 MG PO TABS: 25.0000 mg | ORAL_TABLET | Freq: Every day | ORAL | 3 refills | Status: DC

## 2017-04-08 MED ORDER — FENOFIBRATE 145 MG PO TABS: 145.0000 mg | ORAL_TABLET | Freq: Every day | ORAL | 3 refills | Status: DC

## 2017-04-08 MED ORDER — AMLODIPINE BESYLATE 10 MG PO TABS: 10.0000 mg | ORAL_TABLET | Freq: Every day | ORAL | 3 refills | Status: DC

## 2017-04-08 MED ORDER — METFORMIN HCL 1000 MG PO TABS: 1000.0000 mg | ORAL_TABLET | Freq: Two times a day (BID) | ORAL | 3 refills | Status: DC

## 2017-04-08 MED ORDER — LISINOPRIL 40 MG PO TABS: 40.0000 mg | ORAL_TABLET | Freq: Every day | ORAL | 3 refills | Status: DC

## 2017-04-08 MED FILL — HYDROCHLOROTHIAZIDE 25 MG T: 25 | 90 days supply | Qty: 90 | Fill #0

## 2017-04-08 MED FILL — metFORMIN HCL 1000 MG TABS: 1000 | 90 days supply | Qty: 180 | Fill #0

## 2017-04-08 MED FILL — FENOFIBRATE 145 MG TABLET: 145 | 90 days supply | Qty: 90 | Fill #0

## 2017-04-08 MED FILL — LISINOPRIL 40 MG TAB: 40 | 90 days supply | Qty: 90 | Fill #0

## 2017-04-08 NOTE — Progress Notes (Signed)
Patient ID: Aimee Parker, female   DOB: 1950-05-08, 67 y.o.   MRN: [id number]   Aimee Parker, is a 67 y.o. female  WUJ:[id number]  [id number]  DOB - November 30, 1950  Subjective:  Chief Complaint and HPI: Aimee Parker is a 67 y.o. female here today with no problems or complaints.  [employer] interpre[employer] is not working.  Medical assistant Francisco Capuchin is translating.  Patient is doing well without any complaints.  Needs RF of alll meds.  Not on insulin anymore.    ROS:   Constitutional:  No f/c, No night sweats, No unexplained weight loss. EENT:  No vision changes, No blurry vision, No hearing changes. No mouth, throat, or ear problems.  Respiratory: No cough, No SOB Cardiac: No CP, no palpitations GI:  No abd pain, No N/V/D. GU: No Urinary s/sx Musculoskeletal: No joint pain Neuro: No headache, no dizziness, no motor weakness.  Skin: No rash Endocrine:  No polydipsia. No polyuria.  Psych: Denies SI/HI  No problems updated.  ALLERGIES: No Known Allergies  PAST MEDICAL HISTORY: Past Medical History:  Diagnosis Date  . Hypertension     MEDICATIONS AT HOME: Prior to Admission medications   Medication Sig Start Date End Date Taking? Authorizing Provider  ACCU-CHEK SOFTCLIX LANCETS lancets USE AS INSTRUCTED 11/13/16  Yes Jegede, Olugbemiga E, MD  benzonatate (TESSALON) 100 MG capsule Take 1-2 capsules (100-200 mg total) by mouth 3 (three) times daily as needed. 04/07/17  Yes Jaynee Eagles, PA-C  Blood Glucose Monitoring Suppl (ACCU-CHEK AVIVA PLUS) w/Device KIT 1 each by Does not apply route 3 (three) times daily. 09/09/16  Yes Angelica Chessman E, MD  glucose blood (ACCU-CHEK AVIVA) test strip Use as instructed 09/09/16  Yes Jegede, Olugbemiga E, MD  glucose blood (TRUE METRIX BLOOD GLUCOSE TEST) test strip Use as instructed 09/09/16  Yes Jegede, Olugbemiga E, MD  meclizine (ANTIVERT) 25 MG tablet Take 1 tablet (25 mg total) by mouth 3 (three) times daily as needed for dizziness. 11/30/12  Yes  Mabe, Shanon Brow, NP  pseudoephedrine (SUDAFED) 30 MG tablet Take 1 tablet (30 mg total) by mouth every 8 (eight) hours as needed for congestion. 04/07/17  Yes Jaynee Eagles, PA-C  TRUEPLUS LANCETS 28G MISC Test Blood Sugar 3 times daily 09/09/16  Yes Jegede, Marlena Clipper, MD  Vitamin D, Ergocalciferol, (DRISDOL) 50000 units CAPS capsule Take 1 capsule (50,000 Units total) by mouth every 7 (seven) days. 09/18/16  Yes Jegede, Olugbemiga E, MD  amLODipine (NORVASC) 10 MG tablet Take 1 tablet (10 mg total) by mouth daily. 04/08/17   Argentina Donovan, PA-C  fenofibrate (TRICOR) 145 MG tablet Take 1 tablet (145 mg total) by mouth daily. 04/08/17   Argentina Donovan, PA-C  hydrochlorothiazide (HYDRODIURIL) 25 MG tablet Take 1 tablet (25 mg total) by mouth daily. 04/08/17   Argentina Donovan, PA-C  lisinopril (PRINIVIL,ZESTRIL) 40 MG tablet Take 1 tablet (40 mg total) by mouth daily. 04/08/17   Argentina Donovan, PA-C  metFORMIN (GLUCOPHAGE) 1000 MG tablet Take 1 tablet (1,000 mg total) by mouth 2 (two) times daily with a meal. 04/08/17   Argentina Donovan, PA-C     Objective:  EXAM:   Vitals:   04/08/17 0905  BP: 135/66  Pulse: 65  Resp: 18  Temp: 98.2 F (36.8 C)  TempSrc: Oral  SpO2: 100%  Weight: 128 lb (58.1 kg)  Height: '4\' 11"'$  (1.499 m)    General appearance : A&OX3. NAD. Non-toxic-appearing HEENT: Atraumatic and Normocephalic.  PERRLA. EOM  intact.  TM clear B. Mouth-MMM, post pharynx WNL w/o erythema, No PND. Neck: supple, no JVD. No cervical lymphadenopathy. No thyromegaly Chest/Lungs:  Breathing-non-labored, Good air entry bilaterally, breath sounds normal without rales, rhonchi, or wheezing  CVS: S1 S2 regular, no murmurs, gallops, rubs  Extremities: Bilateral Lower Ext shows no edema, both legs are warm to touch with = pulse throughout Neurology:  CN II-XII grossly intact, Non focal.   Psych:  TP linear. J/I WNL. Normal speech. Appropriate eye contact and affect.  Skin:  No Rash  Data  Review Lab Results  Component Value Date   HGBA1C 7.1 04/08/2017   HGBA1C 7.8 09/09/2016   HGBA1C 7.9 02/26/2016     Assessment & Plan   1. Type 2 diabetes mellitus without complication, with long-term current use of insulin (HCC) Almost at goal for age.  Continue current regimen and continue to work on diet.   - HgB A1c - Glucose (CBG) - Comprehensive metabolic panel - Lipid panel - metFORMIN (GLUCOPHAGE) 1000 MG tablet; Take 1 tablet (1,000 mg total) by mouth 2 (two) times daily with a meal.  Dispense: 180 tablet; Refill: 3  2. Hypertriglyceridemia - fenofibrate (TRICOR) 145 MG tablet; Take 1 tablet (145 mg total) by mouth daily.  Dispense: 90 tablet; Refill: 3  3. Essential hypertension, benign At goal-continue current regimen - amLODipine (NORVASC) 10 MG tablet; Take 1 tablet (10 mg total) by mouth daily.  Dispense: 90 tablet; Refill: 3 - hydrochlorothiazide (HYDRODIURIL) 25 MG tablet; Take 1 tablet (25 mg total) by mouth daily.  Dispense: 90 tablet; Refill: 3 - lisinopril (PRINIVIL,ZESTRIL) 40 MG tablet; Take 1 tablet (40 mg total) by mouth daily.  Dispense: 90 tablet; Refill: 3  4. Vitamin D deficiency - Vitamin D, 25-hydroxy  Patient have been counseled extensively about nutrition and exercise  Return in about 3 months (around 07/09/2017) for assign new PCP; f/up DM.  The patient was given clear instructions to go to ER or return to medical center if symptoms don't improve, worsen or new problems develop. The patient verbalized understanding. The patient was told to call to get lab results if they haven't heard anything in the next week.     Freeman Caldron, PA-C [facility] [place], [place]   04/08/2017, 9:11 AM

## 2017-04-08 NOTE — Patient Instructions (Signed)
Check blood sugars fasting and at bedtimes

## 2017-04-09 ENCOUNTER — Other Ambulatory Visit: Payer: Self-pay | Admitting: Physician Assistant

## 2017-04-09 LAB — COMPREHENSIVE METABOLIC PANEL
ALK PHOS: 70 IU/L (ref 39–117)
ALT: 21 IU/L (ref 0–32)
AST: 23 IU/L (ref 0–40)
Albumin/Globulin Ratio: 1.4 (ref 1.2–2.2)
Albumin: 4.6 g/dL (ref 3.6–4.8)
BILIRUBIN TOTAL: 0.2 mg/dL (ref 0.0–1.2)
BUN/Creatinine Ratio: 28 (ref 12–28)
BUN: 21 mg/dL (ref 8–27)
CHLORIDE: 100 mmol/L (ref 96–106)
CO2: 26 mmol/L (ref 20–29)
Calcium: 10 mg/dL (ref 8.7–10.3)
Creatinine, Ser: 0.74 mg/dL (ref 0.57–1.00)
GFR calc Af Amer: 97 mL/min/{1.73_m2} (ref >59)
GFR calc non Af Amer: 84 mL/min/{1.73_m2} (ref >59)
GLUCOSE: 199 mg/dL — AB (ref 65–99)
Globulin, Total: 3.3 g/dL (ref 1.5–4.5)
Potassium: 4.5 mmol/L (ref 3.5–5.2)
Sodium: 142 mmol/L (ref 134–144)
TOTAL PROTEIN: 7.9 g/dL (ref 6.0–8.5)

## 2017-04-09 LAB — LIPID PANEL
CHOLESTEROL TOTAL: 260 mg/dL — AB (ref 100–199)
Chol/HDL Ratio: 4.3 ratio (ref 0.0–4.4)
HDL: 60 mg/dL (ref >39)
LDL Calculated: 143 mg/dL — ABNORMAL HIGH (ref 0–99)
TRIGLYCERIDES: 287 mg/dL — AB (ref 0–149)
VLDL CHOLESTEROL CAL: 57 mg/dL — AB (ref 5–40)

## 2017-04-09 LAB — VITAMIN D 25 HYDROXY (VIT D DEFICIENCY, FRACTURES): Vit D, 25-Hydroxy: 17.1 ng/mL — ABNORMAL LOW (ref 30.0–100.0)

## 2017-04-09 MED ORDER — PRAVASTATIN SODIUM 20 MG PO TABS: 20.0000 mg | ORAL_TABLET | Freq: Every day | ORAL | 3 refills | Status: DC

## 2017-04-12 NOTE — Progress Notes (Signed)
Please share the results with the patient.

## 2017-04-13 ENCOUNTER — Telehealth: Payer: Self-pay

## 2017-04-13 NOTE — Telephone Encounter (Signed)
-----   Message from Margaretmary LombardNubia K [place], [place] sent at 04/12/2017  3:23 PM EDT ----- Please share the results with the patient.

## 2017-04-13 NOTE — Telephone Encounter (Signed)
CMA attempt to call patient.  No answer and unable to leave a VM due to no mailbox set up.  Letter will be send out to patient.  Patient need a [place] interpreter if she call:  Please inform:  Please call patient. Stop tricor(fenofibrate). I am starting her on a different cholesterol medication. Her cholesterol and triglycerides are very high. Tell her to eat a low fat/low cholesterol diet. Her other labs look ok. Follow-up as planned. Thanks, Georgian CoAngela McClung, PA-C

## 2017-05-13 ENCOUNTER — Ambulatory Visit (HOSPITAL_COMMUNITY): Payer: Medicare Other | Admitting: Certified Registered Nurse Anesthetist

## 2017-05-13 ENCOUNTER — Other Ambulatory Visit: Payer: Self-pay | Admitting: Ophthalmology

## 2017-05-13 ENCOUNTER — Encounter (HOSPITAL_COMMUNITY): Admission: RE | Disposition: A | Discharge status: Home / Self Care | Payer: Self-pay | Attending: Ophthalmology

## 2017-05-13 ENCOUNTER — Ambulatory Visit (HOSPITAL_COMMUNITY)
Admission: RE | Admit: 2017-05-13 | Discharge: 2017-05-13 | Disposition: A | Discharge status: Home / Self Care | Payer: Medicare Other | Attending: Ophthalmology | Admitting: Ophthalmology

## 2017-05-13 ENCOUNTER — Encounter (HOSPITAL_COMMUNITY): Payer: Self-pay | Admitting: Ophthalmology

## 2017-05-13 DIAGNOSIS — H59091 Other disorders of the right eye following cataract surgery: Secondary | ICD-10-CM | POA: Insufficient documentation

## 2017-05-13 DIAGNOSIS — Z9841 Cataract extraction status, right eye: Secondary | ICD-10-CM | POA: Insufficient documentation

## 2017-05-13 DIAGNOSIS — Y848 Other medical procedures as the cause of abnormal reaction of the patient, or of later complication, without mention of misadventure at the time of the procedure: Secondary | ICD-10-CM | POA: Insufficient documentation

## 2017-05-13 DIAGNOSIS — I447 Left bundle-branch block, unspecified: Secondary | ICD-10-CM | POA: Insufficient documentation

## 2017-05-13 DIAGNOSIS — I10 Essential (primary) hypertension: Secondary | ICD-10-CM | POA: Diagnosis not present

## 2017-05-13 DIAGNOSIS — Z7984 Long term (current) use of oral hypoglycemic drugs: Secondary | ICD-10-CM | POA: Diagnosis not present

## 2017-05-13 DIAGNOSIS — H44001 Unspecified purulent endophthalmitis, right eye: Secondary | ICD-10-CM | POA: Diagnosis present

## 2017-05-13 DIAGNOSIS — E119 Type 2 diabetes mellitus without complications: Secondary | ICD-10-CM | POA: Diagnosis not present

## 2017-05-13 DIAGNOSIS — H43311 Vitreous membranes and strands, right eye: Secondary | ICD-10-CM | POA: Diagnosis not present

## 2017-05-13 DIAGNOSIS — H44009 Unspecified purulent endophthalmitis, unspecified eye: Secondary | ICD-10-CM

## 2017-05-13 HISTORY — DX: Type 2 diabetes mellitus without complications: E11.9

## 2017-05-13 HISTORY — PX: PARS PLANA VITRECTOMY: SHX2166

## 2017-05-13 LAB — BASIC METABOLIC PANEL
Anion gap: 12 (ref 5–15)
BUN: 18 mg/dL (ref 6–20)
CO2: 27 mmol/L (ref 22–32)
CREATININE: 0.73 mg/dL (ref 0.44–1.00)
Calcium: 9.8 mg/dL (ref 8.9–10.3)
Chloride: 100 mmol/L — ABNORMAL LOW (ref 101–111)
GFR calc Af Amer: >60 mL/min (ref >60)
GLUCOSE: 101 mg/dL — AB (ref 65–99)
Potassium: 4.4 mmol/L (ref 3.5–5.1)
SODIUM: 139 mmol/L (ref 135–145)

## 2017-05-13 LAB — GLUCOSE, CAPILLARY
Glucose-Capillary: 110 mg/dL — ABNORMAL HIGH (ref 65–99)
Glucose-Capillary: 124 mg/dL — ABNORMAL HIGH (ref 65–99)

## 2017-05-13 LAB — GRAM STAIN

## 2017-05-13 LAB — HEMOGLOBIN: HEMOGLOBIN: 11.6 g/dL — AB (ref 12.0–15.0)

## 2017-05-13 SURGERY — PARS PLANA VITRECTOMY 25 GAUGE FOR ENDOPHTHALMITIS
Anesthesia: General | Site: Eye | Laterality: Right

## 2017-05-13 MED ORDER — BSS PLUS IO SOLN
INTRAOCULAR | Status: DC | PRN
  Administered 2017-05-13: 1 via INTRAOCULAR

## 2017-05-13 MED ORDER — FENTANYL CITRATE (PF) 100 MCG/2ML IJ SOLN
INTRAMUSCULAR | Status: AC
  Filled 2017-05-13: qty 2

## 2017-05-13 MED ORDER — EPINEPHRINE PF 1 MG/ML IJ SOLN
INTRAMUSCULAR | Status: DC | PRN
  Administered 2017-05-13: .3 mL

## 2017-05-13 MED ORDER — FENTANYL CITRATE (PF) 100 MCG/2ML IJ SOLN
25.0000 ug | INTRAMUSCULAR | Status: DC | PRN
  Administered 2017-05-13: 25 ug via INTRAVENOUS

## 2017-05-13 MED ORDER — ROCURONIUM BROMIDE 100 MG/10ML IV SOLN
INTRAVENOUS | Status: DC | PRN
  Administered 2017-05-13: 35 mg via INTRAVENOUS

## 2017-05-13 MED ORDER — PROPOFOL 10 MG/ML IV BOLUS
INTRAVENOUS | Status: AC
  Filled 2017-05-13: qty 20

## 2017-05-13 MED ORDER — MIDAZOLAM HCL 2 MG/2ML IJ SOLN
INTRAMUSCULAR | Status: DC | PRN
  Administered 2017-05-13: 1 mg via INTRAVENOUS

## 2017-05-13 MED ORDER — CYCLOPENTOLATE HCL 1 % OP SOLN
OPHTHALMIC | Status: AC
  Administered 2017-05-13: 1 [drp] via OPHTHALMIC
  Filled 2017-05-13: qty 2

## 2017-05-13 MED ORDER — PROPOFOL 10 MG/ML IV BOLUS
INTRAVENOUS | Status: DC | PRN
  Administered 2017-05-13: 120 mg via INTRAVENOUS

## 2017-05-13 MED ORDER — CEFTAZIDIME INTRAVITREAL INJECTION 2.25 MG/0.1 ML
INTRAVITREAL | Status: DC | PRN
  Administered 2017-05-13: .1 mg via INTRAVITREAL

## 2017-05-13 MED ORDER — LIDOCAINE 2% (20 MG/ML) 5 ML SYRINGE
INTRAMUSCULAR | Status: AC
  Filled 2017-05-13: qty 5

## 2017-05-13 MED ORDER — PHENYLEPHRINE HCL 2.5 % OP SOLN
OPHTHALMIC | Status: AC
  Administered 2017-05-13: 1 [drp] via OPHTHALMIC
  Filled 2017-05-13: qty 2

## 2017-05-13 MED ORDER — CEFTAZIDIME INTRAVITREAL INJECTION 2.25 MG/0.1 ML
2.2500 mg | INTRAVITREAL | Status: DC
  Filled 2017-05-13: qty 0.1

## 2017-05-13 MED ORDER — 0.9 % SODIUM CHLORIDE (POUR BTL) OPTIME
TOPICAL | Status: DC | PRN
  Administered 2017-05-13: 1000 mL

## 2017-05-13 MED ORDER — VANCOMYCIN INTRAVITREAL INJECTION 1 MG/0.1 ML
1.0000 mg | INTRAOCULAR | Status: DC
  Filled 2017-05-13: qty 0.1

## 2017-05-13 MED ORDER — SODIUM CHLORIDE 0.9 % IV SOLN
1.0000 g | Freq: Once | INTRAVENOUS | Status: AC
  Administered 2017-05-13: 1 g via INTRAVENOUS
  Filled 2017-05-13: qty 1

## 2017-05-13 MED ORDER — ONDANSETRON HCL 4 MG/2ML IJ SOLN
INTRAMUSCULAR | Status: AC
  Filled 2017-05-13: qty 2

## 2017-05-13 MED ORDER — VANCOMYCIN INTRAVITREAL INJECTION 1 MG/0.1 ML
INTRAOCULAR | Status: DC | PRN
  Administered 2017-05-13: 1 mg via INTRAVITREAL

## 2017-05-13 MED ORDER — NA CHONDROIT SULF-NA HYALURON 40-30 MG/ML IO SOLN
INTRAOCULAR | Status: DC | PRN
  Administered 2017-05-13: 0.5 mL via INTRAOCULAR

## 2017-05-13 MED ORDER — VANCOMYCIN HCL IN DEXTROSE 1-5 GM/200ML-% IV SOLN
1000.0000 mg | Freq: Once | INTRAVENOUS | Status: AC
  Administered 2017-05-13: 1000 mg via INTRAVENOUS
  Filled 2017-05-13 (×2): qty 200

## 2017-05-13 MED ORDER — SODIUM CHLORIDE 0.9 % IV SOLN
INTRAVENOUS | Status: DC
  Administered 2017-05-13: 19:00:00 via INTRAVENOUS

## 2017-05-13 MED ORDER — DEXAMETHASONE SODIUM PHOSPHATE 10 MG/ML IJ SOLN
INTRAMUSCULAR | Status: AC
  Filled 2017-05-13: qty 1

## 2017-05-13 MED ORDER — ONDANSETRON HCL 4 MG/2ML IJ SOLN
INTRAMUSCULAR | Status: DC | PRN
  Administered 2017-05-13: 4 mg via INTRAVENOUS

## 2017-05-13 MED ORDER — BSS IO SOLN
INTRAOCULAR | Status: DC | PRN
  Administered 2017-05-13: 30 mL via INTRAOCULAR

## 2017-05-13 MED ORDER — SUGAMMADEX SODIUM 200 MG/2ML IV SOLN
INTRAVENOUS | Status: DC | PRN
  Administered 2017-05-13: 113.4 mg via INTRAVENOUS

## 2017-05-13 MED ORDER — MIDAZOLAM HCL 2 MG/2ML IJ SOLN
INTRAMUSCULAR | Status: AC
  Filled 2017-05-13: qty 2

## 2017-05-13 MED ORDER — DEXAMETHASONE SODIUM PHOSPHATE 10 MG/ML IJ SOLN
INTRAMUSCULAR | Status: DC | PRN
  Administered 2017-05-13: 10 mg via INTRAVENOUS

## 2017-05-13 MED ORDER — LIDOCAINE HCL (CARDIAC) PF 100 MG/5ML IV SOSY
PREFILLED_SYRINGE | INTRAVENOUS | Status: DC | PRN
  Administered 2017-05-13: 30 mg via INTRAVENOUS

## 2017-05-13 MED ORDER — FENTANYL CITRATE (PF) 250 MCG/5ML IJ SOLN
INTRAMUSCULAR | Status: AC
  Filled 2017-05-13: qty 5

## 2017-05-13 MED ORDER — STERILE WATER FOR IRRIGATION IR SOLN
Status: DC | PRN
  Administered 2017-05-13: 1000 mL

## 2017-05-13 MED ORDER — PHENYLEPHRINE HCL 2.5 % OP SOLN
1.0000 [drp] | OPHTHALMIC | Status: AC | PRN
  Administered 2017-05-13 (×3): 1 [drp] via OPHTHALMIC

## 2017-05-13 MED ORDER — GATIFLOXACIN 0.5 % OP SOLN
1.0000 [drp] | OPHTHALMIC | Status: AC | PRN
  Administered 2017-05-13 (×3): 1 [drp] via OPHTHALMIC

## 2017-05-13 MED ORDER — CYCLOPENTOLATE HCL 1 % OP SOLN
1.0000 [drp] | OPHTHALMIC | Status: AC
  Administered 2017-05-13 (×3): 1 [drp] via OPHTHALMIC

## 2017-05-13 MED ORDER — FENTANYL CITRATE (PF) 250 MCG/5ML IJ SOLN
INTRAMUSCULAR | Status: DC | PRN
  Administered 2017-05-13: 50 ug via INTRAVENOUS

## 2017-05-13 MED ORDER — GATIFLOXACIN 0.5 % OP SOLN
OPHTHALMIC | Status: AC
  Administered 2017-05-13: 1 [drp] via OPHTHALMIC
  Filled 2017-05-13: qty 2.5

## 2017-05-13 MED ORDER — HYPROMELLOSE (GONIOSCOPIC) 2.5 % OP SOLN
OPHTHALMIC | Status: DC | PRN
  Administered 2017-05-13: 1 [drp] via OPHTHALMIC

## 2017-05-13 MED ORDER — ONDANSETRON HCL 4 MG/2ML IJ SOLN: 4.0000 mg | Freq: Once | INTRAMUSCULAR | Status: DC | PRN

## 2017-05-13 SURGICAL SUPPLY — 42 items
APL SRG 3 HI ABS STRL LF PLS (MISCELLANEOUS)
APPLICATOR COTTON TIP 6IN STRL (MISCELLANEOUS) ×13 IMPLANT
APPLICATOR DR MATTHEWS STRL (MISCELLANEOUS) IMPLANT
BLADE 10 SAFETY STRL DISP (BLADE) ×3 IMPLANT
CANNULA ANT CHAM MAIN (OPHTHALMIC RELATED) IMPLANT
CANNULA FLEX TIP 25G (CANNULA) IMPLANT
CANNULA VLV SOFT TIP 25G (OPHTHALMIC) ×1 IMPLANT
CANNULA VLV SOFT TIP 25GA (OPHTHALMIC) ×3 IMPLANT
CORDS BIPOLAR (ELECTRODE) ×2 IMPLANT
COVER MAYO STAND STRL (DRAPES) ×7 IMPLANT
DRAPE OPHTHALMIC 77X100 STRL (CUSTOM PROCEDURE TRAY) ×3 IMPLANT
FILTER BLUE MILLIPORE (MISCELLANEOUS) IMPLANT
FILTER STRAW FLUID ASPIR (MISCELLANEOUS) IMPLANT
FORCEPS ECKARDT ILM 25G SERR (OPHTHALMIC RELATED) ×3 IMPLANT
GLOVE SS BIOGEL STRL SZ 8.5 (GLOVE) ×1 IMPLANT
GLOVE SUPERSENSE BIOGEL SZ 8.5 (GLOVE) ×2
GOWN STRL REUS W/ TWL LRG LVL3 (GOWN DISPOSABLE) ×1 IMPLANT
GOWN STRL REUS W/TWL LRG LVL3 (GOWN DISPOSABLE) ×3
KIT BASIN OR (CUSTOM PROCEDURE TRAY) ×3 IMPLANT
KIT TURNOVER KIT B (KITS) ×3 IMPLANT
LENS BIOM SUPER VIEW SET DISP (OPHTHALMIC RELATED) ×5 IMPLANT
NDL 18GX1X1/2 (RX/OR ONLY) (NEEDLE) ×1 IMPLANT
NDL 25GX 5/8IN NON SAFETY (NEEDLE) ×1 IMPLANT
NDL HYPO 30X.5 LL (NEEDLE) ×2 IMPLANT
NEEDLE 18GX1X1/2 (RX/OR ONLY) (NEEDLE) ×3 IMPLANT
NEEDLE 25GX 5/8IN NON SAFETY (NEEDLE) ×3 IMPLANT
NEEDLE HYPO 30X.5 LL (NEEDLE) ×6 IMPLANT
PACK VITRECTOMY CUSTOM (CUSTOM PROCEDURE TRAY) ×3 IMPLANT
PAD ARMBOARD 7.5X6 YLW CONV (MISCELLANEOUS) ×6 IMPLANT
PAK PIK VITRECTOMY CVS 25GA (OPHTHALMIC) ×3 IMPLANT
PENCIL BIPOLAR 25GA STR DISP (OPHTHALMIC RELATED) IMPLANT
PROBE LASER ILLUM FLEX CVD 25G (OPHTHALMIC) ×2 IMPLANT
RESERVOIR BACK FLUSH (MISCELLANEOUS) IMPLANT
ROLLS DENTAL (MISCELLANEOUS) IMPLANT
STOCKINETTE IMPERVIOUS 9X36 MD (GAUZE/BANDAGES/DRESSINGS) ×6 IMPLANT
STOPCOCK 4 WAY LG BORE MALE ST (IV SETS) IMPLANT
SUT MERSILENE 5 0 RD 1 DA (SUTURE) IMPLANT
SUT VICRYL 7 0 TG140 8 (SUTURE) IMPLANT
SWAB COLLECTION DEVICE MRSA (MISCELLANEOUS) ×3 IMPLANT
SWAB CULTURE ESWAB REG 1ML (MISCELLANEOUS) ×3 IMPLANT
SYR TB 1ML LUER SLIP (SYRINGE) ×6 IMPLANT
TOWEL OR 17X24 6PK STRL BLUE (TOWEL DISPOSABLE) ×6 IMPLANT

## 2017-05-13 NOTE — H&P (Signed)
Woman with 5 days of vision loss, onset some 2-3 days after surgery and prior f/u with Dr. Laruth Bouchard.   Today with hand motion vision,, anterior chamber inflammation OD,,  Pupil fibrin membrane, and dense vitreous opacification and debris.  Imp.  Acute Endophthalmitis, OD, post recent Cataract surgery.    Plan:  To operating room tonight, for planned vitrectomy diagnostic and therapeutic,  Right eye with injection of intravitreal antibiotics,  Vancomycin and ceftazidime.,, release of pupil fibrin membrane.  Risks and benefits reviewed with patient via her son , interpreter.

## 2017-05-13 NOTE — Brief Op Note (Signed)
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  Anesthesia Gen. Endotracheal anesthesia

## 2017-05-13 NOTE — Anesthesia Postprocedure Evaluation (Signed)
Anesthesia Post Note  Patient: Aimee Parker  Procedure(s) Performed: PARS PLANA VITRECTOMY 25 GAUGE FOR ENDOPHTHALMITIS (Right Eye)     Patient location during evaluation: PACU Anesthesia Type: General Level of consciousness: awake and alert Pain management: pain level controlled Vital Signs Assessment: post-procedure vital signs reviewed and stable Respiratory status: spontaneous breathing, nonlabored ventilation, respiratory function stable and patient connected to nasal cannula oxygen Cardiovascular status: blood pressure returned to baseline and stable Postop Assessment: no apparent nausea or vomiting Anesthetic complications: no    Last Vitals:  Vitals:   05/13/17 2045 05/13/17 2100  BP: (!) 141/60 (!) 150/65  Pulse: 67 69  Resp: 16 17  Temp:  36.5 C  SpO2: 92% 97%    Last Pain:  Vitals:   05/13/17 2100  TempSrc:   PainSc: 3                  Cecile Hearing

## 2017-05-13 NOTE — Progress Notes (Signed)
PACU RN Geniyah Eischeid serves as interpreter.  Patient states that she is in no pain and is comfortable.  VSS.  Will continue to monitor

## 2017-05-13 NOTE — Anesthesia Procedure Notes (Signed)
Procedure Name: Intubation Date/Time: 05/13/2017 7:04 PM Performed by: Eligha Bridegroom, CRNA Pre-anesthesia Checklist: Patient identified, Emergency Drugs available, Suction available, Timeout performed and Patient being monitored Patient Re-evaluated:Patient Re-evaluated prior to induction Oxygen Delivery Method: Circle system utilized Preoxygenation: Pre-oxygenation with 100% oxygen Induction Type: IV induction Laryngoscope Size: Mac and 3 Grade View: Grade I Tube type: Oral Tube size: 7.0 mm Number of attempts: 1 Airway Equipment and Method: Stylet Placement Confirmation: ETT inserted through vocal cords under direct vision,  positive ETCO2 and breath sounds checked- equal and bilateral Secured at: 21 cm Tube secured with: Tape Dental Injury: Teeth and Oropharynx as per pre-operative assessment

## 2017-05-13 NOTE — Progress Notes (Signed)
PACU RN Toya Smothers serving as interpreter.  Patient reports 6/10 pain OD.  VSS.  of IV fentanyl administered.  Will continue to monitor.

## 2017-05-13 NOTE — Anesthesia Preprocedure Evaluation (Addendum)
Anesthesia Evaluation  Patient identified by MRN, date of birth, ID band Patient awake    Reviewed: Allergy & Precautions, NPO status , Patient's Chart, lab work & pertinent test results  Airway Mallampati: II  TM Distance: >3 FB Neck ROM: Full    Dental  (+) Teeth Intact, Dental Advisory Given, Missing,    Pulmonary neg pulmonary ROS,    Pulmonary exam normal breath sounds clear to auscultation       Cardiovascular hypertension, Pt. on medications Normal cardiovascular exam Rhythm:Regular Rate:Normal     Neuro/Psych negative neurological ROS  negative psych ROS   GI/Hepatic negative GI ROS, Neg liver ROS,   Endo/Other  diabetes, Type 2, Oral Hypoglycemic Agents  Renal/GU negative Renal ROS     Musculoskeletal negative musculoskeletal ROS (+)   Abdominal   Peds  Hematology negative hematology ROS (+)   Anesthesia Other Findings Day of surgery medications reviewed with the patient.  Reproductive/Obstetrics                            Anesthesia Physical Anesthesia Plan  ASA: II  Anesthesia Plan: General   Post-op Pain Management:    Induction: Intravenous  PONV Risk Score and Plan: 3 and Dexamethasone and Ondansetron  Airway Management Planned: Oral ETT  Additional Equipment:   Intra-op Plan:   Post-operative Plan: Extubation in OR  Informed Consent: I have reviewed the patients History and Physical, chart, labs and discussed the procedure including the risks, benefits and alternatives for the proposed anesthesia with the patient or authorized representative who has indicated his/her understanding and acceptance.   Dental advisory given  Plan Discussed with: CRNA and Anesthesiologist  Anesthesia Plan Comments: (Son-in-law translating for patient.)      Anesthesia Quick Evaluation

## 2017-05-13 NOTE — Progress Notes (Signed)
Discharge instruction reviewed with patient, son in law and daughter.  Son in Social worker served as Equities trader.  All questions answered.

## 2017-05-13 NOTE — Op Note (Signed)
Dictation [phone]

## 2017-05-13 NOTE — Transfer of Care (Signed)
Immediate Anesthesia Transfer of Care Note  Patient: Aimee Parker  Procedure(s) Performed: PARS PLANA VITRECTOMY 25 GAUGE FOR ENDOPHTHALMITIS (Right Eye)  Patient Location: PACU  Anesthesia Type:General  Level of Consciousness: awake and alert   Airway & Oxygen Therapy: Patient Spontanous Breathing and Patient connected to nasal cannula oxygen  Post-op Assessment: Report given to RN and Post -op Vital signs reviewed and stable  Post vital signs: Reviewed and stable  Last Vitals:  Vitals Value Taken Time  BP 160/74 05/13/2017  7:57 PM  Temp    Pulse 79 05/13/2017  7:59 PM  Resp 22 05/13/2017  7:59 PM  SpO2 93 % 05/13/2017  7:59 PM  Vitals shown include unvalidated device data.  Last Pain:  Vitals:   05/13/17 1821  TempSrc:   PainSc: 0-No pain         Complications: No apparent anesthesia complications

## 2017-05-14 ENCOUNTER — Encounter (HOSPITAL_COMMUNITY): Payer: Self-pay | Admitting: Ophthalmology

## 2017-05-14 NOTE — Op Note (Signed)
Aimee Parker, Aimee Parker [facility] MEDICAL RECORD [id number] ACCOUNT [id number] DATE OF BIRTH:04/09/1950 FACILITY: [facility] LOCATION: [facility] PHYSICIAN:Amina Menchaca Sabino Snipes, MD  OPERATIVE REPORT  DATE OF PROCEDURE:  05/13/2017  PREOPERATIVE DIAGNOSES: 1.  Acute endophthalmitis, status post recent cataract surgery with intraocular lens placement, some 7 days previous. 2.  Pupillary fibrous membrane, right eye. 3.  Vitreous opacification of the right eye.  POSTOPERATIVE DIAGNOSES: 1.  Acute endophthalmitis, status post recent cataract surgery with intraocular lens placement, some 7 days previous. 2.  Pupillary fibrous membrane, right eye. 3.  Vitreous opacification of the right eye.  PROCEDURE: 1.  Posterior vitrectomy with a diagnostic vitreous tap and vitreous washings, right eye. 2.  Removal of pupillary fibrin membrane via membrane forceps via the anterior chamber. 3.  Injection of intravitreal antibiotics, Fortaz 2.25 mg/0.1 mL, volume 0.1 mL and vancomycin 1.0 mg/0.1 mL concentration, volume 0.1 mL.  SURGEON:  Fawn Kirk, MD   ANESTHESIA:  General endotracheal anesthesia.  INDICATION FOR PROCEDURE:  The patient is a 67 year old woman who is some 8 days status post uncomplicated cataract surgery with immediate improvement in visual acuity from a dense nuclear sclerotic and mature cataract in the right eye.  The patient,  however, developed some 3 days later vision loss and was seen today in routine followup earlier this morning and was found to have an acute cellular reaction in the anterior chamber with fibrous membrane over the pupil and profound vision loss in the  right eye.  Further diagnostic evaluation including B scan ultrasonography confirmed dense vitreous opacifications in membranes as well as no signs of retinal tear or detachment of the right eye.  The patient and the family understand in [place]  language that this is an attempt to salvage the eye but also a chance to improve  visual acuity.  The patient understands the dire nature of this condition, and its outcome is dictated solely upon the type of infection that is incurred.  This will be  found out upon diagnostic vitrectomy with vitreous washings for culture and subsequently therapy delivered tonight.  The patient and the family understand the importance of surgical intervention on emergency basis.  DESCRIPTION OF PROCEDURE:  After appropriate consent was obtained, the patient was taken to the operating room.  In the operating room, appropriate monitoring was followed by mild sedation.  General anesthesia was instituted without difficulty.  The  right periocular region was identified.  Surgical timeout and thereafter surgically prepped and draped in the usual ophthalmic fashion.  Lid speculum was applied.  A 25-gauge trocar applied, and a paracentesis fashioned in the anterior chamber first.   This allowed for a low infusion pressure and balanced salt solution into the anterior chamber to maintain the anterior chamber formation while a second trocar was passed in a paracentesis fashion.  Through this, I used 25-gauge forceps to engage a  pupillary fibrin membrane that was preventing adequate pupillary dilation and preventing visualization posteriorly.  The pupillary fibrin membrane was engaged.  It was freed up from its pupil margin edges, and then using a twirling technique and  spaghetti roll, I was able to remove all the material from the anterior chamber and through the trocar.  The pupil responded nicely and enlarged nicely.  Now, visualization of the posterior capsule and the lens structures could be clearly identified.   One of the trocars was now removed from the anterior chamber.  A third trocar was now placed into the pars plana, and the infusion cannula was transferred  from the anterior chamber to the posterior chamber vitreous cavity without difficulty.  Typical  infusion pressures were now used.  All trocars  were removed from the anterior chamber.  Trocars were placed in the pars plana.  Core vitrectomy was then begun.  Notable findings were dense white vitreous opacifications as well as white inflammatory  debris overlying the retina.  The retina was attached.  The nerve was visualized.  Vessels were visualized.  There was some white cell margination of the vessels throughout the retinal periphery.  I did trim the vitreous space in a modest fashion, but I  cleared the visual axis nicely.  At this time, the trocars were removed from the pars plana, and so was the infusion.  Now with appropriate intraocular pressure, the aforementioned intravitreal antibiotics were then placed 0.1 mL of each into the  vitreous cavity and 0.2 mL of each remaining were placed in the subconjunctival region away from the trocar sites.  It must be noted that vitreous aspirate had been obtained prior to the vitrectomy through the trocar posterior setting, and this had been  plated directly into culture media.  Now, the vitreous washings cassette was then sent for also routine cultures.  No complications occurred.  A sterile patch and Fox shield were applied.  The patient was awakened from anesthesia and taken to the PACU in  good and stable condition.  She was also given Elita Quick IV 1 g and then also slow infusion of vancomycin 1 g over a 1-hour period.  The patient to be sent home as an outpatient later tonight.  LN/NUANCE  D:05/13/2017 T:05/13/2017 JOB:[id number]

## 2017-05-16 LAB — EYE CULTURE
CULTURE: NO GROWTH
Gram Stain: NONE SEEN

## 2017-05-18 LAB — AEROBIC/ANAEROBIC CULTURE (SURGICAL/DEEP WOUND)

## 2017-05-18 LAB — AEROBIC/ANAEROBIC CULTURE W GRAM STAIN (SURGICAL/DEEP WOUND): Culture: NO GROWTH

## 2017-07-09 ENCOUNTER — Ambulatory Visit: Payer: Medicare Other | Attending: Nurse Practitioner | Admitting: Nurse Practitioner

## 2017-07-09 ENCOUNTER — Encounter: Payer: Self-pay | Admitting: Nurse Practitioner

## 2017-07-09 VITALS — BP 146/70 | HR 62 | Temp 97.8°F | Ht 59.0 in | Wt 131.8 lb

## 2017-07-09 DIAGNOSIS — Z1231 Encounter for screening mammogram for malignant neoplasm of breast: Secondary | ICD-10-CM

## 2017-07-09 DIAGNOSIS — Z7984 Long term (current) use of oral hypoglycemic drugs: Secondary | ICD-10-CM | POA: Diagnosis not present

## 2017-07-09 DIAGNOSIS — Z23 Encounter for immunization: Secondary | ICD-10-CM | POA: Diagnosis not present

## 2017-07-09 DIAGNOSIS — E782 Mixed hyperlipidemia: Secondary | ICD-10-CM

## 2017-07-09 DIAGNOSIS — Z780 Asymptomatic menopausal state: Secondary | ICD-10-CM | POA: Diagnosis not present

## 2017-07-09 DIAGNOSIS — Z794 Long term (current) use of insulin: Secondary | ICD-10-CM

## 2017-07-09 DIAGNOSIS — E119 Type 2 diabetes mellitus without complications: Secondary | ICD-10-CM | POA: Insufficient documentation

## 2017-07-09 DIAGNOSIS — Z79899 Other long term (current) drug therapy: Secondary | ICD-10-CM | POA: Diagnosis not present

## 2017-07-09 DIAGNOSIS — I10 Essential (primary) hypertension: Secondary | ICD-10-CM

## 2017-07-09 LAB — GLUCOSE, POCT (MANUAL RESULT ENTRY): POC Glucose: 168 mg/dl — AB (ref 70–99)

## 2017-07-09 LAB — POCT GLYCOSYLATED HEMOGLOBIN (HGB A1C): Hemoglobin A1C: 7.7 % — AB (ref 4.0–5.6)

## 2017-07-09 MED ORDER — ACCU-CHEK AVIVA PLUS W/DEVICE KIT: 1.0000 | PACK | Freq: Three times a day (TID) | 0 refills | Status: DC

## 2017-07-09 MED ORDER — FENOFIBRATE 145 MG PO TABS: 145.0000 mg | ORAL_TABLET | Freq: Every day | ORAL | 1 refills | Status: DC

## 2017-07-09 MED ORDER — PNEUMOCOCCAL 13-VAL CONJ VACC IM SUSP: 0.5000 mL | INTRAMUSCULAR | 0 refills | Status: AC

## 2017-07-09 MED ORDER — PRAVASTATIN SODIUM 20 MG PO TABS: 20.0000 mg | ORAL_TABLET | Freq: Every day | ORAL | 3 refills | Status: DC

## 2017-07-09 MED FILL — metFORMIN HCL 1000 MG TABS: 1000 | 90 days supply | Qty: 180 | Fill #1

## 2017-07-09 MED FILL — PREVNAR 13 SYRINGE: 1 days supply | Qty: 1 | Fill #0

## 2017-07-09 MED FILL — LISINOPRIL 40 MG TABLET: 40 | 90 days supply | Qty: 90 | Fill #1

## 2017-07-09 MED FILL — ACCU-CHEK AVIVA PLUS W/DEVI: W/DEVICE | 30 days supply | Qty: 1 | Fill #0

## 2017-07-09 MED FILL — AMLODIPINE BESYLATE 10 MG T: 10 | 90 days supply | Qty: 90 | Fill #0

## 2017-07-09 MED FILL — HYDROCHLOROTHIAZIDE 25 MG T: 25 | 90 days supply | Qty: 90 | Fill #1

## 2017-07-09 MED FILL — PRAVASTATIN SODIUM 20 MG TA: 20 | 90 days supply | Qty: 90 | Fill #0

## 2017-07-09 MED FILL — FENOFIBRATE 145 MG TABLET: 145 | 90 days supply | Qty: 90 | Fill #1

## 2017-07-09 NOTE — Progress Notes (Signed)
Assessment & Plan:  Aimee Parker was seen today for establish care.  Diagnoses and all orders for this visit:  Type 2 diabetes mellitus without complication, with long-term current use of insulin (HCC) -     Glucose (CBG) -     HgB A1c -     Blood Glucose Monitoring Suppl (ACCU-CHEK AVIVA PLUS) w/Device KIT; 1 each by Does not apply route 3 (three) times daily. METER STOPPED WORKING Continue blood sugar control as discussed in office today, low carbohydrate diet, and regular physical exercise as tolerated, 150 minutes per week (30 min each day, 5 days per week, or 50 min 3 days per week). Keep blood sugar logs with fasting goal of 80-130 mg/dl, post prandial less than 180.  For Hypoglycemia: BS <60 and Hyperglycemia BS >400; contact the clinic ASAP. Annual eye exams and foot exams are recommended.   Mixed hyperlipidemia -     Lipid panel -     fenofibrate (TRICOR) 145 MG tablet; Take 1 tablet (145 mg total) by mouth daily. INSTRUCTIONS: Work on a low fat, heart healthy diet and participate in regular aerobic exercise program by working out at least 150 minutes per week. No fried foods. No junk foods, sodas, sugary drinks, unhealthy snacking, alcohol or smoking.     Essential hypertension Continue all antihypertensives as prescribed.  Remember to bring in your blood pressure log with you for your follow up appointment.  DASH/Mediterranean Diets are healthier choices for HTN.    Breast cancer screening by mammogram -     MM 3D SCREEN BREAST BILATERAL; Future  Postmenopausal estrogen deficiency -     DG Bone Density; Future  Encounter for administration of vaccine -     pneumococcal 13-valent conjugate vaccine (PREVNAR 13) SUSP injection; Inject 0.5 mLs into the muscle tomorrow at 10 am for 1 dose.  Patient has been counseled on age-appropriate routine health concerns for screening and prevention. These are reviewed and up-to-date. Referrals have been placed accordingly. Immunizations are  up-to-date or declined.    Subjective:   Chief Complaint  Patient presents with  . Establish Care    Pt. is here to establish care for diabetes and hypertension.    HPI Aimee Parker 67 y.o. female presents to office today to establish care for DM and HTN. VRI was used to communicate directly with patient for the entire encounter including providing detailed patient instructions.   CHRONIC HYPERTENSION Disease Monitoring  Blood pressure range: She does not monitor her blood pressure at home.   Chest pain: no   Dyspnea: no   Claudication: no  Medication compliance: yes  Medication Side Effects  Lightheadedness: no   Urinary frequency: no   Edema: no   Impotence: no  Preventitive Healthcare:  Exercise: no   Diet Pattern: salt not added to cooking  Salt Restriction:  yes  Denies chest pain, shortness of breath, palpitations, lightheadedness, dizziness, headaches or BLE edema.  BP Readings from Last 3 Encounters:  07/09/17 (!) 146/70  05/13/17 (!) 150/65  04/08/17 135/66    Type 2 Diabetes Mellitus Disease course has been slightly worsening from 7.1 to 7.7. She has not been able to check her blood sugars as her meter stopped working last month. She denies any hypo or hyperglycemic. over here are no hypoglycemic symptoms. There are no hypoglycemic complications. Symptoms are stable. There are no diabetic complications. Risk factors for coronary artery disease include family history, dyslipidemia, diabetes mellitus, hypertension. Current diabetic treatment includes metformin 1000  mg BID. Patient is compliant with treatment all of the time and monitors blood glucose at home 1 time per day.   Home blood glucose trend : (FBS 100-173m/dl)  Weight is  stable. Patient follows a generally healthy diet. Meal planning includes avoidance of concentrated sweets. Patient has not seen a dietician. Patient is compliant with exercise.   An ACE inhibitor/angiotensin II receptor blocker is being taken.  Patient does not see a podiatrist. Eye exam is not current.  Lab Results  Component Value Date   HGBA1C 7.7 (A) 07/09/2017   Lab Results  Component Value Date   HGBA1C 7.1 04/08/2017   Hyperlipidemia Patient presents for follow up to hyperlipidemia.  She is medication compliant taking gemfibrozil only. She was taking lopid in the past and was recently prescribed pravastatin. Will check lipids today and decide on statin vs restarting lopid. She is diet compliant and denies skin xanthelasma or statin intolerance including myalgias.  Lab Results  Component Value Date   CHOL 260 (H) 04/08/2017   Lab Results  Component Value Date   HDL 60 04/08/2017   Lab Results  Component Value Date   LDLCALC 143 (H) 04/08/2017   Lab Results  Component Value Date   TRIG 287 (H) 04/08/2017   Lab Results  Component Value Date   CHOLHDL 4.3 04/08/2017    Review of Systems  Constitutional: Negative for fever, malaise/fatigue and weight loss.  HENT: Negative.  Negative for nosebleeds.   Eyes: Negative.  Negative for blurred vision, double vision and photophobia.  Respiratory: Negative.  Negative for cough and shortness of breath.   Cardiovascular: Negative.  Negative for chest pain, palpitations and leg swelling.  Gastrointestinal: Negative.  Negative for heartburn, nausea and vomiting.  Musculoskeletal: Negative.  Negative for myalgias.  Neurological: Negative.  Negative for dizziness, focal weakness, seizures and headaches.  Psychiatric/Behavioral: Negative.  Negative for suicidal ideas.    Past Medical History:  Diagnosis Date  . [place] mellitus [place]   . Hypertension     Past Surgical History:  Procedure Laterality Date  . EYE SURGERY     Left eye surgery  . PARS PLANA VITRECTOMY Right 05/13/2017   Procedure: PARS PLANA VITRECTOMY 25 GAUGE FOR ENDOPHTHALMITIS;  Surgeon: RHurman Horn MD;  [facility]  Service: Ophthalmology;  Laterality: Right;     Family History  Problem Relation Age of Onset  . Hypertension Mother   . Hypertension Father     Social History Reviewed with no changes to be made today.   Outpatient Medications Prior to Visit  Medication Sig Dispense Refill  . ACCU-CHEK SOFTCLIX LANCETS lancets USE AS INSTRUCTED 100 each 2  . amLODipine (NORVASC) 10 MG tablet Take 1 tablet (10 mg total) by mouth daily. 90 tablet 3  . glucose blood (ACCU-CHEK AVIVA) test strip Use as instructed 100 each 12  . hydrochlorothiazide (HYDRODIURIL) 25 MG tablet Take 1 tablet (25 mg total) by mouth daily. 90 tablet 3  . lisinopril (PRINIVIL,ZESTRIL) 40 MG tablet Take 1 tablet (40 mg total) by mouth daily. 90 tablet 3  . metFORMIN (GLUCOPHAGE) 1000 MG tablet Take 1 tablet (1,000 mg total) by mouth 2 (two) times daily with a meal. 180 tablet 3  . TRUEPLUS LANCETS 28G MISC Test Blood Sugar 3 times daily 100 each 12  . Blood Glucose Monitoring Suppl (ACCU-CHEK AVIVA PLUS) w/Device KIT 1 each by Does not apply route 3 (three) times daily. 1 kit 0  . glucose blood (TRUE  METRIX BLOOD GLUCOSE TEST) test strip Use as instructed 100 each 12  . pravastatin (PRAVACHOL) 20 MG tablet Take 1 tablet (20 mg total) by mouth daily. 90 tablet 3  . Vitamin D, Ergocalciferol, (DRISDOL) 50000 units CAPS capsule Take 1 capsule (50,000 Units total) by mouth every 7 (seven) days. (Patient not taking: Reported on 05/13/2017) 12 capsule 3  . benzonatate (TESSALON) 100 MG capsule Take 1-2 capsules (100-200 mg total) by mouth 3 (three) times daily as needed. (Patient not taking: Reported on 05/13/2017) 60 capsule 0  . meclizine (ANTIVERT) 25 MG tablet Take 1 tablet (25 mg total) by mouth 3 (three) times daily as needed for dizziness. (Patient not taking: Reported on 05/13/2017) 28 tablet 0  . pseudoephedrine (SUDAFED) 30 MG tablet Take 1 tablet (30 mg total) by mouth every 8 (eight) hours as needed for congestion. (Patient not taking: Reported on 05/13/2017) 30 tablet 0   No  facility-administered medications prior to visit.     No Known Allergies     Objective:    BP (!) 146/70 (BP Location: Left Arm, Patient Position: Sitting, Cuff Size: Normal)   Pulse 62   Temp 97.8 F (36.6 C) (Oral)   Ht 4' 11"  (1.499 m)   Wt 131 lb 12.8 oz (59.8 kg)   SpO2 98%   BMI 26.62 kg/m  Wt Readings from Last 3 Encounters:  07/09/17 131 lb 12.8 oz (59.8 kg)  05/13/17 125 lb (56.7 kg)  04/08/17 128 lb (58.1 kg)    Physical Exam  Constitutional: She is oriented to person, place, and time. She appears well-developed and well-nourished. She is cooperative.  HENT:  Head: Normocephalic and atraumatic.  Eyes: EOM are normal.  Neck: Normal range of motion.  Cardiovascular: Normal rate, regular rhythm and intact distal pulses. Exam reveals no gallop and no friction rub.  Murmur heard. Pulses:      Dorsalis pedis pulses are 2+ on the right side, and 2+ on the left side.       Posterior tibial pulses are 2+ on the right side, and 2+ on the left side.  Pulmonary/Chest: Effort normal and breath sounds normal. No tachypnea. No respiratory distress. She has no decreased breath sounds. She has no wheezes. She has no rhonchi. She has no rales. She exhibits no tenderness.  Abdominal: Soft. Bowel sounds are normal.  Musculoskeletal: Normal range of motion. She exhibits no edema.  Feet:  Right Foot:  Protective Sensation: 10 sites tested. 10 sites sensed.  Left Foot:  Protective Sensation: 10 sites tested. 10 sites sensed.  Neurological: She is alert and oriented to person, place, and time. Coordination normal.  Skin: Skin is warm and dry.  Psychiatric: She has a normal mood and affect. Her behavior is normal. Judgment and thought content normal.  Nursing note and vitals reviewed.     Patient has been counseled extensively about nutrition and exercise as well as the importance of adherence with medications and regular follow-up. The patient was given clear instructions to go to  ER or return to medical center if symptoms don't improve, worsen or new problems develop. The patient verbalized understanding.   Follow-up: Return in about 3 months (around 10/09/2017) for HTN/HPL/DM.   Gildardo Pounds, FNP-BC [facility] and [place], [place]   07/09/2017, 1:55 PM

## 2017-07-10 LAB — LIPID PANEL
CHOLESTEROL TOTAL: 240 mg/dL — AB (ref 100–199)
Chol/HDL Ratio: 3.8 ratio (ref 0.0–4.4)
HDL: 64 mg/dL (ref >39)
LDL Calculated: 130 mg/dL — ABNORMAL HIGH (ref 0–99)
TRIGLYCERIDES: 230 mg/dL — AB (ref 0–149)
VLDL Cholesterol Cal: 46 mg/dL — ABNORMAL HIGH (ref 5–40)

## 2017-07-11 ENCOUNTER — Other Ambulatory Visit: Payer: Self-pay | Admitting: Nurse Practitioner

## 2017-07-11 MED ORDER — PRAVASTATIN SODIUM 20 MG PO TABS: 20.0000 mg | ORAL_TABLET | Freq: Every day | ORAL | 1 refills | Status: DC

## 2017-07-16 ENCOUNTER — Telehealth: Payer: Self-pay

## 2017-07-16 NOTE — Telephone Encounter (Signed)
CMA spoke to patient to inform on lab results and Rx.  Pt. Understood. Pt stated she picked up her new Rx.

## 2017-07-16 NOTE — Telephone Encounter (Signed)
-----   Message from Claiborne RiggZelda W Fleming, NP sent at 07/11/2017  1:48 PM EDT ----- Please call patient: Tests show increased cholesterol/lipid levels. Will need to start on statin or cholesterol/lipid lowering medication. You are to continue to take your tricor or fenofibrate as prescribed. Prescription has been sent to the pharmacy. Patient should work on a low fat, heart healthy diet and participate in regular aerobic exercise program to control as well by working out at least 150 minutes per week. No fried foods. No junk foods, sodas, sugary drinks, unhealthy snacking, or smoking.

## 2017-10-11 ENCOUNTER — Encounter: Payer: Self-pay | Admitting: Nurse Practitioner

## 2017-10-11 ENCOUNTER — Ambulatory Visit: Payer: Medicare Other | Attending: Nurse Practitioner | Admitting: Nurse Practitioner

## 2017-10-11 VITALS — BP 149/74 | HR 64 | Temp 98.3°F | Ht 59.0 in | Wt 128.8 lb

## 2017-10-11 DIAGNOSIS — Z23 Encounter for immunization: Secondary | ICD-10-CM

## 2017-10-11 DIAGNOSIS — Z79899 Other long term (current) drug therapy: Secondary | ICD-10-CM | POA: Insufficient documentation

## 2017-10-11 DIAGNOSIS — Z8249 Family history of ischemic heart disease and other diseases of the circulatory system: Secondary | ICD-10-CM | POA: Insufficient documentation

## 2017-10-11 DIAGNOSIS — E782 Mixed hyperlipidemia: Secondary | ICD-10-CM | POA: Diagnosis not present

## 2017-10-11 DIAGNOSIS — Z7984 Long term (current) use of oral hypoglycemic drugs: Secondary | ICD-10-CM | POA: Insufficient documentation

## 2017-10-11 DIAGNOSIS — Z794 Long term (current) use of insulin: Secondary | ICD-10-CM

## 2017-10-11 DIAGNOSIS — E559 Vitamin D deficiency, unspecified: Secondary | ICD-10-CM | POA: Diagnosis not present

## 2017-10-11 DIAGNOSIS — E119 Type 2 diabetes mellitus without complications: Secondary | ICD-10-CM

## 2017-10-11 DIAGNOSIS — I10 Essential (primary) hypertension: Secondary | ICD-10-CM | POA: Diagnosis not present

## 2017-10-11 DIAGNOSIS — Z09 Encounter for follow-up examination after completed treatment for conditions other than malignant neoplasm: Secondary | ICD-10-CM | POA: Insufficient documentation

## 2017-10-11 LAB — POCT GLYCOSYLATED HEMOGLOBIN (HGB A1C): HEMOGLOBIN A1C: 7.9 % — AB (ref 4.0–5.6)

## 2017-10-11 LAB — GLUCOSE, POCT (MANUAL RESULT ENTRY): POC GLUCOSE: 213 mg/dL — AB (ref 70–99)

## 2017-10-11 MED ORDER — ACCU-CHEK SOFTCLIX LANCETS MISC: 2 refills | Status: DC

## 2017-10-11 MED ORDER — GLUCOSE BLOOD VI STRP: ORAL_STRIP | 12 refills | Status: DC

## 2017-10-11 MED ORDER — PRAVASTATIN SODIUM 40 MG PO TABS: 40.0000 mg | ORAL_TABLET | Freq: Every day | ORAL | 1 refills | Status: DC

## 2017-10-11 MED ORDER — SITAGLIPTIN PHOS-METFORMIN HCL 50-1000 MG PO TABS: 1.0000 | ORAL_TABLET | Freq: Two times a day (BID) | ORAL | 2 refills | Status: DC

## 2017-10-11 MED ORDER — HYDROCHLOROTHIAZIDE 25 MG PO TABS: 25.0000 mg | ORAL_TABLET | Freq: Every day | ORAL | 3 refills | Status: DC

## 2017-10-11 MED ORDER — LISINOPRIL 40 MG PO TABS: 40.0000 mg | ORAL_TABLET | Freq: Every day | ORAL | 3 refills | Status: DC

## 2017-10-11 MED ORDER — AMLODIPINE BESYLATE 10 MG PO TABS: 10.0000 mg | ORAL_TABLET | Freq: Every day | ORAL | 3 refills | Status: DC

## 2017-10-11 MED FILL — LISINOPRIL 40 MG TABLET: 40 | 90 days supply | Qty: 90 | Fill #0

## 2017-10-11 MED FILL — PRAVASTATIN NA 40 MG TAB: 40 | 90 days supply | Qty: 90 | Fill #0

## 2017-10-11 MED FILL — ACCU-CHEK SOFTCLIX LANCETS: 50 days supply | Qty: 100 | Fill #0

## 2017-10-11 MED FILL — metFORMIN HCL 1000 MG TABS: 1000 | 90 days supply | Qty: 180 | Fill #2

## 2017-10-11 MED FILL — AMLODIPINE BESYLATE 10 MG T: 10 | 90 days supply | Qty: 90 | Fill #0

## 2017-10-11 MED FILL — ACCU-CHEK AVIVA PLUS TEST S: 50 days supply | Qty: 100 | Fill #0

## 2017-10-11 MED FILL — HYDROCHLOROTHIAZIDE 25 MG T: 25 | 90 days supply | Qty: 90 | Fill #0

## 2017-10-11 NOTE — Progress Notes (Signed)
Assessment & Plan:  Aimee Parker was seen today for follow-up.  Diagnoses and all orders for this visit:  Type 2 diabetes mellitus without complication, with long-term current use of insulin (HCC) -     Glucose (CBG) -     HgB A1c -     sitaGLIPtin-metformin (JANUMET) 50-1000 MG tablet; Take 1 tablet by mouth 2 (two) times daily with a meal. -     CMP14+EGFR -     Lipid panel -     Microalbumin/Creatinine Ratio, Urine -     ACCU-CHEK SOFTCLIX LANCETS lancets; Use as instructed. Check blood glucose levels twice per day by fingerstick -     glucose blood (ACCU-CHEK AVIVA) test strip; Use as instructed. Check blood glucose levels twice per day by fingerstick Continue blood sugar control as discussed in office today, low carbohydrate diet, and regular physical exercise as tolerated, 150 minutes per week (30 min each day, 5 days per week, or 50 min 3 days per week). Keep blood sugar logs with fasting goal of 90-130 mg/dl, post prandial (after you eat) less than 180.  For Hypoglycemia: BS <60 and Hyperglycemia BS >400; contact the clinic ASAP. Annual eye exams and foot exams are recommended.   Mixed hyperlipidemia -     pravastatin (PRAVACHOL) 40 MG tablet; Take 1 tablet (40 mg total) by mouth daily. INSTRUCTIONS: Work on a low fat, heart healthy diet and participate in regular aerobic exercise program by working out at least 150 minutes per week; 5 days a week-30 minutes per day. Avoid red meat, fried foods. junk foods, sodas, sugary drinks, unhealthy snacking, alcohol and smoking.  Drink at least 48oz of water per day and monitor your carbohydrate intake daily.   Essential hypertension, benign -     hydrochlorothiazide (HYDRODIURIL) 25 MG tablet; Take 1 tablet (25 mg total) by mouth daily. -     lisinopril (PRINIVIL,ZESTRIL) 40 MG tablet; Take 1 tablet (40 mg total) by mouth daily. -     amLODipine (NORVASC) 10 MG tablet; Take 1 tablet (10 mg total) by mouth daily. Continue all antihypertensives as  prescribed.  Remember to bring in your blood pressure log with you for your follow up appointment.  DASH/Mediterranean Diets are healthier choices for HTN.    Vitamin D deficiency -     VITAMIN D 25 Hydroxy (Vit-D Deficiency, Fractures)     Patient has been counseled on age-appropriate routine health concerns for screening and prevention. These are reviewed and up-to-date. Referrals have been placed accordingly. Immunizations are up-to-date or declined.    Subjective:   Chief Complaint  Patient presents with  . Follow-up    Pt. is here for 3 months follow-up on diabetes and hypertension.    HPI Aimee Parker 67 y.o. female presents to office today for follow up to DM and HTN. VRI was used to communicate directly with patient for the entire encounter including providing detailed patient instructions.    Diabetes Mellitus Type 2 A1c has increased from 7.7 to 7.9.  Fasting Average 100-120s. She checks her blood glucose levels every morning. I have recommended she also check her blood glucose levels after a meal. Will switch metformin to janumet.  Lab Results  Component Value Date   HGBA1C 7.9 (A) 10/11/2017    CHRONIC HYPERTENSION Disease Monitoring  Blood pressure range. Readings are elevated. However patient states she monitors her blood pressure daily. SBP average 120-130s. I will not make any adjustments with her antihypertensives. I have instructed her  to bring her blood pressure monitor with her to her next visit.  BP Readings from Last 3 Encounters:  10/11/17 (!) 149/74  07/09/17 (!) 146/70  05/13/17 (!) 150/65   Chest pain: no   Dyspnea: no   Claudication: no  Medication compliance: yes, taking lisinopril 32m,  HCTZ 222mdaily and Norvasc 10 mg daily as prescribed. Medication Side Effects  Lightheadedness: no   Urinary frequency: no   Edema: no   Impotence: no  Preventitive Healthcare:  Exercise: yes, stays active   Diet Pattern: diet: general  Salt  Restriction:  yes   Hyperlipidemia Patient presents for follow up to hyperlipidemia.  She is medication compliant. Will switch to pravastatin 4036mnd dc tricor.  She is not diet compliant and denies chest pain, dyspnea, exertional chest pressure/discomfort, lower extremity edema, poor exercise tolerance and skin xanthelasma or statin intolerance including myalgias.  Lab Results  Component Value Date   CHOL 240 (H) 07/09/2017   Lab Results  Component Value Date   HDL 64 07/09/2017   Lab Results  Component Value Date   LDLCALC 130 (H) 07/09/2017   Lab Results  Component Value Date   TRIG 230 (H) 07/09/2017   Lab Results  Component Value Date   CHOLHDL 3.8 07/09/2017  Review of Systems  Constitutional: Negative for fever, malaise/fatigue and weight loss.  HENT: Negative.  Negative for nosebleeds.   Eyes: Negative.  Negative for blurred vision, double vision and photophobia.  Respiratory: Negative.  Negative for cough and shortness of breath.   Cardiovascular: Negative.  Negative for chest pain, palpitations and leg swelling.  Gastrointestinal: Negative.  Negative for heartburn, nausea and vomiting.  Musculoskeletal: Negative.  Negative for myalgias.  Neurological: Negative.  Negative for dizziness, focal weakness, seizures and headaches.  Psychiatric/Behavioral: Negative.  Negative for suicidal ideas.    Past Medical History:  Diagnosis Date  . [place] Hypertension     Past Surgical History:  Procedure Laterality Date  . EYE SURGERY     Left eye surgery  . PARS PLANA VITRECTOMY Right 05/13/2017   Procedure: PARS PLANA VITRECTOMY 25 GAUGE FOR ENDOPHTHALMITIS;  Surgeon: RanHurman HornD;  [facility];  Laterality: Right;    Family History  Problem Relation Age of Onset  . Hypertension Mother   . Hypertension Father     Social History Reviewed with no changes to be made today.   Outpatient  Medications Prior to Visit  Medication Sig Dispense Refill  . Blood Glucose Monitoring Suppl (ACCU-CHEK AVIVA PLUS) w/Device KIT 1 each by Does not apply route 3 (three) times daily. METER STOPPED WORKING 1 kit 0  . ACCU-CHEK SOFTCLIX LANCETS lancets USE AS INSTRUCTED 100 each 2  . amLODipine (NORVASC) 10 MG tablet Take 1 tablet (10 mg total) by mouth daily. 90 tablet 3  . fenofibrate (TRICOR) 145 MG tablet Take 1 tablet (145 mg total) by mouth daily. 90 tablet 1  . glucose blood (ACCU-CHEK AVIVA) test strip Use as instructed 100 each 12  . hydrochlorothiazide (HYDRODIURIL) 25 MG tablet Take 1 tablet (25 mg total) by mouth daily. 90 tablet 3  . lisinopril (PRINIVIL,ZESTRIL) 40 MG tablet Take 1 tablet (40 mg total) by mouth daily. 90 tablet 3  . metFORMIN (GLUCOPHAGE) 1000 MG tablet Take 1 tablet (1,000 mg total) by mouth 2 (two) times daily with a meal. 180 tablet 3  . TRUEPLUS LANCETS 28G MISC Test Blood Sugar 3  times daily 100 each 12  . Vitamin D, Ergocalciferol, (DRISDOL) 50000 units CAPS capsule Take 1 capsule (50,000 Units total) by mouth every 7 (seven) days. (Patient not taking: Reported on 05/13/2017) 12 capsule 3  . pravastatin (PRAVACHOL) 20 MG tablet Take 1 tablet (20 mg total) by mouth daily. 90 tablet 1   No facility-administered medications prior to visit.     No Known Allergies     Objective:    BP (!) 149/74 (BP Location: Left Arm, Patient Position: Sitting, Cuff Size: Normal)   Pulse 64   Temp 98.3 F (36.8 C) (Oral)   Ht 4' 11"  (1.499 m)   Wt 128 lb 12.8 oz (58.4 kg)   SpO2 97%   BMI 26.01 kg/m  Wt Readings from Last 3 Encounters:  10/11/17 128 lb 12.8 oz (58.4 kg)  07/09/17 131 lb 12.8 oz (59.8 kg)  05/13/17 125 lb (56.7 kg)    Physical Exam  Constitutional: She is oriented to person, place, and time. She appears well-developed and well-nourished. She is cooperative.  HENT:  Head: Normocephalic and atraumatic.  Eyes: EOM are normal.  Neck: Normal range of  motion.  Cardiovascular: Normal rate, regular rhythm and normal heart sounds. Exam reveals no gallop and no friction rub.  No murmur heard. Pulmonary/Chest: Effort normal and breath sounds normal. No tachypnea. No respiratory distress. She has no decreased breath sounds. She has no wheezes. She has no rhonchi. She has no rales. She exhibits no tenderness.  Abdominal: Bowel sounds are normal.  Musculoskeletal: Normal range of motion. She exhibits no edema.  Neurological: She is alert and oriented to person, place, and time. Coordination normal.  Skin: Skin is warm and dry.  Psychiatric: She has a normal mood and affect. Her behavior is normal. Judgment and thought content normal.  Nursing note and vitals reviewed.      Patient has been counseled extensively about nutrition and exercise as well as the importance of adherence with medications and regular follow-up. The patient was given clear instructions to go to ER or return to medical center if symptoms don't improve, worsen or new problems develop. The patient verbalized understanding.   Follow-up: Return in about 14 weeks (around 01/17/2018) for NEEDS APPT ON JANUARY 6th . THANKS.   Gildardo Pounds, FNP-BC [facility] and [place], [place]   10/11/2017, 10:10 AM

## 2017-10-12 LAB — CMP14+EGFR
A/G RATIO: 1.4 (ref 1.2–2.2)
ALBUMIN: 4.7 g/dL (ref 3.6–4.8)
ALT: 28 IU/L (ref 0–32)
AST: 34 IU/L (ref 0–40)
Alkaline Phosphatase: 52 IU/L (ref 39–117)
BILIRUBIN TOTAL: 0.3 mg/dL (ref 0.0–1.2)
BUN/Creatinine Ratio: 24 (ref 12–28)
BUN: 26 mg/dL (ref 8–27)
CHLORIDE: 103 mmol/L (ref 96–106)
CO2: 22 mmol/L (ref 20–29)
Calcium: 9.9 mg/dL (ref 8.7–10.3)
Creatinine, Ser: 1.07 mg/dL — ABNORMAL HIGH (ref 0.57–1.00)
GFR calc non Af Amer: 54 mL/min/{1.73_m2} — ABNORMAL LOW (ref >59)
GFR, EST AFRICAN AMERICAN: 62 mL/min/{1.73_m2} (ref >59)
GLOBULIN, TOTAL: 3.3 g/dL (ref 1.5–4.5)
Glucose: 155 mg/dL — ABNORMAL HIGH (ref 65–99)
POTASSIUM: 4.3 mmol/L (ref 3.5–5.2)
SODIUM: 142 mmol/L (ref 134–144)
TOTAL PROTEIN: 8 g/dL (ref 6.0–8.5)

## 2017-10-12 LAB — MICROALBUMIN / CREATININE URINE RATIO
CREATININE, UR: 50.6 mg/dL
MICROALBUM., U, RANDOM: 38.3 ug/mL
Microalb/Creat Ratio: 75.7 mg/g creat — ABNORMAL HIGH (ref 0.0–30.0)

## 2017-10-12 LAB — VITAMIN D 25 HYDROXY (VIT D DEFICIENCY, FRACTURES): VIT D 25 HYDROXY: 21.6 ng/mL — AB (ref 30.0–100.0)

## 2017-10-12 LAB — LIPID PANEL
Chol/HDL Ratio: 2.7 ratio (ref 0.0–4.4)
Cholesterol, Total: 194 mg/dL (ref 100–199)
HDL: 72 mg/dL (ref >39)
LDL Calculated: 105 mg/dL — ABNORMAL HIGH (ref 0–99)
Triglycerides: 85 mg/dL (ref 0–149)
VLDL Cholesterol Cal: 17 mg/dL (ref 5–40)

## 2017-10-13 ENCOUNTER — Telehealth: Payer: Self-pay

## 2017-10-13 NOTE — Telephone Encounter (Signed)
CMA attempt to reach patient to inform on results.  No answer and unable to leave VM due to no voicemail has been set up.  A letter will be send out to reach patient.

## 2017-10-13 NOTE — Telephone Encounter (Signed)
-----   Message from Claiborne Rigg, NP sent at 10/12/2017  8:48 AM EDT ----- Cholesterol levels look great. Will recheck in 3 months. Kidney function has slightly decreased. Make sure you are not eating high sodium foods and drinking plenty of water. At least 48 oz per day. Will recheck at next office visit.

## 2017-10-13 NOTE — Telephone Encounter (Signed)
-----   Message from Claiborne Rigg, NP sent at 10/12/2017 11:35 PM EDT ----- Urine shows that your diabetes is causing some damage to your kidneys. Make sure you take your lisinopril as prescribed as this helps to protect the kidneys. Vitamin D is still slightly low. You can take OTC vitamin D 1000 units daily to help replace your vitamin D

## 2018-01-17 ENCOUNTER — Other Ambulatory Visit: Payer: Self-pay

## 2018-01-17 ENCOUNTER — Ambulatory Visit: Payer: Medicare Other | Attending: Nurse Practitioner | Admitting: Nurse Practitioner

## 2018-01-17 ENCOUNTER — Encounter: Payer: Self-pay | Admitting: Nurse Practitioner

## 2018-01-17 VITALS — BP 132/71 | HR 65 | Temp 98.0°F | Ht 59.0 in | Wt 132.0 lb

## 2018-01-17 DIAGNOSIS — E782 Mixed hyperlipidemia: Secondary | ICD-10-CM

## 2018-01-17 DIAGNOSIS — Z794 Long term (current) use of insulin: Secondary | ICD-10-CM

## 2018-01-17 DIAGNOSIS — E119 Type 2 diabetes mellitus without complications: Secondary | ICD-10-CM | POA: Diagnosis not present

## 2018-01-17 DIAGNOSIS — E559 Vitamin D deficiency, unspecified: Secondary | ICD-10-CM | POA: Diagnosis not present

## 2018-01-17 DIAGNOSIS — I10 Essential (primary) hypertension: Secondary | ICD-10-CM | POA: Diagnosis not present

## 2018-01-17 LAB — POCT GLYCOSYLATED HEMOGLOBIN (HGB A1C): HbA1c, POC (controlled diabetic range): 8.3 % — AB (ref 0.0–7.0)

## 2018-01-17 LAB — GLUCOSE, POCT (MANUAL RESULT ENTRY): POC Glucose: 138 mg/dl — AB (ref 70–99)

## 2018-01-17 MED ORDER — SITAGLIPTIN PHOS-METFORMIN HCL 50-500 MG PO TABS: 1.0000 | ORAL_TABLET | Freq: Two times a day (BID) | ORAL | 1 refills | Status: DC

## 2018-01-17 MED ORDER — PRAVASTATIN SODIUM 40 MG PO TABS: 40.0000 mg | ORAL_TABLET | Freq: Every day | ORAL | 1 refills | Status: DC

## 2018-01-17 MED ORDER — HYDROCHLOROTHIAZIDE 25 MG PO TABS
25.0000 mg | ORAL_TABLET | Freq: Every day | ORAL | 3 refills | Status: DC
Start: 2018-01-17 — End: 2018-05-20

## 2018-01-17 MED ORDER — LISINOPRIL 40 MG PO TABS: 40.0000 mg | ORAL_TABLET | Freq: Every day | ORAL | 3 refills | Status: DC

## 2018-01-17 MED ORDER — VITAMIN D (ERGOCALCIFEROL) 1.25 MG (50000 UNIT) PO CAPS: 50000.0000 [IU] | ORAL_CAPSULE | ORAL | 3 refills | Status: DC

## 2018-01-17 MED ORDER — AMLODIPINE BESYLATE 10 MG PO TABS: 10.0000 mg | ORAL_TABLET | Freq: Every day | ORAL | 3 refills | Status: DC

## 2018-01-17 MED FILL — HYDROCHLOROTHIAZIDE 25 MG T: 25 | 90 days supply | Qty: 90 | Fill #0

## 2018-01-17 MED FILL — AMLODIPINE BESYLATE 10 MG T: 10 | 90 days supply | Qty: 90 | Fill #0

## 2018-01-17 MED FILL — VIT D2 1.25 MG (50,000 UNIT: 1.25 MG | 84 days supply | Qty: 12 | Fill #0

## 2018-01-17 MED FILL — LISINOPRIL 40 MG TABLET: 40 | 90 days supply | Qty: 90 | Fill #0

## 2018-01-17 MED FILL — PRAVASTATIN NA 40 MG TAB: 40 | 90 days supply | Qty: 90 | Fill #0

## 2018-01-17 MED FILL — JANUMET 50-500 MG TABLET: 50-500 | 30 days supply | Qty: 60 | Fill #0

## 2018-01-17 NOTE — Progress Notes (Signed)
Assessment & Plan:  Aimee Parker was seen today for diabetes.  Diagnoses and all orders for this visit:  Type 2 diabetes mellitus without complication, with long-term current use of insulin (HCC) -     Glucose (CBG) -     POCT glycosylated hemoglobin (Hb A1C) -     sitaGLIPtin-metformin (JANUMET) 50-500 MG tablet; Take 1 tablet by mouth 2 (two) times daily with a meal. Continue blood sugar control as discussed in office today, low carbohydrate diet, and regular physical exercise as tolerated, 150 minutes per week (30 min each day, 5 days per week, or 50 min 3 days per week). Keep blood sugar logs with fasting goal of 90-130 mg/dl, post prandial (after you eat) less than 180.  For Hypoglycemia: BS <60 and Hyperglycemia BS >400; contact the clinic ASAP. Annual eye exams and foot exams are recommended.   Mixed hyperlipidemia -     pravastatin (PRAVACHOL) 40 MG tablet; Take 1 tablet (40 mg total) by mouth daily. INSTRUCTIONS: Work on a low fat, heart healthy diet and participate in regular aerobic exercise program by working out at least 150 minutes per week; 5 days a week-30 minutes per day. Avoid red meat, fried foods. junk foods, sodas, sugary drinks, unhealthy snacking, alcohol and smoking.  Drink at least 48oz of water per day and monitor your carbohydrate intake daily.    Essential hypertension, benign -     lisinopril (PRINIVIL,ZESTRIL) 40 MG tablet; Take 1 tablet (40 mg total) by mouth daily. -     hydrochlorothiazide (HYDRODIURIL) 25 MG tablet; Take 1 tablet (25 mg total) by mouth daily. -     amLODipine (NORVASC) 10 MG tablet; Take 1 tablet (10 mg total) by mouth daily. -     CMP14+EGFR Continue all antihypertensives as prescribed.  Remember to bring in your blood pressure log with you for your follow up appointment.  DASH/Mediterranean Diets are healthier choices for HTN.    Vitamin D deficiency -     VITAMIN D 25 Hydroxy (Vit-D Deficiency, Fractures) -     Vitamin D, Ergocalciferol,  (DRISDOL) 1.25 MG (50000 UT) CAPS capsule; Take 1 capsule (50,000 Units total) by mouth every 7 (seven) days.    Patient has been counseled on age-appropriate routine health concerns for screening and prevention. These are reviewed and up-to-date. Referrals have been placed accordingly. Immunizations are up-to-date or declined.    Subjective:   Chief Complaint  Patient presents with  . Diabetes   HPI Aimee Parker 68 y.o. female presents to office today for follow up to DM TYPE 2. VRI was used to communicate directly with patient for the entire encounter including providing detailed patient instructions.   DM TYPE 2 Average FASTING readings 100-120s. She denies any hypo or hyperglycemic symptoms. A1c up from 7.9 to 8.3. She does endorse non compliance with diet over the past several weeks. I had started her on janumet several months ago however after med review today it appears she is still taking metformin 1000 mg BID. Due to increase in A1c will prescribe janumet 50-500 mg bid and dc metformin. She is overdue for eye exam. Will place referral today.  Lab Results  Component Value Date   HGBA1C 8.3 (A) 01/17/2018   Lab Results  Component Value Date   HGBA1C 7.9 (A) 10/11/2017    Essential Hypertension She is monitoring her blood pressure at home with average readings 140/60s. Blood pressure is well controlled today. She endorses medication compliance taking hydrochlorothiazide 25 mg,  amlodipine 10 mg, lisinopril 40 mg daily. Denies chest pain, shortness of breath, palpitations, lightheadedness, dizziness, headaches or BLE edema.  BP Readings from Last 3 Encounters:  01/17/18 132/71  10/11/17 (!) 149/74  07/09/17 (!) 146/70    Hyperlipidemia Patient presents for follow up to hyperlipidemia.  She is medication compliant taking pravastatin 40 mg daily. She is diet compliant and denies statin intolerance including myalgias.  LDL not at goal. Lab Results  Component Value Date   CHOL  194 10/11/2017   Lab Results  Component Value Date   HDL 72 10/11/2017   Lab Results  Component Value Date   LDLCALC 105 (H) 10/11/2017   Lab Results  Component Value Date   TRIG 85 10/11/2017   Lab Results  Component Value Date   CHOLHDL 2.7 10/11/2017   No results found for: LDLDIRECT Review of Systems  Constitutional: Negative for fever, malaise/fatigue and weight loss.  HENT: Negative.  Negative for nosebleeds.   Eyes: Negative.  Negative for blurred vision, double vision and photophobia.  Respiratory: Negative.  Negative for cough and shortness of breath.   Cardiovascular: Negative.  Negative for chest pain, palpitations and leg swelling.  Gastrointestinal: Negative.  Negative for heartburn, nausea and vomiting.  Musculoskeletal: Negative.  Negative for myalgias.  Neurological: Negative.  Negative for dizziness, focal weakness, seizures and headaches.  Psychiatric/Behavioral: Negative.  Negative for suicidal ideas.    Past Medical History:  Diagnosis Date  . Diabetes mellitus without complication ([place])   . Hypertension     Past Surgical History:  Procedure Laterality Date  . EYE SURGERY     Left eye surgery  . PARS PLANA VITRECTOMY Right 05/13/2017   Procedure: PARS PLANA VITRECTOMY 25 GAUGE FOR ENDOPHTHALMITIS;  Surgeon: Hurman Horn, MD;  Location: [facility];  Service: Ophthalmology;  Laterality: Right;    Family History  Problem Relation Age of Onset  . Hypertension Mother   . Hypertension Father     Social History Reviewed with no changes to be made today.   Outpatient Medications Prior to Visit  Medication Sig Dispense Refill  . ACCU-CHEK SOFTCLIX LANCETS lancets Use as instructed. Check blood glucose levels twice per day by fingerstick 100 each 2  . Blood Glucose Monitoring Suppl (ACCU-CHEK AVIVA PLUS) w/Device KIT 1 each by Does not apply route 3 (three) times daily. METER STOPPED WORKING 1 kit 0  . glucose blood (ACCU-CHEK AVIVA) test strip Use as  instructed. Check blood glucose levels twice per day by fingerstick 100 each 12  . amLODipine (NORVASC) 10 MG tablet Take 1 tablet (10 mg total) by mouth daily. 90 tablet 3  . hydrochlorothiazide (HYDRODIURIL) 25 MG tablet Take 1 tablet (25 mg total) by mouth daily. 90 tablet 3  . lisinopril (PRINIVIL,ZESTRIL) 40 MG tablet Take 1 tablet (40 mg total) by mouth daily. 90 tablet 3  . pravastatin (PRAVACHOL) 40 MG tablet Take 1 tablet (40 mg total) by mouth daily. 90 tablet 1  . sitaGLIPtin-metformin (JANUMET) 50-1000 MG tablet Take 1 tablet by mouth 2 (two) times daily with a meal. 180 tablet 2  . Vitamin D, Ergocalciferol, (DRISDOL) 50000 units CAPS capsule Take 1 capsule (50,000 Units total) by mouth every 7 (seven) days. (Patient not taking: Reported on 05/13/2017) 12 capsule 3   No facility-administered medications prior to visit.      No Known Allergies     Objective:    BP 132/71   Pulse 65   Temp 98 F (36.7 C) (Oral)  Ht 4' 11"  (1.499 m)   Wt 132 lb (59.9 kg)   SpO2 98%   BMI 26.66 kg/m  Wt Readings from Last 3 Encounters:  01/17/18 132 lb (59.9 kg)  10/11/17 128 lb 12.8 oz (58.4 kg)  07/09/17 131 lb 12.8 oz (59.8 kg)    Physical Exam Vitals signs and nursing note reviewed.  Constitutional:      Appearance: She is well-developed.  HENT:     Head: Normocephalic and atraumatic.  Neck:     Musculoskeletal: Normal range of motion.  Cardiovascular:     Rate and Rhythm: Normal rate and regular rhythm.     Heart sounds: Normal heart sounds. No murmur. No friction rub. No gallop.   Pulmonary:     Effort: Pulmonary effort is normal. No tachypnea or respiratory distress.     Breath sounds: Normal breath sounds. No decreased breath sounds, wheezing, rhonchi or rales.  Chest:     Chest wall: No tenderness.  Abdominal:     General: Bowel sounds are normal.     Palpations: Abdomen is soft.  Musculoskeletal: Normal range of motion.  Skin:    General: Skin is warm and dry.    Neurological:     Mental Status: She is alert and oriented to person, place, and time.     Coordination: Coordination normal.  Psychiatric:        Behavior: Behavior normal. Behavior is cooperative.        Thought Content: Thought content normal.        Judgment: Judgment normal.        Patient has been counseled extensively about nutrition and exercise as well as the importance of adherence with medications and regular follow-up. The patient was given clear instructions to go to ER or return to medical center if symptoms don't improve, worsen or new problems develop. The patient verbalized understanding.   Follow-up:  Make 3-4 week appointment. With [facility] for DM check. Then make next appointment with me on  April 11, 2018  Gildardo Pounds, FNP-BC [facility] [place], [place]   01/17/2018, 12:01 PM

## 2018-01-18 ENCOUNTER — Other Ambulatory Visit: Payer: Self-pay | Admitting: Nurse Practitioner

## 2018-01-18 LAB — CMP14+EGFR
ALK PHOS: 72 IU/L (ref 39–117)
ALT: 37 IU/L — AB (ref 0–32)
AST: 46 IU/L — AB (ref 0–40)
Albumin/Globulin Ratio: 1.6 (ref 1.2–2.2)
Albumin: 4.9 g/dL — ABNORMAL HIGH (ref 3.6–4.8)
BUN/Creatinine Ratio: 19 (ref 12–28)
BUN: 16 mg/dL (ref 8–27)
Bilirubin Total: 0.3 mg/dL (ref 0.0–1.2)
CO2: 22 mmol/L (ref 20–29)
CREATININE: 0.85 mg/dL (ref 0.57–1.00)
Calcium: 10.3 mg/dL (ref 8.7–10.3)
Chloride: 99 mmol/L (ref 96–106)
GFR calc Af Amer: 82 mL/min/{1.73_m2} (ref >59)
GFR, EST NON AFRICAN AMERICAN: 71 mL/min/{1.73_m2} (ref >59)
GLUCOSE: 112 mg/dL — AB (ref 65–99)
Globulin, Total: 3.1 g/dL (ref 1.5–4.5)
Potassium: 4.5 mmol/L (ref 3.5–5.2)
SODIUM: 140 mmol/L (ref 134–144)
Total Protein: 8 g/dL (ref 6.0–8.5)

## 2018-01-18 LAB — VITAMIN D 25 HYDROXY (VIT D DEFICIENCY, FRACTURES): VIT D 25 HYDROXY: 16.3 ng/mL — AB (ref 30.0–100.0)

## 2018-01-25 ENCOUNTER — Encounter (INDEPENDENT_AMBULATORY_CARE_PROVIDER_SITE_OTHER): Payer: Self-pay

## 2018-02-07 ENCOUNTER — Encounter: Payer: Self-pay | Admitting: Pharmacist

## 2018-02-07 ENCOUNTER — Ambulatory Visit: Payer: Medicare Other | Attending: Family Medicine | Admitting: Pharmacist

## 2018-02-07 DIAGNOSIS — Z794 Long term (current) use of insulin: Secondary | ICD-10-CM | POA: Diagnosis not present

## 2018-02-07 DIAGNOSIS — E119 Type 2 diabetes mellitus without complications: Secondary | ICD-10-CM | POA: Insufficient documentation

## 2018-02-07 LAB — GLUCOSE, POCT (MANUAL RESULT ENTRY): POC Glucose: 137 mg/dl — AB (ref 70–99)

## 2018-02-07 MED ORDER — SITAGLIPTIN PHOS-METFORMIN HCL 50-1000 MG PO TABS: 1.0000 | ORAL_TABLET | Freq: Two times a day (BID) | ORAL | 0 refills | Status: DC

## 2018-02-07 MED ORDER — ATORVASTATIN CALCIUM 40 MG PO TABS: 40.0000 mg | ORAL_TABLET | Freq: Every day | ORAL | 0 refills | Status: DC

## 2018-02-07 MED FILL — JANUMET 50-1,000 MG TABLET: 50-1000 | 90 days supply | Qty: 180 | Fill #0

## 2018-02-07 MED FILL — ATORVASTATIN CALCIUM 40 MG: 40 | 90 days supply | Qty: 90 | Fill #0

## 2018-02-07 NOTE — Progress Notes (Signed)
S:    PCP: Zelda  No chief complaint on file.  Patient arrives in good spirits.  Presents for diabetes evaluation, education, and management at the request of Zelda. Patient was referred on 01/17/2018. At that visit, Zelda stopped single agent metformin and started Janumet 50-500 mg BID.  Family/Social History:  - HTN (Mother and Father) - Tobacco: never smoker - Alcohol: denies use  Insurance coverage/medication affordability:  - [employer]  Patient reports adherence with medications.  Current diabetes medications include: Janumet 50-500 mg BID  Patient denies hypoglycemic events.  Patient reported dietary habits:  - Eats 3 light meals a day - High intake of white rice  Patient-reported exercise habits:  - Does not exercise much   Patient denies nocturia.  Patient denies neuropathy. Patient denies visual changes. Patient reports self foot exams.  O:  POCT: 137 Home fasting CBG: 99 - 130s   Lab Results  Component Value Date   HGBA1C 8.3 (A) 01/17/2018   There were no vitals filed for this visit.  Lipid Panel     Component Value Date/Time   CHOL 194 10/11/2017 1014   TRIG 85 10/11/2017 1014   HDL 72 10/11/2017 1014   CHOLHDL 2.7 10/11/2017 1014   CHOLHDL 2.0 11/28/2015 1223   VLDL 15 11/28/2015 1223   LDLCALC 105 (H) 10/11/2017 1014   Clinical ASCVD: No  The 10-year ASCVD risk score Denman George(Goff DC Jr., et al., 2013) is: 16.4%   Values used to calculate the score:     Age: 68 years     Sex: Female     Is Non-Hispanic African American: No     Diabetic: Yes     Tobacco smoker: No     Systolic Blood Pressure: 132 mmHg     Is BP treated: Yes     HDL Cholesterol: 72 mg/dL     Total Cholesterol: 194 mg/dL   A/P: Diabetes longstanding currently uncontrolled. Goal A1c <7 - can make a case for a less stringent goal of < 7.5 given her age per ADA guidelines. Patient is able to verbalize appropriate hypoglycemia management plan. Patient is adherent with  medication.   Pt is not far from A1c goal. No symptoms of hyperglycemia. No side effects from current therapy. She still has a high degree of functionality and is independent with ADLs. Denies hypoglycemia or associated symptoms. I will increase her Janumet to 50-1000 mg BID in attempt to achieve A1c goal.  -Increased dose of Janumet to 50-1000 mg BID. -Extensively discussed pathophysiology of DM, recommended lifestyle interventions, dietary effects on glycemic control -Counseled on s/sx of and management of hypoglycemia -HM: due for tetanus vaccine - none in clinic. Will eval at next encounter.  -Next A1C anticipated 04/2018.   ASCVD risk - primary prevention in patient with DM. Last LDL is not controlled. ASCVD risk score is not >20%  - moderate intensity statin indicated, however, pt has not achieved LDL goal with moderate intensity therapy. She tolerates pravastatin well but has not tried high intensity therapy. Will stop pravastatin and start atorvastatin. - Stop pravastatin - Start atorvastatin 40 mg daily  Hypertension not addressed at this visit. Microalb:cretatinine ratio > 30. ACE-inhibitor included in current regimen. -Continue current regimen for now.    Written patient instructions provided.  Total time in face to face counseling 15 minutes.   Follow up Pharmacist Clinic Visit in 1 month.     Patient seen with: Arletha PiliJennie Wilson, PharmD Candidate  [facility] [employer]  Class of 2022  Butch PennyLuke Van Ausdall, PharmD, CPP Clinical Pharmacist [facility] [phone]

## 2018-02-07 NOTE — Patient Instructions (Addendum)
C?m ?n b?n ? ??n g?p ti hm nay. Vui lng lm nh? sau:   1. Chng ti ?ang t?ng li?u Janumet. Ti?p t?c u?ng 1 vin hai l?n m?t ngy.  2. Ng?ng Pravastatin. B?t ??u atorvastatin cho cholesterol.  3. Ti?p t?c ki?m tra ???ng mu t?i nh.  4. Ti?p t?c th?c hi?n cc thay ??i l?i s?ng m chng ti ? th?o lu?n cng nhau trong chuy?n th?m c?a chng ti. Ch? ?? ?n u?ng v t?p th? d?c ?ng m?t vai tr quan tr?ng trong vi?c c?i thi?n l??ng ???ng trong mu c?a b?n.  5. Theo di v?i ti vo thng t?i.   English translation:  Thank you for coming to see me today. Please do the following:  1. We are increasing the dose of Janumet. Continue to take 1 pill twice a day. 2. Stop pravastatin. Start atorvastatin for cholesterol. 3. Continue checking blood sugars at home. 4. Continue making the lifestyle changes we've discussed together during our visit. Diet and exercise play a significant role in improving your blood sugars.  5. Follow-up with me next month.

## 2018-03-10 ENCOUNTER — Encounter: Payer: Self-pay | Admitting: Pharmacist

## 2018-03-10 ENCOUNTER — Ambulatory Visit: Payer: Medicare Other | Attending: Nurse Practitioner | Admitting: Pharmacist

## 2018-03-10 DIAGNOSIS — E119 Type 2 diabetes mellitus without complications: Secondary | ICD-10-CM

## 2018-03-10 DIAGNOSIS — Z794 Long term (current) use of insulin: Secondary | ICD-10-CM

## 2018-03-10 LAB — GLUCOSE, POCT (MANUAL RESULT ENTRY): POC Glucose: 120 mg/dl — AB (ref 70–99)

## 2018-03-10 MED ORDER — SITAGLIPTIN PHOS-METFORMIN HCL 50-1000 MG PO TABS: 1.0000 | ORAL_TABLET | Freq: Two times a day (BID) | ORAL | 0 refills | Status: DC

## 2018-03-10 MED ORDER — TETANUS-DIPHTH-ACELL PERTUSSIS 5-2-15.5 LF-MCG/0.5 IM SUSP: 0.5000 mL | Freq: Once | INTRAMUSCULAR | 0 refills | Status: AC

## 2018-03-10 MED ORDER — ATORVASTATIN CALCIUM 40 MG PO TABS: 40.0000 mg | ORAL_TABLET | Freq: Every day | ORAL | 0 refills | Status: DC

## 2018-03-10 MED FILL — ADACEL 5-2-15.5 LF-MCG/0.5: 5-2-15.5 | 1 days supply | Qty: 1 | Fill #0

## 2018-03-10 NOTE — Patient Instructions (Addendum)
C?m ?n b?n ? ??n g?p ti hm nay. Hy lm nh? sau:   1. Ti?p t?c Janumet hai l?n m?t ngy.  2. Ti?p t?c atorvastatin m?i ngy m?t l?n.  3. Ti?p t?c t?t c? cc lo?i thu?c khc.  4. Ti?p t?c ki?m tra ???ng mu t?i nh. ?i?u th?c s? quan tr?ng l b?n ghi l?i nh?ng ?i?u ny v mang nh?ng ?i?u ny ??n cu?c h?n v?i bc s? ti?p theo c?a b?n. N?u b?n nh?n ???c trong cc bi ??c trn 500 ho?c d??i 70, hy g?i cho ti ho?c phng khm ?? cho bc s? c?a b?n bi?t.  5. Xem bn d??i v? cch ?i?u tr? l??ng ???ng trong mu th?p. Ti?p t?c th?c hi?n cc thay ??i l?i s?ng m chng ti ? th?o lu?n cng nhau trong chuy?n th?m c?a chng ti. Ch? ?? ?n u?ng v t?p th? d?c ?ng m?t vai tr quan tr?ng trong vi?c c?i thi?n l??ng ???ng trong mu c?a b?n.  6. Theo di s?m v?i PCP.  7. B?n s? ph?i ??i ??n ngy 30 thng 3 ?? ??n nh thu?c.    English translation:  Thank you for coming to see me today. Please do the following:  1. Continue Janumet twice a day. 2. Continue atorvastatin once a day. 3. Continue all other medications.  4. Continue checking blood sugars at home. It's really important that you record these and bring these in to your next doctor's appointment. If you get in readings above 500 or lower than 70, call me or the clinic to let your doctor know. See below on how to treat low blood sugar.  5. Continue making the lifestyle changes we've discussed together during our visit. Diet and exercise play a significant role in improving your blood sugars.  6. Follow-up with PCP soon. 7. You'll have to wait until March 30th to come to the pharmacy.

## 2018-03-10 NOTE — Progress Notes (Signed)
    S:    PCP: Zelda  No chief complaint on file.  Patient arrives in good spirits. Presents for diabetes evaluation, education, and management at the request of Zelda. Patient was referred on 01/17/2018. I last saw her on 02/07/18 - we increased her Janumet to 50-1000 mg BID.   Family/Social History:  - HTN (Mother and Father) - Tobacco: never smoker - Alcohol: denies use  Insurance coverage/medication affordability:  - Advertising copywriter  Patient reports adherence with medications.  Current diabetes medications include: Janumet 50-500 mg BID - of note, takes PM Janumet at 2 PM.   Patient denies hypoglycemic events.  Patient reported dietary habits:  - cutting back on white rice  Patient-reported exercise habits:  - gaining energy - walking and stretching 30-40 minutes/day   Patient denies nocturia.  Patient denies neuropathy. Patient denies visual changes. Patient reports self foot exams.  O:  POCT: 120 Home fasting CBG: 90 - 120  Lab Results  Component Value Date   HGBA1C 8.3 (A) 01/17/2018   There were no vitals filed for this visit.  Lipid Panel     Component Value Date/Time   CHOL 194 10/11/2017 1014   TRIG 85 10/11/2017 1014   HDL 72 10/11/2017 1014   CHOLHDL 2.7 10/11/2017 1014   CHOLHDL 2.0 11/28/2015 1223   VLDL 15 11/28/2015 1223   LDLCALC 105 (H) 10/11/2017 1014   Clinical ASCVD: No  The 10-year ASCVD risk score Denman George DC Jr., et al., 2013) is: 16.4%   Values used to calculate the score:     Age: 5 years     Sex: Female     Is Non-Hispanic African American: No     Diabetic: Yes     Tobacco smoker: No     Systolic Blood Pressure: 132 mmHg     Is BP treated: Yes     HDL Cholesterol: 72 mg/dL     Total Cholesterol: 194 mg/dL   A/P: Diabetes longstanding currently uncontrolled. Goal A1c <7 - can make a case for a less stringent goal of < 7.5 given her age per ADA guidelines. Her home sugars look great and she is tolerating her Janumet well.  Patient is able to verbalize appropriate hypoglycemia management plan. Patient is adherent with medication.   Of note, she is traveling out of [place] in [place]. I have coordinated with the pharmacy here to ensure that she receives a 90-day supply before leaving.   - Continued Janumet 50-1000 mg BID. -Extensively discussed pathophysiology of DM, recommended lifestyle interventions, dietary effects on glycemic control -Counseled on s/sx of and management of hypoglycemia -HM: due for tetanus vaccine; Adacel given in clinic -Next A1C anticipated 04/2018.   ASCVD risk - primary prevention in patient with DM. Last LDL is not controlled. We changed her pravastatin to atorvastatin 40 and she is tolerating this well. Will continue atorvastatin.  - Continued atorvastatin 40 mg daily   Written patient instructions provided.  Total time in face to face counseling 15 minutes.   Follow up Pharmacist Clinic Visit in 1 month.     Patient seen with: Arletha Pili, PharmD Candidate  [facility] [employer]  Class of 2022  Butch Penny, PharmD, CPP Clinical Pharmacist [facility] [phone]

## 2018-04-06 MED FILL — ATORVASTATIN CALCIUM 40 MG: 40 | 90 days supply | Qty: 90 | Fill #0

## 2018-04-06 MED FILL — HYDROCHLOROTHIAZIDE 25 MG T: 25 | 90 days supply | Qty: 90 | Fill #1

## 2018-04-06 MED FILL — LISINOPRIL 40 MG TABLET: 40 | 90 days supply | Qty: 90 | Fill #1

## 2018-04-06 MED FILL — JANUMET 50-1,000 MG TABLET: 50-1000 | 90 days supply | Qty: 180 | Fill #0

## 2018-04-06 MED FILL — VIT D2 1.25 MG (50,000 UNIT: 1.25 MG | 84 days supply | Qty: 12 | Fill #1

## 2018-04-06 MED FILL — AMLODIPINE BESYLATE 10 MG T: 10 | 90 days supply | Qty: 90 | Fill #1

## 2018-04-11 ENCOUNTER — Ambulatory Visit: Payer: Medicare Other | Admitting: Nurse Practitioner

## 2018-05-20 ENCOUNTER — Ambulatory Visit: Payer: Medicare Other | Attending: Nurse Practitioner | Admitting: Nurse Practitioner

## 2018-05-20 ENCOUNTER — Encounter: Payer: Self-pay | Admitting: Nurse Practitioner

## 2018-05-20 ENCOUNTER — Other Ambulatory Visit: Payer: Self-pay

## 2018-05-20 VITALS — BP 125/76 | HR 76 | Temp 98.3°F | Ht 59.0 in | Wt 134.6 lb

## 2018-05-20 DIAGNOSIS — Z1231 Encounter for screening mammogram for malignant neoplasm of breast: Secondary | ICD-10-CM

## 2018-05-20 DIAGNOSIS — Z780 Asymptomatic menopausal state: Secondary | ICD-10-CM

## 2018-05-20 DIAGNOSIS — I10 Essential (primary) hypertension: Secondary | ICD-10-CM

## 2018-05-20 DIAGNOSIS — E119 Type 2 diabetes mellitus without complications: Secondary | ICD-10-CM

## 2018-05-20 DIAGNOSIS — E782 Mixed hyperlipidemia: Secondary | ICD-10-CM

## 2018-05-20 DIAGNOSIS — E559 Vitamin D deficiency, unspecified: Secondary | ICD-10-CM

## 2018-05-20 DIAGNOSIS — E1165 Type 2 diabetes mellitus with hyperglycemia: Secondary | ICD-10-CM | POA: Diagnosis not present

## 2018-05-20 DIAGNOSIS — Z794 Long term (current) use of insulin: Secondary | ICD-10-CM | POA: Diagnosis not present

## 2018-05-20 LAB — GLUCOSE, POCT (MANUAL RESULT ENTRY): POC Glucose: 240 mg/dl — AB (ref 70–99)

## 2018-05-20 LAB — POCT GLYCOSYLATED HEMOGLOBIN (HGB A1C): Hemoglobin A1C: 9.4 % — AB (ref 4.0–5.6)

## 2018-05-20 MED ORDER — SITAGLIPTIN PHOS-METFORMIN HCL 50-1000 MG PO TABS: 1.0000 | ORAL_TABLET | Freq: Two times a day (BID) | ORAL | 0 refills | Status: DC

## 2018-05-20 MED ORDER — ATORVASTATIN CALCIUM 40 MG PO TABS: 40.0000 mg | ORAL_TABLET | Freq: Every day | ORAL | 0 refills | Status: DC

## 2018-05-20 MED ORDER — GLUCOSE BLOOD VI STRP: ORAL_STRIP | 12 refills | Status: DC

## 2018-05-20 MED ORDER — AMLODIPINE BESYLATE 10 MG PO TABS: 10.0000 mg | ORAL_TABLET | Freq: Every day | ORAL | 3 refills | Status: DC

## 2018-05-20 MED ORDER — HYDROCHLOROTHIAZIDE 25 MG PO TABS: 25.0000 mg | ORAL_TABLET | Freq: Every day | ORAL | 3 refills | Status: DC

## 2018-05-20 MED ORDER — GLIMEPIRIDE 2 MG PO TABS: 2.0000 mg | ORAL_TABLET | Freq: Every day | ORAL | 1 refills | Status: DC

## 2018-05-20 MED ORDER — ACCU-CHEK SOFTCLIX LANCETS MISC: 2 refills | Status: DC

## 2018-05-20 MED ORDER — LISINOPRIL 40 MG PO TABS: 40.0000 mg | ORAL_TABLET | Freq: Every day | ORAL | 3 refills | Status: DC

## 2018-05-20 MED FILL — ACCU-CHEK SOFTCLIX LANCETS: 50 days supply | Qty: 100 | Fill #0

## 2018-05-20 MED FILL — ACCU-CHEK AVIVA PLUS STRP: 50 days supply | Qty: 100 | Fill #0

## 2018-05-20 MED FILL — GLIMEPIRIDE 2 MG TABS: 2 | 30 days supply | Qty: 30 | Fill #0

## 2018-05-20 NOTE — Progress Notes (Signed)
Assessment & Plan:  Aimee Parker was seen today for follow-up.  Diagnoses and all orders for this visit:  Type 2 diabetes mellitus without complication, with long-term current use of insulin (HCC) -     Cancel: Hemoglobin A1c -     Glucose (CBG) -     sitaGLIPtin-metformin (JANUMET) 50-1000 MG tablet; Take 1 tablet by mouth 2 (two) times daily with a meal. -     glucose blood (ACCU-CHEK AVIVA) test strip; Use as instructed. Check blood glucose levels twice per day by fingerstick -     Accu-Chek Softclix Lancets lancets; Use as instructed. Check blood glucose levels twice per day by fingerstick -     glimepiride (AMARYL) 2 MG tablet; Take 1 tablet (2 mg total) by mouth daily before breakfast for 30 days. -     HgB A1c Diabetes is poorly controlled. Advised patient to keep a fasting blood sugar log fast, 2 hours post lunch and bedtime which will be reviewed at the next office visit.   Essential hypertension -     amLODipine (NORVASC) 10 MG tablet; Take 1 tablet (10 mg total) by mouth daily. -     lisinopril (ZESTRIL) 40 MG tablet; Take 1 tablet (40 mg total) by mouth daily. -     hydrochlorothiazide (HYDRODIURIL) 25 MG tablet; Take 1 tablet (25 mg total) by mouth daily. Continue all antihypertensives as prescribed.  Remember to bring in your blood pressure log with you for your follow up appointment.  DASH/Mediterranean Diets are healthier choices for HTN.    Vitamin D deficiency -     VITAMIN D 25 Hydroxy (Vit-D Deficiency, Fractures)  Mixed hyperlipidemia -     atorvastatin (LIPITOR) 40 MG tablet; Take 1 tablet (40 mg total) by mouth daily.  Postmenopausal estrogen deficiency -     DG Bone Density; Future  Breast cancer screening by mammogram -     MM 3D SCREEN BREAST BILATERAL; Future    Patient has been counseled on age-appropriate routine health concerns for screening and prevention. These are reviewed and up-to-date. Referrals have been placed accordingly. Immunizations are  up-to-date or declined.    Subjective:   Chief Complaint  Patient presents with  . Follow-up    Pt. is here to follow up on diabetes.    HPI Aimee Parker 68 y.o. female presents to office today for follow up to HTN and DM. She still has not had her bone density or mammogram completed.  VRI was used to communicate directly with patient for the entire encounter including providing detailed patient instructions.   DM TYPE 2 Chronic. Gradually increasing despite her endorsement of medication compliance. Current medications include janumet 50-1000 mg BID. Fasting 120-130s. She has not been monitoring any post prandial readings. I have now instructed her to monitor both fasting or post prandial readings. She verbalized understanding. Denies any hypo or hyperglycemic symptoms. Will add amaryl 85m today. She is to continue Janumet 50-1000 mg BID.  Lab Results  Component Value Date   HGBA1C 9.4 (A) 05/20/2018   Lab Results  Component Value Date   HGBA1C 8.3 (A) 01/17/2018    Essential Hypertension Chronic and well controlled. She monitors her blood pressure at home with average SBP 130s. Denies chest pain, shortness of breath, palpitations, lightheadedness, dizziness, headaches or BLE edema.  BP Readings from Last 3 Encounters:  05/20/18 125/76  01/17/18 132/71  10/11/17 (!) 149/74  Taking amlodipine 13m lisinopril 40 mg and HCTZ 2538maily as prescribed.  Hyperlipidemia LDL not at goal <70. Current medications include atorvastatin 40 mg daily. She denies any statin intolerance.  Lab Results  Component Value Date   LDLCALC 105 (H) 10/11/2017   Review of Systems  Constitutional: Negative for fever, malaise/fatigue and weight loss.  HENT: Negative.  Negative for nosebleeds.   Eyes: Negative.  Negative for blurred vision, double vision and photophobia.  Respiratory: Negative.  Negative for cough and shortness of breath.   Cardiovascular: Negative.  Negative for chest pain,  palpitations and leg swelling.  Gastrointestinal: Negative.  Negative for heartburn, nausea and vomiting.  Musculoskeletal: Negative.  Negative for myalgias.  Neurological: Negative.  Negative for dizziness, focal weakness, seizures and headaches.  Psychiatric/Behavioral: Negative.  Negative for suicidal ideas.    Past Medical History:  Diagnosis Date  . Diabetes mellitus without complication ([place])   . Hypertension     Past Surgical History:  Procedure Laterality Date  . EYE SURGERY     Left eye surgery  . PARS PLANA VITRECTOMY Right 05/13/2017   Procedure: PARS PLANA VITRECTOMY 25 GAUGE FOR ENDOPHTHALMITIS;  Surgeon: Hurman Horn, MD;  Location: [facility];  Service: Ophthalmology;  Laterality: Right;    Family History  Problem Relation Age of Onset  . Hypertension Mother   . Hypertension Father     Social History Reviewed with no changes to be made today.   Outpatient Medications Prior to Visit  Medication Sig Dispense Refill  . Blood Glucose Monitoring Suppl (ACCU-CHEK AVIVA PLUS) w/Device KIT 1 each by Does not apply route 3 (three) times daily. METER STOPPED WORKING 1 kit 0  . Vitamin D, Ergocalciferol, (DRISDOL) 1.25 MG (50000 UT) CAPS capsule Take 1 capsule (50,000 Units total) by mouth every 7 (seven) days. 12 capsule 3  . ACCU-CHEK SOFTCLIX LANCETS lancets Use as instructed. Check blood glucose levels twice per day by fingerstick 100 each 2  . amLODipine (NORVASC) 10 MG tablet Take 1 tablet (10 mg total) by mouth daily. 90 tablet 3  . atorvastatin (LIPITOR) 40 MG tablet Take 1 tablet (40 mg total) by mouth daily. 90 tablet 0  . glucose blood (ACCU-CHEK AVIVA) test strip Use as instructed. Check blood glucose levels twice per day by fingerstick 100 each 12  . hydrochlorothiazide (HYDRODIURIL) 25 MG tablet Take 1 tablet (25 mg total) by mouth daily. 90 tablet 3  . lisinopril (PRINIVIL,ZESTRIL) 40 MG tablet Take 1 tablet (40 mg total) by mouth daily. 90 tablet 3  .  sitaGLIPtin-metformin (JANUMET) 50-1000 MG tablet Take 1 tablet by mouth 2 (two) times daily with a meal. 180 tablet 0   No facility-administered medications prior to visit.     No Known Allergies     Objective:    BP 125/76 (BP Location: Right Arm, Patient Position: Sitting, Cuff Size: Normal)   Pulse 76   Temp 98.3 F (36.8 C) (Oral)   Ht _0  (1.499 m)   Wt 134 lb 9.6 oz (61.1 kg)   SpO2 99%   BMI 27.19 kg/m  Wt Readings from Last 3 Encounters:  05/20/18 134 lb 9.6 oz (61.1 kg)  01/17/18 132 lb (59.9 kg)  10/11/17 128 lb 12.8 oz (58.4 kg)    Physical Exam Vitals signs and nursing note reviewed.  Constitutional:      Appearance: She is well-developed.  HENT:     Head: Normocephalic and atraumatic.  Neck:     Musculoskeletal: Normal range of motion.  Cardiovascular:     Rate and Rhythm: Normal  rate and regular rhythm.     Heart sounds: Murmur present. No friction rub. No gallop.   Pulmonary:     Effort: Pulmonary effort is normal. No tachypnea or respiratory distress.     Breath sounds: Normal breath sounds. No decreased breath sounds, wheezing, rhonchi or rales.  Chest:     Chest wall: No tenderness.  Abdominal:     General: Bowel sounds are normal.     Palpations: Abdomen is soft.  Musculoskeletal: Normal range of motion.  Skin:    General: Skin is warm and dry.  Neurological:     Mental Status: She is alert and oriented to person, place, and time.     Coordination: Coordination normal.  Psychiatric:        Behavior: Behavior normal. Behavior is cooperative.        Thought Content: Thought content normal.        Judgment: Judgment normal.        Patient has been counseled extensively about nutrition and exercise as well as the importance of adherence with medications and regular follow-up. The patient was given clear instructions to go to ER or return to medical center if symptoms don't improve, worsen or new problems develop. The patient verbalized  understanding.   Follow-up: Return in about 6 weeks (around 07/01/2018) for Meter check .   Gildardo Pounds, FNP-BC [facility] and [facility], [place]   05/20/2018, 10:21 AM

## 2018-05-21 LAB — VITAMIN D 25 HYDROXY (VIT D DEFICIENCY, FRACTURES): Vit D, 25-Hydroxy: 55.6 ng/mL (ref 30.0–100.0)

## 2018-05-31 ENCOUNTER — Telehealth: Payer: Self-pay

## 2018-05-31 NOTE — Telephone Encounter (Signed)
-----   Message from Claiborne Rigg, NP sent at 05/23/2018 12:58 PM EDT ----- Vitamin D is normal. Continue to take vitamin D 1000 units daily.

## 2018-05-31 NOTE — Telephone Encounter (Signed)
CMA attempt to reach patient to inform on results  No answer and unable to leave a VM due to mailbox has not been set up.

## 2018-05-31 NOTE — Progress Notes (Signed)
CMA cannot reach patient on the phone for lab results report.  Will mail out patient lab results.

## 2018-07-04 ENCOUNTER — Ambulatory Visit: Payer: Medicare Other | Attending: Nurse Practitioner | Admitting: Nurse Practitioner

## 2018-07-04 ENCOUNTER — Encounter: Payer: Self-pay | Admitting: Nurse Practitioner

## 2018-07-04 ENCOUNTER — Other Ambulatory Visit: Payer: Self-pay

## 2018-07-04 DIAGNOSIS — E782 Mixed hyperlipidemia: Secondary | ICD-10-CM

## 2018-07-04 DIAGNOSIS — I10 Essential (primary) hypertension: Secondary | ICD-10-CM | POA: Diagnosis not present

## 2018-07-04 DIAGNOSIS — IMO0002 Reserved for concepts with insufficient information to code with codable children: Secondary | ICD-10-CM

## 2018-07-04 DIAGNOSIS — Z794 Long term (current) use of insulin: Secondary | ICD-10-CM | POA: Diagnosis not present

## 2018-07-04 DIAGNOSIS — E119 Type 2 diabetes mellitus without complications: Secondary | ICD-10-CM

## 2018-07-04 DIAGNOSIS — E1165 Type 2 diabetes mellitus with hyperglycemia: Secondary | ICD-10-CM | POA: Diagnosis not present

## 2018-07-04 MED ORDER — AMLODIPINE BESYLATE 10 MG PO TABS: 10.0000 mg | ORAL_TABLET | Freq: Every day | ORAL | 3 refills | Status: DC

## 2018-07-04 MED ORDER — LISINOPRIL 40 MG PO TABS: 40.0000 mg | ORAL_TABLET | Freq: Every day | ORAL | 3 refills | Status: DC

## 2018-07-04 MED ORDER — HYDROCHLOROTHIAZIDE 25 MG PO TABS: 25.0000 mg | ORAL_TABLET | Freq: Every day | ORAL | 3 refills | Status: DC

## 2018-07-04 MED ORDER — ATORVASTATIN CALCIUM 40 MG PO TABS: 40.0000 mg | ORAL_TABLET | Freq: Every day | ORAL | 0 refills | Status: DC

## 2018-07-04 MED ORDER — ACCU-CHEK SOFTCLIX LANCETS MISC: 3 refills | Status: DC

## 2018-07-04 MED ORDER — GLIMEPIRIDE 2 MG PO TABS: 2.0000 mg | ORAL_TABLET | Freq: Every day | ORAL | 1 refills | Status: DC

## 2018-07-04 MED ORDER — JANUMET 50-1000 MG PO TABS: 1.0000 | ORAL_TABLET | Freq: Two times a day (BID) | ORAL | 0 refills | Status: DC

## 2018-07-04 MED ORDER — ACCU-CHEK AVIVA VI STRP: ORAL_STRIP | 3 refills | Status: DC

## 2018-07-04 NOTE — Progress Notes (Signed)
Virtual Visit via Telephone Note Due to national recommendations of social distancing due to COVID 19, telehealth visit is felt to be most appropriate for this patient at this time.  I discussed the limitations, risks, security and privacy concerns of performing an evaluation and management service by telephone and the availability of in person appointments. I also discussed with the patient that there may be a patient responsible charge related to this service. The patient expressed understanding and agreed to proceed.    I connected with Aggie MoatsMai Thi Bolz on 07/04/18  at   8:50 AM EDT  EDT by telephone and verified that I am speaking with the correct person using two identifiers.   Consent I discussed the limitations, risks, security and privacy concerns of performing an evaluation and management service by telephone and the availability of in person appointments. I also discussed with the patient that there may be a patient responsible charge related to this service. The patient expressed understanding and agreed to proceed.   Location of Patient: [facility]    [facility]    Persons participating in Telemedicine visit: Bertram DenverZelda Granite Godman FNP-BC YY VictoriaBien CMA Tymber Thi Georgia Lopesguyen  Helen ID vietnamese intepreter [id number]   History of Present Illness: Telemedicine visit for: DM TYPE 2  has a past medical history of Diabetes mellitus without complication (HCC), Hypertension, and Vitamin D deficiency.   Vitamin D is normal at this time. I have recommended Citracal 2 tablets per day.    Essential Hypertension Blood pressure is well controlled. She monitors her blood pressure every morning and afternoon. Today's reading 125/70. Lisinopril 40 mg daily, HCTZ 25mg  daily and amlodipine 10 mg daily. Taking all medications as prescribed. Denies chest pain, shortness of breath, palpitations, lightheadedness, dizziness, headaches or BLE edema.  BP Readings from  Last 3 Encounters:  05/20/18 125/76  01/17/18 132/71  10/11/17 (!) 149/74    DM TYPE 2 Chronic and not well controlled. Taking amaryl 2 mg daily and janumet 50-1000 mg BID as prescribed. Denies any hypo or hyperglycemic symptoms. Monitoring blood glucose twice per day. Fasting average 100-120s. Postprandial: 200s. We discussed carb reduction in diet. She verbalized understanding. She has been skipping meals. I encouraged her to not skip meals but rather cut back on portion sizes.  Lab Results  Component Value Date   HGBA1C 9.4 (A) 05/20/2018    Past Medical History:  Diagnosis Date  . Diabetes mellitus without complication (HCC)   . Hypertension   . Vitamin D deficiency     Past Surgical History:  Procedure Laterality Date  . EYE SURGERY     Left eye surgery  . PARS PLANA VITRECTOMY Right 05/13/2017   Procedure: PARS PLANA VITRECTOMY 25 GAUGE FOR ENDOPHTHALMITIS;  Surgeon: Edmon Crapeankin, Gary A, MD;  [facility];  Service: Ophthalmology;  Laterality: Right;    Family History  Problem Relation Age of Onset  . Hypertension Mother   . Hypertension Father     Social History   Socioeconomic History  . Marital status: Married    Spouse name: Not on file  . Number of children: Not on file  . Years of education: Not on file  . Highest education level: Not on file  Occupational History  . Not on file  Social Needs  . Financial resource strain: Not on file  . Food insecurity    Worry: Not on file    Inability: Not on file  . Transportation needs  Medical: Not on file    Non-medical: Not on file  Tobacco Use  . Smoking status: Never Smoker  . Smokeless tobacco: Never Used  Substance and Sexual Activity  . Alcohol use: No  . Drug use: No  . Sexual activity: Not Currently  Lifestyle  . Physical activity    Days per week: Not on file    Minutes per session: Not on file  . Stress: Not on file  Relationships  . Social Herbalist on phone: Not on file    Gets  together: Not on file    Attends religious service: Not on file    Active member of club or organization: Not on file    Attends meetings of clubs or organizations: Not on file    Relationship status: Not on file  Other Topics Concern  . Not on file  Social History Narrative  . Not on file     Observations/Objective: Awake, alert and oriented x 3   Review of Systems  Constitutional: Negative for fever, malaise/fatigue and weight loss.  HENT: Negative.  Negative for nosebleeds.   Eyes: Negative.  Negative for blurred vision, double vision and photophobia.  Respiratory: Negative.  Negative for cough and shortness of breath.   Cardiovascular: Negative.  Negative for chest pain, palpitations and leg swelling.  Gastrointestinal: Negative.  Negative for heartburn, nausea and vomiting.  Musculoskeletal: Negative.  Negative for myalgias.  Neurological: Negative.  Negative for dizziness, focal weakness, seizures and headaches.  Psychiatric/Behavioral: Negative.  Negative for suicidal ideas.    Assessment and Plan: Shaguana was seen today for follow-up.  Diagnoses and all orders for this visit:  Essential hypertension  Diabetes mellitus type 2, uncontrolled, with complications ([place])  Type 2 diabetes mellitus without complication, with long-term current use of insulin ([place])  Mixed hyperlipidemia     Follow Up Instructions No follow-ups on file.     I discussed the assessment and treatment plan with the patient. The patient was provided an opportunity to ask questions and all were answered. The patient agreed with the plan and demonstrated an understanding of the instructions.   The patient was advised to call back or seek an in-person evaluation if the symptoms worsen or if the condition fails to improve as anticipated.  I provided 26 minutes of non-face-to-face time during this encounter including median intraservice time, reviewing previous notes, labs, imaging, medications and  explaining diagnosis and management.  Gildardo Pounds, FNP-BC

## 2018-07-11 ENCOUNTER — Other Ambulatory Visit: Payer: Self-pay

## 2018-07-11 DIAGNOSIS — I10 Essential (primary) hypertension: Secondary | ICD-10-CM

## 2018-07-11 DIAGNOSIS — E119 Type 2 diabetes mellitus without complications: Secondary | ICD-10-CM

## 2018-07-11 DIAGNOSIS — E782 Mixed hyperlipidemia: Secondary | ICD-10-CM

## 2018-07-11 MED ORDER — LISINOPRIL 40 MG PO TABS: 40.0000 mg | ORAL_TABLET | Freq: Every day | ORAL | 3 refills | Status: DC

## 2018-07-11 MED ORDER — ACCU-CHEK SOFTCLIX LANCETS MISC: 3 refills | Status: DC

## 2018-07-11 MED ORDER — GLIMEPIRIDE 2 MG PO TABS: 2.0000 mg | ORAL_TABLET | Freq: Every day | ORAL | 1 refills | Status: DC

## 2018-07-11 MED ORDER — ATORVASTATIN CALCIUM 40 MG PO TABS: 40.0000 mg | ORAL_TABLET | Freq: Every day | ORAL | 0 refills | Status: DC

## 2018-07-11 MED ORDER — HYDROCHLOROTHIAZIDE 25 MG PO TABS: 25.0000 mg | ORAL_TABLET | Freq: Every day | ORAL | 3 refills | Status: DC

## 2018-07-11 MED ORDER — AMLODIPINE BESYLATE 10 MG PO TABS: 10.0000 mg | ORAL_TABLET | Freq: Every day | ORAL | 3 refills | Status: DC

## 2018-07-11 MED ORDER — ACCU-CHEK AVIVA VI STRP: ORAL_STRIP | 3 refills | Status: DC

## 2018-07-11 MED ORDER — JANUMET 50-1000 MG PO TABS: 1.0000 | ORAL_TABLET | Freq: Two times a day (BID) | ORAL | 0 refills | Status: DC

## 2018-07-11 MED FILL — ATORVASTATIN 40 MG TABLET: 40 | 90 days supply | Qty: 90 | Fill #0

## 2018-07-11 MED FILL — ACCU-CHEK AVIVA PLUS STRP: 50 days supply | Qty: 100 | Fill #0

## 2018-07-11 MED FILL — LISINOPRIL 40 MG TAB: 40 | 90 days supply | Qty: 90 | Fill #0

## 2018-07-11 MED FILL — GLIMEPIRIDE 2 MG TABS: 2 | 90 days supply | Qty: 90 | Fill #0

## 2018-07-11 MED FILL — ACCU-CHEK SOFTCLIX LANCETS: 10 days supply | Qty: 200 | Fill #0

## 2018-07-11 MED FILL — HYDROCHLOROTHIAZIDE 25 MG T: 25 | 90 days supply | Qty: 90 | Fill #0

## 2018-07-11 MED FILL — JANUMET 50-1,000 MG TABLET: 50-1000 | 90 days supply | Qty: 180 | Fill #0

## 2018-07-11 MED FILL — AMLODIPINE BESYLATE 10 MG T: 10 | 90 days supply | Qty: 90 | Fill #0

## 2018-08-24 ENCOUNTER — Ambulatory Visit: Payer: Medicare Other | Admitting: Nurse Practitioner

## 2018-08-30 ENCOUNTER — Other Ambulatory Visit: Payer: Self-pay

## 2018-08-30 ENCOUNTER — Encounter: Payer: Self-pay | Admitting: Nurse Practitioner

## 2018-08-30 ENCOUNTER — Ambulatory Visit: Payer: Medicare Other | Attending: Nurse Practitioner | Admitting: Nurse Practitioner

## 2018-08-30 VITALS — BP 154/82 | HR 74 | Temp 98.8°F | Ht 59.0 in | Wt 135.0 lb

## 2018-08-30 DIAGNOSIS — Z794 Long term (current) use of insulin: Secondary | ICD-10-CM | POA: Diagnosis not present

## 2018-08-30 DIAGNOSIS — E782 Mixed hyperlipidemia: Secondary | ICD-10-CM | POA: Diagnosis not present

## 2018-08-30 DIAGNOSIS — Z1211 Encounter for screening for malignant neoplasm of colon: Secondary | ICD-10-CM

## 2018-08-30 DIAGNOSIS — I10 Essential (primary) hypertension: Secondary | ICD-10-CM

## 2018-08-30 DIAGNOSIS — E559 Vitamin D deficiency, unspecified: Secondary | ICD-10-CM

## 2018-08-30 DIAGNOSIS — E119 Type 2 diabetes mellitus without complications: Secondary | ICD-10-CM | POA: Diagnosis not present

## 2018-08-30 DIAGNOSIS — Z23 Encounter for immunization: Secondary | ICD-10-CM | POA: Diagnosis not present

## 2018-08-30 LAB — POCT GLYCOSYLATED HEMOGLOBIN (HGB A1C): Hemoglobin A1C: 7.7 % — AB (ref 4.0–5.6)

## 2018-08-30 LAB — GLUCOSE, POCT (MANUAL RESULT ENTRY): POC Glucose: 112 mg/dl — AB (ref 70–99)

## 2018-08-30 MED ORDER — LISINOPRIL 40 MG PO TABS: 40.0000 mg | ORAL_TABLET | Freq: Every day | ORAL | 3 refills | Status: DC

## 2018-08-30 MED ORDER — HYDROCHLOROTHIAZIDE 25 MG PO TABS: 25.0000 mg | ORAL_TABLET | Freq: Every day | ORAL | 3 refills | Status: DC

## 2018-08-30 MED ORDER — AMLODIPINE BESYLATE 10 MG PO TABS: 10.0000 mg | ORAL_TABLET | Freq: Every day | ORAL | 3 refills | Status: DC

## 2018-08-30 MED ORDER — GLUCOSE BLOOD VI STRP: ORAL_STRIP | 12 refills | Status: DC

## 2018-08-30 MED ORDER — GLIMEPIRIDE 2 MG PO TABS: 2.0000 mg | ORAL_TABLET | Freq: Every day | ORAL | 1 refills | Status: DC

## 2018-08-30 MED ORDER — ATORVASTATIN CALCIUM 40 MG PO TABS: 40.0000 mg | ORAL_TABLET | Freq: Every day | ORAL | 0 refills | Status: DC

## 2018-08-30 MED ORDER — JANUMET 50-1000 MG PO TABS: 1.0000 | ORAL_TABLET | Freq: Two times a day (BID) | ORAL | 0 refills | Status: DC

## 2018-08-30 MED FILL — ACCU-CHEK AVIVA PLUS STRP: 30 days supply | Qty: 100 | Fill #0

## 2018-08-30 NOTE — Progress Notes (Signed)
Assessment & Plan:  Aimee Parker was seen today for follow-up.  Diagnoses and all orders for this visit:  Type 2 diabetes mellitus without complication, with long-term current use of insulin (HCC) -     Glucose (CBG) -     HgB A1c -     glucose blood test strip; Provide patient with insurance approved strips. Use as instructed. Inject into the skin once daily. -     glimepiride (AMARYL) 2 MG tablet; Take 1 tablet (2 mg total) by mouth daily before breakfast. -     sitaGLIPtin-metformin (JANUMET) 50-1000 MG tablet; Take 1 tablet by mouth 2 (two) times daily with a meal. Continue blood sugar control as discussed in office today, low carbohydrate diet, and regular physical exercise as tolerated, 150 minutes per week (30 min each day, 5 days per week, or 50 min 3 days per week). Keep blood sugar logs with fasting goal of 90-130 mg/dl, post prandial (after you eat) less than 180.  For Hypoglycemia: BS <60 and Hyperglycemia BS >400; contact the clinic ASAP. Annual eye exams and foot exams are recommended.   Mixed hyperlipidemia -     atorvastatin (LIPITOR) 40 MG tablet; Take 1 tablet (40 mg total) by mouth daily. -     Lipid panel INSTRUCTIONS: Work on a low fat, heart healthy diet and participate in regular aerobic exercise program by working out at least 150 minutes per week; 5 days a week-30 minutes per day. Avoid red meat, fried foods. junk foods, sodas, sugary drinks, unhealthy snacking, alcohol and smoking.  Drink at least 48oz of water per day and monitor your carbohydrate intake daily.    Essential hypertension -     amLODipine (NORVASC) 10 MG tablet; Take 1 tablet (10 mg total) by mouth daily. -     hydrochlorothiazide (HYDRODIURIL) 25 MG tablet; Take 1 tablet (25 mg total) by mouth daily. -     lisinopril (ZESTRIL) 40 MG tablet; Take 1 tablet (40 mg total) by mouth daily. -     Basic metabolic panel -     CBC Continue all antihypertensives as prescribed.  Remember to bring in your blood  pressure log with you for your follow up appointment.  DASH/Mediterranean Diets are healthier choices for HTN.    Vitamin D deficiency -     VITAMIN D 25 Hydroxy (Vit-D Deficiency, Fractures)  Immunization due -     Pneumococcal polysaccharide vaccine 23-valent greater than or equal to 2yo subcutaneous/IM    Patient has been counseled on age-appropriate routine health concerns for screening and prevention. These are reviewed and up-to-date. Referrals have been placed accordingly. Immunizations are up-to-date or declined.    Subjective:   Chief Complaint  Patient presents with  . Follow-up    Pt. is here to follow up on diabetes.    HPI Aimee Parker 68 y.o. female presents to office today for follow up.  has a past medical history of Diabetes mellitus without complication ([place]), Hypertension, and Vitamin D deficiency.   DM TYPE 2 Well controlled. She is doing great today. Stopped eating rice. Current medications include janumet 50-1000 mg BID. She is monitoring her blood glucose levels once a day with average readings 90-120s fasting. She has her meter and log with her today. Current on eye exam and will receive PNA vaccine today. Denies any hypoglycemic symptoms.  Lab Results  Component Value Date   HGBA1C 7.7 (A) 08/30/2018   Lab Results  Component Value Date   HGBA1C  9.4 (A) 05/20/2018    Essential Hypertension She just came from the eye doctor today. Blood pressure slightly elevated. She endorses medication compliance taking HCTZ 25 mg dialy, lisinopril 40 mg daily and amlodipine 10 mg daily. She monitors her blood pressure at home and wrist cuff from home today correlates with office reading. Denies chest pain, shortness of breath, palpitations, lightheadedness, dizziness, headaches or BLE edema.  BP Readings from Last 3 Encounters:  08/30/18 (!) 154/82  05/20/18 125/76  01/17/18 132/71   LIPID LDL not at goal. Fasting lipid panel pending. She denies any statin  intolerance and takes atorvastatin 40 mg daily as prescribed.  Lab Results  Component Value Date   LDLCALC 105 (H) 10/11/2017    Review of Systems  Constitutional: Negative for fever, malaise/fatigue and weight loss.  HENT: Negative.  Negative for nosebleeds.   Eyes: Negative.  Negative for blurred vision, double vision and photophobia.  Respiratory: Negative.  Negative for cough and shortness of breath.   Cardiovascular: Negative.  Negative for chest pain, palpitations and leg swelling.  Gastrointestinal: Negative.  Negative for heartburn, nausea and vomiting.  Musculoskeletal: Negative.  Negative for myalgias.  Neurological: Negative.  Negative for dizziness, focal weakness, seizures and headaches.  Psychiatric/Behavioral: Negative.  Negative for suicidal ideas.    Past Medical History:  Diagnosis Date  . Diabetes mellitus without complication ([place])   . Hypertension   . Vitamin D deficiency     Past Surgical History:  Procedure Laterality Date  . EYE SURGERY     Left eye surgery  . PARS PLANA VITRECTOMY Right 05/13/2017   Procedure: PARS PLANA VITRECTOMY 25 GAUGE FOR ENDOPHTHALMITIS;  Surgeon: Hurman Horn, MD;  Location: [facility];  Service: Ophthalmology;  Laterality: Right;    Family History  Problem Relation Age of Onset  . Hypertension Mother   . Hypertension Father     Social History Reviewed with no changes to be made today.   Outpatient Medications Prior to Visit  Medication Sig Dispense Refill  . Accu-Chek Softclix Lancets lancets Use as instructed. Check blood glucose levels twice per day by fingerstick 200 each 3  . Blood Glucose Monitoring Suppl (ACCU-CHEK AVIVA PLUS) w/Device KIT 1 each by Does not apply route 3 (three) times daily. METER STOPPED WORKING 1 kit 0  . Vitamin D, Ergocalciferol, (DRISDOL) 1.25 MG (50000 UT) CAPS capsule Take 1 capsule (50,000 Units total) by mouth every 7 (seven) days. 12 capsule 3  . amLODipine (NORVASC) 10 MG tablet Take 1  tablet (10 mg total) by mouth daily. 90 tablet 3  . atorvastatin (LIPITOR) 40 MG tablet Take 1 tablet (40 mg total) by mouth daily. 90 tablet 0  . glimepiride (AMARYL) 2 MG tablet Take 1 tablet (2 mg total) by mouth daily before breakfast. 90 tablet 1  . hydrochlorothiazide (HYDRODIURIL) 25 MG tablet Take 1 tablet (25 mg total) by mouth daily. 90 tablet 3  . lisinopril (ZESTRIL) 40 MG tablet Take 1 tablet (40 mg total) by mouth daily. 90 tablet 3  . sitaGLIPtin-metformin (JANUMET) 50-1000 MG tablet Take 1 tablet by mouth 2 (two) times daily with a meal. 180 tablet 0  . glucose blood (ACCU-CHEK AVIVA) test strip Use as instructed. Check blood glucose levels twice per day by fingerstick 200 each 3   No facility-administered medications prior to visit.     No Known Allergies     Objective:    BP (!) 154/82 (BP Location: Right Arm, Patient Position: Sitting, Cuff Size:  Normal)   Pulse 74   Temp 98.8 F (37.1 C) (Oral)   Ht 4' 11"  (1.499 m)   Wt 135 lb (61.2 kg)   SpO2 98%   BMI 27.27 kg/m  Wt Readings from Last 3 Encounters:  08/30/18 135 lb (61.2 kg)  05/20/18 134 lb 9.6 oz (61.1 kg)  01/17/18 132 lb (59.9 kg)    Physical Exam Vitals signs and nursing note reviewed.  Constitutional:      Appearance: She is well-developed.  HENT:     Head: Normocephalic and atraumatic.  Neck:     Musculoskeletal: Normal range of motion.  Cardiovascular:     Rate and Rhythm: Normal rate and regular rhythm.     Pulses:          Dorsalis pedis pulses are 2+ on the right side and 2+ on the left side.       Posterior tibial pulses are 2+ on the right side and 2+ on the left side.     Heart sounds: Normal heart sounds. No murmur. No friction rub. No gallop.   Pulmonary:     Effort: Pulmonary effort is normal. No tachypnea or respiratory distress.     Breath sounds: Normal breath sounds. No decreased breath sounds, wheezing, rhonchi or rales.  Chest:     Chest wall: No tenderness.  Abdominal:      General: Bowel sounds are normal.     Palpations: Abdomen is soft.  Musculoskeletal: Normal range of motion.  Feet:     Right foot:     Protective Sensation: 10 sites tested. 10 sites sensed.     Skin integrity: Skin integrity normal. No skin breakdown or callus.     Toenail Condition: Right toenails are normal.     Left foot:     Protective Sensation: 10 sites tested. 10 sites sensed.     Skin integrity: Skin integrity normal. No skin breakdown or callus.     Toenail Condition: Left toenails are normal.  Skin:    General: Skin is warm and dry.  Neurological:     Mental Status: She is alert and oriented to person, place, and time.     Coordination: Coordination normal.  Psychiatric:        Behavior: Behavior normal. Behavior is cooperative.        Thought Content: Thought content normal.        Judgment: Judgment normal.        Patient has been counseled extensively about nutrition and exercise as well as the importance of adherence with medications and regular follow-up. The patient was given clear instructions to go to ER or return to medical center if symptoms don't improve, worsen or new problems develop. The patient verbalized understanding.   Follow-up: Return in about 3 months (around 11/30/2018).   Gildardo Pounds, FNP-BC [facility] and [facility], [place]   08/30/2018, 11:37 AM

## 2018-08-31 LAB — LIPID PANEL
Chol/HDL Ratio: 3.3 ratio (ref 0.0–4.4)
Cholesterol, Total: 174 mg/dL (ref 100–199)
HDL: 53 mg/dL (ref >39)
LDL Calculated: 91 mg/dL (ref 0–99)
Triglycerides: 148 mg/dL (ref 0–149)
VLDL Cholesterol Cal: 30 mg/dL (ref 5–40)

## 2018-08-31 LAB — BASIC METABOLIC PANEL
BUN/Creatinine Ratio: 19 (ref 12–28)
BUN: 20 mg/dL (ref 8–27)
CO2: 23 mmol/L (ref 20–29)
Calcium: 10.3 mg/dL (ref 8.7–10.3)
Chloride: 100 mmol/L (ref 96–106)
Creatinine, Ser: 1.03 mg/dL — ABNORMAL HIGH (ref 0.57–1.00)
GFR calc Af Amer: 65 mL/min/{1.73_m2} (ref >59)
GFR calc non Af Amer: 56 mL/min/{1.73_m2} — ABNORMAL LOW (ref >59)
Glucose: 107 mg/dL — ABNORMAL HIGH (ref 65–99)
Potassium: 5.2 mmol/L (ref 3.5–5.2)
Sodium: 142 mmol/L (ref 134–144)

## 2018-08-31 LAB — CBC
Hematocrit: 37.8 % (ref 34.0–46.6)
Hemoglobin: 12 g/dL (ref 11.1–15.9)
MCH: 22.1 pg — ABNORMAL LOW (ref 26.6–33.0)
MCHC: 31.7 g/dL (ref 31.5–35.7)
MCV: 70 fL — ABNORMAL LOW (ref 79–97)
Platelets: 347 10*3/uL (ref 150–450)
RBC: 5.44 x10E6/uL — ABNORMAL HIGH (ref 3.77–5.28)
RDW: 17 % — ABNORMAL HIGH (ref 11.7–15.4)
WBC: 10.3 10*3/uL (ref 3.4–10.8)

## 2018-08-31 LAB — VITAMIN D 25 HYDROXY (VIT D DEFICIENCY, FRACTURES): Vit D, 25-Hydroxy: 35.3 ng/mL (ref 30.0–100.0)

## 2018-08-31 MED FILL — ACCU-CHEK SOFTCLIX LANCETS: 10 days supply | Qty: 200 | Fill #1

## 2018-09-22 MED FILL — LISINOPRIL 40 MG TABLET: 40 | 90 days supply | Qty: 90 | Fill #0

## 2018-09-22 MED FILL — GLIMEPIRIDE 2 MG TABS: 2 | 90 days supply | Qty: 90 | Fill #0

## 2018-09-22 MED FILL — ATORVASTATIN CALCIUM 40 MG: 40 | 90 days supply | Qty: 90 | Fill #0

## 2018-09-22 MED FILL — HYDROCHLOROTHIAZIDE 25 MG T: 25 | 90 days supply | Qty: 90 | Fill #0

## 2018-09-22 MED FILL — AMLODIPINE BESYLATE 10 MG T: 10 | 90 days supply | Qty: 90 | Fill #0

## 2018-09-22 MED FILL — JANUMET 50-1,000 MG TABLET: 50-1000 | 90 days supply | Qty: 180 | Fill #0

## 2018-10-03 MED FILL — JANUMET 50-1,000 MG TABLET: 50-1000 | 90 days supply | Qty: 180 | Fill #0

## 2018-10-03 MED FILL — GLIMEPIRIDE 2 MG TABS: 2 | 90 days supply | Qty: 90 | Fill #0

## 2018-10-03 MED FILL — LISINOPRIL 40 MG TABLET: 40 | 90 days supply | Qty: 90 | Fill #0

## 2018-10-03 MED FILL — HYDROCHLOROTHIAZIDE 25 MG T: 25 | 90 days supply | Qty: 90 | Fill #0

## 2018-10-03 MED FILL — AMLODIPINE BESYLATE 10 MG T: 10 | 90 days supply | Qty: 90 | Fill #0

## 2018-10-03 MED FILL — ATORVASTATIN CALCIUM 40 MG: 40 | 90 days supply | Qty: 90 | Fill #0

## 2018-11-01 ENCOUNTER — Inpatient Hospital Stay (HOSPITAL_COMMUNITY)
Admission: EM | Admit: 2018-11-01 | Discharge: 2018-11-15 | DRG: 870 | Disposition: A | Discharge status: Rehabilitation Facility | Payer: Medicare Other | Attending: Internal Medicine | Admitting: Internal Medicine

## 2018-11-01 ENCOUNTER — Emergency Department (HOSPITAL_COMMUNITY): Payer: Medicare Other

## 2018-11-01 ENCOUNTER — Encounter (HOSPITAL_COMMUNITY): Payer: Self-pay

## 2018-11-01 ENCOUNTER — Ambulatory Visit (INDEPENDENT_AMBULATORY_CARE_PROVIDER_SITE_OTHER)
Admission: EM | Admit: 2018-11-01 | Discharge: 2018-11-01 | Disposition: A | Discharge status: Home / Self Care | Payer: Medicare Other | Source: Home / Self Care

## 2018-11-01 DIAGNOSIS — Z7984 Long term (current) use of oral hypoglycemic drugs: Secondary | ICD-10-CM

## 2018-11-01 DIAGNOSIS — E785 Hyperlipidemia, unspecified: Secondary | ICD-10-CM | POA: Diagnosis present

## 2018-11-01 DIAGNOSIS — I6340 Cerebral infarction due to embolism of unspecified cerebral artery: Secondary | ICD-10-CM | POA: Diagnosis not present

## 2018-11-01 DIAGNOSIS — B9620 Unspecified Escherichia coli [E. coli] as the cause of diseases classified elsewhere: Secondary | ICD-10-CM | POA: Diagnosis not present

## 2018-11-01 DIAGNOSIS — J9601 Acute respiratory failure with hypoxia: Secondary | ICD-10-CM | POA: Diagnosis present

## 2018-11-01 DIAGNOSIS — E872 Acidosis: Secondary | ICD-10-CM | POA: Diagnosis present

## 2018-11-01 DIAGNOSIS — I6310 Cerebral infarction due to embolism of unspecified precerebral artery: Secondary | ICD-10-CM | POA: Diagnosis not present

## 2018-11-01 DIAGNOSIS — I69393 Ataxia following cerebral infarction: Secondary | ICD-10-CM | POA: Diagnosis not present

## 2018-11-01 DIAGNOSIS — E1165 Type 2 diabetes mellitus with hyperglycemia: Secondary | ICD-10-CM | POA: Diagnosis present

## 2018-11-01 DIAGNOSIS — A4151 Sepsis due to Escherichia coli [E. coli]: Secondary | ICD-10-CM | POA: Diagnosis present

## 2018-11-01 DIAGNOSIS — K5900 Constipation, unspecified: Secondary | ICD-10-CM | POA: Diagnosis present

## 2018-11-01 DIAGNOSIS — R011 Cardiac murmur, unspecified: Secondary | ICD-10-CM | POA: Diagnosis not present

## 2018-11-01 DIAGNOSIS — I10 Essential (primary) hypertension: Secondary | ICD-10-CM | POA: Diagnosis present

## 2018-11-01 DIAGNOSIS — R7881 Bacteremia: Secondary | ICD-10-CM | POA: Diagnosis present

## 2018-11-01 DIAGNOSIS — M542 Cervicalgia: Secondary | ICD-10-CM | POA: Diagnosis not present

## 2018-11-01 DIAGNOSIS — N390 Urinary tract infection, site not specified: Secondary | ICD-10-CM | POA: Diagnosis present

## 2018-11-01 DIAGNOSIS — N136 Pyonephrosis: Secondary | ICD-10-CM | POA: Diagnosis present

## 2018-11-01 DIAGNOSIS — I471 Supraventricular tachycardia: Secondary | ICD-10-CM | POA: Diagnosis not present

## 2018-11-01 DIAGNOSIS — R0600 Dyspnea, unspecified: Secondary | ICD-10-CM | POA: Diagnosis not present

## 2018-11-01 DIAGNOSIS — D649 Anemia, unspecified: Secondary | ICD-10-CM | POA: Diagnosis not present

## 2018-11-01 DIAGNOSIS — J189 Pneumonia, unspecified organism: Secondary | ICD-10-CM | POA: Diagnosis present

## 2018-11-01 DIAGNOSIS — R6521 Severe sepsis with septic shock: Secondary | ICD-10-CM | POA: Diagnosis present

## 2018-11-01 DIAGNOSIS — I4891 Unspecified atrial fibrillation: Secondary | ICD-10-CM | POA: Diagnosis present

## 2018-11-01 DIAGNOSIS — E119 Type 2 diabetes mellitus without complications: Secondary | ICD-10-CM | POA: Diagnosis present

## 2018-11-01 DIAGNOSIS — I63119 Cerebral infarction due to embolism of unspecified vertebral artery: Secondary | ICD-10-CM | POA: Diagnosis not present

## 2018-11-01 DIAGNOSIS — I69318 Other symptoms and signs involving cognitive functions following cerebral infarction: Secondary | ICD-10-CM | POA: Diagnosis present

## 2018-11-01 DIAGNOSIS — R509 Fever, unspecified: Secondary | ICD-10-CM | POA: Diagnosis not present

## 2018-11-01 DIAGNOSIS — Z20828 Contact with and (suspected) exposure to other viral communicable diseases: Secondary | ICD-10-CM | POA: Diagnosis present

## 2018-11-01 DIAGNOSIS — N12 Tubulo-interstitial nephritis, not specified as acute or chronic: Secondary | ICD-10-CM | POA: Diagnosis present

## 2018-11-01 DIAGNOSIS — Z9911 Dependence on respirator [ventilator] status: Secondary | ICD-10-CM | POA: Diagnosis not present

## 2018-11-01 DIAGNOSIS — N179 Acute kidney failure, unspecified: Secondary | ICD-10-CM | POA: Diagnosis present

## 2018-11-01 DIAGNOSIS — I480 Paroxysmal atrial fibrillation: Secondary | ICD-10-CM | POA: Diagnosis not present

## 2018-11-01 DIAGNOSIS — G9341 Metabolic encephalopathy: Secondary | ICD-10-CM | POA: Diagnosis present

## 2018-11-01 DIAGNOSIS — Z7982 Long term (current) use of aspirin: Secondary | ICD-10-CM | POA: Diagnosis not present

## 2018-11-01 DIAGNOSIS — E876 Hypokalemia: Secondary | ICD-10-CM | POA: Diagnosis present

## 2018-11-01 DIAGNOSIS — G0490 Encephalitis and encephalomyelitis, unspecified: Secondary | ICD-10-CM | POA: Diagnosis not present

## 2018-11-01 DIAGNOSIS — A419 Sepsis, unspecified organism: Secondary | ICD-10-CM | POA: Diagnosis present

## 2018-11-01 DIAGNOSIS — Z978 Presence of other specified devices: Secondary | ICD-10-CM

## 2018-11-01 DIAGNOSIS — F17210 Nicotine dependence, cigarettes, uncomplicated: Secondary | ICD-10-CM | POA: Diagnosis present

## 2018-11-01 DIAGNOSIS — I21A1 Myocardial infarction type 2: Secondary | ICD-10-CM | POA: Diagnosis present

## 2018-11-01 DIAGNOSIS — Z9289 Personal history of other medical treatment: Secondary | ICD-10-CM

## 2018-11-01 DIAGNOSIS — E559 Vitamin D deficiency, unspecified: Secondary | ICD-10-CM | POA: Diagnosis present

## 2018-11-01 DIAGNOSIS — D6959 Other secondary thrombocytopenia: Secondary | ICD-10-CM | POA: Diagnosis present

## 2018-11-01 DIAGNOSIS — J810 Acute pulmonary edema: Secondary | ICD-10-CM | POA: Diagnosis not present

## 2018-11-01 DIAGNOSIS — G9340 Encephalopathy, unspecified: Secondary | ICD-10-CM | POA: Diagnosis not present

## 2018-11-01 DIAGNOSIS — Z79899 Other long term (current) drug therapy: Secondary | ICD-10-CM

## 2018-11-01 DIAGNOSIS — J9600 Acute respiratory failure, unspecified whether with hypoxia or hypercapnia: Secondary | ICD-10-CM | POA: Diagnosis present

## 2018-11-01 DIAGNOSIS — R4182 Altered mental status, unspecified: Secondary | ICD-10-CM

## 2018-11-01 DIAGNOSIS — R140 Abdominal distension (gaseous): Secondary | ICD-10-CM

## 2018-11-01 DIAGNOSIS — I639 Cerebral infarction, unspecified: Secondary | ICD-10-CM

## 2018-11-01 DIAGNOSIS — N10 Acute tubulo-interstitial nephritis: Secondary | ICD-10-CM | POA: Diagnosis not present

## 2018-11-01 DIAGNOSIS — Z794 Long term (current) use of insulin: Secondary | ICD-10-CM | POA: Diagnosis not present

## 2018-11-01 DIAGNOSIS — Z452 Encounter for adjustment and management of vascular access device: Secondary | ICD-10-CM

## 2018-11-01 DIAGNOSIS — Z7901 Long term (current) use of anticoagulants: Secondary | ICD-10-CM | POA: Diagnosis not present

## 2018-11-01 DIAGNOSIS — R1310 Dysphagia, unspecified: Secondary | ICD-10-CM | POA: Diagnosis present

## 2018-11-01 DIAGNOSIS — M7989 Other specified soft tissue disorders: Secondary | ICD-10-CM | POA: Diagnosis not present

## 2018-11-01 DIAGNOSIS — Z789 Other specified health status: Secondary | ICD-10-CM | POA: Diagnosis not present

## 2018-11-01 LAB — POCT I-STAT 7, (LYTES, BLD GAS, ICA,H+H)
Acid-base deficit: 9 mmol/L — ABNORMAL HIGH (ref 0.0–2.0)
Bicarbonate: 13.9 mmol/L — ABNORMAL LOW (ref 20.0–28.0)
Calcium, Ion: 1.15 mmol/L (ref 1.15–1.40)
HCT: 28 % — ABNORMAL LOW (ref 36.0–46.0)
Hemoglobin: 9.5 g/dL — ABNORMAL LOW (ref 12.0–15.0)
O2 Saturation: 91 %
Patient temperature: 102.5
Potassium: 3.7 mmol/L (ref 3.5–5.1)
Sodium: 137 mmol/L (ref 135–145)
TCO2: 15 mmol/L — ABNORMAL LOW (ref 22–32)
pCO2 arterial: 24.1 mmHg — ABNORMAL LOW (ref 32.0–48.0)
pH, Arterial: 7.379 (ref 7.350–7.450)
pO2, Arterial: 69 mmHg — ABNORMAL LOW (ref 83.0–108.0)

## 2018-11-01 LAB — CBC WITH DIFFERENTIAL/PLATELET
Abs Immature Granulocytes: 0 10*3/uL (ref 0.00–0.07)
Basophils Absolute: 0 10*3/uL (ref 0.0–0.1)
Basophils Relative: 0 %
Eosinophils Absolute: 0 10*3/uL (ref 0.0–0.5)
Eosinophils Relative: 0 %
HCT: 33.6 % — ABNORMAL LOW (ref 36.0–46.0)
Hemoglobin: 11 g/dL — ABNORMAL LOW (ref 12.0–15.0)
Lymphocytes Relative: 9 %
Lymphs Abs: 1.5 10*3/uL (ref 0.7–4.0)
MCH: 22.7 pg — ABNORMAL LOW (ref 26.0–34.0)
MCHC: 32.7 g/dL (ref 30.0–36.0)
MCV: 69.3 fL — ABNORMAL LOW (ref 80.0–100.0)
Monocytes Absolute: 0 10*3/uL — ABNORMAL LOW (ref 0.1–1.0)
Monocytes Relative: 0 %
Neutro Abs: 15.5 10*3/uL — ABNORMAL HIGH (ref 1.7–7.7)
Neutrophils Relative %: 91 %
Platelets: 179 10*3/uL (ref 150–400)
RBC: 4.85 MIL/uL (ref 3.87–5.11)
RDW: 15.1 % (ref 11.5–15.5)
WBC: 17 10*3/uL — ABNORMAL HIGH (ref 4.0–10.5)
nRBC: 0 % (ref 0.0–0.2)
nRBC: 0 /100 WBC

## 2018-11-01 LAB — URINALYSIS, ROUTINE W REFLEX MICROSCOPIC
Bilirubin Urine: NEGATIVE
Glucose, UA: NEGATIVE mg/dL
Ketones, ur: NEGATIVE mg/dL
Nitrite: POSITIVE — AB
Protein, ur: 100 mg/dL — AB
Specific Gravity, Urine: 1.023 (ref 1.005–1.030)
WBC, UA: >50 WBC/hpf — ABNORMAL HIGH (ref 0–5)
pH: 5 (ref 5.0–8.0)

## 2018-11-01 LAB — TROPONIN I (HIGH SENSITIVITY)
Troponin I (High Sensitivity): 334 ng/L (ref <18)
Troponin I (High Sensitivity): 877 ng/L (ref <18)

## 2018-11-01 LAB — SARS CORONAVIRUS 2 BY RT PCR (HOSPITAL ORDER, PERFORMED IN ~~LOC~~ HOSPITAL LAB): SARS Coronavirus 2: NEGATIVE

## 2018-11-01 LAB — COMPREHENSIVE METABOLIC PANEL
ALT: 33 U/L (ref 0–44)
AST: 37 U/L (ref 15–41)
Albumin: 3.6 g/dL (ref 3.5–5.0)
Alkaline Phosphatase: 68 U/L (ref 38–126)
Anion gap: 15 (ref 5–15)
BUN: 29 mg/dL — ABNORMAL HIGH (ref 8–23)
CO2: 19 mmol/L — ABNORMAL LOW (ref 22–32)
Calcium: 9.2 mg/dL (ref 8.9–10.3)
Chloride: 101 mmol/L (ref 98–111)
Creatinine, Ser: 1.67 mg/dL — ABNORMAL HIGH (ref 0.44–1.00)
GFR calc Af Amer: 36 mL/min — ABNORMAL LOW (ref >60)
GFR calc non Af Amer: 31 mL/min — ABNORMAL LOW (ref >60)
Glucose, Bld: 196 mg/dL — ABNORMAL HIGH (ref 70–99)
Potassium: 3.4 mmol/L — ABNORMAL LOW (ref 3.5–5.1)
Sodium: 135 mmol/L (ref 135–145)
Total Bilirubin: 1 mg/dL (ref 0.3–1.2)
Total Protein: 7.6 g/dL (ref 6.5–8.1)

## 2018-11-01 LAB — MAGNESIUM: Magnesium: 1.2 mg/dL — ABNORMAL LOW (ref 1.7–2.4)

## 2018-11-01 LAB — GLUCOSE, CAPILLARY: Glucose-Capillary: 177 mg/dL — ABNORMAL HIGH (ref 70–99)

## 2018-11-01 LAB — BRAIN NATRIURETIC PEPTIDE: B Natriuretic Peptide: 252.3 pg/mL — ABNORMAL HIGH (ref 0.0–100.0)

## 2018-11-01 LAB — LACTIC ACID, PLASMA: Lactic Acid, Venous: 4.5 mmol/L (ref 0.5–1.9)

## 2018-11-01 MED ORDER — NOREPINEPHRINE 4 MG/250ML-% IV SOLN
2.0000 ug/min | INTRAVENOUS | Status: DC
  Administered 2018-11-02: 10 ug/min via INTRAVENOUS
  Administered 2018-11-02: 2 ug/min via INTRAVENOUS
  Administered 2018-11-03: 13:00:00 10 ug/min via INTRAVENOUS
  Administered 2018-11-03: 13 ug/min via INTRAVENOUS
  Administered 2018-11-03: 07:00:00 12 ug/min via INTRAVENOUS
  Administered 2018-11-03: 21:00:00 9 ug/min via INTRAVENOUS
  Filled 2018-11-01: qty 250
  Filled 2018-11-01: qty 500
  Filled 2018-11-01 (×3): qty 250

## 2018-11-01 MED ORDER — SODIUM CHLORIDE 0.9 % IV BOLUS
1000.0000 mL | Freq: Once | INTRAVENOUS | Status: AC
  Administered 2018-11-01: 1000 mL via INTRAVENOUS

## 2018-11-01 MED ORDER — LACTATED RINGERS IV SOLN
INTRAVENOUS | Status: DC
  Administered 2018-11-01: 23:00:00 via INTRAVENOUS

## 2018-11-01 MED ORDER — INSULIN ASPART 100 UNIT/ML ~~LOC~~ SOLN: 0.0000 [IU] | Freq: Three times a day (TID) | SUBCUTANEOUS | Status: DC

## 2018-11-01 MED ORDER — IOHEXOL 350 MG/ML SOLN
64.0000 mL | Freq: Once | INTRAVENOUS | Status: AC | PRN
  Administered 2018-11-01: 64 mL via INTRAVENOUS

## 2018-11-01 MED ORDER — PHENYLEPHRINE HCL-NACL 10-0.9 MG/250ML-% IV SOLN: 0.0000 ug/min | INTRAVENOUS | Status: DC

## 2018-11-01 MED ORDER — SODIUM CHLORIDE 0.9% FLUSH
3.0000 mL | Freq: Two times a day (BID) | INTRAVENOUS | Status: DC
  Administered 2018-11-03 – 2018-11-15 (×20): 3 mL via INTRAVENOUS

## 2018-11-01 MED ORDER — ACETAMINOPHEN 325 MG PO TABS
650.0000 mg | ORAL_TABLET | ORAL | Status: DC | PRN
  Filled 2018-11-01: qty 2

## 2018-11-01 MED ORDER — VANCOMYCIN HCL 10 G IV SOLR
1500.0000 mg | Freq: Once | INTRAVENOUS | Status: AC
  Administered 2018-11-01: 1500 mg via INTRAVENOUS
  Filled 2018-11-01: qty 1500

## 2018-11-01 MED ORDER — SODIUM CHLORIDE 0.9 % IV SOLN
250.0000 mL | INTRAVENOUS | Status: DC
  Administered 2018-11-02 – 2018-11-05 (×3): 250 mL via INTRAVENOUS

## 2018-11-01 MED ORDER — ACETAMINOPHEN 325 MG PO TABS
650.0000 mg | ORAL_TABLET | Freq: Once | ORAL | Status: AC
  Administered 2018-11-01: 14:00:00 650 mg via ORAL

## 2018-11-01 MED ORDER — MAGNESIUM SULFATE 50 % IJ SOLN: 2.0000 g | Freq: Once | INTRAMUSCULAR | Status: DC

## 2018-11-01 MED ORDER — IBUPROFEN 400 MG PO TABS
400.0000 mg | ORAL_TABLET | Freq: Once | ORAL | Status: AC
  Administered 2018-11-01: 400 mg via ORAL
  Filled 2018-11-01: qty 1

## 2018-11-01 MED ORDER — ALBUTEROL SULFATE HFA 108 (90 BASE) MCG/ACT IN AERS
4.0000 | INHALATION_SPRAY | Freq: Once | RESPIRATORY_TRACT | Status: AC
  Administered 2018-11-01: 4 via RESPIRATORY_TRACT
  Filled 2018-11-01: qty 6.7

## 2018-11-01 MED ORDER — SODIUM CHLORIDE 0.9 % IV SOLN
INTRAVENOUS | Status: DC
  Administered 2018-11-01: 16:00:00 via INTRAVENOUS

## 2018-11-01 MED ORDER — ACETAMINOPHEN 325 MG PO TABS: 650.0000 mg | ORAL_TABLET | Freq: Four times a day (QID) | ORAL | Status: DC | PRN

## 2018-11-01 MED ORDER — HEPARIN SODIUM (PORCINE) 5000 UNIT/ML IJ SOLN
5000.0000 [IU] | Freq: Three times a day (TID) | INTRAMUSCULAR | Status: DC
  Administered 2018-11-01 – 2018-11-03 (×5): 5000 [IU] via SUBCUTANEOUS
  Filled 2018-11-01 (×5): qty 1

## 2018-11-01 MED ORDER — SODIUM CHLORIDE 0.9 % IV SOLN: 2.0000 g | INTRAVENOUS | Status: DC

## 2018-11-01 MED ORDER — CHLORHEXIDINE GLUCONATE CLOTH 2 % EX PADS
6.0000 | MEDICATED_PAD | Freq: Every day | CUTANEOUS | Status: DC
  Administered 2018-11-02 – 2018-11-14 (×12): 6 via TOPICAL

## 2018-11-01 MED ORDER — LACTATED RINGERS IV BOLUS
2000.0000 mL | Freq: Once | INTRAVENOUS | Status: AC
  Administered 2018-11-01: 2000 mL via INTRAVENOUS

## 2018-11-01 MED ORDER — SODIUM CHLORIDE 0.9 % IV SOLN
INTRAVENOUS | Status: DC
  Administered 2018-11-01 – 2018-11-03 (×5): via INTRAVENOUS

## 2018-11-01 MED ORDER — ACETAMINOPHEN 325 MG PO TABS
ORAL_TABLET | ORAL | Status: AC
  Filled 2018-11-01: qty 2

## 2018-11-01 MED ORDER — MAGNESIUM SULFATE 2 GM/50ML IV SOLN
2.0000 g | Freq: Once | INTRAVENOUS | Status: AC
  Administered 2018-11-02: 2 g via INTRAVENOUS
  Filled 2018-11-01: qty 50

## 2018-11-01 MED ORDER — ACETAMINOPHEN 325 MG PO TABS
325.0000 mg | ORAL_TABLET | Freq: Once | ORAL | Status: AC
  Administered 2018-11-01: 325 mg via ORAL
  Filled 2018-11-01: qty 1

## 2018-11-01 MED ORDER — LACTATED RINGERS IV BOLUS
1000.0000 mL | Freq: Once | INTRAVENOUS | Status: AC
  Administered 2018-11-01: 1000 mL via INTRAVENOUS

## 2018-11-01 MED ORDER — ENOXAPARIN SODIUM 40 MG/0.4ML ~~LOC~~ SOLN: 40.0000 mg | SUBCUTANEOUS | Status: DC

## 2018-11-01 MED ORDER — ACETAMINOPHEN 650 MG RE SUPP: 650.0000 mg | Freq: Four times a day (QID) | RECTAL | Status: DC | PRN

## 2018-11-01 MED ORDER — INSULIN ASPART 100 UNIT/ML ~~LOC~~ SOLN: 0.0000 [IU] | Freq: Every day | SUBCUTANEOUS | Status: DC

## 2018-11-01 MED ORDER — VANCOMYCIN HCL 10 G IV SOLR: 1250.0000 mg | INTRAVENOUS | Status: DC

## 2018-11-01 MED ORDER — IPRATROPIUM-ALBUTEROL 0.5-2.5 (3) MG/3ML IN SOLN
3.0000 mL | Freq: Once | RESPIRATORY_TRACT | Status: AC
  Administered 2018-11-01: 3 mL via RESPIRATORY_TRACT
  Filled 2018-11-01: qty 3

## 2018-11-01 MED ORDER — POTASSIUM CHLORIDE CRYS ER 20 MEQ PO TBCR: 40.0000 meq | EXTENDED_RELEASE_TABLET | Freq: Once | ORAL | Status: DC

## 2018-11-01 MED ORDER — ONDANSETRON HCL 4 MG/2ML IJ SOLN
4.0000 mg | Freq: Four times a day (QID) | INTRAMUSCULAR | Status: DC | PRN
  Administered 2018-11-10: 4 mg via INTRAVENOUS
  Filled 2018-11-01: qty 2

## 2018-11-01 MED ORDER — SODIUM CHLORIDE 0.9 % IV SOLN
2.0000 g | Freq: Once | INTRAVENOUS | Status: AC
  Administered 2018-11-01: 2 g via INTRAVENOUS
  Filled 2018-11-01: qty 2

## 2018-11-01 NOTE — Discharge Instructions (Addendum)
Go to the ED

## 2018-11-01 NOTE — Progress Notes (Signed)
RT placed patient on BIPAP. Patient is tolerating BIPAP well at this time. RT will monitor as needed.

## 2018-11-01 NOTE — ED Notes (Signed)
Report called to charge RN Roselyn Reef by Loura Halt NP to discuss patients status. Patient wheeled to the ED by this RN.

## 2018-11-01 NOTE — Progress Notes (Signed)
Patient transported on BIPAP from ED room 17 to CT and back with no complications.

## 2018-11-01 NOTE — ED Notes (Signed)
SDU    Paged admitting DR to RN Sherrine Maples

## 2018-11-01 NOTE — ED Notes (Signed)
ED TO INPATIENT HANDOFF REPORT  ED Nurse Name and Phone #: Lenord Carbo [phone]  S Name/Age/Gender Aimee Parker 68 y.o. female Room/Bed: 017C/017C  Code Status   Code Status: Full Code  Home/SNF/Other Home Patient oriented to: self, place, time and situation Is this baseline? Yes   Triage Complete: Triage complete  Chief Complaint FEVER  Triage Note Pt BIB by Family after pt had a fever and feeling weak since yesterday morning.    Allergies No Known Allergies  Level of Care/Admitting Diagnosis ED Disposition    ED Disposition Condition Comment   Admit  Hospital Area: [facility] [[id number]]  Level of Care: ICU [6]  Covid Evaluation: Confirmed COVID Negative  Diagnosis: Sepsis [facility]) [[id number]]  Admitting Physician: Carin Hock [[id number]]  Attending Physician: Carin Hock [[id number]]  Estimated length of stay: 3 - 4 days  Certification:: I certify this patient will need inpatient services for at least 2 midnights  PT Class (Do Not Modify): Inpatient [101]  PT Acc Code (Do Not Modify): Private [1]       B Medical/Surgery History Past Medical History:  Diagnosis Date  . Diabetes mellitus without complication (HCC)   . Hypertension   . Vitamin D deficiency    Past Surgical History:  Procedure Laterality Date  . EYE SURGERY     Left eye surgery  . PARS PLANA VITRECTOMY Right 05/13/2017   Procedure: PARS PLANA VITRECTOMY 25 GAUGE FOR ENDOPHTHALMITIS;  Surgeon: Edmon Crape, MD;  Location: [facility] OR;  Service: Ophthalmology;  Laterality: Right;     A IV Location/Drains/Wounds Patient Lines/Drains/Airways Status   Active Line/Drains/Airways    Name:   Placement date:   Placement time:   Site:   Days:   Peripheral IV 11/01/18 Right Antecubital   11/01/18    2006    Antecubital   less than 1   Incision (Closed) 05/13/17 Eye Right   05/13/17    1950     537          Intake/Output Last 24 hours  Intake/Output Summary (Last 24  hours) at 11/01/2018 2313 Last data filed at 11/01/2018 2308 Gross per 24 hour  Intake 1800 ml  Output -  Net 1800 ml    Labs/Imaging Results for orders placed or performed during the hospital encounter of 11/01/18 (from the past 48 hour(s))  CBC with Differential     Status: Abnormal   Collection Time: 11/01/18  3:35 PM  Result Value Ref Range   WBC 17.0 (H) 4.0 - 10.5 K/uL   RBC 4.85 3.87 - 5.11 MIL/uL   Hemoglobin 11.0 (L) 12.0 - 15.0 g/dL   HCT 48.1 (L) 85.6 - 31.4 %   MCV 69.3 (L) 80.0 - 100.0 fL   MCH 22.7 (L) 26.0 - 34.0 pg   MCHC 32.7 30.0 - 36.0 g/dL   RDW 97.0 26.3 - 78.5 %   Platelets 179 150 - 400 K/uL   nRBC 0.0 0.0 - 0.2 %   Neutrophils Relative % 91 %   Neutro Abs 15.5 (H) 1.7 - 7.7 K/uL   Lymphocytes Relative 9 %   Lymphs Abs 1.5 0.7 - 4.0 K/uL   Monocytes Relative 0 %   Monocytes Absolute 0.0 (L) 0.1 - 1.0 K/uL   Eosinophils Relative 0 %   Eosinophils Absolute 0.0 0.0 - 0.5 K/uL   Basophils Relative 0 %   Basophils Absolute 0.0 0.0 - 0.1 K/uL   WBC Morphology See Note  Comment: Mild Left Shift. 1 to 5% Metas and Myelos, Occ Pro Noted.   nRBC 0 0 /100 WBC   Abs Immature Granulocytes 0.00 0.00 - 0.07 K/uL   Polychromasia PRESENT     Comment: Performed at [facility], [address], [place], [place] [address]  Comprehensive metabolic panel     Status: Abnormal   Collection Time: 11/01/18  3:35 PM  Result Value Ref Range   Sodium 135 135 - 145 mmol/L   Potassium 3.4 (L) 3.5 - 5.1 mmol/L   Chloride 101 98 - 111 mmol/L   CO2 19 (L) 22 - 32 mmol/L   Glucose, Bld 196 (H) 70 - 99 mg/dL   BUN 29 (H) 8 - 23 mg/dL   Creatinine, Ser 6.04 (H) 0.44 - 1.00 mg/dL   Calcium 9.2 8.9 - 54.0 mg/dL   Total Protein 7.6 6.5 - 8.1 g/dL   Albumin 3.6 3.5 - 5.0 g/dL   AST 37 15 - 41 U/L   ALT 33 0 - 44 U/L   Alkaline Phosphatase 68 38 - 126 U/L   Total Bilirubin 1.0 0.3 - 1.2 mg/dL   GFR calc non Af Amer 31 (L) >60 mL/min   GFR calc Af Amer 36 (L) >60 mL/min    Anion gap 15 5 - 15    Comment: Performed at [facility], [address] [place], [place] [address]  SARS Coronavirus 2 by RT PCR (hospital order, performed in [facility] hospital lab) Nasopharyngeal Nasopharyngeal Swab     Status: None   Collection Time: 11/01/18  3:52 PM   Specimen: Nasopharyngeal Swab  Result Value Ref Range   SARS Coronavirus 2 NEGATIVE NEGATIVE    Comment: (NOTE) If result is NEGATIVE SARS-CoV-2 target nucleic acids are NOT DETECTED. The SARS-CoV-2 RNA is generally detectable in upper and lower  respiratory specimens during the acute phase of infection. The lowest  concentration of SARS-CoV-2 viral copies this assay can detect is 250  copies / mL. A negative result does not preclude SARS-CoV-2 infection  and should not be used as the sole basis for treatment or other  patient management decisions.  A negative result may occur with  improper specimen collection / handling, submission of specimen other  than nasopharyngeal swab, presence of viral mutation(s) within the  areas targeted by this assay, and inadequate number of viral copies  (<250 copies / mL). A negative result must be combined with clinical  observations, patient history, and epidemiological information. If result is POSITIVE SARS-CoV-2 target nucleic acids are DETECTED. The SARS-CoV-2 RNA is generally detectable in upper and lower  respiratory specimens dur ing the acute phase of infection.  Positive  results are indicative of active infection with SARS-CoV-2.  Clinical  correlation with patient history and other diagnostic information is  necessary to determine patient infection status.  Positive results do  not rule out bacterial infection or co-infection with other viruses. If result is PRESUMPTIVE POSTIVE SARS-CoV-2 nucleic acids MAY BE PRESENT.   A presumptive positive result was obtained on the submitted specimen  and confirmed on repeat testing.  While 2019 novel coronavirus   (SARS-CoV-2) nucleic acids may be present in the submitted sample  additional confirmatory testing may be necessary for epidemiological  and / or clinical management purposes  to differentiate between  SARS-CoV-2 and other Sarbecovirus currently known to infect humans.  If clinically indicated additional testing with an alternate test  methodology [phone]) is advised. The SARS-CoV-2 RNA  is generally  detectable in upper and lower respiratory sp ecimens during the acute  phase of infection. The expected result is Negative. Fact Sheet for Patients:  BoilerBrush.com.cyhttps://www.fda.gov/media/136312/download Fact Sheet for Healthcare Providers: https://pope.com/https://www.fda.gov/media/136313/download This test is not yet approved or cleared by the [place] FDA and has been authorized for detection and/or diagnosis of SARS-CoV-2 by FDA under an Emergency Use Authorization (EUA).  This EUA will remain in effect (meaning this test can be used) for the duration of the COVID-19 declaration under Section 564(b)(1) of the Act, 21 U.S.C. section 360bbb-3(b)(1), unless the authorization is terminated or revoked sooner. Performed at [facility], [address] [place], [place] [address]   Lactic acid, plasma     Status: Abnormal   Collection Time: 11/01/18  4:00 PM  Result Value Ref Range   Lactic Acid, Venous 4.5 (HH) 0.5 - 1.9 mmol/L    Comment: CRITICAL RESULT CALLED TO, READ BACK BY AND VERIFIED WITH: L.Izaiha Lo RN 1631 11/01/2018 MCCORMICK K Performed at [facility], [address] [place], [place] [address]   Urinalysis, Routine w reflex microscopic     Status: Abnormal   Collection Time: 11/01/18  6:34 PM  Result Value Ref Range   Color, Urine YELLOW YELLOW   APPearance HAZY (A) CLEAR   Specific Gravity, Urine 1.023 1.005 - 1.030   pH 5.0 5.0 - 8.0   Glucose, UA NEGATIVE NEGATIVE mg/dL   Hgb urine dipstick MODERATE (A) NEGATIVE   Bilirubin Urine NEGATIVE NEGATIVE   Ketones, ur NEGATIVE  NEGATIVE mg/dL   Protein, ur 130100 (A) NEGATIVE mg/dL   Nitrite POSITIVE (A) NEGATIVE   Leukocytes,Ua MODERATE (A) NEGATIVE   RBC / HPF 6-10 0 - 5 RBC/hpf   WBC, UA >50 (H) 0 - 5 WBC/hpf   Bacteria, UA MANY (A) NONE SEEN   Squamous Epithelial / LPF 0-5 0 - 5   Mucus PRESENT    Non Squamous Epithelial 0-5 (A) NONE SEEN    Comment: Performed at [facility], [address] [place], [place] [address]  Culture, Urine     Status: None (Preliminary result)   Collection Time: 11/01/18  6:34 PM   Specimen: Urine, Random  Result Value Ref Range   Specimen Description URINE, RANDOM    Special Requests      ADDED 2104  11/01/2018 Performed at [facility], [address] [place], [place] [address]    Culture PENDING    Report Status PENDING   Brain natriuretic peptide     Status: Abnormal   Collection Time: 11/01/18  7:53 PM  Result Value Ref Range   B Natriuretic Peptide 252.3 (H) 0.0 - 100.0 pg/mL    Comment: Performed at [facility], [address] [place], [place] [address]  Troponin I (High Sensitivity)     Status: Abnormal   Collection Time: 11/01/18  8:06 PM  Result Value Ref Range   Troponin I (High Sensitivity) 334 (HH) <18 ng/L    Comment: CRITICAL RESULT CALLED TO, READ BACK BY AND VERIFIED WITH: RN M [facility] @ 2101 11/01/18 BY S GEAHEGN (NOTE) Elevated high sensitivity troponin I (hsTnI) values and significant  changes across serial measurements may suggest ACS but many other  chronic and acute conditions are known to elevate hsTnI results.  Refer to the Links section for chest pain algorithms and additional  guidance. Performed at [facility], [address] [place], [place] [address]  I-STAT 7, (LYTES, BLD GAS, ICA, H+H)     Status: Abnormal   Collection Time: 11/01/18  8:39 PM  Result Value Ref Range   pH, Arterial 7.379 7.350 - 7.450   pCO2 arterial 24.1 (L) 32.0 - 48.0 mmHg   pO2, Arterial 69.0 (L) 83.0 - 108.0 mmHg   Bicarbonate 13.9  (L) 20.0 - 28.0 mmol/L   TCO2 15 (L) 22 - 32 mmol/L   O2 Saturation 91.0 %   Acid-base deficit 9.0 (H) 0.0 - 2.0 mmol/L   Sodium 137 135 - 145 mmol/L   Potassium 3.7 3.5 - 5.1 mmol/L   Calcium, Ion 1.15 1.15 - 1.40 mmol/L   HCT 28.0 (L) 36.0 - 46.0 %   Hemoglobin 9.5 (L) 12.0 - 15.0 g/dL   Patient temperature 161.0 F    Collection site RADIAL, ALLEN'S TEST ACCEPTABLE    Drawn by RT    Sample type ARTERIAL   Troponin I (High Sensitivity)     Status: Abnormal   Collection Time: 11/01/18 10:00 PM  Result Value Ref Range   Troponin I (High Sensitivity) 877 (HH) <18 ng/L    Comment: DELTA CHECK NOTED CRITICAL RESULT CALLED TO, READ BACK BY AND VERIFIED WITH: RN L Serigne Kubicek  11/01/18 BY S GESZHEGN (NOTE) Elevated high sensitivity troponin I (hsTnI) values and significant  changes across serial measurements may suggest ACS but many other  chronic and acute conditions are known to elevate hsTnI results.  Refer to the Links section for chest pain algorithms and additional  guidance. Performed at [facility], [address], [place], [place] [address]   Magnesium     Status: Abnormal   Collection Time: 11/01/18 10:00 PM  Result Value Ref Range   Magnesium 1.2 (L) 1.7 - 2.4 mg/dL    Comment: Performed at [facility], [address], [place], [place] [address]   Ct Angio Chest Pe W And/or Wo Contrast  Result Date: 11/01/2018 CLINICAL DATA:  Chest pain, fever nausea EXAM: CT ANGIOGRAPHY CHEST WITH CONTRAST TECHNIQUE: Multidetector CT imaging of the chest was performed using the standard protocol during bolus administration of intravenous contrast. Multiplanar CT image reconstructions and MIPs were obtained to evaluate the vascular anatomy. CONTRAST:  64mL OMNIPAQUE IOHEXOL 350 MG/ML SOLN COMPARISON:  None. FINDINGS: Cardiovascular: There is a optimal opacification of the pulmonary arteries. There is no central,segmental, or subsegmental filling defects within the pulmonary  arteries. There is mild cardiomegaly. Coronary artery calcifications are seen. Aortic valve calcifications are noted. There is normal three-vessel brachiocephalic anatomy without proximal stenosis. Scattered mild atherosclerosis is seen. Mediastinum/Nodes: There are small right hilar and paratracheal lymph nodes seen. Thyroid gland, trachea, and esophagus demonstrate no significant findings. Lungs/Pleura: Patchy airspace areas of consolidation are seen in the bilateral lower lungs. There is ground-glass opacity seen within the upper lung. There is mild air bronchogram seen bilaterally at the posterior lungs. Upper Abdomen: No acute abnormalities present in the visualized portions of the upper abdomen. Musculoskeletal: No chest wall abnormality. No acute or significant osseous findings. Review of the MIP images confirms the above findings. IMPRESSION: 1. Patchy airspace areas of consolidation in the posterior lower lungs with ground-glass opacities in the upper lungs. There are a spectrum of findings in the lungs which can be seen with acute atypical infection (as well as other non-infectious etiologies). In particular, viral pneumonia should be considered in the appropriate clinical setting. 2. No central, segmental, or subsegmental pulmonary embolism. 3. Scattered mediastinal and right  hilar lymph nodes. 4. Coronary artery calcifications 5.  Aortic Atherosclerosis (ICD10-I70.0). Electronically Signed   By: Prudencio Pair M.D.   On: 11/01/2018 21:20   Dg Chest Portable 1 View  Result Date: 11/01/2018 CLINICAL DATA:  Fever and hypoxemia EXAM: PORTABLE CHEST 1 VIEW COMPARISON:  05/24/2006 FINDINGS: Mild cardiomegaly. No consolidation or pleural effusion. Aortic atherosclerosis. No pneumothorax. IMPRESSION: Mild cardiomegaly.  No focal pulmonary opacity. Electronically Signed   By: Donavan Foil M.D.   On: 11/01/2018 15:46    Pending Labs Unresulted Labs (From admission, onward)    Start     Ordered   11/02/18  0500  CBC  Tomorrow morning,   R     11/01/18 2219   11/02/18 0500  Magnesium  Tomorrow morning,   R     11/01/18 2219   11/02/18 5361  Basic metabolic panel  Tomorrow morning,   R     11/01/18 2252   11/01/18 4431  Basic metabolic panel  ONCE - STAT,   STAT     11/01/18 2253   11/01/18 2246  Respiratory Panel by PCR  (Respiratory virus panel with precautions)  Once,   STAT     11/01/18 2245   11/01/18 2244  Lactic acid, plasma  STAT Now then every 3 hours,   R (with STAT occurrences)     11/01/18 2252   11/01/18 2241  CBC  (heparin)  Once,   STAT    Comments: Baseline for heparin therapy IF NOT ALREADY DRAWN.  Notify MD if PLT < 100 K.    11/01/18 2252   11/01/18 2241  Creatinine, serum  (heparin)  Once,   STAT    Comments: Baseline for heparin therapy IF NOT ALREADY DRAWN.    11/01/18 2252   11/01/18 2219  HIV Antibody (routine testing w rflx)  (HIV Antibody (Routine testing w reflex) panel)  Add-on,   AD     11/01/18 2219   11/01/18 1522  Blood culture (routine x 2)  BLOOD CULTURE X 2,   STAT     11/01/18 1522   11/01/18 1521  Lactic acid, plasma  Now then every 2 hours,   STAT     11/01/18 1522          Vitals/Pain Today's Vitals   11/01/18 2245 11/01/18 2300 11/01/18 2304 11/01/18 2305  BP: (!) 93/58 (!) 73/59 (!) 88/54 (!) 84/56  Pulse: (!) 108 (!) 103 (!) 105 (!) 107  Resp: (!) 26 (!) 33 (!) 33 (!) 30  Temp:      TempSrc:      SpO2: 100% 100% 100% 100%  Weight:      Height:        Isolation Precautions Droplet precaution  Medications Medications  sodium chloride flush (NS) 0.9 % injection 3 mL (3 mLs Intravenous Not Given 11/01/18 2250)  insulin aspart (novoLOG) injection 0-9 Units (has no administration in time range)  insulin aspart (novoLOG) injection 0-5 Units (has no administration in time range)  0.9 %  sodium chloride infusion ( Intravenous Stopped 11/01/18 2313)  vancomycin (VANCOCIN) 1,250 mg in sodium chloride 0.9 % 250 mL IVPB (has no  administration in time range)  ceFEPIme (MAXIPIME) 2 g in sodium chloride 0.9 % 100 mL IVPB (has no administration in time range)  heparin injection 5,000 Units (has no administration in time range)  lactated ringers infusion (has no administration in time range)  acetaminophen (TYLENOL) tablet 650 mg (has no administration in time range)  ondansetron (ZOFRAN) injection 4 mg (has no administration in time range)  potassium chloride SA (KLOR-CON) CR tablet 40 mEq (40 mEq Oral Not Given 11/01/18 2308)  acetaminophen (TYLENOL) tablet 325 mg (325 mg Oral Given 11/01/18 1533)  ceFEPIme (MAXIPIME) 2 g in sodium chloride 0.9 % 100 mL IVPB (0 g Intravenous Stopped 11/01/18 1736)  vancomycin (VANCOCIN) 1,500 mg in sodium chloride 0.9 % 500 mL IVPB (0 mg Intravenous Stopped 11/01/18 1954)  lactated ringers bolus 2,000 mL (0 mLs Intravenous Stopped 11/01/18 1901)  ibuprofen (ADVIL) tablet 400 mg (400 mg Oral Given 11/01/18 1848)  albuterol (VENTOLIN HFA) 108 (90 Base) MCG/ACT inhaler 4 puff (4 puffs Inhalation Given 11/01/18 1848)  sodium chloride 0.9 % bolus 1,000 mL (0 mLs Intravenous Stopped 11/01/18 2005)  ipratropium-albuterol (DUONEB) 0.5-2.5 (3) MG/3ML nebulizer solution 3 mL (3 mLs Nebulization Given 11/01/18 2002)  iohexol (OMNIPAQUE) 350 MG/ML injection 64 mL (64 mLs Intravenous Contrast Given 11/01/18 2100)  lactated ringers bolus 1,000 mL (0 mLs Intravenous Stopped 11/01/18 2308)    Mobility walks with device     Focused Assessments Cardiac Assessment Handoff:    No results found for: CKTOTAL, CKMB, CKMBINDEX, TROPONINI No results found for: DDIMER Does the Patient currently have chest pain? No   , Pulmonary Assessment Handoff:  Lung sounds: Bilateral Breath Sounds: Clear, Diminished O2 Device: Bi-PAP O2 Flow Rate (L/min): 15 L/min      R Recommendations: See Admitting Provider Note  Report given to:   Additional Notes: N/A

## 2018-11-01 NOTE — ED Provider Notes (Signed)
[place]    CSN: [id number] Arrival date & time: 11/01/18  1341      History   Chief Complaint Chief Complaint  Patient presents with  . Fever    HPI Aimee Parker is a 68 y.o. female.   Per husband, patient has fever, abdominal pain, nausea, fatigue since yesterday.    The history is provided by the patient and the spouse. A language interpreter was used.    Past Medical History:  Diagnosis Date  . Diabetes mellitus without complication ([place])   . Hypertension   . Vitamin D deficiency     Patient Active Problem List   Diagnosis Date Noted  . Endophthalmitis, acute 05/13/2017  . Healthcare maintenance 09/09/2016  . DM (diabetes mellitus) type 2, uncontrolled, with ketoacidosis ([place]) 05/17/2014  . Heart murmur 12/14/2013  . Cough 07/06/2013  . Hypertriglyceridemia 07/06/2013  . Type 2 diabetes mellitus without complication, with long-term current use of insulin ([place]) 07/06/2013  . Essential hypertension, benign 12/22/2012  . Hyperglycemia 11/17/2012  . Hypertension, uncontrolled 11/17/2012    Past Surgical History:  Procedure Laterality Date  . EYE SURGERY     Left eye surgery  . PARS PLANA VITRECTOMY Right 05/13/2017   Procedure: PARS PLANA VITRECTOMY 25 GAUGE FOR ENDOPHTHALMITIS;  Surgeon: Hurman Horn, MD;  Location: [facility];  Service: Ophthalmology;  Laterality: Right;    OB History   No obstetric history on file.      Home Medications    Prior to Admission medications   Medication Sig Start Date End Date Taking? Authorizing Provider  Accu-Chek Softclix Lancets lancets Use as instructed. Check blood glucose levels twice per day by fingerstick 07/11/18   Gildardo Pounds, NP  amLODipine (NORVASC) 10 MG tablet Take 1 tablet (10 mg total) by mouth daily. 08/30/18   Gildardo Pounds, NP  atorvastatin (LIPITOR) 40 MG tablet Take 1 tablet (40 mg total) by mouth daily. 08/30/18   Gildardo Pounds, NP  Blood Glucose Monitoring Suppl (ACCU-CHEK  AVIVA PLUS) w/Device KIT 1 each by Does not apply route 3 (three) times daily. METER STOPPED WORKING 07/09/17   Gildardo Pounds, NP  glimepiride (AMARYL) 2 MG tablet Take 1 tablet (2 mg total) by mouth daily before breakfast. 08/30/18 11/28/18  Gildardo Pounds, NP  glucose blood (ACCU-CHEK AVIVA) test strip Use as instructed. Check blood glucose levels twice per day by fingerstick 07/11/18   Gildardo Pounds, NP  glucose blood test strip Provide patient with insurance approved strips. Use as instructed. Inject into the skin once daily. 08/30/18   Gildardo Pounds, NP  hydrochlorothiazide (HYDRODIURIL) 25 MG tablet Take 1 tablet (25 mg total) by mouth daily. 08/30/18   Gildardo Pounds, NP  lisinopril (ZESTRIL) 40 MG tablet Take 1 tablet (40 mg total) by mouth daily. 08/30/18   Gildardo Pounds, NP  sitaGLIPtin-metformin (JANUMET) 50-1000 MG tablet Take 1 tablet by mouth 2 (two) times daily with a meal. 08/30/18   Gildardo Pounds, NP  Vitamin D, Ergocalciferol, (DRISDOL) 1.25 MG (50000 UT) CAPS capsule Take 1 capsule (50,000 Units total) by mouth every 7 (seven) days. 01/17/18   Gildardo Pounds, NP    Family History Family History  Problem Relation Age of Onset  . Hypertension Mother   . Hypertension Father     Social History Social History   Tobacco Use  . Smoking status: Never Smoker  . Smokeless tobacco: Never Used  Substance Use Topics  .  Alcohol use: No  . Drug use: No     Allergies   Patient has no known allergies.   Review of Systems Review of Systems  Constitutional: Positive for fatigue and fever.  Gastrointestinal: Positive for abdominal pain. Negative for diarrhea and vomiting.  Skin: Negative for color change and rash.  All other systems reviewed and are negative.    Physical Exam Triage Vital Signs ED Triage Vitals  Enc Vitals Group     BP      Pulse      Resp      Temp      Temp src      SpO2      Weight      Height      Head Circumference      Peak Flow       Pain Score      Pain Loc      Pain Edu?      Excl. in [place]?    No data found.  Updated Vital Signs BP (!) 124/55   Pulse (!) 106   Temp (!) 103.2 F (39.6 C)   Resp (!) 26   SpO2 95%   Visual Acuity Right Eye Distance:   Left Eye Distance:   Bilateral Distance:    Right Eye Near:   Left Eye Near:    Bilateral Near:     Physical Exam Vitals signs and nursing note reviewed.  Constitutional:      General: She is not in acute distress.    Appearance: She is well-developed.  HENT:     Head: Normocephalic and atraumatic.  Eyes:     Conjunctiva/sclera: Conjunctivae normal.  Neck:     Musculoskeletal: Neck supple.  Cardiovascular:     Rate and Rhythm: Normal rate and regular rhythm.     Heart sounds: No murmur.  Pulmonary:     Effort: Pulmonary effort is normal. No respiratory distress.     Breath sounds: Normal breath sounds. No wheezing or rhonchi.  Abdominal:     General: Bowel sounds are normal.     Palpations: Abdomen is soft.     Tenderness: There is no abdominal tenderness. There is no guarding or rebound.  Skin:    General: Skin is warm and dry.  Neurological:     Mental Status: She is alert.     Comments: Not responsive verbally during exam; will open eyes when touched.       UC Treatments / Results  Labs (all labs ordered are listed, but only abnormal results are displayed) Labs Reviewed  GLUCOSE, CAPILLARY - Abnormal; Notable for the following components:      Result Value   Glucose-Capillary 177 (*)    All other components within normal limits  CBG MONITORING, ED    EKG   Radiology No results found.  Procedures Procedures (including critical care time)  Medications Ordered in UC Medications  acetaminophen (TYLENOL) tablet 650 mg (650 mg Oral Given 11/01/18 1428)  acetaminophen (TYLENOL) 325 MG tablet (has no administration in time range)    Initial Impression / Assessment and Plan / UC Course  I have reviewed the triage vital  signs and the nursing notes.  Pertinent labs & imaging results that were available during my care of the patient were reviewed by me and considered in my medical decision making (see chart for details).    Fever, altered mental status.  Temp 103.2, decreased responsiveness, tachycardic.  Sending to emergency department for evaluation.  Final Clinical Impressions(s) / UC Diagnoses   Final diagnoses:  Altered mental status, unspecified altered mental status type  Fever, unspecified fever cause     Discharge Instructions     Go to the ED.    ED Prescriptions    None     PDMP not reviewed this encounter.   Sharion Balloon, NP 11/01/18 1447

## 2018-11-01 NOTE — ED Provider Notes (Signed)
[facility] EMERGENCY DEPARTMENT Provider Note   CSN: [id number] Arrival date & time: 11/01/18  1459     History   Chief Complaint Chief Complaint  Patient presents with  . Fever    HPI Aimee Parker is a 68 y.o. female.     HPI   68 year old female with fever.  Onset of symptoms yesterday.  Fatigued, nauseated and short of breath.  Denies any acute pain to me.  No sick contacts that she is aware of.  No vomiting or diarrhea.  There is a language barrier.  Interpreter service utilized as well as a family member at bedside.  Past Medical History:  Diagnosis Date  . Diabetes mellitus without complication ([place])   . Hypertension   . Vitamin D deficiency     Patient Active Problem List   Diagnosis Date Noted  . Endophthalmitis, acute 05/13/2017  . Healthcare maintenance 09/09/2016  . DM (diabetes mellitus) type 2, uncontrolled, with ketoacidosis ([place]) 05/17/2014  . Heart murmur 12/14/2013  . Cough 07/06/2013  . Hypertriglyceridemia 07/06/2013  . Type 2 diabetes mellitus without complication, with long-term current use of insulin ([place]) 07/06/2013  . Essential hypertension, benign 12/22/2012  . Hyperglycemia 11/17/2012  . Hypertension, uncontrolled 11/17/2012    Past Surgical History:  Procedure Laterality Date  . EYE SURGERY     Left eye surgery  . PARS PLANA VITRECTOMY Right 05/13/2017   Procedure: PARS PLANA VITRECTOMY 25 GAUGE FOR ENDOPHTHALMITIS;  Surgeon: Hurman Horn, MD;  Location: [facility];  Service: Ophthalmology;  Laterality: Right;     OB History   No obstetric history on file.      Home Medications    Prior to Admission medications   Medication Sig Start Date End Date Taking? Authorizing Provider  Accu-Chek Softclix Lancets lancets Use as instructed. Check blood glucose levels twice per day by fingerstick 07/11/18   Gildardo Pounds, NP  amLODipine (NORVASC) 10 MG tablet Take 1 tablet (10 mg total) by mouth daily. 08/30/18    Gildardo Pounds, NP  atorvastatin (LIPITOR) 40 MG tablet Take 1 tablet (40 mg total) by mouth daily. 08/30/18   Gildardo Pounds, NP  Blood Glucose Monitoring Suppl (ACCU-CHEK AVIVA PLUS) w/Device KIT 1 each by Does not apply route 3 (three) times daily. METER STOPPED WORKING 07/09/17   Gildardo Pounds, NP  glimepiride (AMARYL) 2 MG tablet Take 1 tablet (2 mg total) by mouth daily before breakfast. 08/30/18 11/28/18  Gildardo Pounds, NP  glucose blood (ACCU-CHEK AVIVA) test strip Use as instructed. Check blood glucose levels twice per day by fingerstick 07/11/18   Gildardo Pounds, NP  glucose blood test strip Provide patient with insurance approved strips. Use as instructed. Inject into the skin once daily. 08/30/18   Gildardo Pounds, NP  hydrochlorothiazide (HYDRODIURIL) 25 MG tablet Take 1 tablet (25 mg total) by mouth daily. 08/30/18   Gildardo Pounds, NP  lisinopril (ZESTRIL) 40 MG tablet Take 1 tablet (40 mg total) by mouth daily. 08/30/18   Gildardo Pounds, NP  sitaGLIPtin-metformin (JANUMET) 50-1000 MG tablet Take 1 tablet by mouth 2 (two) times daily with a meal. 08/30/18   Gildardo Pounds, NP  Vitamin D, Ergocalciferol, (DRISDOL) 1.25 MG (50000 UT) CAPS capsule Take 1 capsule (50,000 Units total) by mouth every 7 (seven) days. 01/17/18   Gildardo Pounds, NP    Family History Family History  Problem Relation Age of Onset  . Hypertension Mother   .  Hypertension Father     Social History Social History   Tobacco Use  . Smoking status: Never Smoker  . Smokeless tobacco: Never Used  Substance Use Topics  . Alcohol use: No  . Drug use: No     Allergies   Patient has no known allergies.   Review of Systems Review of Systems  Level 5 caveat because of degree of illness and language barrier.  Physical Exam Updated Vital Signs BP 96/60   Pulse 93   Temp (!) 102.5 F (39.2 C) (Oral)   Resp (!) 22   Ht 5' 4"  (1.626 m)   Wt 61.2 kg   SpO2 (!) 88%   BMI 23.17 kg/m    Physical Exam Vitals signs and nursing note reviewed.  Constitutional:      Appearance: She is well-developed. She is ill-appearing.  HENT:     Head: Normocephalic and atraumatic.  Eyes:     General:        Right eye: No discharge.        Left eye: No discharge.     Conjunctiva/sclera: Conjunctivae normal.  Neck:     Musculoskeletal: Neck supple.  Cardiovascular:     Rate and Rhythm: Regular rhythm. Tachycardia present.     Heart sounds: Normal heart sounds. No murmur. No friction rub. No gallop.   Pulmonary:     Effort: Respiratory distress present.     Comments: Tachypnea.  No clear adventitious breath sounds appreciated. Abdominal:     General: There is no distension.     Palpations: Abdomen is soft.     Tenderness: There is no abdominal tenderness.  Musculoskeletal:        General: No tenderness.  Skin:    General: Skin is warm and dry.  Neurological:     Mental Status: She is alert.  Psychiatric:        Behavior: Behavior normal.        Thought Content: Thought content normal.      ED Treatments / Results  Labs (all labs ordered are listed, but only abnormal results are displayed) Labs Reviewed  CBC WITH DIFFERENTIAL/PLATELET - Abnormal; Notable for the following components:      Result Value   WBC 17.0 (*)    Hemoglobin 11.0 (*)    HCT 33.6 (*)    MCV 69.3 (*)    MCH 22.7 (*)    Neutro Abs 15.5 (*)    Monocytes Absolute 0.0 (*)    All other components within normal limits  LACTIC ACID, PLASMA - Abnormal; Notable for the following components:   Lactic Acid, Venous 4.5 (*)    All other components within normal limits  URINALYSIS, ROUTINE W REFLEX MICROSCOPIC - Abnormal; Notable for the following components:   APPearance HAZY (*)    Hgb urine dipstick MODERATE (*)    Protein, ur 100 (*)    Nitrite POSITIVE (*)    Leukocytes,Ua MODERATE (*)    WBC, UA >50 (*)    Bacteria, UA MANY (*)    Non Squamous Epithelial 0-5 (*)    All other components within  normal limits  COMPREHENSIVE METABOLIC PANEL - Abnormal; Notable for the following components:   Potassium 3.4 (*)    CO2 19 (*)    Glucose, Bld 196 (*)    BUN 29 (*)    Creatinine, Ser 1.67 (*)    GFR calc non Af Amer 31 (*)    GFR calc Af Amer 36 (*)  All other components within normal limits  SARS CORONAVIRUS 2 BY RT PCR (HOSPITAL ORDER, [facility])  CULTURE, BLOOD (ROUTINE X 2)  CULTURE, BLOOD (ROUTINE X 2)  LACTIC ACID, PLASMA    EKG None  Radiology Dg Chest Portable 1 View  Result Date: 11/01/2018 CLINICAL DATA:  Fever and hypoxemia EXAM: PORTABLE CHEST 1 VIEW COMPARISON:  05/24/2006 FINDINGS: Mild cardiomegaly. No consolidation or pleural effusion. Aortic atherosclerosis. No pneumothorax. IMPRESSION: Mild cardiomegaly.  No focal pulmonary opacity. Electronically Signed   By: Donavan Foil M.D.   On: 11/01/2018 15:46    Procedures Procedures (including critical care time)  CRITICAL CARE Performed by: Virgel Manifold Total critical care time: 45 minutes Critical care time was exclusive of separately billable procedures and treating other patients. Critical care was necessary to treat or prevent imminent or life-threatening deterioration. Critical care was time spent personally by me on the following activities: development of treatment plan with patient and/or surrogate as well as nursing, discussions with consultants, evaluation of patient's response to treatment, examination of patient, obtaining history from patient or surrogate, ordering and performing treatments and interventions, ordering and review of laboratory studies, ordering and review of radiographic studies, pulse oximetry and re-evaluation of patient's condition.   Medications Ordered in ED Medications  0.9 %  sodium chloride infusion ( Intravenous New Bag/Given 11/01/18 1615)  vancomycin (VANCOCIN) 1,500 mg in sodium chloride 0.9 % 500 mL IVPB (1,500 mg Intravenous New Bag/Given  11/01/18 1736)  ibuprofen (ADVIL) tablet 400 mg (has no administration in time range)  albuterol (VENTOLIN HFA) 108 (90 Base) MCG/ACT inhaler 4 puff (has no administration in time range)  acetaminophen (TYLENOL) tablet 325 mg (325 mg Oral Given 11/01/18 1533)  ceFEPIme (MAXIPIME) 2 g in sodium chloride 0.9 % 100 mL IVPB (0 g Intravenous Stopped 11/01/18 1736)  lactated ringers bolus 2,000 mL (2,000 mLs Intravenous New Bag/Given 11/01/18 1654)     Initial Impression / Assessment and Plan / ED Course  I have reviewed the triage vital signs and the nursing notes.  Pertinent labs & imaging results that were available during my care of the patient were reviewed by me and considered in my medical decision making (see chart for details).        6:45 PM Called to bedside by nursing. Pt appears worse. Interpretor used. Says she feels cold. He looks very uncomfortable but denies any other complaints. Specifically asked about pain, her breathing, etc. She is having rigors and this is probably why she is feeling worse again. Temp initially improved with tylenol but up again. Needing increasing o2. Wheezing? Will try some albuterol. BP improving with IVF. Abx infusing.  COVID negative. UA subsequently consistent with UTI.   7:55 PM Had to be placed on NRB for increased o2 requirement. WOB has significantly increased and HR sustained 130-140. COVID negative. Albuterol didn't help. Will try bipap. Initial imaging pretty unremarkable and I cannot clearly account for why her breathing has worsened at this point. Possibly corresponding to fluid resuscitation. Advised nursing to place fluid on hold for time being. Blood pressure has improved. No diagnosed hx of HF. Last ECHO from 2015 and I cannot find the report. Check ABG. Will obtain additional imaging and just go ahead and CTa.   Aimee Parker was evaluated in Emergency Department on 11/01/2018 for the symptoms described in the history of present illness.  She was evaluated in the context of the global COVID-19 pandemic, which necessitated consideration that the  patient might be at risk for infection with the SARS-CoV-2 virus that causes COVID-19. Institutional protocols and algorithms that pertain to the evaluation of patients at risk for COVID-19 are in a state of rapid change based on information released by regulatory bodies including the CDC and federal and state organizations. These policies and algorithms were followed during the patient's care in the ED.   Final Clinical Impressions(s) / ED Diagnoses   Final diagnoses:  Community acquired pneumonia, unspecified laterality  Acute respiratory failure, unspecified whether with hypoxia or hypercapnia ([place])  Urinary tract infection without hematuria, site unspecified    ED Discharge Orders    None       Virgel Manifold, MD 11/04/18 [phone]

## 2018-11-01 NOTE — ED Triage Notes (Signed)
Pt presents with complaints of fever, nausea and fatigue that started yesterday. Patient is sleepy during triage.

## 2018-11-01 NOTE — Progress Notes (Signed)
Pharmacy Antibiotic Note  Aimee Parker is a 68 y.o. female admitted on 11/01/2018 with sepsis.  Pharmacy has been consulted for vancomycin and cefepime dosing.  Presenting with fever, abdominal pain, nausea, and fatigue. WBC 17, LA 4.5, temp 102.7, Scr 1.67 (CrCl 27 mL/min).   Plan: Vancomycin 1250 mg IV every 48 hours (estAUC 515)  Cefepime 2 g IV every 24 hours Monitor renal fx, cx results, clinical pic, and vanc levels as appropriate  Height: 5\' 4"  (162.6 cm) Weight: 135 lb (61.2 kg) IBW/kg (Calculated) : 54.7  Temp (24hrs), Avg:101.2 F (38.4 C), Min:98 F (36.7 C), Max:103.2 F (39.6 C)  Recent Labs  Lab 11/01/18 1535 11/01/18 1600  WBC 17.0*  --   CREATININE 1.67*  --   LATICACIDVEN  --  4.5*    Estimated Creatinine Clearance: 27.8 mL/min (A) (by C-G formula based on SCr of 1.67 mg/dL (H)).    No Known Allergies  Antimicrobials this admission: Vancomycin 10/20 >>  Cefepime 10/20 >>   Dose adjustments this admission: N/A  Microbiology results: 10/20 BCx: sent 10/20 UCx: sent  10/20 COVID: neg    Thank you for allowing pharmacy to be a part of this patient's care.  Antonietta Jewel, PharmD, [facility] Clinical Pharmacist  Phone: [phone]  Please check AMION for all [facility] phone numbers After 10:00 PM, call [facility] [phone] 11/01/2018 10:28 PM

## 2018-11-01 NOTE — Progress Notes (Signed)
Patient transported on BIPAP from ED room 17 to 8M76 with no complications.

## 2018-11-01 NOTE — ED Notes (Signed)
Patient is being discharged from the Urgent [facility] and sent to the Emergency Department via wheelchair by staff. Per provider Barkley Boards, patient is stable but in need of higher level of care due to unstable vitals & altered mental status. Patient is aware and verbalizes understanding of plan of care.   Vitals:   11/01/18 1423  BP: (!) 124/55  Pulse: (!) 106  Resp: (!) 26  Temp: (!) 103.2 F (39.6 C)  SpO2: 95%

## 2018-11-01 NOTE — Consult Note (Addendum)
NAME:  Aimee Parker, MRN:  [id number], DOB:  10/09/1950, LOS: 0 ADMISSION DATE:  11/01/2018, CONSULTATION DATE:  11/01/18 REFERRING MD: Dr. Marva Panda , CHIEF COMPLAINT:  sepsis   Brief History   68 y.o. F with PMH of DM and HTN who presents with 2-3 days of fever, malaise and fatigue.  She was found to have probable bilateral PNA and UTI in the ED with lactic acidosis  and required Bipap, therefore PCCM was consulted.  History of present illness   68 y.o. F who coming from home with PMH of DM and HTN who c/o fever, malaise, fatigue and some abdominal pain.  She has been in her normal state of health until onset of symptoms and initially presented to urgent care, then to the ED and was initially febrile to 103F with tachycardia and eventually oxygen sats dropped in the 80%'s and pt was placed on Bipap.  In the ED, labs were significant for lactic acid of 4.3, WBC 17k, CO2 19 with gap of 15, creatinine 1.67 (baseline ~1.0) troponin 334 and UA significant for leuks and bacteria and +nitrite positive. She received 4L IVF, cefepime and vancomycin.  CT chest notable for bilateral basilar infiltrates and ground glass opacities.   She was initially admitted to internal medicine and as BP was down-trending and she was requiring Bipap, PCCM was consulted.  On evaluation, pt is awake and responsive and denies any complaints.  Interviewed with translation assistance from family [facility] interpreter on the way).  She denies any chest pain, current abdominal pain, recent URI symptoms, diarrhea or other complaints.  No recent UTI's or hospitalizations.   Past Medical History   Past Medical History:  Diagnosis Date  . Diabetes mellitus without complication ([place])   . Hypertension   . Vitamin D deficiency      Significant Hospital Events   10/20 Admit to PCCM  Consults:    Procedures:    Significant Diagnostic Tests:  10/20 CT chest>>Patchy airspace areas of consolidation in the posterior lower lungs with  ground-glass opacities in the upper lungs 10/20 CT abd/pelvis>>pending  Micro Data:  10/20 Sars-CoV-2>>neg 10/20 Blood culture>> 10/20 UC>>  Antimicrobials:  Vancomycin 10/20> Cefepime 10/20>  Interim history/subjective:  Mental status and respiratory status have improved since arrival in the ED, however given SBP in 90's despite IVF, Bipap and acidosis, PCCM consulted  Objective   Blood pressure (!) 93/58, pulse (!) 108, temperature (!) 102.5 F (39.2 C), temperature source Oral, resp. rate (!) 26, height 5' 4"  (1.626 m), weight 61.2 kg, SpO2 100 %.    FiO2 (%):  [70 %-100 %] 70 %   Intake/Output Summary (Last 24 hours) at 11/01/2018 2301 Last data filed at 11/01/2018 2005 Gross per 24 hour  Intake 850 ml  Output -  Net 850 ml   Filed Weights   11/01/18 1527  Weight: 61.2 kg   General:  Elderly F on Bipap, no acute distress, non-toxic appearing HEENT: MM pink/moist Neuro: Awake and alert and answering questions,  CV: s1s2 RRR, no m/r/g PULM:  Course breath sounds bilateral bases, good air movement bilaterally, no rhonchi or wheezing GI: soft, bsx4 active  Extremities: warm/dry, no edema  Skin: no rashes or lesions   Resolved Hospital Problem list     Assessment & Plan:    Severe Sepsis  -likely secondary to UTI with PNA P: -repeat lactic acid now after 30/cc kg -follow blood and urine cultures -repeat CXR in the AM -Continue Vanc and cefepime,  narrow as cultures result -CT abd/pelvis ordered in the ED, follow results -Has received >4L IVF in the ED, if persistently hypotensive would start pressors  Hypoxic Respiratory Failure -suspect 2/2 acidosis vs PNA/ARDS P: -Continue bipap overnight, suspect may be able to discontinue in the morning -Repeat CXR tomorrow  Metabolic Acidosis -suspect this is secondary to renal injury and lactic acidosis P: -repeat BMP overnight, continue IVF and trend lactic acid -consider bicarb gtt if worsening  AKI  -Creatinine 1.67, suspect this is pre-renal secondary to sepsis and insensible losses from fever P: -repeat creatinine overnight and monitor UOP  Elevated Troponin -Trop 628>315 -suspect secondary to demand ischemia from sepsis, no signs of ACS on initial EKG P: -continue to trend trop -repeat EKG in the AM -Echo ordered   Hypokalemia/Hypomagnesemia  -Given 77mq K and Magnesium 2g Iv overnight P -Follow repeat BMET   Best practice:  Diet: NPO Pain/Anxiety/Delirium protocol (if indicated): n/a VAP protocol (if indicated): n/a DVT prophylaxis: heparin, SCD's GI prophylaxis: n/a Glucose control: SSI Mobility: with assistance Code Status: Full Family Communication: spoke to husband and mother at the beside Disposition: ICU  Labs   CBC: Recent Labs  Lab 11/01/18 1535 11/01/18 2039  WBC 17.0*  --   NEUTROABS 15.5*  --   HGB 11.0* 9.5*  HCT 33.6* 28.0*  MCV 69.3*  --   PLT 179  --     Basic Metabolic Panel: Recent Labs  Lab 11/01/18 1535 11/01/18 2039  NA 135 137  K 3.4* 3.7  CL 101  --   CO2 19*  --   GLUCOSE 196*  --   BUN 29*  --   CREATININE 1.67*  --   CALCIUM 9.2  --    GFR: Estimated Creatinine Clearance: 27.8 mL/min (A) (by C-G formula based on SCr of 1.67 mg/dL (H)). Recent Labs  Lab 11/01/18 1535 11/01/18 1600  WBC 17.0*  --   LATICACIDVEN  --  4.5*    Liver Function Tests: Recent Labs  Lab 11/01/18 1535  AST 37  ALT 33  ALKPHOS 68  BILITOT 1.0  PROT 7.6  ALBUMIN 3.6   No results for input(s): LIPASE, AMYLASE in the last 168 hours. No results for input(s): AMMONIA in the last 168 hours.  ABG    Component Value Date/Time   PHART 7.379 11/01/2018 2039   PCO2ART 24.1 (L) 11/01/2018 2039   PO2ART 69.0 (L) 11/01/2018 2039   HCO3 13.9 (L) 11/01/2018 2039   TCO2 15 (L) 11/01/2018 2039   ACIDBASEDEF 9.0 (H) 11/01/2018 2039   O2SAT 91.0 11/01/2018 2039     Coagulation Profile: No results for input(s): INR, PROTIME in the last  168 hours.  Cardiac Enzymes: No results for input(s): CKTOTAL, CKMB, CKMBINDEX, TROPONINI in the last 168 hours.  HbA1C: Hemoglobin A1C  Date/Time Value Ref Range Status  08/30/2018 10:15 AM 7.7 (A) 4.0 - 5.6 % Final  05/20/2018 09:25 AM 9.4 (A) 4.0 - 5.6 % Final   HbA1c, POC (controlled diabetic range)  Date/Time Value Ref Range Status  01/17/2018 10:27 AM 8.3 (A) 0.0 - 7.0 % Corrected   Hgb A1c MFr Bld  Date/Time Value Ref Range Status  05/23/2015 11:21 AM 9.6 (H) <5.7 % Final    Comment:      For someone without known diabetes, a hemoglobin A1c value of 6.5% or greater indicates that they may have diabetes and this should be confirmed with a follow-up test.   For someone with known diabetes, a  value <7% indicates that their diabetes is well controlled and a value greater than or equal to 7% indicates suboptimal control. A1c targets should be individualized based on duration of diabetes, age, comorbid conditions, and other considerations.   Currently, no consensus exists for use of hemoglobin A1c for diagnosis of diabetes for children.       CBG: Recent Labs  Lab 11/01/18 1442  GLUCAP 177*    Review of Systems:   Negative except as mentioned in HPI  Past Medical History  She,  has a past medical history of Diabetes mellitus without complication ([place]), Hypertension, and Vitamin D deficiency.   Surgical History    Past Surgical History:  Procedure Laterality Date  . EYE SURGERY     Left eye surgery  . PARS PLANA VITRECTOMY Right 05/13/2017   Procedure: PARS PLANA VITRECTOMY 25 GAUGE FOR ENDOPHTHALMITIS;  Surgeon: Hurman Horn, MD;  Location: [facility];  Service: Ophthalmology;  Laterality: Right;     Social History   reports that she has never smoked. She has never used smokeless tobacco. She reports that she does not drink alcohol or use drugs.   Family History   Her family history includes Hypertension in her father and mother.   Allergies No Known  Allergies   Home Medications  Prior to Admission medications   Medication Sig Start Date End Date Taking? Authorizing Provider  Accu-Chek Softclix Lancets lancets Use as instructed. Check blood glucose levels twice per day by fingerstick 07/11/18   Gildardo Pounds, NP  amLODipine (NORVASC) 10 MG tablet Take 1 tablet (10 mg total) by mouth daily. 08/30/18   Gildardo Pounds, NP  atorvastatin (LIPITOR) 40 MG tablet Take 1 tablet (40 mg total) by mouth daily. 08/30/18   Gildardo Pounds, NP  Blood Glucose Monitoring Suppl (ACCU-CHEK AVIVA PLUS) w/Device KIT 1 each by Does not apply route 3 (three) times daily. METER STOPPED WORKING 07/09/17   Gildardo Pounds, NP  glimepiride (AMARYL) 2 MG tablet Take 1 tablet (2 mg total) by mouth daily before breakfast. 08/30/18 11/28/18  Gildardo Pounds, NP  glucose blood (ACCU-CHEK AVIVA) test strip Use as instructed. Check blood glucose levels twice per day by fingerstick 07/11/18   Gildardo Pounds, NP  glucose blood test strip Provide patient with insurance approved strips. Use as instructed. Inject into the skin once daily. 08/30/18   Gildardo Pounds, NP  hydrochlorothiazide (HYDRODIURIL) 25 MG tablet Take 1 tablet (25 mg total) by mouth daily. 08/30/18   Gildardo Pounds, NP  lisinopril (ZESTRIL) 40 MG tablet Take 1 tablet (40 mg total) by mouth daily. 08/30/18   Gildardo Pounds, NP  sitaGLIPtin-metformin (JANUMET) 50-1000 MG tablet Take 1 tablet by mouth 2 (two) times daily with a meal. 08/30/18   Gildardo Pounds, NP  Vitamin D, Ergocalciferol, (DRISDOL) 1.25 MG (50000 UT) CAPS capsule Take 1 capsule (50,000 Units total) by mouth every 7 (seven) days. 01/17/18   Gildardo Pounds, NP     Critical care time: 65 minutes     CRITICAL CARE Performed by: Otilio Carpen Dajsha Massaro   Total critical care time: 65 minutes  Critical care time was exclusive of separately billable procedures and treating other patients.  Critical care was necessary to treat or prevent  imminent or life-threatening deterioration.  Critical care was time spent personally by me on the following activities: development of treatment plan with patient and/or surrogate as well as nursing, discussions with consultants, evaluation of  patient's response to treatment, examination of patient, obtaining history from patient or surrogate, ordering and performing treatments and interventions, ordering and review of laboratory studies, ordering and review of radiographic studies, pulse oximetry and re-evaluation of patient's condition.    Otilio Carpen Zabria Liss, PA-C [place] [facility]  Pager# [phone], if no answer [phone]

## 2018-11-01 NOTE — H&P (Signed)
Date: 11/01/2018               Patient Name:  Aimee Parker MRN: [id number]  DOB: 1950/10/21 Age / Sex: 68 y.o., female   PCP: Claiborne Rigg, NP         Medical Service: Internal Medicine Teaching Service         Attending Physician: Dr. Earl Lagos, MD    First Contact: Sande Brothers, MD, Viviann Spare Pager: [phone])  Second Contact: Maryla Morrow, MD, Ellie Pager: [phone])       After Hours (After 5p/  First Contact Pager: [phone]  weekends / holidays): Second Contact Pager: [phone]   Chief Complaint: fever  History of Present Illness: 67 y.o. female w/ PMH significant for hypertension and DMII requiring insulin presenting with two day history of fevers, dyspnea and fatigue. Patient is on BiPAP and history obtained by daughter at bedside via interpreter and chart review. Per daughter, patient was in usual state of health until yesterday when she had a fever and trouble breathing. Patient also has been fatigued. Patient presented to urgent care earlier today for complaints of fever, abdominal pain, nausea and fatigue. At the urgent care, patient had temp of 103.2 with altered mental status and tachycardia. She was sent to the ED for further evaluation. Patient denies any headache, chest pain, cough, abdominal pain, nausea/vomiting/diarrhea, or urinary symptoms.   ED course:  Patient brought by family from urgent care She was hypotensive, tachypnic, tachycardic with increased O2 requirement. Patient given tylenol for fever, albuterol and started on antibiotics and fluids. Patient initially placed on 15L O2 via NRB and then transitioned to BiPAP due to increased work of breathing. Suspect this corresponded to fluid resuscitation. ABG with metabolic acidosis. UA consistent with UTI. Patient admitted to internal medicine for further evaluation and management.   Barriers to communication: language, patient on BiPAP  Meds:  No outpatient medications have been marked as taking  for the 11/01/18 encounter [name] Encounter).     Allergies: Allergies as of 11/01/2018  . (No Known Allergies)   Past Medical History:  Diagnosis Date  . Diabetes mellitus without complication ([facility])   . Hypertension   . Vitamin D deficiency     Family History:  Family History  Problem Relation Age of Onset  . Hypertension Mother   . Hypertension Father      Social History:  Social History   Tobacco Use  . Smoking status: Never Smoker  . Smokeless tobacco: Never Used  Substance Use Topics  . Alcohol use: No  . Drug use: No   Patient lives at home with husband, daughter and her family. No smoking history, alcohol use or illicit drug use. Patient does not take any OTC or herbal supplements.   Review of Systems: A complete ROS was negative except as per HPI.   Physical Exam: Blood pressure (!) 93/52, pulse (!) 110, temperature (!) 102.5 F (39.2 C), temperature source Oral, resp. rate (!) 36, height 5\' 4"  (1.626 m), weight 61.2 kg, SpO2 98 %. Physical Exam Vitals signs reviewed.  Constitutional:      General: She is not in acute distress.    Appearance: She is ill-appearing and diaphoretic.  HENT:     Head: Normocephalic and atraumatic.  Eyes:     General: No scleral icterus.    Extraocular Movements: Extraocular movements intact.     Comments: L eye sclera is red, likely ruptured BV  Neck:     Musculoskeletal:  Normal range of motion and neck supple.  Cardiovascular:     Rate and Rhythm: Regular rhythm. Tachycardia present.     Pulses: Normal pulses.     Heart sounds: Normal heart sounds. No murmur. No friction rub. No gallop.   Pulmonary:     Effort: Respiratory distress present.     Breath sounds: Rales present. No wheezing or rhonchi.     Comments: Diffuse fine crackles on auscultation  Chest:     Chest wall: No tenderness.  Abdominal:     General: Bowel sounds are normal. There is no distension.     Palpations: Abdomen is soft.     Tenderness:  There is no abdominal tenderness. There is no guarding or rebound.  Musculoskeletal: Normal range of motion.        General: No swelling or tenderness.  Skin:    General: Skin is warm.     Capillary Refill: Capillary refill takes less than 2 seconds.     Findings: No bruising, lesion or rash.  Neurological:     General: No focal deficit present.     Mental Status: She is alert.    CBC Latest Ref Rng & Units 11/01/2018 11/01/2018 08/30/2018  WBC 4.0 - 10.5 K/uL - 17.0(H) 10.3  Hemoglobin 12.0 - 15.0 g/dL 9.5(L) 11.0(L) 12.0  Hematocrit 36.0 - 46.0 % 28.0(L) 33.6(L) 37.8  Platelets 150 - 400 K/uL - 179 347   CMP     Component Value Date/Time   NA 137 11/01/2018 2039   NA 142 08/30/2018 1056   K 3.7 11/01/2018 2039   CL 101 11/01/2018 1535   CO2 19 (L) 11/01/2018 1535   GLUCOSE 196 (H) 11/01/2018 1535   BUN 29 (H) 11/01/2018 1535   BUN 20 08/30/2018 1056   CREATININE 1.67 (H) 11/01/2018 1535   CREATININE 0.88 11/28/2015 1223   CALCIUM 9.2 11/01/2018 1535   PROT 7.6 11/01/2018 1535   PROT 8.0 01/17/2018 1104   ALBUMIN 3.6 11/01/2018 1535   ALBUMIN 4.9 (H) 01/17/2018 1104   AST 37 11/01/2018 1535   ALT 33 11/01/2018 1535   ALKPHOS 68 11/01/2018 1535   BILITOT 1.0 11/01/2018 1535   BILITOT 0.3 01/17/2018 1104   GFRNONAA 31 (L) 11/01/2018 1535   GFRNONAA 69 11/28/2015 1223   GFRAA 36 (L) 11/01/2018 1535   GFRAA 80 11/28/2015 1223   ABG    Component Value Date/Time   PHART 7.379 11/01/2018 2039   PCO2ART 24.1 (L) 11/01/2018 2039   PO2ART 69.0 (L) 11/01/2018 2039   HCO3 13.9 (L) 11/01/2018 2039   TCO2 15 (L) 11/01/2018 2039   ACIDBASEDEF 9.0 (H) 11/01/2018 2039   O2SAT 91.0 11/01/2018 2039    EKG: personally reviewed my interpretation is sinus tachycardia, multiple PVCs, with prolonged QTc of 572  CXR: personally reviewed my interpretation is cardiomegaly with diffuse interstitial infiltration.   CTA CHEST:  IMPRESSION: 1. Patchy airspace areas of consolidation  in the posterior lower lungs with ground-glass opacities in the upper lungs. There are a spectrum of findings in the lungs which can be seen with acute atypical infection (as well as other non-infectious etiologies). In particular, viral pneumonia should be considered in the appropriate clinical setting. 2. No central, segmental, or subsegmental pulmonary embolism. 3. Scattered mediastinal and right hilar lymph nodes. 4. Coronary artery calcifications 5. Aortic atherosclerosis.  Assessment & Plan by Problem:  Patient is a 68yo female with Hx of HTN and DMII presenting with two day history of fevers, dyspnea,  and generalized fatigue. Patient with increasing oxygen requirement, tachycardia and hypotension and UA consistent with UTI. CTA negative for PE; however, areas of consolidation in posterior lower lungs   Sepsis 2/2 UTI vs. Pulmonary infection:  Pt presented with high grade fever, tachypnea and tachycardia. UA with bacteria, nitrites, leukocytes concerning for possible urinary source of infection. Leukocytosis with lactic acidosis on labs. CTA chest findings consistent with bilateral basilar infiltrates and ground glass opacities concerning for atypical pneumonia. Patient started on broad spectrum antibiotics and IVF. Patient on BiPAP and stable. BP continued to decrease despite adequate fluid resuscitation. Due to concern for patient needing vasopressor support, [facility] consulted [facility]    QT Prolongation:  QTc 572 on EKG with sinus tachycardia and multiple PVCs. K 3.4 and Mag 1.2 - Replete electrolytes prn - CMP in AM  Dispo: Admit patient to Inpatient with expected length of stay greater than 2 midnights.  Signed: Eliezer BottomAslam, Anelle Parlow, MD  Internal Medicine, PGY-1 11/01/2018, 10:19 PM  Pager: [phone]

## 2018-11-01 NOTE — ED Notes (Signed)
Provider at bedside, placed pt on NRB 15L. O2 saturation now 95-97%. Still tachycardic (140 BPM)

## 2018-11-01 NOTE — ED Triage Notes (Signed)
Pt BIB by Family after pt had a fever and feeling weak since yesterday morning.

## 2018-11-02 ENCOUNTER — Inpatient Hospital Stay (HOSPITAL_COMMUNITY): Payer: Medicare Other

## 2018-11-02 ENCOUNTER — Inpatient Hospital Stay: Payer: Self-pay

## 2018-11-02 DIAGNOSIS — J189 Pneumonia, unspecified organism: Secondary | ICD-10-CM | POA: Diagnosis not present

## 2018-11-02 DIAGNOSIS — N179 Acute kidney failure, unspecified: Secondary | ICD-10-CM | POA: Diagnosis not present

## 2018-11-02 DIAGNOSIS — R6521 Severe sepsis with septic shock: Secondary | ICD-10-CM | POA: Diagnosis not present

## 2018-11-02 DIAGNOSIS — R509 Fever, unspecified: Secondary | ICD-10-CM

## 2018-11-02 DIAGNOSIS — A419 Sepsis, unspecified organism: Secondary | ICD-10-CM | POA: Diagnosis present

## 2018-11-02 DIAGNOSIS — N12 Tubulo-interstitial nephritis, not specified as acute or chronic: Secondary | ICD-10-CM | POA: Diagnosis present

## 2018-11-02 DIAGNOSIS — Z978 Presence of other specified devices: Secondary | ICD-10-CM

## 2018-11-02 DIAGNOSIS — J9601 Acute respiratory failure with hypoxia: Secondary | ICD-10-CM | POA: Diagnosis not present

## 2018-11-02 DIAGNOSIS — R0600 Dyspnea, unspecified: Secondary | ICD-10-CM | POA: Diagnosis not present

## 2018-11-02 LAB — POCT I-STAT 7, (LYTES, BLD GAS, ICA,H+H)
Acid-base deficit: 9 mmol/L — ABNORMAL HIGH (ref 0.0–2.0)
Acid-base deficit: 7 mmol/L — ABNORMAL HIGH (ref 0.0–2.0)
Acid-base deficit: 10 mmol/L — ABNORMAL HIGH (ref 0.0–2.0)
Bicarbonate: 15.9 mmol/L — ABNORMAL LOW (ref 20.0–28.0)
Bicarbonate: 19.1 mmol/L — ABNORMAL LOW (ref 20.0–28.0)
Bicarbonate: 13.7 mmol/L — ABNORMAL LOW (ref 20.0–28.0)
Calcium, Ion: 1.19 mmol/L (ref 1.15–1.40)
Calcium, Ion: 1.16 mmol/L (ref 1.15–1.40)
Calcium, Ion: 1.07 mmol/L — ABNORMAL LOW (ref 1.15–1.40)
HCT: 27 % — ABNORMAL LOW (ref 36.0–46.0)
HCT: 27 % — ABNORMAL LOW (ref 36.0–46.0)
HCT: 25 % — ABNORMAL LOW (ref 36.0–46.0)
Hemoglobin: 9.2 g/dL — ABNORMAL LOW (ref 12.0–15.0)
Hemoglobin: 9.2 g/dL — ABNORMAL LOW (ref 12.0–15.0)
Hemoglobin: 8.5 g/dL — ABNORMAL LOW (ref 12.0–15.0)
O2 Saturation: 97 %
O2 Saturation: 95 %
O2 Saturation: 98 %
Patient temperature: 99
Patient temperature: 101
Patient temperature: 103.1
Potassium: 3.9 mmol/L (ref 3.5–5.1)
Potassium: 3.7 mmol/L (ref 3.5–5.1)
Potassium: 4 mmol/L (ref 3.5–5.1)
Sodium: 139 mmol/L (ref 135–145)
Sodium: 139 mmol/L (ref 135–145)
Sodium: 140 mmol/L (ref 135–145)
TCO2: 17 mmol/L — ABNORMAL LOW (ref 22–32)
TCO2: 20 mmol/L — ABNORMAL LOW (ref 22–32)
TCO2: 14 mmol/L — ABNORMAL LOW (ref 22–32)
pCO2 arterial: 30.2 mmHg — ABNORMAL LOW (ref 32.0–48.0)
pCO2 arterial: 43.3 mmHg (ref 32.0–48.0)
pCO2 arterial: 23.8 mmHg — ABNORMAL LOW (ref 32.0–48.0)
pH, Arterial: 7.33 — ABNORMAL LOW (ref 7.350–7.450)
pH, Arterial: 7.259 — ABNORMAL LOW (ref 7.350–7.450)
pH, Arterial: 7.378 (ref 7.350–7.450)
pO2, Arterial: 102 mmHg (ref 83.0–108.0)
pO2, Arterial: 90 mmHg (ref 83.0–108.0)
pO2, Arterial: 110 mmHg — ABNORMAL HIGH (ref 83.0–108.0)

## 2018-11-02 LAB — GLUCOSE, CAPILLARY
Glucose-Capillary: 113 mg/dL — ABNORMAL HIGH (ref 70–99)
Glucose-Capillary: 117 mg/dL — ABNORMAL HIGH (ref 70–99)
Glucose-Capillary: 121 mg/dL — ABNORMAL HIGH (ref 70–99)
Glucose-Capillary: 137 mg/dL — ABNORMAL HIGH (ref 70–99)
Glucose-Capillary: 144 mg/dL — ABNORMAL HIGH (ref 70–99)
Glucose-Capillary: 78 mg/dL (ref 70–99)
Glucose-Capillary: 92 mg/dL (ref 70–99)

## 2018-11-02 LAB — CBC
HCT: 28.2 % — ABNORMAL LOW (ref 36.0–46.0)
Hemoglobin: 8.9 g/dL — ABNORMAL LOW (ref 12.0–15.0)
MCH: 22.1 pg — ABNORMAL LOW (ref 26.0–34.0)
MCHC: 31.6 g/dL (ref 30.0–36.0)
MCV: 70 fL — ABNORMAL LOW (ref 80.0–100.0)
Platelets: 111 10*3/uL — ABNORMAL LOW (ref 150–400)
RBC: 4.03 MIL/uL (ref 3.87–5.11)
RDW: 15.5 % (ref 11.5–15.5)
WBC: 17.1 10*3/uL — ABNORMAL HIGH (ref 4.0–10.5)
nRBC: 0 % (ref 0.0–0.2)

## 2018-11-02 LAB — TROPONIN I (HIGH SENSITIVITY): Troponin I (High Sensitivity): 704 ng/L (ref <18)

## 2018-11-02 LAB — RESPIRATORY PANEL BY PCR

## 2018-11-02 LAB — BLOOD CULTURE ID PANEL (REFLEXED)

## 2018-11-02 LAB — BASIC METABOLIC PANEL
Anion gap: 13 (ref 5–15)
Anion gap: 13 (ref 5–15)
BUN: 28 mg/dL — ABNORMAL HIGH (ref 8–23)
BUN: 31 mg/dL — ABNORMAL HIGH (ref 8–23)
CO2: 17 mmol/L — ABNORMAL LOW (ref 22–32)
CO2: 18 mmol/L — ABNORMAL LOW (ref 22–32)
Calcium: 7.9 mg/dL — ABNORMAL LOW (ref 8.9–10.3)
Calcium: 8.1 mg/dL — ABNORMAL LOW (ref 8.9–10.3)
Chloride: 107 mmol/L (ref 98–111)
Chloride: 106 mmol/L (ref 98–111)
Creatinine, Ser: 1.63 mg/dL — ABNORMAL HIGH (ref 0.44–1.00)
Creatinine, Ser: 1.96 mg/dL — ABNORMAL HIGH (ref 0.44–1.00)
GFR calc Af Amer: 37 mL/min — ABNORMAL LOW (ref >60)
GFR calc Af Amer: 30 mL/min — ABNORMAL LOW (ref >60)
GFR calc non Af Amer: 32 mL/min — ABNORMAL LOW (ref >60)
GFR calc non Af Amer: 26 mL/min — ABNORMAL LOW (ref >60)
Glucose, Bld: 113 mg/dL — ABNORMAL HIGH (ref 70–99)
Glucose, Bld: 155 mg/dL — ABNORMAL HIGH (ref 70–99)
Potassium: 3.4 mmol/L — ABNORMAL LOW (ref 3.5–5.1)
Potassium: 3.5 mmol/L (ref 3.5–5.1)
Sodium: 137 mmol/L (ref 135–145)
Sodium: 137 mmol/L (ref 135–145)

## 2018-11-02 LAB — LACTIC ACID, PLASMA
Lactic Acid, Venous: 6.2 mmol/L (ref 0.5–1.9)
Lactic Acid, Venous: 4.6 mmol/L (ref 0.5–1.9)
Lactic Acid, Venous: 5.2 mmol/L (ref 0.5–1.9)
Lactic Acid, Venous: 4.1 mmol/L (ref 0.5–1.9)
Lactic Acid, Venous: 3.7 mmol/L (ref 0.5–1.9)

## 2018-11-02 LAB — ECHOCARDIOGRAM COMPLETE
Height: 64 in
Weight: 2405.66 oz

## 2018-11-02 LAB — MRSA PCR SCREENING: MRSA by PCR: NEGATIVE

## 2018-11-02 LAB — URINE CULTURE

## 2018-11-02 LAB — HIV ANTIBODY (ROUTINE TESTING W REFLEX): HIV Screen 4th Generation wRfx: NONREACTIVE

## 2018-11-02 LAB — HEMOGLOBIN A1C
Hgb A1c MFr Bld: 8.1 % — ABNORMAL HIGH (ref 4.8–5.6)
Mean Plasma Glucose: 185.77 mg/dL

## 2018-11-02 LAB — CORTISOL: Cortisol, Plasma: 38.9 ug/dL

## 2018-11-02 LAB — MAGNESIUM: Magnesium: 2 mg/dL (ref 1.7–2.4)

## 2018-11-02 MED ORDER — FENTANYL CITRATE (PF) 100 MCG/2ML IJ SOLN
INTRAMUSCULAR | Status: AC
  Administered 2018-11-02: 100 ug via INTRAVENOUS
  Filled 2018-11-02: qty 2

## 2018-11-02 MED ORDER — DOCUSATE SODIUM 50 MG/5ML PO LIQD
100.0000 mg | Freq: Two times a day (BID) | ORAL | Status: DC | PRN
  Administered 2018-11-07: 100 mg
  Filled 2018-11-02: qty 10

## 2018-11-02 MED ORDER — SODIUM BICARBONATE 8.4 % IV SOLN
50.0000 meq | Freq: Once | INTRAVENOUS | Status: AC
  Administered 2018-11-02: 50 meq via INTRAVENOUS
  Filled 2018-11-02: qty 50

## 2018-11-02 MED ORDER — FENTANYL CITRATE (PF) 100 MCG/2ML IJ SOLN
100.0000 ug | Freq: Once | INTRAMUSCULAR | Status: AC
  Administered 2018-11-02: 08:00:00 100 ug via INTRAVENOUS

## 2018-11-02 MED ORDER — ORAL CARE MOUTH RINSE
15.0000 mL | OROMUCOSAL | Status: DC
  Administered 2018-11-02 – 2018-11-11 (×88): 15 mL via OROMUCOSAL

## 2018-11-02 MED ORDER — MIDAZOLAM HCL 2 MG/2ML IJ SOLN
2.0000 mg | Freq: Once | INTRAMUSCULAR | Status: AC
  Administered 2018-11-02: 2 mg via INTRAVENOUS
  Filled 2018-11-02: qty 2

## 2018-11-02 MED ORDER — MIDAZOLAM HCL 2 MG/2ML IJ SOLN
2.0000 mg | Freq: Once | INTRAMUSCULAR | Status: AC
  Administered 2018-11-02: 08:00:00 2 mg via INTRAVENOUS

## 2018-11-02 MED ORDER — ROCURONIUM BROMIDE 50 MG/5ML IV SOLN
75.0000 mg | Freq: Once | INTRAVENOUS | Status: AC
  Administered 2018-11-02: 75 mg via INTRAVENOUS
  Filled 2018-11-02: qty 7.5

## 2018-11-02 MED ORDER — ETOMIDATE 2 MG/ML IV SOLN
20.0000 mg | Freq: Once | INTRAVENOUS | Status: AC
  Administered 2018-11-02: 20 mg via INTRAVENOUS

## 2018-11-02 MED ORDER — FENTANYL CITRATE (PF) 100 MCG/2ML IJ SOLN
25.0000 ug | INTRAMUSCULAR | Status: DC | PRN
  Administered 2018-11-02: 25 ug via INTRAVENOUS
  Administered 2018-11-02: 50 ug via INTRAVENOUS

## 2018-11-02 MED ORDER — MIDAZOLAM HCL 2 MG/2ML IJ SOLN
INTRAMUSCULAR | Status: AC
  Administered 2018-11-02: 2 mg via INTRAVENOUS
  Filled 2018-11-02: qty 2

## 2018-11-02 MED ORDER — CHLORHEXIDINE GLUCONATE 0.12% ORAL RINSE (MEDLINE KIT)
15.0000 mL | Freq: Two times a day (BID) | OROMUCOSAL | Status: DC
  Administered 2018-11-02 – 2018-11-14 (×25): 15 mL via OROMUCOSAL

## 2018-11-02 MED ORDER — SODIUM BICARBONATE 8.4 % IV SOLN
INTRAVENOUS | Status: AC
  Filled 2018-11-02: qty 50

## 2018-11-02 MED ORDER — BISACODYL 10 MG RE SUPP
10.0000 mg | Freq: Every day | RECTAL | Status: DC | PRN
  Administered 2018-11-07: 10 mg via RECTAL
  Filled 2018-11-02: qty 1

## 2018-11-02 MED ORDER — DEXMEDETOMIDINE HCL IN NACL 400 MCG/100ML IV SOLN
0.4000 ug/kg/h | INTRAVENOUS | Status: DC
  Administered 2018-11-02: 0.6 ug/kg/h via INTRAVENOUS
  Administered 2018-11-02: 0.4 ug/kg/h via INTRAVENOUS
  Administered 2018-11-03: 0.7 ug/kg/h via INTRAVENOUS
  Administered 2018-11-03: 04:00:00 1.2 ug/kg/h via INTRAVENOUS
  Administered 2018-11-03: 0.6 ug/kg/h via INTRAVENOUS
  Filled 2018-11-02: qty 100
  Filled 2018-11-02: qty 200
  Filled 2018-11-02 (×2): qty 100

## 2018-11-02 MED ORDER — LACTATED RINGERS IV BOLUS
1000.0000 mL | Freq: Once | INTRAVENOUS | Status: AC
  Administered 2018-11-02: 1000 mL via INTRAVENOUS

## 2018-11-02 MED ORDER — FENTANYL CITRATE (PF) 100 MCG/2ML IJ SOLN
25.0000 ug | INTRAMUSCULAR | Status: DC | PRN
  Administered 2018-11-02 (×2): 50 ug via INTRAVENOUS
  Administered 2018-11-03: 25 ug via INTRAVENOUS
  Administered 2018-11-03 – 2018-11-04 (×2): 50 ug via INTRAVENOUS
  Administered 2018-11-04: 02:00:00 25 ug via INTRAVENOUS
  Administered 2018-11-04: 09:00:00 50 ug via INTRAVENOUS
  Administered 2018-11-04: 01:00:00 25 ug via INTRAVENOUS
  Administered 2018-11-05 – 2018-11-10 (×7): 50 ug via INTRAVENOUS
  Filled 2018-11-02 (×4): qty 2

## 2018-11-02 MED ORDER — FENTANYL CITRATE (PF) 100 MCG/2ML IJ SOLN
25.0000 ug | INTRAMUSCULAR | Status: DC | PRN
  Administered 2018-11-05: 09:00:00 25 ug via INTRAVENOUS
  Filled 2018-11-02 (×2): qty 2

## 2018-11-02 MED ORDER — ALBUMIN HUMAN 5 % IV SOLN
25.0000 g | Freq: Once | INTRAVENOUS | Status: AC
  Administered 2018-11-02: 25 g via INTRAVENOUS
  Filled 2018-11-02: qty 500

## 2018-11-02 MED ORDER — ACETAMINOPHEN 650 MG RE SUPP
650.0000 mg | RECTAL | Status: DC | PRN
  Administered 2018-11-02 (×2): 650 mg via RECTAL
  Filled 2018-11-02 (×2): qty 1

## 2018-11-02 MED ORDER — SODIUM CHLORIDE 0.9 % IV SOLN
2.0000 g | INTRAVENOUS | Status: DC
  Administered 2018-11-02 – 2018-11-06 (×5): 2 g via INTRAVENOUS
  Filled 2018-11-02 (×5): qty 20

## 2018-11-02 MED ORDER — FENTANYL CITRATE (PF) 100 MCG/2ML IJ SOLN: 12.5000 ug | INTRAMUSCULAR | Status: DC | PRN

## 2018-11-02 MED ORDER — PANTOPRAZOLE SODIUM 40 MG IV SOLR
40.0000 mg | Freq: Every day | INTRAVENOUS | Status: DC
  Administered 2018-11-02 – 2018-11-07 (×6): 40 mg via INTRAVENOUS
  Filled 2018-11-02 (×6): qty 40

## 2018-11-02 MED ORDER — NOREPINEPHRINE 4 MG/250ML-% IV SOLN
INTRAVENOUS | Status: AC
  Filled 2018-11-02: qty 250

## 2018-11-02 MED ORDER — CALCIUM GLUCONATE-NACL 1-0.675 GM/50ML-% IV SOLN
1.0000 g | Freq: Once | INTRAVENOUS | Status: AC
  Administered 2018-11-02: 1000 mg via INTRAVENOUS
  Filled 2018-11-02: qty 50

## 2018-11-02 MED ORDER — INSULIN ASPART 100 UNIT/ML ~~LOC~~ SOLN
0.0000 [IU] | SUBCUTANEOUS | Status: DC
  Administered 2018-11-02 (×3): 1 [IU] via SUBCUTANEOUS

## 2018-11-02 NOTE — Progress Notes (Signed)
eLink Physician-Brief Progress Note Patient Name: Aimee Parker DOB: 1950-07-13 MRN: [id number]   Date of Service  11/02/2018  HPI/Events of Note  Hypotension and respiratory insufficiency in the context of pneumonia, UTI and septic shock.  eICU Interventions  Continue antibiotics, obtain blood and urine cultures, order PICC line placement in anticipation of pressor need, meantime trial of Phenylephrine via peripheral line for persistent hypotension following 30 ml/kg iv fluid load, continue BIPAP, interval lactic acid level check.        Amare Bail U Adriana Quinby 11/02/2018, 12:00 AM

## 2018-11-02 NOTE — Progress Notes (Addendum)
PHARMACY - PHYSICIAN COMMUNICATION CRITICAL VALUE ALERT - BLOOD CULTURE IDENTIFICATION (BCID)  Aimee Parker is an 68 y.o. female who presented to [facility] on 11/01/2018 with a chief complaint of fever, malaise and fatigue.   Assessment:  3/4 BC bottles with E.coli no KPC detected  Name of physician (or Provider) Contacted: Elicia Lamp and Merlene Laughter NP  Current antibiotics: Cefepime  Changes to prescribed antibiotics recommended:  Stop Cefepime and start ceftriaxone   Results for orders placed or performed during the hospital encounter of 11/01/18  Blood Culture ID Panel (Reflexed) (Collected: 11/01/2018  3:49 PM)  Result Value Ref Range   Enterococcus species NOT DETECTED NOT DETECTED   Listeria monocytogenes NOT DETECTED NOT DETECTED   Staphylococcus species NOT DETECTED NOT DETECTED   Staphylococcus aureus (BCID) NOT DETECTED NOT DETECTED   Streptococcus species NOT DETECTED NOT DETECTED   Streptococcus agalactiae NOT DETECTED NOT DETECTED   Streptococcus pneumoniae NOT DETECTED NOT DETECTED   Streptococcus pyogenes NOT DETECTED NOT DETECTED   Acinetobacter baumannii NOT DETECTED NOT DETECTED   Enterobacteriaceae species DETECTED (A) NOT DETECTED   Enterobacter cloacae complex NOT DETECTED NOT DETECTED   Escherichia coli PENDING NOT DETECTED   Klebsiella oxytoca NOT DETECTED NOT DETECTED   Klebsiella pneumoniae NOT DETECTED NOT DETECTED   Proteus species NOT DETECTED NOT DETECTED   Serratia marcescens NOT DETECTED NOT DETECTED   Carbapenem resistance NOT DETECTED NOT DETECTED   Haemophilus influenzae NOT DETECTED NOT DETECTED   Neisseria meningitidis NOT DETECTED NOT DETECTED   Pseudomonas aeruginosa NOT DETECTED NOT DETECTED   Candida albicans NOT DETECTED NOT DETECTED   Candida glabrata NOT DETECTED NOT DETECTED   Candida krusei NOT DETECTED NOT DETECTED   Candida parapsilosis NOT DETECTED NOT DETECTED   Candida tropicalis NOT DETECTED NOT DETECTED    Phillis Haggis 11/02/2018  9:37 AM

## 2018-11-02 NOTE — Progress Notes (Signed)
[facility] Progress Note Patient Name: Aimee Parker DOB: December 05, 1950 MRN: [id number]   Date of Service  11/02/2018  HPI/Events of Note  Pt blood pressure remains soft, lactate remains high at 6.2  eICU Interventions  Albumin 5 % 500 ml iv x 1, PCCM on the ground requested to place a central line for vasopressor and fluid administration access as a PICC line cannot be placed tonight.        Kerry Kass Napolean Sia 11/02/2018, 2:18 AM

## 2018-11-02 NOTE — Procedures (Signed)
Intubation Procedure Note Aryani Daffern [id number] [birth year]  Procedure: Intubation Indications: Airway protection and maintenance, respiratory distress with increased work of breathing   Procedure Details Consent: Unable to obtain consent because of emergent medical necessity. Time Out: Verified patient identification, verified procedure, site/side was marked, verified correct patient position, special equipment/implants available, medications/allergies/relevent history reviewed, required imaging and test results available.  Performed  Maximum sterile technique was used including hand hygiene and mask.  MAC and 3 glidescope    Evaluation Hemodynamic Status: BP stable throughout; O2 sats: stable throughout Patient's Current Condition: stable Complications: No apparent complications Patient did tolerate procedure well. Chest X-ray ordered to verify placement.  CXR: pending.  Johnsie Cancel, NP-C [facility] Pgr: [phone] or if no answer [phone] 11/02/2018, 8:47 AM

## 2018-11-02 NOTE — Consult Note (Addendum)
NAME:  Aimee MoatsMai Thi Parker, MRN:  [id number], DOB:  1951-01-04, LOS: 1 ADMISSION DATE:  11/01/2018, CONSULTATION DATE:  11/02/18 REFERRING MD: Dr. Mcarthur RossettiAslam , CHIEF COMPLAINT:  sepsis   Brief History   68 y.o. F with PMH of DM and HTN who presents with 2-3 days of fever, malaise and fatigue.  She was found to have probable bilateral PNA and UTI in the ED with lactic acidosis and required Bipap, therefore PCCM was consulted.  Developed worsening respiratory distress with increased work of breathing and accessory muscle use requiring endotracheal intubation 11/02/2018     History of present illness   68 y.o. F who coming from home with PMH of DM and HTN who c/o fever, malaise, fatigue and some abdominal pain. She has been in her normal state of health until onset of symptoms and initially presented to urgent care, then to the ED and was initially febrile to 103F with tachycardia and eventually oxygen sats dropped in the 80%'s and pt was placed on Bipap.  In the ED, labs were significant for lactic acid of 4.3, WBC 17k, CO2 19 with gap of 15, creatinine 1.67 (baseline ~1.0) troponin 334 and UA significant for leuks and bacteria and +nitrite positive. She received 4L IVF, cefepime and vancomycin.  CT chest notable for bilateral basilar infiltrates and ground glass opacities. She was initially admitted to internal medicine and as BP was down-trending and she was requiring Bipap, PCCM was consulted.  On evaluation, pt is awake and responsive and denies any complaints.  Interviewed with translation assistance from family [facility](MC interpreter on the way).  She denies any chest pain, current abdominal pain, recent URI symptoms, diarrhea or other complaints.  No recent UTI's or hospitalizations.   Past Medical History  Vitamin D deficiency  Hypertension  Type 2 diabetes   Significant Hospital Events   [facility] Worsening respiratory distress requiring intubation   Consults:    Procedures:  EET  10/21 >>   Significant Diagnostic Tests:  10/20 CT chest >> Patchy airspace areas of consolidation in the posterior lower lungs with ground-glass opacities in the upper lungs   10/20 CT abd/pelvis >> Heterogeneous left renal nephrogram with heterogenous striated appearance consistent with pyelonephritis, moderate left perinephritic edema and mild left ureteral prominence. Wall thickening of splenic flexure of colon likely due to renal inflammation   Micro Data:  10/20 Sars-CoV-2 >> neg 10/20 Blood culture >> Gram negative rods  10/20 UC  >> 10/21 RVP  >> neg  Antimicrobials:  Vancomycin 10/20 >> 10/21 Cefepime 10/20 >>   Interim history/subjective:  Sitting up in bed with increased work of breathing and significant tachypnea, due to language barrier unable to assess subjective dyspnea. Pressor support stopped early morning   Objective   Blood pressure (!) 103/54, pulse (!) 112, temperature (!) 101 F (38.3 C), temperature source Oral, resp. rate 20, height 5\' 4"  (1.626 m), weight 68.2 kg, SpO2 99 %.    Vent Mode: PRVC FiO2 (%):  [30 %-100 %] 100 % Set Rate:  [20 bmp] 20 bmp Vt Set:  [430 mL] 430 mL PEEP:  [5 cmH20] 5 cmH20 Plateau Pressure:  [21 cmH20] 21 cmH20   Intake/Output Summary (Last 24 hours) at 11/02/2018 0851 Last data filed at 11/02/2018 0600 Gross per 24 hour  Intake 2565.59 ml  Output 150 ml  Net 2415.59 ml   Filed Weights   11/01/18 1527 11/02/18 0404  Weight: 61.2 kg 68.2 kg   Physical Exam  General: Chronically ill appearing elderly female now on mechanical ventilation, in NAD post intubation  HEENT: ETT, MM pink/dry, PERRL,  Neuro: post intubation sedated, prior to sedation appeared alert and oriented with no focal neuro deficits  CV: s1s2 regular rate and rhythm, no murmur, rubs, or gallops,  PULM:  coarse breath sounds and slight rhonchi in bilateral lung fields  GI: soft, bowel sounds hypoactive, non-tender, slightly distended, OG tube placed  Extremities: warm/dry, no edema  Skin: no rashes or lesions  Resolved Hospital Problem list     Assessment & Plan:   Acute hypoxic respiratory failure  -Seen with increased with of breathing with significant tachypnea and accessory muscle use prompting need for intubation  P: Continue vent support PRVC 8cc/kg Wean PEEP and FiO2 for sats greater than 92% Continue IV antibiotics  VAP bundle in place  Follow cultures HOB elevated  Daily SBT trial   Severe Sepsis due to E-Coli bacteremia  Left complicated pyelonephritis  -likely secondary to UTI with PNA P: Repeat lactic now  Blood cultures with enterobacteriaceae  CXR post intubation, retract ETT tube 3cm ABG 1 hr post intubation  Follow urine culture  Continue antibiotics  Continue IV hydration   Metabolic Acidosis -suspect this is secondary to renal injury and lactic acidosis P: Trend BMP Continue IVF Follow lactic acid  May require bicarb drip  AKI -Creatinine 1.67, suspect this is pre-renal secondary to sepsis and insensible losses from fever. Baseline creatinine 0.70-0.90 P: Ensure adequate renal perfusion Avoid nephrotoxins IV hydration   Elevated Troponin -Trop 762>831 -suspect secondary to demand ischemia from sepsis, no signs of ACS on initial EKG P: Troponin trending down  No ST elevation or depression on EKG ECHO with preserved EF of 60-65%,   Hypokalemia/Hypomagnesemia  -Given K and Magnesium 2g Iv overnight P Trend Bmet   Best practice:  Diet: NPO Pain/Anxiety/Delirium protocol (if indicated): intermittent Fentanyl VAP protocol (if indicated): in place  DVT prophylaxis: heparin, SCD's GI prophylaxis: PPI Glucose control: SSI Mobility:Bedrest  Code Status: Full Family Communication: No family at bedside will attempt to update today  Disposition: ICU  Labs   CBC: Recent Labs  Lab 11/01/18 1535 11/01/18 2039 11/02/18 0352 11/02/18 0439  WBC 17.0*  --  17.1*  --   NEUTROABS  15.5*  --   --   --   HGB 11.0* 9.5* 8.9* 9.2*  HCT 33.6* 28.0* 28.2* 27.0*  MCV 69.3*  --  70.0*  --   PLT 179  --  111*  --     Basic Metabolic Panel: Recent Labs  Lab 11/01/18 1535 11/01/18 2039 11/01/18 2200 11/01/18 2306 11/02/18 0352 11/02/18 0439  NA 135 137  --  137 137 139  K 3.4* 3.7  --  3.4* 3.5 3.9  CL 101  --   --  107 106  --   CO2 19*  --   --  17* 18*  --   GLUCOSE 196*  --   --  113* 155*  --   BUN 29*  --   --  28* 31*  --   CREATININE 1.67*  --   --  1.63* 1.96*  --   CALCIUM 9.2  --   --  7.9* 8.1*  --   MG  --   --  1.2*  --  2.0  --    GFR: Estimated Creatinine Clearance: 26.1 mL/min (A) (by C-G formula based on SCr of 1.96 mg/dL (H)). Recent Labs  Lab  11/01/18 1535 11/01/18 1600 11/01/18 2306 11/02/18 0352 11/02/18 0620  WBC 17.0*  --   --  17.1*  --   LATICACIDVEN  --  4.5* 6.2* 4.6* 5.2*    Liver Function Tests: Recent Labs  Lab 11/01/18 1535  AST 37  ALT 33  ALKPHOS 68  BILITOT 1.0  PROT 7.6  ALBUMIN 3.6   No results for input(s): LIPASE, AMYLASE in the last 168 hours. No results for input(s): AMMONIA in the last 168 hours.  ABG    Component Value Date/Time   PHART 7.330 (L) 11/02/2018 0439   PCO2ART 30.2 (L) 11/02/2018 0439   PO2ART 102.0 11/02/2018 0439   HCO3 15.9 (L) 11/02/2018 0439   TCO2 17 (L) 11/02/2018 0439   ACIDBASEDEF 9.0 (H) 11/02/2018 0439   O2SAT 97.0 11/02/2018 0439     Coagulation Profile: No results for input(s): INR, PROTIME in the last 168 hours.  Cardiac Enzymes: No results for input(s): CKTOTAL, CKMB, CKMBINDEX, TROPONINI in the last 168 hours.  HbA1C: Hemoglobin A1C  Date/Time Value Ref Range Status  08/30/2018 10:15 AM 7.7 (A) 4.0 - 5.6 % Final  05/20/2018 09:25 AM 9.4 (A) 4.0 - 5.6 % Final   HbA1c, POC (controlled diabetic range)  Date/Time Value Ref Range Status  01/17/2018 10:27 AM 8.3 (A) 0.0 - 7.0 % Corrected   Hgb A1c MFr Bld  Date/Time Value Ref Range Status  11/02/2018 03:52  AM 8.1 (H) 4.8 - 5.6 % Final    Comment:    (NOTE) Pre diabetes:          5.7%-6.4% Diabetes:              >6.4% Glycemic control for   <7.0% adults with diabetes   05/23/2015 11:21 AM 9.6 (H) <5.7 % Final    Comment:      For someone without known diabetes, a hemoglobin A1c value of 6.5% or greater indicates that they may have diabetes and this should be confirmed with a follow-up test.   For someone with known diabetes, a value <7% indicates that their diabetes is well controlled and a value greater than or equal to 7% indicates suboptimal control. A1c targets should be individualized based on duration of diabetes, age, comorbid conditions, and other considerations.   Currently, no consensus exists for use of hemoglobin A1c for diagnosis of diabetes for children.       CBG: Recent Labs  Lab 11/01/18 1442 11/02/18 0356 11/02/18 0746  GLUCAP 177* 144* 121*    Critical care time: 45 minutes     CRITICAL CARE Performed by: Johnsie Cancel   Total critical care time: 45 minutes  Critical care time was exclusive of separately billable procedures and treating other patients.  Critical care was necessary to treat or prevent imminent or life-threatening deterioration.  Critical care was time spent personally by me on the following activities: development of treatment plan with patient and/or surrogate as well as nursing, discussions with consultants, evaluation of patient's response to treatment, examination of patient, obtaining history from patient or surrogate, ordering and performing treatments and interventions, ordering and review of laboratory studies, ordering and review of radiographic studies, pulse oximetry and re-evaluation of patient's condition.    Johnsie Cancel, NP-C [place] Pgr: [phone] or if no answer [phone] 11/02/2018, 9:50 AM  Attending Note:  68 year old female who presents with bilateral pneumonia, UTI and  respiratory failure.  PCCM was consulted for respiratory failure requiring BiPAP.  Overnight, continued to have fever, AMS and desaturation.  On exam, RR of 40-50 on BiPAP with frequent desaturation.  AMS, concern for inability to protect her airway.  I reviewed CXR myself, infiltrate noted.  Discussed with PCCM-NP.  Will proceed with intubation.  Full vent support.  IVF and pressors as needed.  Abx to broad spectrum.  ABG.  Adjust vent for ABG.  PCCM will assume care.  The patient is critically ill with multiple organ systems failure and requires high complexity decision making for assessment and support, frequent evaluation and titration of therapies, application of advanced monitoring technologies and extensive interpretation of multiple databases.   Critical Care Time devoted to patient care services described in this note is  32  Minutes. This time reflects time of care of this signee Dr Koren Bound. This critical care time does not reflect procedure time, or teaching time or supervisory time of PA/NP/Med student/Med Resident etc but could involve care discussion time.  Alyson Reedy, M.D. [facility]. Pager: [phone]. After hours pager: [phone].

## 2018-11-02 NOTE — Progress Notes (Signed)
Transported patient to and from CT without any complications on Bipap.  Patient vitals stable throughout.  Will continue to monitor.

## 2018-11-02 NOTE — Progress Notes (Signed)
NP for CCM notified of patient b/p and breathing. NP in to see patient orders given. LR bolus 1 liter started medicated as ordered. Pharmacy notified for meds ordered.

## 2018-11-02 NOTE — Progress Notes (Signed)
Echocardiogram 2D Echocardiogram has been performed.  Oneal Deputy Saahil Herbster 11/02/2018, 8:17 AM

## 2018-11-02 NOTE — Progress Notes (Signed)
[place] Progress Note Patient Name: Aimee Parker DOB: [birth year] MRN: [id number]   Date of Service  11/02/2018  HPI/Events of Note  Pt c/o pain despite Tylenol order, CT abdomen indicates acute pyelonephritis.  eICU Interventions  Fentanyl 12.5 mg iv Q 4 hours prn pain not relieved by Tylenol ordered.        Kerry Kass Ogan 11/02/2018, 5:41 AM

## 2018-11-02 NOTE — Progress Notes (Signed)
Due to positive blood cultures.  Rn to reach out to MD for CVAD placement instead of PICC.  PICC RN to follow up

## 2018-11-02 NOTE — Procedures (Signed)
Central Venous Catheter Insertion Procedure Note Takelia Urieta [id number] 1950/02/21  Procedure: Insertion of Central Venous Catheter Indications: Drug and/or fluid administration  Procedure Details Consent: Risks of procedure as well as the alternatives and risks of each were explained to the (patient/caregiver).  Consent for procedure obtained. Time Out: Verified patient identification, verified procedure, site/side was marked, verified correct patient position, special equipment/implants available, medications/allergies/relevent history reviewed, required imaging and test results available.  Performed  Maximum sterile technique was used including antiseptics, cap, gloves, gown, hand hygiene, mask and sheet. Skin prep: Chlorhexidine; local anesthetic administered A antimicrobial bonded/coated triple lumen catheter was placed in the left internal jugular vein using the Seldinger technique and confirmed with [place].  Evaluation Blood flow good Complications: No apparent complications Patient did tolerate procedure well. Chest X-ray ordered to verify placement.  CXR: pending.  Otilio Carpen Lorine Iannaccone 11/02/2018, 10:18 PM

## 2018-11-02 NOTE — Procedures (Signed)
Arterial Catheter Insertion Procedure Note Jahlisa Rossitto [id number] 1950-07-29  Procedure: Insertion of Arterial Catheter  Indications: Blood pressure monitoring  Procedure Details Consent: Risks of procedure as well as the alternatives and risks of each were explained to the (patient/caregiver).  Consent for procedure obtained. Time Out: Verified patient identification, verified procedure, site/side was marked, verified correct patient position, special equipment/implants available, medications/allergies/relevent history reviewed, required imaging and test results available.  Performed  Maximum sterile technique was used including antiseptics, cap, gloves, gown, hand hygiene, mask and sheet. Skin prep: Chlorhexidine; local anesthetic administered 20 gauge catheter was inserted into left radial artery using the Seldinger technique. ULTRASOUND GUIDANCE USED: YES Evaluation Blood flow good; BP tracing good. Complications: No apparent complications.   Georgann Housekeeper, AGACNP-BC [place] Pager [phone] or [phone]  11/02/2018 10:18 PM

## 2018-11-03 ENCOUNTER — Inpatient Hospital Stay (HOSPITAL_COMMUNITY): Payer: Medicare Other

## 2018-11-03 DIAGNOSIS — J9601 Acute respiratory failure with hypoxia: Secondary | ICD-10-CM | POA: Diagnosis not present

## 2018-11-03 DIAGNOSIS — J189 Pneumonia, unspecified organism: Secondary | ICD-10-CM | POA: Diagnosis not present

## 2018-11-03 DIAGNOSIS — A419 Sepsis, unspecified organism: Secondary | ICD-10-CM | POA: Diagnosis not present

## 2018-11-03 DIAGNOSIS — R6521 Severe sepsis with septic shock: Secondary | ICD-10-CM | POA: Diagnosis not present

## 2018-11-03 LAB — CBC
HCT: 25.3 % — ABNORMAL LOW (ref 36.0–46.0)
Hemoglobin: 8.2 g/dL — ABNORMAL LOW (ref 12.0–15.0)
MCH: 22.3 pg — ABNORMAL LOW (ref 26.0–34.0)
MCHC: 32.4 g/dL (ref 30.0–36.0)
MCV: 68.8 fL — ABNORMAL LOW (ref 80.0–100.0)
Platelets: 57 10*3/uL — ABNORMAL LOW (ref 150–400)
RBC: 3.68 MIL/uL — ABNORMAL LOW (ref 3.87–5.11)
RDW: 15.4 % (ref 11.5–15.5)
WBC: 13.6 10*3/uL — ABNORMAL HIGH (ref 4.0–10.5)
nRBC: 0.1 % (ref 0.0–0.2)

## 2018-11-03 LAB — GLUCOSE, CAPILLARY
Glucose-Capillary: 107 mg/dL — ABNORMAL HIGH (ref 70–99)
Glucose-Capillary: 108 mg/dL — ABNORMAL HIGH (ref 70–99)
Glucose-Capillary: 87 mg/dL (ref 70–99)
Glucose-Capillary: 91 mg/dL (ref 70–99)
Glucose-Capillary: 93 mg/dL (ref 70–99)
Glucose-Capillary: 96 mg/dL (ref 70–99)

## 2018-11-03 LAB — BASIC METABOLIC PANEL
Anion gap: 13 (ref 5–15)
BUN: 38 mg/dL — ABNORMAL HIGH (ref 8–23)
CO2: 12 mmol/L — ABNORMAL LOW (ref 22–32)
Calcium: 7.2 mg/dL — ABNORMAL LOW (ref 8.9–10.3)
Chloride: 115 mmol/L — ABNORMAL HIGH (ref 98–111)
Creatinine, Ser: 1.99 mg/dL — ABNORMAL HIGH (ref 0.44–1.00)
GFR calc Af Amer: 29 mL/min — ABNORMAL LOW (ref >60)
GFR calc non Af Amer: 25 mL/min — ABNORMAL LOW (ref >60)
Glucose, Bld: 92 mg/dL (ref 70–99)
Potassium: 3.9 mmol/L (ref 3.5–5.1)
Sodium: 140 mmol/L (ref 135–145)

## 2018-11-03 LAB — POCT I-STAT 7, (LYTES, BLD GAS, ICA,H+H)
Acid-base deficit: 15 mmol/L — ABNORMAL HIGH (ref 0.0–2.0)
Bicarbonate: 11 mmol/L — ABNORMAL LOW (ref 20.0–28.0)
Calcium, Ion: 1.07 mmol/L — ABNORMAL LOW (ref 1.15–1.40)
HCT: 26 % — ABNORMAL LOW (ref 36.0–46.0)
Hemoglobin: 8.8 g/dL — ABNORMAL LOW (ref 12.0–15.0)
O2 Saturation: 96 %
Patient temperature: 36.6
Potassium: 4.1 mmol/L (ref 3.5–5.1)
Sodium: 142 mmol/L (ref 135–145)
TCO2: 12 mmol/L — ABNORMAL LOW (ref 22–32)
pCO2 arterial: 24 mmHg — ABNORMAL LOW (ref 32.0–48.0)
pH, Arterial: 7.266 — ABNORMAL LOW (ref 7.350–7.450)
pO2, Arterial: 93 mmHg (ref 83.0–108.0)

## 2018-11-03 LAB — BETA-HYDROXYBUTYRIC ACID: Beta-Hydroxybutyric Acid: 5.24 mmol/L — ABNORMAL HIGH (ref 0.05–0.27)

## 2018-11-03 LAB — LACTIC ACID, PLASMA: Lactic Acid, Venous: 2.6 mmol/L (ref 0.5–1.9)

## 2018-11-03 MED ORDER — ACETAMINOPHEN 325 MG PO TABS
650.0000 mg | ORAL_TABLET | ORAL | Status: DC | PRN
  Administered 2018-11-03 (×2): 650 mg
  Filled 2018-11-03 (×3): qty 2

## 2018-11-03 MED ORDER — LACTATED RINGERS IV SOLN
INTRAVENOUS | Status: DC
  Administered 2018-11-03 – 2018-11-04 (×4): via INTRAVENOUS

## 2018-11-03 MED ORDER — FENTANYL 2500MCG IN NS 250ML (10MCG/ML) PREMIX INFUSION
0.0000 ug/h | INTRAVENOUS | Status: DC
  Administered 2018-11-03: 04:00:00 25 ug/h via INTRAVENOUS
  Administered 2018-11-04 – 2018-11-05 (×2): 125 ug/h via INTRAVENOUS
  Administered 2018-11-06 – 2018-11-07 (×3): 150 ug/h via INTRAVENOUS
  Administered 2018-11-08: 200 ug/h via INTRAVENOUS
  Administered 2018-11-09: 20:00:00 100 ug/h via INTRAVENOUS
  Administered 2018-11-09 – 2018-11-10 (×2): 300 ug/h via INTRAVENOUS
  Filled 2018-11-03 (×10): qty 250

## 2018-11-03 MED ORDER — SODIUM BICARBONATE 8.4 % IV SOLN
50.0000 meq | Freq: Once | INTRAVENOUS | Status: AC
  Administered 2018-11-03: 09:00:00 50 meq via INTRAVENOUS

## 2018-11-03 MED ORDER — SODIUM BICARBONATE 8.4 % IV SOLN
INTRAVENOUS | Status: AC
  Administered 2018-11-03: 50 meq via INTRAVENOUS
  Filled 2018-11-03: qty 50

## 2018-11-03 NOTE — Progress Notes (Signed)
ABG results and bradycardic episode reported to NP Whitney, no new orders at this time, will continue to monitor.

## 2018-11-03 NOTE — Progress Notes (Signed)
Blood gas results given to RN.

## 2018-11-03 NOTE — Progress Notes (Signed)
[place] Progress Note Patient Name: Aimee Parker DOB: Jan 24, 1950 MRN: [id number]   Date of Service  11/03/2018  HPI/Events of Note  Pt needs a.m. labs  eICU Interventions  ABG, CXR, and serum lactic acid ordered,        Simcha Speir U Nollie Shiflett 11/03/2018, 5:12 AM

## 2018-11-03 NOTE — Progress Notes (Signed)
Initial Nutrition Assessment  DOCUMENTATION CODES:   Not applicable  INTERVENTION:   Tube feeding recommendations: - Vital AF 1.2 @ 55 ml/hr (1320 ml/day)  Recommended tube feeding regimen provides 1584 kcal, 99 grams of protein, and 1071 ml of H2O (97% of needs).  NUTRITION DIAGNOSIS:   Inadequate oral intake related to inability to eat as evidenced by NPO status.  GOAL:   Patient will meet greater than or equal to 90% of their needs  MONITOR:   Vent status, Labs, Weight trends, I & O's  REASON FOR ASSESSMENT:   Ventilator    ASSESSMENT:   68 year old female who presented to the ED on 10/20 with fever and weakness. PMH of HTN, T2DM. Pt found to have probable bilateral PNA and UTI and admitted with sepsis.   10/21 - intubated  Discussed pt with CCM. Plan is to hold on starting TF today due to fair amount of gastric output overnight and ongoing abdominal distention. RD to leave TF recommendations.  Spoke with RN at bedside.  OG tube in place.  EDW: 68.2 kg (BMI 25.8)  Patient is currently intubated on ventilator support MV: 9.4 L/min Temp (24hrs), Avg:100.8 F (38.2 C), Min:98.2 F (36.8 C), Max:104 F (40 C) BP (a-line): 108/56 MAP (a-line): 71  Drips: Precedex: 13.6 ml/hr Fentanyl: 5 ml/hr Levophed: 33.6 ml/hr LR: 125 ml/hr  Medications reviewed and include: SSI q 4 hours, Protonix, IV abx  Labs reviewed: BUN 38,m, creatinine 1.99, hemoglobin 8.2 CBG's: 78-137 x 24 hours  UOP: 791 ml x 24 hours OG tube: 220 ml x 24 hours I/O's: +6.8 L since admit  NUTRITION - FOCUSED PHYSICAL EXAM:    Most Recent Value  Orbital Region  No depletion  Upper Arm Region  No depletion  Thoracic and Lumbar Region  No depletion  Buccal Region  No depletion  Temple Region  No depletion  Clavicle Bone Region  No depletion  Clavicle and Acromion Bone Region  No depletion  Scapular Bone Region  No depletion  Dorsal Hand  No depletion  Patellar Region  No depletion   Anterior Thigh Region  No depletion  Posterior Calf Region  No depletion  Edema (RD Assessment)  Mild [BUE, BLE]  Hair  Reviewed  Eyes  Unable to assess  Mouth  Unable to assess  Skin  Reviewed  Nails  Reviewed       Diet Order:   Diet Order            Diet NPO time specified  Diet effective now              EDUCATION NEEDS:   No education needs have been identified at this time  Skin:  Skin Assessment: Reviewed RN Assessment  Last BM:  11/02/18  Height:   Ht Readings from Last 1 Encounters:  11/01/18 5\' 4"  (1.626 m)    Weight:   Wt Readings from Last 1 Encounters:  11/03/18 71.4 kg    Ideal Body Weight:  54.5 kg  BMI:  Body mass index is 27.02 kg/m.  Estimated Nutritional Needs:   Kcal:  3664  Protein:  90-110 grams  Fluid:  >/= 1.6 L    Gaynell Face, MS, RD, LDN Inpatient Clinical Dietitian Pager: [phone] Weekend/After Hours: [phone]

## 2018-11-03 NOTE — Progress Notes (Signed)
Unable to obtain sputum specimen. During insertion of inline suction catheter, pt's HR dropped to low 30s. Catheter pulled back out, RN alerted of bradycardia. Pt's HR came back up 65 after a few seconds. RT will continue to monitor pt.

## 2018-11-03 NOTE — Progress Notes (Signed)
[place] Progress Note Patient Name: Aimee Parker DOB: January 04, 1951 MRN: [id number]   Date of Service  11/03/2018  HPI/Events of Note  Elevated blood pressure in the context of sub-optimal sedation and ventricular dyssynchrony  eICU Interventions  Fentanyl infusion ordered.        Kerry Kass Handy Mcloud 11/03/2018, 3:30 AM

## 2018-11-03 NOTE — Consult Note (Addendum)
NAME:  Aimee Parker, MRN:  [id number], DOB:  [birth year], LOS: 2 ADMISSION DATE:  11/01/2018, CONSULTATION DATE:  11/03/18 REFERRING MD: Dr. Mcarthur RossettiAslam , CHIEF COMPLAINT:  sepsis   Brief History   68 y.o. F with PMH of DM and HTN who presents with 2-3 days of fever, malaise and fatigue.  She was found to have probable bilateral PNA and UTI in the ED with lactic acidosis and required Bipap, therefore PCCM was consulted.  Developed worsening respiratory distress with increased work of breathing and accessory muscle use requiring endotracheal intubation 11/02/2018     History of present illness   68 y.o. F who coming from home with PMH of DM and HTN who c/o fever, malaise, fatigue and some abdominal pain. She has been in her normal state of health until onset of symptoms and initially presented to urgent care, then to the ED and was initially febrile to 103F with tachycardia and eventually oxygen sats dropped in the 80%'s and pt was placed on Bipap.  In the ED, labs were significant for lactic acid of 4.3, WBC 17k, CO2 19 with gap of 15, creatinine 1.67 (baseline ~1.0) troponin 334 and UA significant for leuks and bacteria and +nitrite positive. She received 4L IVF, cefepime and vancomycin.  CT chest notable for bilateral basilar infiltrates and ground glass opacities. She was initially admitted to internal medicine and as BP was down-trending and she was requiring Bipap, PCCM was consulted.  On initial evaluation, pt is awake and responsive and denies any complaints.  Interviewed with translation assistance from family [facility] interpreter on the way).  She denies any chest pain, current abdominal pain, recent URI symptoms, diarrhea or other complaints.  No recent UTI's or hospitalizations.   Past Medical History  Vitamin D deficiency  Hypertension  Type 2 diabetes   Significant Hospital Events   [facility] Worsening respiratory distress requiring intubation   Consults:     Procedures:  EET 10/21 >>  A-line 10/21 >> Left IJ CVC 10/21 >>  Significant Diagnostic Tests:  10/20 CT chest >> Patchy airspace areas of consolidation in the posterior lower lungs with ground-glass opacities in the upper lungs   10/20 CT abd/pelvis >> Heterogeneous left renal nephrogram with heterogenous striated appearance consistent with pyelonephritis, moderate left perinephritic edema and mild left ureteral prominence. Wall thickening of splenic flexure of colon likely due to renal inflammation   Micro Data:  10/20 Sars-CoV-2 >> neg 10/20 Blood culture >>  Enterobacteriaceae and Ecoli  10/20 UC  >> 10/21 RVP  >> neg 10/22 Sputum Culture >>  Antimicrobials:  Vancomycin 10/20 >> 10/21 Cefepime 10/20 >>   Interim history/subjective:  RN reports continued fever overnight with requirement of fentanyl drip for sedation overnight as well as placement of A-line and CVC. Minimal urine output overnight, net positive 3L over the last 24hrs  Objective   Blood pressure (!) 105/54, pulse 75, temperature (!) 102.7 F (39.3 C), resp. rate (!) 26, height 5\' 4"  (1.626 m), weight 71.4 kg, SpO2 100 %.    Vent Mode: PRVC FiO2 (%):  [70 %-100 %] 80 % Set Rate:  [20 bmp-24 bmp] 24 bmp Vt Set:  [430 mL] 430 mL PEEP:  [5 cmH20] 5 cmH20 Plateau Pressure:  [16 cmH20-21 cmH20] 20 cmH20   Intake/Output Summary (Last 24 hours) at 11/03/2018 0800 Last data filed at 11/03/2018 0600 Gross per 24 hour  Intake 4835.95 ml  Output 1011 ml  Net 3824.95 ml  Filed Weights   11/01/18 1527 11/02/18 0404 11/03/18 0500  Weight: 61.2 kg 68.2 kg 71.4 kg   Physical Exam  General: Chronically ill appearing elderly female on mechanical ventilation, in NAD HEENT: ETT, MM pink/moist, PERRL,  Neuro: Sedated on vent, unable to assess mentation  CV: s1s2 regular rate and rhythm, no murmur, rubs, or gallops,  PULM:  Clear to ascultation bilaterally no increased work of breathing  GI: soft, bowel sounds active  in all 4 quadrants, non-tender, non-distended, OG to LIWS Extremities: warm/dry, 1+ edema  Skin: no rashes or lesions   Resolved Hospital Problem list     Assessment & Plan:   Acute hypoxic respiratory failure  -Seen with increased with of breathing with significant tachypnea and accessory muscle use prompting need for intubation  P: Continue vent support PRVC 8cc/kg, increase PEEP 8 Daily SBT trail if applicable  Wean PEEP and FiO2 for sats greater then 92% Continue IV antibiotics  VAP bundle in place  Obtain sputum culture   Severe Sepsis due to E-Coli bacteremia  Left complicated pyelonephritis  -Secondary to UTI with PNA P: Trend lactic  Blood cultures positive for Enterobacteriaceae and Ecoli  Intermittent CXR and ABG Follow urine culture  Continue antibiotics  IV hydration   Metabolic Acidosis -suspect this is secondary to renal injury and lactic acidosis P: Trend BMP Change IV hydration to LR Lactic acid trending down  Repeat dosing of bicarb   AKI -Creatinine 1.67, suspect this is pre-renal secondary to sepsis and insensible losses from fever. Baseline creatinine 0.70-0.90 P: Continue IV hydration  Monitor urine output Avoid nephrotoxins Continue IV hydration   Thrombocytopenia  -Platelet count has consistently dropped during admission 179 > 111 > 57, likely secondary to sever sepsis and bacteremia  P: Discontinue Subq heparin Closely monitor platelet count Consider initiating of HIT panel   Elevated Troponin -Trop 161>096 -suspect secondary to demand ischemia from sepsis, no signs of ACS on initial EKG P: No EKG changes seen ECHO with preserved EF Continuous telemetry   Hypokalemia/Hypomagnesemia  -Given K and Magnesium 2g Iv overnight P Trend Bmet Supplementation as needed   Best practice:  Diet: NPO Pain/Anxiety/Delirium protocol (if indicated): Fentanyl drip  VAP protocol (if indicated): in place  DVT prophylaxis: heparin,  SCD's GI prophylaxis: PPI Glucose control: SSI Mobility:Bedrest  Code Status: Full Family Communication: Mother update at bedside extensively 10/21  Disposition: ICU  Labs   CBC: Recent Labs  Lab 11/01/18 1535  11/02/18 0352 11/02/18 0439 11/02/18 1001 11/02/18 1759 11/03/18 0520  WBC 17.0*  --  17.1*  --   --   --  13.6*  NEUTROABS 15.5*  --   --   --   --   --   --   HGB 11.0*   < > 8.9* 9.2* 9.2* 8.5* 8.2*  HCT 33.6*   < > 28.2* 27.0* 27.0* 25.0* 25.3*  MCV 69.3*  --  70.0*  --   --   --  68.8*  PLT 179  --  111*  --   --   --  57*   < > = values in this interval not displayed.    Basic Metabolic Panel: Recent Labs  Lab 11/01/18 1535  11/01/18 2200 11/01/18 2306 11/02/18 0352 11/02/18 0439 11/02/18 1001 11/02/18 1759 11/03/18 0520  NA 135   < >  --  137 137 139 139 140 140  K 3.4*   < >  --  3.4* 3.5 3.9 3.7 4.0  3.9  CL 101  --   --  107 106  --   --   --  115*  CO2 19*  --   --  17* 18*  --   --   --  12*  GLUCOSE 196*  --   --  113* 155*  --   --   --  92  BUN 29*  --   --  28* 31*  --   --   --  38*  CREATININE 1.67*  --   --  1.63* 1.96*  --   --   --  1.99*  CALCIUM 9.2  --   --  7.9* 8.1*  --   --   --  7.2*  MG  --   --  1.2*  --  2.0  --   --   --   --    < > = values in this interval not displayed.   GFR: Estimated Creatinine Clearance: 26.2 mL/min (A) (by C-G formula based on SCr of 1.99 mg/dL (H)). Recent Labs  Lab 11/01/18 1535  11/02/18 0352 11/02/18 0620 11/02/18 1815 11/02/18 2158 11/03/18 0520  WBC 17.0*  --  17.1*  --   --   --  13.6*  LATICACIDVEN  --    < > 4.6* 5.2* 4.1* 3.7* 2.6*   < > = values in this interval not displayed.    Liver Function Tests: Recent Labs  Lab 11/01/18 1535  AST 37  ALT 33  ALKPHOS 68  BILITOT 1.0  PROT 7.6  ALBUMIN 3.6   No results for input(s): LIPASE, AMYLASE in the last 168 hours. No results for input(s): AMMONIA in the last 168 hours.  ABG    Component Value Date/Time   PHART 7.343  (L) 11/03/2018 0520   PCO2ART 23.5 (L) 11/03/2018 0520   PO2ART 87.0 11/03/2018 0520   HCO3 12.4 (L) 11/03/2018 0520   TCO2 14 (L) 11/02/2018 1759   ACIDBASEDEF 12.2 (H) 11/03/2018 0520   O2SAT 95.5 11/03/2018 0520     Coagulation Profile: No results for input(s): INR, PROTIME in the last 168 hours.  Cardiac Enzymes: No results for input(s): CKTOTAL, CKMB, CKMBINDEX, TROPONINI in the last 168 hours.  HbA1C: Hemoglobin A1C  Date/Time Value Ref Range Status  08/30/2018 10:15 AM 7.7 (A) 4.0 - 5.6 % Final  05/20/2018 09:25 AM 9.4 (A) 4.0 - 5.6 % Final   HbA1c, POC (controlled diabetic range)  Date/Time Value Ref Range Status  01/17/2018 10:27 AM 8.3 (A) 0.0 - 7.0 % Corrected   Hgb A1c MFr Bld  Date/Time Value Ref Range Status  11/02/2018 03:52 AM 8.1 (H) 4.8 - 5.6 % Final    Comment:    (NOTE) Pre diabetes:          5.7%-6.4% Diabetes:              >6.4% Glycemic control for   <7.0% adults with diabetes   05/23/2015 11:21 AM 9.6 (H) <5.7 % Final    Comment:      For someone without known diabetes, a hemoglobin A1c value of 6.5% or greater indicates that they may have diabetes and this should be confirmed with a follow-up test.   For someone with known diabetes, a value <7% indicates that their diabetes is well controlled and a value greater than or equal to 7% indicates suboptimal control. A1c targets should be individualized based on duration of diabetes, age, comorbid conditions, and other considerations.  Currently, no consensus exists for use of hemoglobin A1c for diagnosis of diabetes for children.       CBG: Recent Labs  Lab 11/02/18 1133 11/02/18 1528 11/02/18 1940 11/02/18 2337 11/03/18 0422  GLUCAP 137* 113* 78 92 93    Critical care time: 45 minutes     CRITICAL CARE Performed by: Johnsie Cancel   Total critical care time: 40 minutes  Critical care time was exclusive of separately billable procedures and treating other patients.   Critical care was necessary to treat or prevent imminent or life-threatening deterioration.  Critical care was time spent personally by me on the following activities: development of treatment plan with patient and/or surrogate as well as nursing, discussions with consultants, evaluation of patient's response to treatment, examination of patient, obtaining history from patient or surrogate, ordering and performing treatments and interventions, ordering and review of laboratory studies, ordering and review of radiographic studies, pulse oximetry and re-evaluation of patient's condition.   Johnsie Cancel, NP-C [facility] Contact information can be found on Amion  After hours pager: [phone]. 11/03/2018, 9:23 AM  Attending Note:  68 year old female with PMH of DM and HTN presenting with PNA and UTI.  Patient was unable to protect her airway and hypoxemic and was intubated on 10/21.  Overnight, desaturation requiring higher FiO2.  Patient is very dyssynchronous this AM with the vent on exam, with coarse BS diffusely.  I reviewed CXR myself, ETT is in a good position and infiltrate noted.  Changed to PCV this AM and appears improved.  Will maintain on current vent settings for now.  Abx as ordered.  F/u on cultures.  Replace electrolytes.  Begin TF.  PCCM will continue to follow.  The patient is critically ill with multiple organ systems failure and requires high complexity decision making for assessment and support, frequent evaluation and titration of therapies, application of advanced monitoring technologies and extensive interpretation of multiple databases.   Critical Care Time devoted to patient care services described in this note is  33  Minutes. This time reflects time of care of this signee Dr Jennet Maduro. This critical care time does not reflect procedure time, or teaching time or supervisory time of PA/NP/Med student/Med Resident etc but could involve care discussion  time.  Rush Farmer, M.D. [facility] Pulmonary/Critical Care Medicine. Pager: [phone]. After hours pager: [phone].

## 2018-11-04 ENCOUNTER — Inpatient Hospital Stay (HOSPITAL_COMMUNITY): Payer: Medicare Other

## 2018-11-04 DIAGNOSIS — J810 Acute pulmonary edema: Secondary | ICD-10-CM

## 2018-11-04 DIAGNOSIS — J189 Pneumonia, unspecified organism: Secondary | ICD-10-CM | POA: Diagnosis not present

## 2018-11-04 DIAGNOSIS — A419 Sepsis, unspecified organism: Secondary | ICD-10-CM | POA: Diagnosis not present

## 2018-11-04 DIAGNOSIS — J9601 Acute respiratory failure with hypoxia: Secondary | ICD-10-CM | POA: Diagnosis not present

## 2018-11-04 LAB — BLOOD GAS, ARTERIAL
Acid-base deficit: 12.2 mmol/L — ABNORMAL HIGH (ref 0.0–2.0)
Bicarbonate: 12.4 mmol/L — ABNORMAL LOW (ref 20.0–28.0)
FIO2: 90
MECHVT: 430 mL
O2 Saturation: 95.5 %
PEEP: 5 cmH2O
Patient temperature: 98.6
RATE: 24 resp/min
pCO2 arterial: 26.8 mmHg — ABNORMAL LOW (ref 32.0–48.0)
pH, Arterial: 7.305 — ABNORMAL LOW (ref 7.350–7.450)
pO2, Arterial: 102 mmHg (ref 83.0–108.0)

## 2018-11-04 LAB — BASIC METABOLIC PANEL
Anion gap: 14 (ref 5–15)
Anion gap: 14 (ref 5–15)
Anion gap: 14 (ref 5–15)
Anion gap: 12 (ref 5–15)
BUN: 39 mg/dL — ABNORMAL HIGH (ref 8–23)
BUN: 34 mg/dL — ABNORMAL HIGH (ref 8–23)
BUN: 32 mg/dL — ABNORMAL HIGH (ref 8–23)
BUN: 30 mg/dL — ABNORMAL HIGH (ref 8–23)
CO2: 13 mmol/L — ABNORMAL LOW (ref 22–32)
CO2: 16 mmol/L — ABNORMAL LOW (ref 22–32)
CO2: 18 mmol/L — ABNORMAL LOW (ref 22–32)
CO2: 23 mmol/L (ref 22–32)
Calcium: 7.1 mg/dL — ABNORMAL LOW (ref 8.9–10.3)
Calcium: 7.6 mg/dL — ABNORMAL LOW (ref 8.9–10.3)
Calcium: 7.9 mg/dL — ABNORMAL LOW (ref 8.9–10.3)
Calcium: 8.4 mg/dL — ABNORMAL LOW (ref 8.9–10.3)
Chloride: 114 mmol/L — ABNORMAL HIGH (ref 98–111)
Chloride: 112 mmol/L — ABNORMAL HIGH (ref 98–111)
Chloride: 110 mmol/L (ref 98–111)
Chloride: 108 mmol/L (ref 98–111)
Creatinine, Ser: 2.01 mg/dL — ABNORMAL HIGH (ref 0.44–1.00)
Creatinine, Ser: 1.79 mg/dL — ABNORMAL HIGH (ref 0.44–1.00)
Creatinine, Ser: 1.62 mg/dL — ABNORMAL HIGH (ref 0.44–1.00)
Creatinine, Ser: 1.4 mg/dL — ABNORMAL HIGH (ref 0.44–1.00)
GFR calc Af Amer: 29 mL/min — ABNORMAL LOW (ref >60)
GFR calc Af Amer: 33 mL/min — ABNORMAL LOW (ref >60)
GFR calc Af Amer: 37 mL/min — ABNORMAL LOW (ref >60)
GFR calc Af Amer: 45 mL/min — ABNORMAL LOW (ref >60)
GFR calc non Af Amer: 25 mL/min — ABNORMAL LOW (ref >60)
GFR calc non Af Amer: 29 mL/min — ABNORMAL LOW (ref >60)
GFR calc non Af Amer: 32 mL/min — ABNORMAL LOW (ref >60)
GFR calc non Af Amer: 39 mL/min — ABNORMAL LOW (ref >60)
Glucose, Bld: 121 mg/dL — ABNORMAL HIGH (ref 70–99)
Glucose, Bld: 116 mg/dL — ABNORMAL HIGH (ref 70–99)
Glucose, Bld: 150 mg/dL — ABNORMAL HIGH (ref 70–99)
Glucose, Bld: 183 mg/dL — ABNORMAL HIGH (ref 70–99)
Potassium: 4.2 mmol/L (ref 3.5–5.1)
Potassium: 4.2 mmol/L (ref 3.5–5.1)
Potassium: 3.6 mmol/L (ref 3.5–5.1)
Potassium: 3.2 mmol/L — ABNORMAL LOW (ref 3.5–5.1)
Sodium: 141 mmol/L (ref 135–145)
Sodium: 142 mmol/L (ref 135–145)
Sodium: 142 mmol/L (ref 135–145)
Sodium: 143 mmol/L (ref 135–145)

## 2018-11-04 LAB — URINALYSIS, ROUTINE W REFLEX MICROSCOPIC
Bilirubin Urine: NEGATIVE
Glucose, UA: NEGATIVE mg/dL
Ketones, ur: 80 mg/dL — AB
Leukocytes,Ua: NEGATIVE
Nitrite: NEGATIVE
Protein, ur: 100 mg/dL — AB
Specific Gravity, Urine: 1.02 (ref 1.005–1.030)
pH: 5 (ref 5.0–8.0)

## 2018-11-04 LAB — POCT I-STAT 7, (LYTES, BLD GAS, ICA,H+H)
Acid-base deficit: 10 mmol/L — ABNORMAL HIGH (ref 0.0–2.0)
Bicarbonate: 14.4 mmol/L — ABNORMAL LOW (ref 20.0–28.0)
Calcium, Ion: 1.11 mmol/L — ABNORMAL LOW (ref 1.15–1.40)
HCT: 30 % — ABNORMAL LOW (ref 36.0–46.0)
Hemoglobin: 10.2 g/dL — ABNORMAL LOW (ref 12.0–15.0)
O2 Saturation: 95 %
Patient temperature: 98.6
Potassium: 4.2 mmol/L (ref 3.5–5.1)
Sodium: 143 mmol/L (ref 135–145)
TCO2: 15 mmol/L — ABNORMAL LOW (ref 22–32)
pCO2 arterial: 27.1 mmHg — ABNORMAL LOW (ref 32.0–48.0)
pH, Arterial: 7.334 — ABNORMAL LOW (ref 7.350–7.450)
pO2, Arterial: 79 mmHg — ABNORMAL LOW (ref 83.0–108.0)

## 2018-11-04 LAB — GLUCOSE, CAPILLARY
Glucose-Capillary: 119 mg/dL — ABNORMAL HIGH (ref 70–99)
Glucose-Capillary: 104 mg/dL — ABNORMAL HIGH (ref 70–99)
Glucose-Capillary: 113 mg/dL — ABNORMAL HIGH (ref 70–99)
Glucose-Capillary: 125 mg/dL — ABNORMAL HIGH (ref 70–99)
Glucose-Capillary: 144 mg/dL — ABNORMAL HIGH (ref 70–99)
Glucose-Capillary: 147 mg/dL — ABNORMAL HIGH (ref 70–99)
Glucose-Capillary: 160 mg/dL — ABNORMAL HIGH (ref 70–99)
Glucose-Capillary: 161 mg/dL — ABNORMAL HIGH (ref 70–99)
Glucose-Capillary: 155 mg/dL — ABNORMAL HIGH (ref 70–99)

## 2018-11-04 LAB — CBC WITH DIFFERENTIAL/PLATELET
Abs Immature Granulocytes: 0.12 10*3/uL — ABNORMAL HIGH (ref 0.00–0.07)
Basophils Absolute: 0.1 10*3/uL (ref 0.0–0.1)
Basophils Relative: 0 %
Eosinophils Absolute: 0.1 10*3/uL (ref 0.0–0.5)
Eosinophils Relative: 0 %
HCT: 25.4 % — ABNORMAL LOW (ref 36.0–46.0)
Hemoglobin: 8.3 g/dL — ABNORMAL LOW (ref 12.0–15.0)
Immature Granulocytes: 1 %
Lymphocytes Relative: 10 %
Lymphs Abs: 1.9 10*3/uL (ref 0.7–4.0)
MCH: 22.4 pg — ABNORMAL LOW (ref 26.0–34.0)
MCHC: 32.7 g/dL (ref 30.0–36.0)
MCV: 68.6 fL — ABNORMAL LOW (ref 80.0–100.0)
Monocytes Absolute: 0.6 10*3/uL (ref 0.1–1.0)
Monocytes Relative: 3 %
Neutro Abs: 17 10*3/uL — ABNORMAL HIGH (ref 1.7–7.7)
Neutrophils Relative %: 86 %
Platelets: 48 10*3/uL — ABNORMAL LOW (ref 150–400)
RBC: 3.7 MIL/uL — ABNORMAL LOW (ref 3.87–5.11)
RDW: 16.1 % — ABNORMAL HIGH (ref 11.5–15.5)
WBC: 19.8 10*3/uL — ABNORMAL HIGH (ref 4.0–10.5)
nRBC: 0.1 % (ref 0.0–0.2)

## 2018-11-04 LAB — CK TOTAL AND CKMB (NOT AT ARMC)
CK, MB: 6.3 ng/mL — ABNORMAL HIGH (ref 0.5–5.0)
Relative Index: 3 — ABNORMAL HIGH (ref 0.0–2.5)
Total CK: 211 U/L (ref 38–234)

## 2018-11-04 LAB — SODIUM, URINE, RANDOM: Sodium, Ur: 64 mmol/L

## 2018-11-04 LAB — PHOSPHORUS
Phosphorus: 2.2 mg/dL — ABNORMAL LOW (ref 2.5–4.6)
Phosphorus: 1.5 mg/dL — ABNORMAL LOW (ref 2.5–4.6)

## 2018-11-04 LAB — CREATININE, URINE, RANDOM: Creatinine, Urine: 97.49 mg/dL

## 2018-11-04 LAB — MAGNESIUM
Magnesium: 2.2 mg/dL (ref 1.7–2.4)
Magnesium: 2 mg/dL (ref 1.7–2.4)

## 2018-11-04 MED ORDER — VITAL AF 1.2 CAL PO LIQD
1000.0000 mL | ORAL | Status: DC
  Administered 2018-11-04 – 2018-11-05 (×2): 1000 mL

## 2018-11-04 MED ORDER — PRO-STAT SUGAR FREE PO LIQD: 30.0000 mL | Freq: Two times a day (BID) | ORAL | Status: DC

## 2018-11-04 MED ORDER — SODIUM BICARBONATE 8.4 % IV SOLN
50.0000 meq | Freq: Once | INTRAVENOUS | Status: AC
  Administered 2018-11-04: 09:00:00 50 meq via INTRAVENOUS

## 2018-11-04 MED ORDER — POTASSIUM CHLORIDE 20 MEQ/15ML (10%) PO SOLN
40.0000 meq | Freq: Once | ORAL | Status: AC
  Administered 2018-11-04: 40 meq
  Filled 2018-11-04: qty 30

## 2018-11-04 MED ORDER — INSULIN REGULAR(HUMAN) IN NACL 100-0.9 UT/100ML-% IV SOLN
INTRAVENOUS | Status: DC
  Administered 2018-11-04: 0.7 [IU]/h via INTRAVENOUS
  Administered 2018-11-05: 7.2 [IU]/h via INTRAVENOUS
  Administered 2018-11-06: 12 [IU]/h via INTRAVENOUS
  Administered 2018-11-07: 9.9 [IU]/h via INTRAVENOUS
  Filled 2018-11-04 (×3): qty 100

## 2018-11-04 MED ORDER — SODIUM BICARBONATE 8.4 % IV SOLN
INTRAVENOUS | Status: AC
  Administered 2018-11-04: 50 meq via INTRAVENOUS
  Filled 2018-11-04: qty 50

## 2018-11-04 MED ORDER — POTASSIUM CHLORIDE 20 MEQ/15ML (10%) PO SOLN
20.0000 meq | Freq: Once | ORAL | Status: AC
  Administered 2018-11-04: 20 meq via ORAL
  Filled 2018-11-04: qty 15

## 2018-11-04 MED ORDER — POTASSIUM CHLORIDE 20 MEQ/15ML (10%) PO SOLN
20.0000 meq | Freq: Once | ORAL | Status: AC
  Administered 2018-11-05: 07:00:00 20 meq via ORAL
  Filled 2018-11-04: qty 15

## 2018-11-04 MED ORDER — METOLAZONE 5 MG PO TABS
10.0000 mg | ORAL_TABLET | Freq: Once | ORAL | Status: AC
  Administered 2018-11-04: 11:00:00 10 mg
  Filled 2018-11-04: qty 2

## 2018-11-04 MED ORDER — VITAL HIGH PROTEIN PO LIQD: 1000.0000 mL | ORAL | Status: DC

## 2018-11-04 MED ORDER — VITAL AF 1.2 CAL PO LIQD: 1000.0000 mL | ORAL | Status: DC

## 2018-11-04 MED ORDER — DEXTROSE-NACL 5-0.45 % IV SOLN
INTRAVENOUS | Status: DC
  Administered 2018-11-04 – 2018-11-07 (×6): via INTRAVENOUS

## 2018-11-04 MED ORDER — FUROSEMIDE 10 MG/ML IJ SOLN
40.0000 mg | INTRAMUSCULAR | Status: AC
  Administered 2018-11-04 (×4): 40 mg via INTRAVENOUS
  Filled 2018-11-04 (×4): qty 4

## 2018-11-04 MED ORDER — SODIUM CHLORIDE 0.9 % IV SOLN: INTRAVENOUS | Status: DC

## 2018-11-04 NOTE — Progress Notes (Signed)
[facility] Progress Note Patient Name: Aimee Parker DOB: [birth year] MRN: [id number]   Date of Service  11/04/2018  HPI/Events of Note  K 3.2, creatinine 1.4 improving, Receiving furosemide  eICU Interventions  Ordered K 40 meqs x 1 per OGT     Intervention Category Major Interventions: Electrolyte abnormality - evaluation and management  Judd Lien 11/04/2018, 9:59 PM

## 2018-11-04 NOTE — Progress Notes (Signed)
Nutrition Follow-up  DOCUMENTATION CODES:   Not applicable  INTERVENTION:   Initiate tube feeds: - Vital AF 1.2 @ 55 ml/hr (1320 ml/day)  Tube feeding regimen provides 1584 kcal, 99 grams of protein, and 1071 ml of H2O.  NUTRITION DIAGNOSIS:   Inadequate oral intake related to inability to eat as evidenced by NPO status.  Ongoing, being addressed via initiation of TF  GOAL:   Patient will meet greater than or equal to 90% of their needs  Met via TF  MONITOR:   Vent status, Labs, Weight trends, TF tolerance, I & O's  REASON FOR ASSESSMENT:   Ventilator    ASSESSMENT:   68 year old female who presented to the ED on 10/20 with fever and weakness. PMH of HTN, T2DM. Pt found to have probable bilateral PNA and UTI and admitted with sepsis.  10/21 - intubated  RD consulted for TF initiation and management. Pt failed weaning this AM.  CCM ordered Adult ICU Tube Feeding Protocol. RD to adjust to better meet pt's needs.  Levophed off this AM.  OG tube remains in place.  Weight up 14 lbs since first measured weight. Suspect weight gain related to positive fluid balance. Pt with mild pitting edema to BUE and moderate pitting edema to BLE.  EDW: 68.2 kg (BMI 25.8)  Patient is currently intubated on ventilator support MV: 9.9 L/min Temp (24hrs), Avg:99.2 F (37.3 C), Min:97.9 F (36.6 C), Max:100.4 F (38 C)  Drips: Fentanyl: 10 ml/hr LR: 125 ml/hr  Medications reviewed and include: Lasix, SSI, Protonix, KCl 20 mEq once, IV abx  Labs reviewed: ionized calcium 1.11, BUN 39, creatinine 2.01 CBG's: 91-119 x 24 hours  UOP: 785 ml x 24 hours I/O's: +9.8 L since admit  Diet Order:   Diet Order            Diet NPO time specified  Diet effective now              EDUCATION NEEDS:   No education needs have been identified at this time  Skin:  Skin Assessment: Reviewed RN Assessment  Last BM:  11/02/18  Height:   Ht Readings from Last 1 Encounters:   11/01/18 _0  (1.626 m)    Weight:   Wt Readings from Last 1 Encounters:  11/04/18 74.5 kg    Ideal Body Weight:  54.5 kg  BMI:  Body mass index is 28.19 kg/m.  Estimated Nutritional Needs:   Kcal:  1595  Protein:  90-110 grams  Fluid:  >/= 1.6 L    Gaynell Face, MS, RD, LDN Inpatient Clinical Dietitian Pager: [phone] Weekend/After Hours: [phone]

## 2018-11-04 NOTE — Consult Note (Addendum)
NAME:  Aimee Parker, MRN:  [id number], DOB:  05/02/1950, LOS: 3 ADMISSION DATE:  11/01/2018, CONSULTATION DATE:  11/04/18 REFERRING MD: Dr. Marva Panda , CHIEF COMPLAINT:  sepsis   Brief History   68 y.o. F with PMH of DM and HTN who presents with 2-3 days of fever, malaise and fatigue.  She was found to have probable bilateral PNA and UTI in the ED with lactic acidosis and required Bipap, therefore PCCM was consulted.  Developed worsening respiratory distress with increased work of breathing and accessory muscle use requiring endotracheal intubation 11/02/2018     History of present illness   68 y.o. F who coming from home with PMH of DM and HTN who c/o fever, malaise, fatigue and some abdominal pain. She has been in her normal state of health until onset of symptoms and initially presented to urgent care, then to the ED and was initially febrile to 103F with tachycardia and eventually oxygen sats dropped in the 80%'s and pt was placed on Bipap.  In the ED, labs were significant for lactic acid of 4.3, WBC 17k, CO2 19 with gap of 15, creatinine 1.67 (baseline ~1.0) troponin 334 and UA significant for leuks and bacteria and +nitrite positive. She received 4L IVF, cefepime and vancomycin.  CT chest notable for bilateral basilar infiltrates and ground glass opacities. She was initially admitted to internal medicine and as BP was down-trending and she was requiring Bipap, PCCM was consulted.  On initial evaluation, pt is awake and responsive and denies any complaints.  Interviewed with translation assistance from family [facility] interpreter on the way).  She denies any chest pain, current abdominal pain, recent URI symptoms, diarrhea or other complaints.  No recent UTI's or hospitalizations.   Past Medical History  Vitamin D deficiency  Hypertension  Type 2 diabetes   Significant Hospital Events   10/20 Admit to [facility] 10/21 Worsening respiratory distress requiring intubation   Consults:     Procedures:  EET 10/21 >>  A-line 10/21 >> Left IJ CVC 10/21 >>  Significant Diagnostic Tests:  10/20 CT chest >> Patchy airspace areas of consolidation in the posterior lower lungs with ground-glass opacities in the upper lungs   10/20 CT abd/pelvis >> Heterogeneous left renal nephrogram with heterogenous striated appearance consistent with pyelonephritis, moderate left perinephritic edema and mild left ureteral prominence. Wall thickening of splenic flexure of colon likely due to renal inflammation   Micro Data:  10/20 Sars-CoV-2 >> neg 10/20 Blood culture >>  Enterobacteriaceae and Ecoli  10/20 UC  >> 10/21 RVP  >> neg 10/22 Sputum Culture >>  Antimicrobials:  Vancomycin 10/20 >> 10/21 Cefepime 10/20 >>   Interim history/subjective:  RN reports better vent compliance on pressure control ventilation yesterday and overnight. Continues to have minimal urine output with a net positive fluid balance of 3.4L in the last 24hrs   Objective   Blood pressure (!) 119/57, pulse 77, temperature 99.1 F (37.3 C), temperature source Core, resp. rate 16, height 5\' 4"  (1.626 m), weight 74.5 kg, SpO2 99 %.    Vent Mode: PSV;CPAP FiO2 (%):  [40 %-70 %] 40 % Set Rate:  [15 bmp-22 bmp] 16 bmp PEEP:  [5 cmH20-12 cmH20] 5 cmH20 Pressure Support:  [5 cmH20] 5 cmH20 Plateau Pressure:  [18 cmH20-25 cmH20] 19 cmH20   Intake/Output Summary (Last 24 hours) at 11/04/2018 0913 Last data filed at 11/04/2018 0700 Gross per 24 hour  Intake 3678.88 ml  Output 755 ml  Net 2923.88 ml  Filed Weights   11/02/18 0404 11/03/18 0500 11/04/18 0500  Weight: 68.2 kg 71.4 kg 74.5 kg   Physical Exam  General: Chronically ill appearing female on mechanical ventilation, in NAD HEENT: ETT, MM pink/moist, PERRL,  Neuro: opens eyes to verbal stimuli, moves all extremities purposefully  CV: s1s2 regular rate and rhythm, no murmur, rubs, or gallops,  PULM:  No increased work of breathing, mild rhonchi bilaterally  improved from day prior  GI: soft, bowel sounds hypoactive with abdominal distension seen Extremities: warm/dry, generalized no pitting edema  Skin: no rashes or lesions   Resolved Hospital Problem list     Assessment & Plan:   Acute hypoxic respiratory failure  -Seen with increased with of breathing with significant tachypnea and accessory muscle use prompting need for intubation  P:  Vent support PCV 8cc/kg Daily SBT trial, tolerating wean well this am  Possible extubation today  Wean PEEP and FiO2 for sats greater then 92% Continue IV antibiotics  VAP bundle in place  Follow cultures   Severe Sepsis due to E-Coli bacteremia  Left complicated pyelonephritis  -Secondary to UTI with PNA P: Blood cultures positive for Enterobacteriaceae and Ecoli  Continue IV antibiotics Follow intermittent CXR and ABG Follow cultures   Metabolic Acidosis -suspect this is secondary to renal injury and lactic acidosis -Beta-Hydroxybutyric elevated at 5.24 P: Trend BMP  Continue IV hydration  Repeat dosing of Bicarb   AKI -Creatinine 1.67, suspect this is pre-renal secondary to sepsis and insensible losses from fever. Baseline creatinine 0.70-0.90 P: Obtain urine lytes Renal ultrasound IV hydration  Monitor renal output Avoid nephrotoxins, ensure adequate renal perfusion   Thrombocytopenia  -Platelet count has consistently dropped during admission 179 > 111 > 57, likely secondary to sever sepsis and bacteremia  -Subq heparin discontinued 10/22 P: Close monitoring of platelet count  Hold on initiation of HIT panel for now   Elevated Troponin -Trop 161>[id number] -suspect secondary to demand ischemia from sepsis, no signs of ACS on initial EKG P: No EKG changes seen  ECHO with preserved EF Continuous telemetry   Hypokalemia/Hypomagnesemia  -Given 40meq K and Magnesium 2g Iv overnight P Trend Bmet  Supplement as needed   Best practice:  Diet: NPO will begin TF if not extubated  today  Pain/Anxiety/Delirium protocol (if indicated): Fentanyl drip  VAP protocol (if indicated): in place  DVT prophylaxis: heparin, SCD's GI prophylaxis: PPI Glucose control: SSI Mobility:Bedrest  Code Status: Full Family Communication: Mother update at bedside extensively 10/21  Disposition: ICU  Labs   CBC: Recent Labs  Lab 11/01/18 1535  11/02/18 0352  11/02/18 1759 11/03/18 0520 11/03/18 1205 11/04/18 0449 11/04/18 0841  WBC 17.0*  --  17.1*  --   --  13.6*  --  19.8*  --   NEUTROABS 15.5*  --   --   --   --   --   --  17.0*  --   HGB 11.0*   < > 8.9*   < > 8.5* 8.2* 8.8* 8.3* 10.2*  HCT 33.6*   < > 28.2*   < > 25.0* 25.3* 26.0* 25.4* 30.0*  MCV 69.3*  --  70.0*  --   --  68.8*  --  68.6*  --   PLT 179  --  111*  --   --  57*  --  48*  --    < > = values in this interval not displayed.    Basic Metabolic Panel: Recent Labs  Lab 11/01/18  1535  11/01/18 2200 11/01/18 2306 11/02/18 0352  11/02/18 1759 11/03/18 0520 11/03/18 1205 11/04/18 0449 11/04/18 0841  NA 135   < >  --  137 137   < > 140 140 142 141 143  K 3.4*   < >  --  3.4* 3.5   < > 4.0 3.9 4.1 4.2 4.2  CL 101  --   --  107 106  --   --  115*  --  114*  --   CO2 19*  --   --  17* 18*  --   --  12*  --  13*  --   GLUCOSE 196*  --   --  113* 155*  --   --  92  --  121*  --   BUN 29*  --   --  28* 31*  --   --  38*  --  39*  --   CREATININE 1.67*  --   --  1.63* 1.96*  --   --  1.99*  --  2.01*  --   CALCIUM 9.2  --   --  7.9* 8.1*  --   --  7.2*  --  7.1*  --   MG  --   --  1.2*  --  2.0  --   --   --   --   --   --    < > = values in this interval not displayed.   GFR: Estimated Creatinine Clearance: 26.5 mL/min (A) (by C-G formula based on SCr of 2.01 mg/dL (H)). Recent Labs  Lab 11/01/18 1535  11/02/18 0352 11/02/18 0620 11/02/18 1815 11/02/18 2158 11/03/18 0520 11/04/18 0449  WBC 17.0*  --  17.1*  --   --   --  13.6* 19.8*  LATICACIDVEN  --    < > 4.6* 5.2* 4.1* 3.7* 2.6*  --    < > =  values in this interval not displayed.    Liver Function Tests: Recent Labs  Lab 11/01/18 1535  AST 37  ALT 33  ALKPHOS 68  BILITOT 1.0  PROT 7.6  ALBUMIN 3.6   No results for input(s): LIPASE, AMYLASE in the last 168 hours. No results for input(s): AMMONIA in the last 168 hours.  ABG    Component Value Date/Time   PHART 7.334 (L) 11/04/2018 0841   PCO2ART 27.1 (L) 11/04/2018 0841   PO2ART 79.0 (L) 11/04/2018 0841   HCO3 14.4 (L) 11/04/2018 0841   TCO2 15 (L) 11/04/2018 0841   ACIDBASEDEF 10.0 (H) 11/04/2018 0841   O2SAT 95.0 11/04/2018 0841     Coagulation Profile: No results for input(s): INR, PROTIME in the last 168 hours.  Cardiac Enzymes: No results for input(s): CKTOTAL, CKMB, CKMBINDEX, TROPONINI in the last 168 hours.  HbA1C: Hemoglobin A1C  Date/Time Value Ref Range Status  08/30/2018 10:15 AM 7.7 (A) 4.0 - 5.6 % Final  05/20/2018 09:25 AM 9.4 (A) 4.0 - 5.6 % Final   HbA1c, POC (controlled diabetic range)  Date/Time Value Ref Range Status  01/17/2018 10:27 AM 8.3 (A) 0.0 - 7.0 % Corrected   Hgb A1c MFr Bld  Date/Time Value Ref Range Status  11/02/2018 03:52 AM 8.1 (H) 4.8 - 5.6 % Final    Comment:    (NOTE) Pre diabetes:          5.7%-6.4% Diabetes:              >6.4% Glycemic control for   <7.0%  adults with diabetes   05/23/2015 11:21 AM 9.6 (H) <5.7 % Final    Comment:      For someone without known diabetes, a hemoglobin A1c value of 6.5% or greater indicates that they may have diabetes and this should be confirmed with a follow-up test.   For someone with known diabetes, a value <7% indicates that their diabetes is well controlled and a value greater than or equal to 7% indicates suboptimal control. A1c targets should be individualized based on duration of diabetes, age, comorbid conditions, and other considerations.   Currently, no consensus exists for use of hemoglobin A1c for diagnosis of diabetes for children.       CBG:  Recent Labs  Lab 11/03/18 1542 11/03/18 2001 11/03/18 2343 11/04/18 0350 11/04/18 0813  GLUCAP 96 108* 107* 119* 104*    Critical care time: 45 minutes     CRITICAL CARE Performed by: Delfin Gant   Total critical care time: 35 minutes  Critical care time was exclusive of separately billable procedures and treating other patients.  Critical care was necessary to treat or prevent imminent or life-threatening deterioration.  Critical care was time spent personally by me on the following activities: development of treatment plan with patient and/or surrogate as well as nursing, discussions with consultants, evaluation of patient's response to treatment, examination of patient, obtaining history from patient or surrogate, ordering and performing treatments and interventions, ordering and review of laboratory studies, ordering and review of radiographic studies, pulse oximetry and re-evaluation of patient's condition.   Delfin Gant, NP-C [facility] Contact information can be found on Amion  After hours pager: [phone]. 11/04/2018, 9:13 AM  Attending Note:  68 year old female with PNA and UTI causing VDRF.  Patient's O2 has improved but on exam lungs are very coarse and needs active diureses.  I reviewed CXR myself, ETT is in a good position and pulmonary edema noted.  Discussed with PCCM-NP.  Will actively diurese patient today.  Failed weaning, will continue for vent support for now.  Continue abx as ordered.  F/U on cultures.  Family updated bedside.  PCCM will continue to manage.  The patient is critically ill with multiple organ systems failure and requires high complexity decision making for assessment and support, frequent evaluation and titration of therapies, application of advanced monitoring technologies and extensive interpretation of multiple databases.   Critical Care Time devoted to patient care services described in this note is  33   Minutes. This time reflects time of care of this signee Dr Koren Bound. This critical care time does not reflect procedure time, or teaching time or supervisory time of PA/NP/Med student/Med Resident etc but could involve care discussion time.  Alyson Reedy, M.D. [facility]Critical Care Medicine.

## 2018-11-05 DIAGNOSIS — R6521 Severe sepsis with septic shock: Secondary | ICD-10-CM | POA: Diagnosis not present

## 2018-11-05 DIAGNOSIS — A419 Sepsis, unspecified organism: Secondary | ICD-10-CM | POA: Diagnosis not present

## 2018-11-05 DIAGNOSIS — J9601 Acute respiratory failure with hypoxia: Secondary | ICD-10-CM | POA: Diagnosis not present

## 2018-11-05 DIAGNOSIS — J189 Pneumonia, unspecified organism: Secondary | ICD-10-CM | POA: Diagnosis not present

## 2018-11-05 LAB — CULTURE, BLOOD (ROUTINE X 2)

## 2018-11-05 LAB — BASIC METABOLIC PANEL
Anion gap: 12 (ref 5–15)
Anion gap: 11 (ref 5–15)
BUN: 29 mg/dL — ABNORMAL HIGH (ref 8–23)
BUN: 27 mg/dL — ABNORMAL HIGH (ref 8–23)
CO2: 25 mmol/L (ref 22–32)
CO2: 27 mmol/L (ref 22–32)
Calcium: 8.7 mg/dL — ABNORMAL LOW (ref 8.9–10.3)
Calcium: 8.6 mg/dL — ABNORMAL LOW (ref 8.9–10.3)
Chloride: 106 mmol/L (ref 98–111)
Chloride: 107 mmol/L (ref 98–111)
Creatinine, Ser: 1.28 mg/dL — ABNORMAL HIGH (ref 0.44–1.00)
Creatinine, Ser: 1.16 mg/dL — ABNORMAL HIGH (ref 0.44–1.00)
GFR calc Af Amer: 50 mL/min — ABNORMAL LOW (ref >60)
GFR calc Af Amer: 56 mL/min — ABNORMAL LOW (ref >60)
GFR calc non Af Amer: 43 mL/min — ABNORMAL LOW (ref >60)
GFR calc non Af Amer: 48 mL/min — ABNORMAL LOW (ref >60)
Glucose, Bld: 195 mg/dL — ABNORMAL HIGH (ref 70–99)
Glucose, Bld: 163 mg/dL — ABNORMAL HIGH (ref 70–99)
Potassium: 3.5 mmol/L (ref 3.5–5.1)
Potassium: 3.2 mmol/L — ABNORMAL LOW (ref 3.5–5.1)
Sodium: 143 mmol/L (ref 135–145)
Sodium: 145 mmol/L (ref 135–145)

## 2018-11-05 LAB — CBC
HCT: 26 % — ABNORMAL LOW (ref 36.0–46.0)
Hemoglobin: 8.8 g/dL — ABNORMAL LOW (ref 12.0–15.0)
MCH: 22.1 pg — ABNORMAL LOW (ref 26.0–34.0)
MCHC: 33.8 g/dL (ref 30.0–36.0)
MCV: 65.2 fL — ABNORMAL LOW (ref 80.0–100.0)
Platelets: 58 10*3/uL — ABNORMAL LOW (ref 150–400)
RBC: 3.99 MIL/uL (ref 3.87–5.11)
RDW: 14.8 % (ref 11.5–15.5)
WBC: 12.8 10*3/uL — ABNORMAL HIGH (ref 4.0–10.5)
nRBC: 0.3 % — ABNORMAL HIGH (ref 0.0–0.2)

## 2018-11-05 LAB — PHOSPHORUS
Phosphorus: 1.6 mg/dL — ABNORMAL LOW (ref 2.5–4.6)
Phosphorus: 2.8 mg/dL (ref 2.5–4.6)

## 2018-11-05 LAB — MAGNESIUM
Magnesium: 1.6 mg/dL — ABNORMAL LOW (ref 1.7–2.4)
Magnesium: 1.4 mg/dL — ABNORMAL LOW (ref 1.7–2.4)

## 2018-11-05 LAB — GLUCOSE, CAPILLARY
Glucose-Capillary: 150 mg/dL — ABNORMAL HIGH (ref 70–99)
Glucose-Capillary: 151 mg/dL — ABNORMAL HIGH (ref 70–99)
Glucose-Capillary: 167 mg/dL — ABNORMAL HIGH (ref 70–99)
Glucose-Capillary: 169 mg/dL — ABNORMAL HIGH (ref 70–99)
Glucose-Capillary: 171 mg/dL — ABNORMAL HIGH (ref 70–99)
Glucose-Capillary: 184 mg/dL — ABNORMAL HIGH (ref 70–99)
Glucose-Capillary: 187 mg/dL — ABNORMAL HIGH (ref 70–99)
Glucose-Capillary: 188 mg/dL — ABNORMAL HIGH (ref 70–99)
Glucose-Capillary: 130 mg/dL — ABNORMAL HIGH (ref 70–99)
Glucose-Capillary: 155 mg/dL — ABNORMAL HIGH (ref 70–99)
Glucose-Capillary: 130 mg/dL — ABNORMAL HIGH (ref 70–99)
Glucose-Capillary: 152 mg/dL — ABNORMAL HIGH (ref 70–99)
Glucose-Capillary: 159 mg/dL — ABNORMAL HIGH (ref 70–99)
Glucose-Capillary: 159 mg/dL — ABNORMAL HIGH (ref 70–99)
Glucose-Capillary: 162 mg/dL — ABNORMAL HIGH (ref 70–99)
Glucose-Capillary: 168 mg/dL — ABNORMAL HIGH (ref 70–99)
Glucose-Capillary: 171 mg/dL — ABNORMAL HIGH (ref 70–99)
Glucose-Capillary: 187 mg/dL — ABNORMAL HIGH (ref 70–99)
Glucose-Capillary: 197 mg/dL — ABNORMAL HIGH (ref 70–99)
Glucose-Capillary: 241 mg/dL — ABNORMAL HIGH (ref 70–99)
Glucose-Capillary: 181 mg/dL — ABNORMAL HIGH (ref 70–99)
Glucose-Capillary: 153 mg/dL — ABNORMAL HIGH (ref 70–99)
Glucose-Capillary: 112 mg/dL — ABNORMAL HIGH (ref 70–99)
Glucose-Capillary: 117 mg/dL — ABNORMAL HIGH (ref 70–99)

## 2018-11-05 MED ORDER — POTASSIUM PHOSPHATES 15 MMOLE/5ML IV SOLN
20.0000 mmol | Freq: Once | INTRAVENOUS | Status: AC
  Administered 2018-11-05: 20 mmol via INTRAVENOUS
  Filled 2018-11-05: qty 6.67

## 2018-11-05 MED ORDER — VITAL HIGH PROTEIN PO LIQD: 1000.0000 mL | ORAL | Status: DC

## 2018-11-05 MED ORDER — POTASSIUM CHLORIDE 20 MEQ/15ML (10%) PO SOLN
40.0000 meq | Freq: Once | ORAL | Status: AC
  Administered 2018-11-05: 16:00:00 40 meq
  Filled 2018-11-05: qty 30

## 2018-11-05 MED ORDER — PRO-STAT SUGAR FREE PO LIQD
30.0000 mL | Freq: Two times a day (BID) | ORAL | Status: DC
  Administered 2018-11-05 – 2018-11-09 (×9): 30 mL
  Filled 2018-11-05 (×10): qty 30

## 2018-11-05 NOTE — Plan of Care (Signed)
  Problem: Nutrition: Goal: Adequate nutrition will be maintained Outcome: Progressing Note: Although tolerating TF well, they are not yet at goal. RN will speak with dietary about changing order to meet goal.   Problem: Elimination: Goal: Will not experience complications related to urinary retention Outcome: Progressing   Problem: Pain Managment: Goal: General experience of comfort will improve Outcome: Progressing   Problem: Skin Integrity: Goal: Risk for impaired skin integrity will decrease Outcome: Progressing   Problem: Activity: Goal: Risk for activity intolerance will decrease Outcome: Not Progressing   Problem: Elimination: Goal: Will not experience complications related to bowel motility Outcome: Not Progressing Note: No BM since 10/21

## 2018-11-05 NOTE — Progress Notes (Signed)
Placed patient back on full support due to increased RR to 45, increased BP 170/95, and patient appeared agitated.

## 2018-11-05 NOTE — Progress Notes (Signed)
RN addressed insulin gtt with MD and said pt was not put on it for DKA per night shift nurse, but for increased Ketones in urine. RN wanted to know if patient still needed to be on insulin drip. MD said to leave patient on for today. Will continue to monitor.

## 2018-11-05 NOTE — Progress Notes (Signed)
NAME:  Aimee Parker, MRN:  [id number], DOB:  1950-10-28, LOS: 4 ADMISSION DATE:  11/01/2018, CONSULTATION DATE: 11/04/2018 REFERRING MD: Dr. Marva Panda, CHIEF COMPLAINT: Sepsis  Brief History   68 y.o. F with PMH of DM and HTN who presents with 2-3 days of fever, malaise and fatigue.  She was found to have probable bilateral PNA and UTI in the ED with lactic acidosis and required Bipap, therefore PCCM was consulted.  Developed worsening respiratory distress with increased work of breathing and accessory muscle use requiring endotracheal intubation 11/02/2018     History of present illness   68 y.o. F who coming from home with PMH of DM and HTN who c/o fever, malaise, fatigue and some abdominal pain. She has been in her normal state of health until onset of symptoms and initially presented to urgent care, then to the ED and was initially febrile to 103F with tachycardia and eventually oxygen sats dropped in the 80%'s and pt was placed on Bipap.  In the ED, labs were significant for lactic acid of 4.3, WBC 17k, CO2 19 with gap of 15, creatinine 1.67 (baseline ~1.0) troponin 334 and UA significant for leuks and bacteria and +nitrite positive. She received 4L IVF, cefepime and vancomycin.  CT chest notable for bilateral basilar infiltrates and ground glass opacities. She was initially admitted to internal medicine and as BP was down-trending and she was requiring Bipap, PCCM was consulted.  On initial evaluation, pt is awake and responsive and denies any complaints.  Interviewed with translation assistance from family [facility] interpreter on the way).  She denies any chest pain, current abdominal pain, recent URI symptoms, diarrhea or other complaints.  No recent UTI's or hospitalizations.   Past Medical History  Vitamin D deficiency Hypertension Type 2 diabetes  Significant Hospital Events   10/20 Admit to [facility] 10/21 Worsening respiratory distress requiring intubation  Consults:    Procedures:   EET 10/21 >>  A-line 10/21 >> Left IJ CVC 10/21 >>   Significant Diagnostic Tests:  10/20 CT chest >> Patchy airspace areas of consolidation in the posterior lower lungs with ground-glass opacities in the upper lungs   10/20 CT abd/pelvis >> Heterogeneous left renal nephrogram with heterogenous striated appearance consistent with pyelonephritis, moderate left perinephritic edema and mild left ureteral prominence. Wall thickening of splenic flexure of colon likely due to renal inflammation   Micro Data:  10/20 Sars-CoV-2 >> neg 10/20 Blood culture >>  Enterobacteriaceae and Ecoli  10/20 UC  >> 10/21 RVP  >> neg 10/22 Sputum Culture >>  Antimicrobials:  Vancomycin 10/20 >> 10/21 Cefepime 10/20 >>   Interim history/subjective:  Patient does open eyes, does not follow commands  Objective   Blood pressure 107/61, pulse 83, temperature 99.9 F (37.7 C), resp. rate 18, height 5\' 4"  (1.626 m), weight 66.7 kg, SpO2 100 %.    Vent Mode: PCV FiO2 (%):  [40 %] 40 % Set Rate:  [18 bmp] 18 bmp Vt Set:  [430 mL] 430 mL PEEP:  [8 cmH20] 8 cmH20 Plateau Pressure:  [14 cmH20-17 cmH20] 17 cmH20   Intake/Output Summary (Last 24 hours) at 11/05/2018 1512 Last data filed at 11/05/2018 1400 Gross per 24 hour  Intake 2498.03 ml  Output 6720 ml  Net -4221.97 ml   Filed Weights   11/03/18 0500 11/04/18 0500 11/05/18 0500  Weight: 71.4 kg 74.5 kg 66.7 kg    Examination: General: Chronically ill-appearing on mechanical ventilation HENT: Moist oral mucosa Lungs: Mild rhonchi Cardiovascular:  S1-S2 appreciated Abdomen: Soft, nontender, bowel sounds appreciated Extremities: Warm/dry, no pedal edema Neuro: Not following commands, opens eyes to verbal stimuli GU:   Resolved Hospital Problem list    Assessment & Plan:  Acute hypoxic respiratory failure -Continue ventilator support -Daily SBT -Wean FiO2 -VAP bundle -Follow cultures  Sepsis secondary to E. coli bacteremia Left  complicated pyelonephritis -Continue IV antibiotics -Follow cultures  Metabolic acidosis -Trend BMP -Continue IV hydration -Continue bicarb  Acute kidney injury -Continue IV hydration -Avoid nephrotoxic's  Thrombocytopenia -Subcu heparin discontinued -Monitor platelets -This is likely secondary to sepsis and bacteremia  Elevated troponin -Secondary to demand ischemia  Hypokalemia/hypomagnesemia -Trend BMET -Supplement as needed  Best practice:  Diet: Tube feed Pain/Anxiety/Delirium protocol (if indicated): Fentanyl drip VAP protocol (if indicated): In place DVT prophylaxis: SCDs, heparin GI prophylaxis: PPI Glucose control: SSI Mobility: Bedrest Code Status: Full code Family Communication: Will update Disposition: ICU  Labs   CBC: Recent Labs  Lab 11/01/18 1535  11/02/18 0352  11/03/18 0520 11/03/18 1205 11/04/18 0449 11/04/18 0841 11/05/18 0459  WBC 17.0*  --  17.1*  --  13.6*  --  19.8*  --  12.8*  NEUTROABS 15.5*  --   --   --   --   --  17.0*  --   --   HGB 11.0*   < > 8.9*   < > 8.2* 8.8* 8.3* 10.2* 8.8*  HCT 33.6*   < > 28.2*   < > 25.3* 26.0* 25.4* 30.0* 26.0*  MCV 69.3*  --  70.0*  --  68.8*  --  68.6*  --  65.2*  PLT 179  --  111*  --  57*  --  48*  --  58*   < > = values in this interval not displayed.    Basic Metabolic Panel: Recent Labs  Lab 11/01/18 2200  11/02/18 0352  11/04/18 1130 11/04/18 1200 11/04/18 1538 11/04/18 1720 11/04/18 2006 11/05/18 0114 11/05/18 0459  NA  --    < > 137   < >  --  142 142  --  143 143 145  K  --    < > 3.5   < >  --  4.2 3.6  --  3.2* 3.5 3.2*  CL  --    < > 106   < >  --  112* 110  --  108 106 107  CO2  --    < > 18*   < >  --  16* 18*  --  23 25 27   GLUCOSE  --    < > 155*   < >  --  116* 150*  --  183* 195* 163*  BUN  --    < > 31*   < >  --  34* 32*  --  30* 29* 27*  CREATININE  --    < > 1.96*   < >  --  1.79* 1.62*  --  1.40* 1.28* 1.16*  CALCIUM  --    < > 8.1*   < >  --  7.6* 7.9*  --   8.4* 8.7* 8.6*  MG 1.2*  --  2.0  --  2.2  --   --  2.0  --   --  1.6*  PHOS  --   --   --   --  2.2*  --   --  1.5*  --   --  1.6*   < > = values in  this interval not displayed.   GFR: Estimated Creatinine Clearance: 43.6 mL/min (A) (by C-G formula based on SCr of 1.16 mg/dL (H)). Recent Labs  Lab 11/02/18 0352 11/02/18 0620 11/02/18 1815 11/02/18 2158 11/03/18 0520 11/04/18 0449 11/05/18 0459  WBC 17.1*  --   --   --  13.6* 19.8* 12.8*  LATICACIDVEN 4.6* 5.2* 4.1* 3.7* 2.6*  --   --     Liver Function Tests: Recent Labs  Lab 11/01/18 1535  AST 37  ALT 33  ALKPHOS 68  BILITOT 1.0  PROT 7.6  ALBUMIN 3.6   No results for input(s): LIPASE, AMYLASE in the last 168 hours. No results for input(s): AMMONIA in the last 168 hours.  ABG    Component Value Date/Time   PHART 7.334 (L) 11/04/2018 0841   PCO2ART 27.1 (L) 11/04/2018 0841   PO2ART 79.0 (L) 11/04/2018 0841   HCO3 14.4 (L) 11/04/2018 0841   TCO2 15 (L) 11/04/2018 0841   ACIDBASEDEF 10.0 (H) 11/04/2018 0841   O2SAT 95.0 11/04/2018 0841     Coagulation Profile: No results for input(s): INR, PROTIME in the last 168 hours.  Cardiac Enzymes: Recent Labs  Lab 11/04/18 1130  CKTOTAL 211  CKMB 6.3*    HbA1C: Hemoglobin A1C  Date/Time Value Ref Range Status  08/30/2018 10:15 AM 7.7 (A) 4.0 - 5.6 % Final  05/20/2018 09:25 AM 9.4 (A) 4.0 - 5.6 % Final   HbA1c, POC (controlled diabetic range)  Date/Time Value Ref Range Status  01/17/2018 10:27 AM 8.3 (A) 0.0 - 7.0 % Corrected   Hgb A1c MFr Bld  Date/Time Value Ref Range Status  11/02/2018 03:52 AM 8.1 (H) 4.8 - 5.6 % Final    Comment:    (NOTE) Pre diabetes:          5.7%-6.4% Diabetes:              >6.4% Glycemic control for   <7.0% adults with diabetes   05/23/2015 11:21 AM 9.6 (H) <5.7 % Final    Comment:      For someone without known diabetes, a hemoglobin A1c value of 6.5% or greater indicates that they may have diabetes and this should be  confirmed with a follow-up test.   For someone with known diabetes, a value <7% indicates that their diabetes is well controlled and a value greater than or equal to 7% indicates suboptimal control. A1c targets should be individualized based on duration of diabetes, age, comorbid conditions, and other considerations.   Currently, no consensus exists for use of hemoglobin A1c for diagnosis of diabetes for children.       CBG: Recent Labs  Lab 11/05/18 0216 11/05/18 0331 11/05/18 0431 11/05/18 0544 11/05/18 0646  GLUCAP 187* 150* 151* 130* 155*   The patient is critically ill with multiple organ systems failure and requires high complexity decision making for assessment and support, frequent evaluation and titration of therapies, application of advanced monitoring technologies and extensive interpretation of multiple databases. Critical Care Time devoted to patient care services described in this note independent of APP/resident time (if applicable)  is 35 minutes.   Virl DiamondAdewale Latanja Lehenbauer MD [facility] Personal pager: [phone] If unanswered, please page CCM On-call: #[phone]

## 2018-11-06 ENCOUNTER — Inpatient Hospital Stay (HOSPITAL_COMMUNITY): Payer: Medicare Other

## 2018-11-06 DIAGNOSIS — Z978 Presence of other specified devices: Secondary | ICD-10-CM | POA: Diagnosis not present

## 2018-11-06 DIAGNOSIS — J9601 Acute respiratory failure with hypoxia: Secondary | ICD-10-CM | POA: Diagnosis not present

## 2018-11-06 DIAGNOSIS — G0490 Encephalitis and encephalomyelitis, unspecified: Secondary | ICD-10-CM

## 2018-11-06 DIAGNOSIS — J189 Pneumonia, unspecified organism: Secondary | ICD-10-CM | POA: Diagnosis not present

## 2018-11-06 DIAGNOSIS — A419 Sepsis, unspecified organism: Secondary | ICD-10-CM | POA: Diagnosis not present

## 2018-11-06 LAB — CBC WITH DIFFERENTIAL/PLATELET
Abs Immature Granulocytes: 0.34 10*3/uL — ABNORMAL HIGH (ref 0.00–0.07)
Basophils Absolute: 0 10*3/uL (ref 0.0–0.1)
Basophils Relative: 0 %
Eosinophils Absolute: 0.3 10*3/uL (ref 0.0–0.5)
Eosinophils Relative: 3 %
HCT: 25.2 % — ABNORMAL LOW (ref 36.0–46.0)
Hemoglobin: 8.4 g/dL — ABNORMAL LOW (ref 12.0–15.0)
Immature Granulocytes: 3 %
Lymphocytes Relative: 23 %
Lymphs Abs: 2.8 10*3/uL (ref 0.7–4.0)
MCH: 21.8 pg — ABNORMAL LOW (ref 26.0–34.0)
MCHC: 33.3 g/dL (ref 30.0–36.0)
MCV: 65.5 fL — ABNORMAL LOW (ref 80.0–100.0)
Monocytes Absolute: 1.1 10*3/uL — ABNORMAL HIGH (ref 0.1–1.0)
Monocytes Relative: 9 %
Neutro Abs: 7.6 10*3/uL (ref 1.7–7.7)
Neutrophils Relative %: 62 %
Platelets: 74 10*3/uL — ABNORMAL LOW (ref 150–400)
RBC: 3.85 MIL/uL — ABNORMAL LOW (ref 3.87–5.11)
RDW: 14.6 % (ref 11.5–15.5)
WBC: 12.2 10*3/uL — ABNORMAL HIGH (ref 4.0–10.5)
nRBC: 0.2 % (ref 0.0–0.2)

## 2018-11-06 LAB — BASIC METABOLIC PANEL
Anion gap: 10 (ref 5–15)
BUN: 17 mg/dL (ref 8–23)
CO2: 28 mmol/L (ref 22–32)
Calcium: 8 mg/dL — ABNORMAL LOW (ref 8.9–10.3)
Chloride: 102 mmol/L (ref 98–111)
Creatinine, Ser: 0.79 mg/dL (ref 0.44–1.00)
GFR calc Af Amer: >60 mL/min (ref >60)
GFR calc non Af Amer: >60 mL/min (ref >60)
Glucose, Bld: 185 mg/dL — ABNORMAL HIGH (ref 70–99)
Potassium: 3.3 mmol/L — ABNORMAL LOW (ref 3.5–5.1)
Sodium: 140 mmol/L (ref 135–145)

## 2018-11-06 LAB — GLUCOSE, CAPILLARY
Glucose-Capillary: 125 mg/dL — ABNORMAL HIGH (ref 70–99)
Glucose-Capillary: 133 mg/dL — ABNORMAL HIGH (ref 70–99)
Glucose-Capillary: 157 mg/dL — ABNORMAL HIGH (ref 70–99)
Glucose-Capillary: 175 mg/dL — ABNORMAL HIGH (ref 70–99)
Glucose-Capillary: 174 mg/dL — ABNORMAL HIGH (ref 70–99)
Glucose-Capillary: 181 mg/dL — ABNORMAL HIGH (ref 70–99)
Glucose-Capillary: 163 mg/dL — ABNORMAL HIGH (ref 70–99)
Glucose-Capillary: 169 mg/dL — ABNORMAL HIGH (ref 70–99)
Glucose-Capillary: 173 mg/dL — ABNORMAL HIGH (ref 70–99)
Glucose-Capillary: 164 mg/dL — ABNORMAL HIGH (ref 70–99)
Glucose-Capillary: 143 mg/dL — ABNORMAL HIGH (ref 70–99)
Glucose-Capillary: 149 mg/dL — ABNORMAL HIGH (ref 70–99)
Glucose-Capillary: 212 mg/dL — ABNORMAL HIGH (ref 70–99)
Glucose-Capillary: 214 mg/dL — ABNORMAL HIGH (ref 70–99)
Glucose-Capillary: 200 mg/dL — ABNORMAL HIGH (ref 70–99)
Glucose-Capillary: 216 mg/dL — ABNORMAL HIGH (ref 70–99)
Glucose-Capillary: 207 mg/dL — ABNORMAL HIGH (ref 70–99)
Glucose-Capillary: 214 mg/dL — ABNORMAL HIGH (ref 70–99)
Glucose-Capillary: 209 mg/dL — ABNORMAL HIGH (ref 70–99)

## 2018-11-06 LAB — MAGNESIUM
Magnesium: 1.4 mg/dL — ABNORMAL LOW (ref 1.7–2.4)
Magnesium: 2.3 mg/dL (ref 1.7–2.4)

## 2018-11-06 LAB — AMMONIA: Ammonia: 19 umol/L (ref 9–35)

## 2018-11-06 MED ORDER — POTASSIUM CHLORIDE 20 MEQ/15ML (10%) PO SOLN
20.0000 meq | ORAL | Status: AC
  Administered 2018-11-06 (×2): 20 meq
  Filled 2018-11-06 (×2): qty 15

## 2018-11-06 MED ORDER — FENTANYL CITRATE (PF) 100 MCG/2ML IJ SOLN
INTRAMUSCULAR | Status: AC
  Administered 2018-11-06: 16:00:00 200 ug via INTRAVENOUS
  Filled 2018-11-06: qty 4

## 2018-11-06 MED ORDER — VANCOMYCIN HCL 10 G IV SOLR
1500.0000 mg | Freq: Once | INTRAVENOUS | Status: AC
  Administered 2018-11-06: 1500 mg via INTRAVENOUS
  Filled 2018-11-06: qty 1500

## 2018-11-06 MED ORDER — DEXTROSE 5 % IV SOLN
700.0000 mg | Freq: Three times a day (TID) | INTRAVENOUS | Status: DC
  Administered 2018-11-06 – 2018-11-07 (×3): 700 mg via INTRAVENOUS
  Filled 2018-11-06 (×4): qty 14

## 2018-11-06 MED ORDER — SODIUM CHLORIDE 0.9 % IV SOLN
2.0000 g | Freq: Two times a day (BID) | INTRAVENOUS | Status: DC
  Administered 2018-11-07 – 2018-11-10 (×7): 2 g via INTRAVENOUS
  Filled 2018-11-06 (×7): qty 20

## 2018-11-06 MED ORDER — VITAL AF 1.2 CAL PO LIQD
1000.0000 mL | ORAL | Status: DC
  Administered 2018-11-06 – 2018-11-10 (×4): 1000 mL

## 2018-11-06 MED ORDER — SODIUM CHLORIDE 0.9 % IV SOLN
2.0000 g | INTRAVENOUS | Status: DC
  Administered 2018-11-06 – 2018-11-07 (×4): 2 g via INTRAVENOUS
  Filled 2018-11-06 (×3): qty 2
  Filled 2018-11-06 (×4): qty 2000

## 2018-11-06 MED ORDER — FENTANYL CITRATE (PF) 100 MCG/2ML IJ SOLN
200.0000 ug | Freq: Once | INTRAMUSCULAR | Status: AC
  Administered 2018-11-06: 16:00:00 200 ug via INTRAVENOUS

## 2018-11-06 MED ORDER — LABETALOL HCL 5 MG/ML IV SOLN
10.0000 mg | INTRAVENOUS | Status: DC | PRN
  Administered 2018-11-06 – 2018-11-07 (×2): 10 mg via INTRAVENOUS
  Filled 2018-11-06 (×2): qty 4

## 2018-11-06 MED ORDER — SODIUM CHLORIDE 0.9 % IV SOLN
6.0000 g | Freq: Once | INTRAVENOUS | Status: AC
  Administered 2018-11-06: 08:00:00 6 g via INTRAVENOUS
  Filled 2018-11-06: qty 12

## 2018-11-06 MED ORDER — GADOBUTROL 1 MMOL/ML IV SOLN
7.0000 mL | Freq: Once | INTRAVENOUS | Status: AC | PRN
  Administered 2018-11-06: 7 mL via INTRAVENOUS

## 2018-11-06 MED ORDER — VANCOMYCIN HCL IN DEXTROSE 750-5 MG/150ML-% IV SOLN
750.0000 mg | Freq: Two times a day (BID) | INTRAVENOUS | Status: DC
  Administered 2018-11-07: 750 mg via INTRAVENOUS
  Filled 2018-11-06 (×2): qty 150

## 2018-11-06 NOTE — Progress Notes (Signed)
Received pt in MRI from charge RT, pt ready to transport to ICU. Transported to ICU w/ no apparent complications.

## 2018-11-06 NOTE — Consult Note (Addendum)
NEURO HOSPITALIST CONSULT NOTE   Requesting physician: Dr. Nelda Marseille  Reason for Consult: Encephalopathy  History obtained from:  Patient daughter  Using interpreter  [phone] vietnamese)/Chart   HPI:                                                                                                                                          Aimee Parker is an 68 y.o. female  With PMH DM , HTN who presented to hospital with c/o of 2-3  day history of fever, fatigue and some abdominal pain.  Per daughter she has not been able to follow commands or recognize her, and patient did not c/o any illness prior to fevers.  Hospital course: 10/20: admitted;  Lactic acidosis required bipap; sepsis protocol initiated 10/21: intubated for respiratory distress. 10/25 neurology consulted,  CTH:10 x 15 mm hypodensity left posterior cerebellum.  MRI: pending   Past Medical History:  Diagnosis Date  . Diabetes mellitus without complication ([place])   . Hypertension   . Vitamin D deficiency     Past Surgical History:  Procedure Laterality Date  . EYE SURGERY     Left eye surgery  . PARS PLANA VITRECTOMY Right 05/13/2017   Procedure: PARS PLANA VITRECTOMY 25 GAUGE FOR ENDOPHTHALMITIS;  Surgeon: Hurman Horn, MD;  Location: [facility];  Service: Ophthalmology;  Laterality: Right;    Family History  Problem Relation Age of Onset  . Hypertension Mother   . Hypertension Father          Social History:  reports that she has never smoked. She has never used smokeless tobacco. She reports that she does not drink alcohol or use drugs.  No Known Allergies  MEDICATIONS:                                                                                                                     Scheduled: . chlorhexidine gluconate (MEDLINE KIT)  15 mL Mouth Rinse BID  . Chlorhexidine Gluconate Cloth  6 each Topical Daily  . feeding supplement (PRO-STAT SUGAR FREE 64)  30 mL Per Tube BID  . mouth  rinse  15 mL Mouth Rinse 10 times per day  . pantoprazole (PROTONIX) IV  40 mg Intravenous Daily  . sodium chloride flush  3 mL Intravenous Q12H   Continuous: . sodium chloride Stopped (11/06/18 0751)  . sodium chloride Stopped (11/04/18 1240)  . cefTRIAXone (ROCEPHIN)  IV Stopped (11/05/18 1833)  . dextrose 5 % and 0.45% NaCl 75 mL/hr at 11/06/18 1000  . feeding supplement (VITAL AF 1.2 CAL) 25 mL/hr at 11/06/18 1000  . fentaNYL infusion INTRAVENOUS 125 mcg/hr (11/06/18 1000)  . insulin 2.3 Units/hr (11/06/18 1030)  . magnesium sulfate LVP 250-500 ml 43.7 mL/hr at 11/06/18 1000   GTX:MIWOEHOZYYQMG, acetaminophen, bisacodyl, docusate, fentaNYL (SUBLIMAZE) injection, fentaNYL (SUBLIMAZE) injection, labetalol, ondansetron (ZOFRAN) IV   ROS:                                                                                                                                        unobtainable from patient due to mental status and intubation   Blood pressure 130/67, pulse 85, temperature 99.7 F (37.6 C), temperature source Core, resp. rate 18, height _0  (1.626 m), weight 70.2 kg, SpO2 97 %.   General Examination:                                                                                                       Physical Exam  HEENT-  Normocephalic, no lesions, without obvious abnormality.  Normal external eye and conjunctiva.  Neck is stiff in flexion but not rotation.  Cardiovascular-, pulses palpable throughout   Lungs- Intubated Extremities- Developing SQ edema Musculoskeletal-no joint tenderness, deformity or swelling Skin-warm and dry, no hyperpigmentation, vitiligo, or suspicious lesions  Neurological Examination exam performed using [place] interpreter and daughter. Patient receiving fentanyl. Mental Status: Patient intubated, cough and gag reflex intact,  aroused to light touch and name calling. Patient was able to wiggle her toes on BLE. Able to squeeze hands bilaterally  and wiggle fingers. Unable to break gravity with any extremity, and did not appear to respond to noxious stimuli in either extremity.  According to family and nursing this is an improvement as she was unable to follow any commands previously.   CN: Patient does not track objects, but is able to cross midline with eyes. No  Gaze deviation noted. Appears to stare off behind examiner and daughter. Does not focus on objects. Follow up attending exam: Blinks to threat bilateral temporal visual fields. Slowly moves eyes in the direction examiner is standing in as he walks from her left to her right, but does not fixate on examiner, appearing to use peripheral vision for tracking.  PERRL  sluggish. Blinks to eyelid stimulation bilaterally. Face flaccidly symmetric. Intubated.   Motor: BUE able to grip hands with good strength 4/5 and wiggle fingers. Unable to break gravity. BLE able to wiggle toes, but unable to break gravity.  On follow up attending exam, she slowly withdraws BLE to sustained noxious plantar stimulation, right weaker than left, does not lift against gravity. Per RN, during MRI patient became agitated and began vigorously moving BUE and BLE, slamming limbs against the guardrail of the bed and able to lift antigravity.  Sensory: able to localize and will scrunch toes on BLE to noxious. Plantars: Right: Briskly downgoing  Left: Briskly upgoing Cerebellar/Gait: Unable to perform    Lab Results: Basic Metabolic Panel: Recent Labs  Lab 11/04/18 1130  11/04/18 1538 11/04/18 1720 11/04/18 2006 11/05/18 0114 11/05/18 0459 11/05/18 1725 11/06/18 0432  NA  --    < > 142  --  143 143 145  --  140  K  --    < > 3.6  --  3.2* 3.5 3.2*  --  3.3*  CL  --    < > 110  --  108 106 107  --  102  CO2  --    < > 18*  --  _0 --  28  GLUCOSE  --    < > 150*  --  183* 195* 163*  --  185*  BUN  --    < > 32*  --  30* 29* 27*  --  17  CREATININE  --    < > 1.62*  --  1.40* 1.28* 1.16*  --  0.79   CALCIUM  --    < > 7.9*  --  8.4* 8.7* 8.6*  --  8.0*  MG 2.2  --   --  2.0  --   --  1.6* 1.4* 1.4*  PHOS 2.2*  --   --  1.5*  --   --  1.6* 2.8  --    < > = values in this interval not displayed.    CBC: Recent Labs  Lab 11/01/18 1535  11/02/18 0352  11/03/18 0520 11/03/18 1205 11/04/18 0449 11/04/18 0841 11/05/18 0459 11/06/18 0432  WBC 17.0*  --  17.1*  --  13.6*  --  19.8*  --  12.8* 12.2*  NEUTROABS 15.5*  --   --   --   --   --  17.0*  --   --  7.6  HGB 11.0*   < > 8.9*   < > 8.2* 8.8* 8.3* 10.2* 8.8* 8.4*  HCT 33.6*   < > 28.2*   < > 25.3* 26.0* 25.4* 30.0* 26.0* 25.2*  MCV 69.3*  --  70.0*  --  68.8*  --  68.6*  --  65.2* 65.5*  PLT 179  --  111*  --  57*  --  48*  --  58* 74*   < > = values in this interval not displayed.    Cardiac Enzymes: Recent Labs  Lab 11/04/18 1130  CKTOTAL 211  CKMB 6.3*    Imaging: Ct Head Wo Contrast  Result Date: 11/06/2018 CLINICAL DATA:  Encephalopathy EXAM: CT HEAD WITHOUT CONTRAST TECHNIQUE: Contiguous axial images were obtained from the base of the skull through the vertex without intravenous contrast. COMPARISON:  None. FINDINGS: Brain: Ventricle size normal.  No acute hemorrhage. Extensive hypodensity throughout the body and splenium of the corpus callosum. Anterior corpus callosum spared. No associated  acute hemorrhage. 10 x 15 mm hypodensity left posterior cerebellum could be acute or chronic. No priors. Vascular: Negative for hyperdense vessel Skull: Negative Sinuses/Orbits: Mucosal edema paranasal sinuses. Negative orbital lesion. Bilateral cataract surgery. Other: None IMPRESSION: 10 x 15 mm hypodensity left posterior cerebellum. This could represent infarct of either acute or chronic duration. Mass lesion considered less likely. Extensive low density body and splenium of corpus callosum. Differential includes necrosis related to alcohol abuse or B vitamin deficiency. This is more extensive than chronic ischemia. No definite  tumor. Differential includes toxic metabolic encephalopathy and osmotic demyelinization. Recommend MRI brain without and with contrast for further evaluation. These results were called by telephone at the time of interpretation on 11/06/2018 at 12:14 pm to provider [facility] , who verbally acknowledged these results. Electronically Signed   By: Franchot Gallo M.D.   On: 11/06/2018 12:14   MRI brain with and without contrast: 1. Restricted diffusion, swelling and edema within the callosal body and splenium as well as left cingulate gyrus. Additional punctate focus of restricted diffusion and edema within the left globus pallidus. Findings may reflect acute ischemic infarcts. However, toxic/metabolic etiologies should also be considered. 2. Acute left cerebellar infarct. 3. Bilateral mastoid effusions, large on the right. Fluid signal also present within the right eustachian tube.  Assessment: 68 y.o. female with a PMHx of DM ,and HTN who presented to hospital with c/o of 2-3  day history of fever, fatigue and some abdominal pain 1. CTH revealed CTH:10 x 15 mm hypodensity left posterior cerebellum. 2. Exam: patient able to follow some simple commands. Waxing and waning motor function that has varied with level of agitation and sedation. Best strength observed was during MRI brain when she was agitated, flailing all extremities and striking guardrail of bed. On NP exam she was not able to break gravity with any extremity. On attending exam, she slowly withdrew BLE to noxious, left more than right but did not move BUE to light pinch. 3. MRI brain reveals 2 medium sized regions of restricted diffusion, one with associated swelling and edema within the callosal body and splenium as well as left cingulate gyrus and a second in the left cerebellar, the latter appearing most consistent with an acute infarction. There is an Additional punctate focus of restricted diffusion and edema within the left  globus pallidus. All of the lesions appear most likely to be secondary to small vessel infarctions, increasing the likelihood of a small vessel vasculitis.   Recommendations: -- MRI cervical and thoracic spine STAT -- Will need STAT LP after MRI. Send for cell count, protein, glucose, VZV PCR, HSV PCR, HIV PCR, cryptococcal antigen, gram stain, fungal culture and bacterial culture, VDRL -- Given neck stiffness and elevated white count as well as presentation with encephalopathy and fever, meningitis is also relatively high on the DDx. Would start empiric antibiotics with ampicillin, vancomycin and acyclovir. Would call pharmacy to ensure that ceftriaxone is being infused at meningitis dose and frequency. Have called Pharmacy and they are dosing.   Laurey Morale, MSN, NP-C [facility] [phone]  I have seen and examined the patient. 68 year old female presenting with fever, abdominal pain and AMS. Exams have changed during the day and are affectecd by level of agitation and sedation. Best localization is bihemispheric cerebral dysfunction with possible cervical or thoracic spinal cord lesion. Will need STAT MRI of cervical and thoracic spine to evaluate for possible cord compression or abscess as well as LP. Empiric meningitis  dose ABX have been ordered with pharmacy assistance appreciated.    Electronically signed: Dr. Kerney Elbe 11/06/2018, 12:28 PM

## 2018-11-06 NOTE — Progress Notes (Signed)
NAME:  Aimee Parker, MRN:  [id number], DOB:  1950/07/30, LOS: 5 ADMISSION DATE:  11/01/2018, CONSULTATION DATE: 11/04/2018 REFERRING MD: Dr. Mcarthur Rossetti, CHIEF COMPLAINT: Sepsis  Brief History   68 y.o. F with PMH of DM and HTN who presents with 2-3 days of fever, malaise and fatigue.  She was found to have probable bilateral PNA and UTI in the ED with lactic acidosis and required Bipap, therefore PCCM was consulted.  Developed worsening respiratory distress with increased work of breathing and accessory muscle use requiring endotracheal intubation 11/02/2018     History of present illness   68 y.o. F who coming from home with PMH of DM and HTN who c/o fever, malaise, fatigue and some abdominal pain. She has been in her normal state of health until onset of symptoms and initially presented to urgent care, then to the ED and was initially febrile to 103F with tachycardia and eventually oxygen sats dropped in the 80%'s and pt was placed on Bipap.  In the ED, labs were significant for lactic acid of 4.3, WBC 17k, CO2 19 with gap of 15, creatinine 1.67 (baseline ~1.0) troponin 334 and UA significant for leuks and bacteria and +nitrite positive. She received 4L IVF, cefepime and vancomycin.  CT chest notable for bilateral basilar infiltrates and ground glass opacities. She was initially admitted to internal medicine and as BP was down-trending and she was requiring Bipap, PCCM was consulted.  On initial evaluation, pt is awake and responsive and denies any complaints.  Interviewed with translation assistance from family [facility] interpreter on the way).  She denies any chest pain, current abdominal pain, recent URI symptoms, diarrhea or other complaints.  No recent UTI's or hospitalizations.   Past Medical History  Vitamin D deficiency Hypertension Type 2 diabetes  Significant Hospital Events   10/20 Admit to [facility] 10/21 Worsening respiratory distress requiring intubation  Consults:    Procedures:   EET 10/21 >>  A-line 10/21 >> Left IJ CVC 10/21 >>   Significant Diagnostic Tests:  10/20 CT chest >> Patchy airspace areas of consolidation in the posterior lower lungs with ground-glass opacities in the upper lungs   10/20 CT abd/pelvis >> Heterogeneous left renal nephrogram with heterogenous striated appearance consistent with pyelonephritis, moderate left perinephritic edema and mild left ureteral prominence. Wall thickening of splenic flexure of colon likely due to renal inflammation   Micro Data:  10/20 Sars-CoV-2 >> neg 10/20 Blood culture >>  Enterobacteriaceae and Ecoli  10/20 UC  >> 10/21 RVP  >> neg 10/22 Sputum Culture >>  Antimicrobials:  Vancomycin 10/20 >> 10/21 Cefepime 10/20 >>   Interim history/subjective:  Patient does open eyes, does not follow commands Stares into space, did not withdraw to pain  Objective   Blood pressure 129/73, pulse 90, temperature 99.9 F (37.7 C), resp. rate 18, height 5\' 4"  (1.626 m), weight 70.2 kg, SpO2 97 %.    Vent Mode: PSV FiO2 (%):  [40 %] 40 % Set Rate:  [18 bmp] 18 bmp Vt Set:  [430 mL] 430 mL PEEP:  [5 cmH20-8 cmH20] 5 cmH20 Pressure Support:  [5 cmH20] 5 cmH20 Plateau Pressure:  [15 cmH20-17 cmH20] 17 cmH20   Intake/Output Summary (Last 24 hours) at 11/06/2018 1036 Last data filed at 11/06/2018 0812 Gross per 24 hour  Intake 3462.8 ml  Output 1565 ml  Net 1897.8 ml   Filed Weights   11/04/18 0500 11/05/18 0500 11/06/18 0500  Weight: 74.5 kg 66.7 kg 70.2 kg  Examination: Ambulatory General: Chronically ill-appearing on mechanical ventilation HENT: Moist oral mucosa Lungs: clear breath sounds Cardiovascular: S1-S2 appreciated Abdomen: Soft, nontender, bowel sounds appreciated Extremities: Warm/dry, no pedal edema Neuro: Not following commands, opens eyes to verbal stimuli, not withdrawing to pain GU:   Resolved Hospital Problem list    Assessment & Plan:  Acute hypoxic respiratory failure   -Continue ventilator support -Daily SBT -VAP bundle -Follow cultures  Encephalopathy -Likely secondary to sepsis -Altered mentation is concerning with her nonpurposeful interactions -Will obtain a CT scan of the head -Obtain ammonia level  Hypertension -Labetalol as needed  Sepsis secondary to E. coli bacteremia Pyelonephritis -Continue IV antibiotics -Follow cultures  Metabolic acidosis -Continue IV hydration -Trend electrolytes  Acute kidney injury -Continue IV hydration --Nephrotoxic's  Thrombocytopenia -Subcu heparin discontinued -Monitor platelets -Likely secondary to sepsis and bacteremia  Electrolyte derangement including Hypokalemia Hypomagnesemia -Being corrected-received magnesium and potassium this morning -Trend electrolytes  Elevated troponin -Demand ischemia  Follow CT scan of the head Monitor electrolytes  Best practice:  Diet: Continue tube feeds Pain/Anxiety/Delirium protocol (if indicated): Fentanyl drip VAP protocol (if indicated): In place DVT prophylaxis: SCDs, heparin GI prophylaxis: PPI Glucose control: SSI Mobility: Bedrest Code Status: Full code Family Communication: Spoke with daughter at bedside Disposition: ICU  Labs   CBC: Recent Labs  Lab 11/01/18 1535  11/02/18 0352  11/03/18 0520 11/03/18 1205 11/04/18 0449 11/04/18 0841 11/05/18 0459 11/06/18 0432  WBC 17.0*  --  17.1*  --  13.6*  --  19.8*  --  12.8* 12.2*  NEUTROABS 15.5*  --   --   --   --   --  17.0*  --   --  7.6  HGB 11.0*   < > 8.9*   < > 8.2* 8.8* 8.3* 10.2* 8.8* 8.4*  HCT 33.6*   < > 28.2*   < > 25.3* 26.0* 25.4* 30.0* 26.0* 25.2*  MCV 69.3*  --  70.0*  --  68.8*  --  68.6*  --  65.2* 65.5*  PLT 179  --  111*  --  57*  --  48*  --  58* 74*   < > = values in this interval not displayed.    Basic Metabolic Panel: Recent Labs  Lab 11/04/18 1130  11/04/18 1538 11/04/18 1720 11/04/18 2006 11/05/18 0114 11/05/18 0459 11/05/18 1725 11/06/18 0432   NA  --    < > 142  --  143 143 145  --  140  K  --    < > 3.6  --  3.2* 3.5 3.2*  --  3.3*  CL  --    < > 110  --  108 106 107  --  102  CO2  --    < > 18*  --  23 25 27   --  28  GLUCOSE  --    < > 150*  --  183* 195* 163*  --  185*  BUN  --    < > 32*  --  30* 29* 27*  --  17  CREATININE  --    < > 1.62*  --  1.40* 1.28* 1.16*  --  0.79  CALCIUM  --    < > 7.9*  --  8.4* 8.7* 8.6*  --  8.0*  MG 2.2  --   --  2.0  --   --  1.6* 1.4* 1.4*  PHOS 2.2*  --   --  1.5*  --   --  1.6* 2.8  --    < > = values in this interval not displayed.   GFR: Estimated Creatinine Clearance: 64.7 mL/min (by C-G formula based on SCr of 0.79 mg/dL). Recent Labs  Lab 11/02/18 0620 11/02/18 1815 11/02/18 2158 11/03/18 0520 11/04/18 0449 11/05/18 0459 11/06/18 0432  WBC  --   --   --  13.6* 19.8* 12.8* 12.2*  LATICACIDVEN 5.2* 4.1* 3.7* 2.6*  --   --   --     Liver Function Tests: Recent Labs  Lab 11/01/18 1535  AST 37  ALT 33  ALKPHOS 68  BILITOT 1.0  PROT 7.6  ALBUMIN 3.6   No results for input(s): LIPASE, AMYLASE in the last 168 hours. No results for input(s): AMMONIA in the last 168 hours.  ABG    Component Value Date/Time   PHART 7.334 (L) 11/04/2018 0841   PCO2ART 27.1 (L) 11/04/2018 0841   PO2ART 79.0 (L) 11/04/2018 0841   HCO3 14.4 (L) 11/04/2018 0841   TCO2 15 (L) 11/04/2018 0841   ACIDBASEDEF 10.0 (H) 11/04/2018 0841   O2SAT 95.0 11/04/2018 0841     Coagulation Profile: No results for input(s): INR, PROTIME in the last 168 hours.  Cardiac Enzymes: Recent Labs  Lab 11/04/18 1130  CKTOTAL 211  CKMB 6.3*     CBG: Recent Labs  Lab 11/06/18 0629 11/06/18 0709 11/06/18 0800 11/06/18 0908 11/06/18 1029  GLUCAP 163* 169* 173* 164* 143*   The patient is critically ill with multiple organ systems failure and requires high complexity decision making for assessment and support, frequent evaluation and titration of therapies, application of advanced monitoring  technologies and extensive interpretation of multiple databases. Critical Care Time devoted to patient care services described in this note independent of APP/resident time (if applicable)  is 35 minutes.   Sherrilyn Rist MD [facility] Personal pager: [phone] If unanswered, please page CCM On-call: [phone]

## 2018-11-06 NOTE — Progress Notes (Signed)
[facility] Progress Note Patient Name: Aimee Parker DOB: 08/18/1950 MRN: [id number]   Date of Service  11/06/2018  HPI/Events of Note  Request for orders for AM labs.   eICU Interventions  Will order CBC with platelets, BMP and Mg++ level in AM.      Intervention Category Major Interventions: Other:  Lysle Dingwall 11/06/2018, 2:08 AM

## 2018-11-06 NOTE — Progress Notes (Signed)
Placed patient on wean 5/5 40%, patient tolerated well

## 2018-11-06 NOTE — Progress Notes (Signed)
[facility] ADULT ICU REPLACEMENT PROTOCOL FOR AM LAB REPLACEMENT ONLY  The patient does apply for the [facility] Adult ICU Electrolyte Replacment Protocol based on the criteria listed below:   1. Is GFR >/= 40 ml/min? Yes.    Patient's GFR today is >60 2. Is urine output >/= 0.5 ml/kg/hr for the last 6 hours? Yes.   Patient's UOP is 1.42 ml/kg/hr 3. Is BUN < 60 mg/dL? Yes.    Patient's BUN today is 17 4. Abnormal electrolyte  K 3.3, Mg 1.4 5. Ordered repletion with: protocol 6. If a panic level lab has been reported, has the CCM MD in charge been notified? Yes.  .   Physician:  Illene Labrador, Canary Brim 11/06/2018 6:40 AM

## 2018-11-06 NOTE — Progress Notes (Signed)
Pharmacy Antibiotic Note  Aimee Parker is a 68 y.o. female with encephalopathy and possible meningitis. Pharmacy has been consulted for vancomycin and acyclovir dosing (ampicillin has also been started). Plans noted for LP -WBC= 12.2, tmax= 100.6, SCr= 0.79  Plan: -vancomycin 1500mg  IV x1 followed by 750mg  IV q12h -Acyclovir 700mg  IV q8h -Continue ampicillin 2gm IV q4h -Change rocephin to 2gm IV q12h -Will follow renal function, cultures and clinical progress    Height: 5\' 4"  (162.6 cm) Weight: 154 lb 12.2 oz (70.2 kg) IBW/kg (Calculated) : 54.7  Temp (24hrs), Avg:100 F (37.8 C), Min:99.1 F (37.3 C), Max:100.6 F (38.1 C)  Recent Labs  Lab 11/02/18 0352 11/02/18 0620 11/02/18 1815 11/02/18 2158 11/03/18 0520 11/04/18 0449  11/04/18 1538 11/04/18 2006 11/05/18 0114 11/05/18 0459 11/06/18 0432  WBC 17.1*  --   --   --  13.6* 19.8*  --   --   --   --  12.8* 12.2*  CREATININE 1.96*  --   --   --  1.99* 2.01*   < > 1.62* 1.40* 1.28* 1.16* 0.79  LATICACIDVEN 4.6* 5.2* 4.1* 3.7* 2.6*  --   --   --   --   --   --   --    < > = values in this interval not displayed.    Estimated Creatinine Clearance: 64.7 mL/min (by C-G formula based on SCr of 0.79 mg/dL).    No Known Allergies   Thank you for allowing pharmacy to be a part of this patient's care.  Hildred Laser, PharmD Clinical Pharmacist **Pharmacist phone directory can now be found on [email or web address] ([email or web address]).  Listed under [facility].

## 2018-11-06 NOTE — Progress Notes (Signed)
Informed about MRI results

## 2018-11-07 ENCOUNTER — Inpatient Hospital Stay (HOSPITAL_COMMUNITY): Payer: Medicare Other

## 2018-11-07 DIAGNOSIS — I6340 Cerebral infarction due to embolism of unspecified cerebral artery: Secondary | ICD-10-CM | POA: Diagnosis present

## 2018-11-07 DIAGNOSIS — R7881 Bacteremia: Secondary | ICD-10-CM | POA: Diagnosis not present

## 2018-11-07 DIAGNOSIS — M542 Cervicalgia: Secondary | ICD-10-CM

## 2018-11-07 DIAGNOSIS — J9601 Acute respiratory failure with hypoxia: Secondary | ICD-10-CM | POA: Diagnosis not present

## 2018-11-07 DIAGNOSIS — N179 Acute kidney failure, unspecified: Secondary | ICD-10-CM | POA: Diagnosis not present

## 2018-11-07 DIAGNOSIS — R6521 Severe sepsis with septic shock: Secondary | ICD-10-CM | POA: Diagnosis not present

## 2018-11-07 DIAGNOSIS — B9620 Unspecified Escherichia coli [E. coli] as the cause of diseases classified elsewhere: Secondary | ICD-10-CM | POA: Diagnosis not present

## 2018-11-07 DIAGNOSIS — G9340 Encephalopathy, unspecified: Secondary | ICD-10-CM

## 2018-11-07 DIAGNOSIS — Z9911 Dependence on respirator [ventilator] status: Secondary | ICD-10-CM

## 2018-11-07 DIAGNOSIS — A419 Sepsis, unspecified organism: Secondary | ICD-10-CM | POA: Diagnosis not present

## 2018-11-07 DIAGNOSIS — N12 Tubulo-interstitial nephritis, not specified as acute or chronic: Secondary | ICD-10-CM | POA: Diagnosis not present

## 2018-11-07 DIAGNOSIS — I10 Essential (primary) hypertension: Secondary | ICD-10-CM

## 2018-11-07 DIAGNOSIS — E119 Type 2 diabetes mellitus without complications: Secondary | ICD-10-CM

## 2018-11-07 DIAGNOSIS — I6310 Cerebral infarction due to embolism of unspecified precerebral artery: Secondary | ICD-10-CM

## 2018-11-07 LAB — LIPID PANEL
Cholesterol: 141 mg/dL (ref 0–200)
HDL: 18 mg/dL — ABNORMAL LOW (ref >40)
LDL Cholesterol: 97 mg/dL (ref 0–99)
Total CHOL/HDL Ratio: 7.8 RATIO
Triglycerides: 132 mg/dL (ref <150)
VLDL: 26 mg/dL (ref 0–40)

## 2018-11-07 LAB — CBC
HCT: 26.6 % — ABNORMAL LOW (ref 36.0–46.0)
Hemoglobin: 8.7 g/dL — ABNORMAL LOW (ref 12.0–15.0)
MCH: 21.7 pg — ABNORMAL LOW (ref 26.0–34.0)
MCHC: 32.7 g/dL (ref 30.0–36.0)
MCV: 66.3 fL — ABNORMAL LOW (ref 80.0–100.0)
Platelets: 123 10*3/uL — ABNORMAL LOW (ref 150–400)
RBC: 4.01 MIL/uL (ref 3.87–5.11)
RDW: 14.2 % (ref 11.5–15.5)
WBC: 14 10*3/uL — ABNORMAL HIGH (ref 4.0–10.5)
nRBC: 0.2 % (ref 0.0–0.2)

## 2018-11-07 LAB — GLUCOSE, CAPILLARY
Glucose-Capillary: 130 mg/dL — ABNORMAL HIGH (ref 70–99)
Glucose-Capillary: 136 mg/dL — ABNORMAL HIGH (ref 70–99)
Glucose-Capillary: 137 mg/dL — ABNORMAL HIGH (ref 70–99)
Glucose-Capillary: 142 mg/dL — ABNORMAL HIGH (ref 70–99)
Glucose-Capillary: 145 mg/dL — ABNORMAL HIGH (ref 70–99)
Glucose-Capillary: 151 mg/dL — ABNORMAL HIGH (ref 70–99)
Glucose-Capillary: 156 mg/dL — ABNORMAL HIGH (ref 70–99)
Glucose-Capillary: 158 mg/dL — ABNORMAL HIGH (ref 70–99)
Glucose-Capillary: 160 mg/dL — ABNORMAL HIGH (ref 70–99)
Glucose-Capillary: 168 mg/dL — ABNORMAL HIGH (ref 70–99)
Glucose-Capillary: 178 mg/dL — ABNORMAL HIGH (ref 70–99)
Glucose-Capillary: 183 mg/dL — ABNORMAL HIGH (ref 70–99)
Glucose-Capillary: 197 mg/dL — ABNORMAL HIGH (ref 70–99)
Glucose-Capillary: 306 mg/dL — ABNORMAL HIGH (ref 70–99)
Glucose-Capillary: 68 mg/dL — ABNORMAL LOW (ref 70–99)

## 2018-11-07 LAB — BASIC METABOLIC PANEL
Anion gap: 11 (ref 5–15)
BUN: 17 mg/dL (ref 8–23)
CO2: 28 mmol/L (ref 22–32)
Calcium: 7.9 mg/dL — ABNORMAL LOW (ref 8.9–10.3)
Chloride: 97 mmol/L — ABNORMAL LOW (ref 98–111)
Creatinine, Ser: 0.64 mg/dL (ref 0.44–1.00)
GFR calc Af Amer: >60 mL/min (ref >60)
GFR calc non Af Amer: >60 mL/min (ref >60)
Glucose, Bld: 175 mg/dL — ABNORMAL HIGH (ref 70–99)
Potassium: 3.2 mmol/L — ABNORMAL LOW (ref 3.5–5.1)
Sodium: 136 mmol/L (ref 135–145)

## 2018-11-07 LAB — PROTIME-INR
INR: 1 (ref 0.8–1.2)
Prothrombin Time: 13.4 seconds (ref 11.4–15.2)

## 2018-11-07 LAB — MAGNESIUM: Magnesium: 2 mg/dL (ref 1.7–2.4)

## 2018-11-07 MED ORDER — INSULIN GLARGINE 100 UNIT/ML ~~LOC~~ SOLN
20.0000 [IU] | SUBCUTANEOUS | Status: DC
  Administered 2018-11-07 – 2018-11-15 (×9): 20 [IU] via SUBCUTANEOUS
  Filled 2018-11-07 (×9): qty 0.2

## 2018-11-07 MED ORDER — ALBUTEROL SULFATE (2.5 MG/3ML) 0.083% IN NEBU
INHALATION_SOLUTION | RESPIRATORY_TRACT | Status: AC
  Filled 2018-11-07: qty 3

## 2018-11-07 MED ORDER — POTASSIUM CHLORIDE 20 MEQ/15ML (10%) PO SOLN
30.0000 meq | ORAL | Status: AC
  Administered 2018-11-07 (×2): 30 meq
  Filled 2018-11-07 (×2): qty 30

## 2018-11-07 MED ORDER — ALBUTEROL SULFATE (2.5 MG/3ML) 0.083% IN NEBU
2.5000 mg | INHALATION_SOLUTION | RESPIRATORY_TRACT | Status: DC | PRN
  Administered 2018-11-07 – 2018-11-10 (×3): 2.5 mg via RESPIRATORY_TRACT
  Filled 2018-11-07 (×3): qty 3

## 2018-11-07 MED ORDER — IOHEXOL 350 MG/ML SOLN
80.0000 mL | Freq: Once | INTRAVENOUS | Status: AC | PRN
  Administered 2018-11-07: 80 mL via INTRAVENOUS

## 2018-11-07 MED ORDER — INSULIN ASPART 100 UNIT/ML ~~LOC~~ SOLN
0.0000 [IU] | SUBCUTANEOUS | Status: DC
  Administered 2018-11-07: 17:00:00 2 [IU] via SUBCUTANEOUS
  Administered 2018-11-07: 11 [IU] via SUBCUTANEOUS
  Administered 2018-11-08 (×2): 3 [IU] via SUBCUTANEOUS
  Administered 2018-11-08 (×2): 5 [IU] via SUBCUTANEOUS
  Administered 2018-11-08: 3 [IU] via SUBCUTANEOUS
  Administered 2018-11-08 – 2018-11-09 (×3): 5 [IU] via SUBCUTANEOUS
  Administered 2018-11-09: 3 [IU] via SUBCUTANEOUS
  Administered 2018-11-09 (×2): 2 [IU] via SUBCUTANEOUS
  Administered 2018-11-10 (×2): 3 [IU] via SUBCUTANEOUS
  Administered 2018-11-10: 8 [IU] via SUBCUTANEOUS
  Administered 2018-11-10 – 2018-11-11 (×4): 2 [IU] via SUBCUTANEOUS
  Administered 2018-11-11: 17:00:00 5 [IU] via SUBCUTANEOUS
  Administered 2018-11-11: 09:00:00 2 [IU] via SUBCUTANEOUS
  Administered 2018-11-11: 3 [IU] via SUBCUTANEOUS
  Administered 2018-11-11: 5 [IU] via SUBCUTANEOUS
  Administered 2018-11-11 – 2018-11-12 (×2): 3 [IU] via SUBCUTANEOUS
  Administered 2018-11-12: 13:00:00 2 [IU] via SUBCUTANEOUS
  Administered 2018-11-13: 3 [IU] via SUBCUTANEOUS
  Administered 2018-11-13: 5 [IU] via SUBCUTANEOUS
  Administered 2018-11-13: 2 [IU] via SUBCUTANEOUS

## 2018-11-07 NOTE — Progress Notes (Addendum)
STROKE TEAM PROGRESS NOTE   HISTORY OF PRESENT ILLNESS (per record) Aimee Parker is an 68 y.o. [place] female w/PMH DM , HTN who presented to hospital with AMS. Her daughter states she c/o of 2-3 day history of fever, fatigue and some abdominal pain.   INTERVAL HISTORY Her daughter is at the bedside. Unfortunately, could not get an interpreter available via stratus, but pts family member available by phone who was bilingual assisted with asking pt to follow commands to conduct our neuro exam. Per family they state she "looks better" today. She is awake and now following commands. However, she remains critically ill and quite encephalopathic.  I have personally reviewed history of presenting illness, electronic medical records and imaging films in PACS  OBJECTIVE Vitals:   11/07/18 1300 11/07/18 1400 11/07/18 1500 11/07/18 1536  BP: (!) 110/53 (!) 112/56 (!) 126/56 (!) 126/56  Pulse: 93 89 88 96  Resp: _0 Temp:      TempSrc:      SpO2: 99% 100% 95% 100%  Weight:      Height:        CBC:  Recent Labs  Lab 11/04/18 0449  11/06/18 0432 11/07/18 1139  WBC 19.8*   < > 12.2* 14.0*  NEUTROABS 17.0*  --  7.6  --   HGB 8.3*   < > 8.4* 8.7*  HCT 25.4*   < > 25.2* 26.6*  MCV 68.6*   < > 65.5* 66.3*  PLT 48*   < > 74* 123*   < > = values in this interval not displayed.    Basic Metabolic Panel:  Recent Labs  Lab 11/05/18 0459 11/05/18 1725 11/06/18 0432 11/06/18 2000 11/07/18 0336  NA 145  --  140  --  136  K 3.2*  --  3.3*  --  3.2*  CL 107  --  102  --  97*  CO2 27  --  28  --  28  GLUCOSE 163*  --  185*  --  175*  BUN 27*  --  17  --  17  CREATININE 1.16*  --  0.79  --  0.64  CALCIUM 8.6*  --  8.0*  --  7.9*  MG 1.6* 1.4* 1.4* 2.3 2.0  PHOS 1.6* 2.8  --   --   --     Lipid Panel:     Component Value Date/Time   CHOL 141 11/07/2018 1139   CHOL 174 08/30/2018 1056   TRIG 132 11/07/2018 1139   HDL 18 (L) 11/07/2018 1139   HDL 53 08/30/2018 1056   CHOLHDL 7.8 11/07/2018 1139   VLDL 26 11/07/2018 1139   LDLCALC 97 11/07/2018 1139   LDLCALC 91 08/30/2018 1056   HgbA1c:  Lab Results  Component Value Date   HGBA1C 8.1 (H) 11/02/2018   Urine Drug Screen: No results found for: LABOPIA, COCAINSCRNUR, LABBENZ, AMPHETMU, THCU, LABBARB  Alcohol Level No results found for: ETH  IMAGING   Ct Head Wo Contrast  Result Date: 11/06/2018 CLINICAL DATA:  Encephalopathy EXAM: CT HEAD WITHOUT CONTRAST TECHNIQUE: Contiguous axial images were obtained from the base of the skull through the vertex without intravenous contrast. COMPARISON:  None. FINDINGS: Brain: Ventricle size normal.  No acute hemorrhage. Extensive hypodensity throughout the body and splenium of the corpus callosum. Anterior corpus callosum spared. No associated acute hemorrhage. 10 x 15 mm hypodensity left posterior cerebellum could be acute or chronic. No priors. Vascular: Negative for hyperdense vessel  Skull: Negative Sinuses/Orbits: Mucosal edema paranasal sinuses. Negative orbital lesion. Bilateral cataract surgery. Other: None IMPRESSION: 10 x 15 mm hypodensity left posterior cerebellum. This could represent infarct of either acute or chronic duration. Mass lesion considered less likely. Extensive low density body and splenium of corpus callosum. Differential includes necrosis related to alcohol abuse or B vitamin deficiency. This is more extensive than chronic ischemia. No definite tumor. Differential includes toxic metabolic encephalopathy and osmotic demyelinization. Recommend MRI brain without and with contrast for further evaluation. These results were called by telephone at the time of interpretation on 11/06/2018 at 12:14 pm to provider [facility] , who verbally acknowledged these results. Electronically Signed   By: Franchot Gallo M.D.   On: 11/06/2018 12:14   Mr Jeri Cos LG Contrast  Result Date: 11/06/2018 CLINICAL DATA:  Abnormal CT head with lesion of corpus callosum  EXAM: MRI HEAD WITHOUT AND WITH CONTRAST TECHNIQUE: Multiplanar, multiecho pulse sequences of the brain and surrounding structures were obtained without and with intravenous contrast. CONTRAST:  36m GADAVIST GADOBUTROL 1 MMOL/ML IV SOLN COMPARISON:  Noncontrast head CT 11/06/2018 FINDINGS: Brain: There is extensive restricted diffusion with corresponding swelling and T2/FLAIR hyperintensity within the callosal body eccentric to the left, also extending posteriorly to involve the midline callosal splenium. There is also involvement of the left cingulate gyrus. No corresponding abnormal enhancement. Additional punctate focus of restricted diffuse and T2/FLAIR hyperintensity within the left globus pallidus (series 5, image 78). Acute infarct within the left cerebellum measuring 2.0 x 1.4 cm in transaxial dimensions. Corresponding T2/FLAIR hyperintensity at this site. No significant white matter disease for age. Additional small T2 hyperintense foci within the bilateral basal ganglia are favored to reflect prominent perivascular spaces. Cerebral volume is normal for age. Partially empty sella turcica. No abnormal intracranial enhancement elsewhere. Vascular: Flow voids maintained within the proximal large arterial vessels. Expected enhancement within the dural venous sinuses. Skull and upper cervical spine: No focal marrow lesion. C3-C4 posterior disc osteophyte contributing to mild spinal canal stenosis. Sinuses/Orbits: Visualized orbits demonstrate no acute abnormality. Mild scattered paranasal sinus mucosal thickening. Bilateral mastoid effusions, large on the right. Fluid signal is also present within the right eustachian tube. These results were called by telephone at the time of interpretation on 11/06/2018 at 4:37 pm to provider ASherrilyn Rist, who verbally acknowledged these results. IMPRESSION: 1. Restricted diffusion, swelling and edema within the callosal body and splenium as well as left cingulate gyrus.  Additional punctate focus of restricted diffusion and edema within the left globus pallidus. Findings may reflect acute ischemic infarcts. However, toxic/metabolic etiologies should also be considered. 2. Acute left cerebellar infarct. 3. Bilateral mastoid effusions, large on the right. Fluid signal also present within the right eustachian tube. Electronically Signed   By: KKellie SimmeringDO   On: 11/06/2018 16:38     Transthoracic Echocardiogram   Recent Results (from the past 43800 hour(s))  ECHOCARDIOGRAM COMPLETE   Collection Time: 11/02/18  8:17 AM  Result Value   Weight 2,405.66   Height 64   BP 113/58   Narrative     ECHOCARDIOGRAM REPORT       Patient Name:   Aimee Parker Date of Exam: 11/02/2018 Medical Rec #:  [id number]     Height:       64.0 in Accession #:    [id number]    Weight:       150.4 lb Date of Birth:  2February 03, 1952     BSA:  1.73 m Patient Age:    70 years       BP:           113/58 mmHg Patient Gender: F              HR:           114 bpm. Exam Location:  Inpatient  Procedure: 2D Echo, Color Doppler and Cardiac Doppler  STAT ECHO  Indications:    R06.9 DOE   History:        Patient has prior history of Echocardiogram examinations, most                 recent 07/20/2013.   Sonographer:    Raquel Sarna Senior RDCS Referring Phys: [place]    Sonographer Comments: Patient supine on CPAP with respiration rate of 45. IMPRESSIONS    1. Left ventricular ejection fraction, by visual estimation, is 60 to 65%. The left ventricle has normal function. There is moderately increased left ventricular hypertrophy.  2. Left ventricular diastolic function could not be evaluated pattern of LV diastolic filling.  3. Global right ventricle has hyperdynamic systolic function.The right ventricular size is normal. No increase in right ventricular wall thickness.  4. Left atrial size was moderately dilated.  5. Right atrial size was normal.  6. Presence of  pericardial fat pad.  7. Trivial pericardial effusion is present.  8. Moderate mitral annular calcification.  9. The mitral valve is grossly normal. Trace mitral valve regurgitation. 10. The tricuspid valve is normal in structure. Tricuspid valve regurgitation is trivial. 11. The aortic valve is grossly normal Aortic valve regurgitation was not visualized by color flow Doppler. Mild aortic valve sclerosis without stenosis. 12. The pulmonic valve was not well visualized. Pulmonic valve regurgitation is not visualized by color flow Doppler. 13. Mildly elevated pulmonary artery systolic pressure. 14. Difficult study, with very limited parasternal windows. Appears to have normal EF, no gross wall motion abnormalities but limited sensitivity. RV is normal to small and hyperdynamic. LV has thickened basal septum but no clear obstruction seen.  Tachycardic in the 110s throughout study. 15. Cannot exclude small left to righ shunt through PFO (image 83).  FINDINGS  Left Ventricle: Left ventricular ejection fraction, by visual estimation, is 60 to 65%. The left ventricle has normal function. No evidence of left ventricular regional wall motion abnormalities. There is moderately increased left ventricular  hypertrophy. Concentric left ventricular hypertrophy. Spectral Doppler shows Left ventricular diastolic function could not be evaluated pattern of LV diastolic filling.  Right Ventricle: The right ventricular size is normal. No increase in right ventricular wall thickness. Global RV systolic function is has hyperdynamic systolic function. The tricuspid regurgitant velocity is 2.36 m/s, and with an assumed right atrial  pressure of 15 mmHg, the estimated right ventricular systolic pressure is mildly elevated at 37.3 mmHg.  Left Atrium: Left atrial size was moderately dilated.  Right Atrium: Right atrial size was normal in size  Pericardium: Trivial pericardial effusion is present. Presence of  pericardial fat pad.  Mitral Valve: The mitral valve is grossly normal. Moderate mitral annular calcification. MV peak gradient, 11.3 mmHg. Trace mitral valve regurgitation.  Tricuspid Valve: The tricuspid valve is normal in structure. Tricuspid valve regurgitation is trivial by color flow Doppler.  Aortic Valve: The aortic valve is grossly normal. Aortic valve regurgitation was not visualized by color flow Doppler. Mild aortic valve sclerosis is present, with no evidence of aortic valve stenosis. Aortic valve mean gradient measures 8.0  mmHg. Aortic  valve peak gradient measures 11.8 mmHg. Aortic valve area, by VTI measures 2.20 cm.  Pulmonic Valve: The pulmonic valve was not well visualized. Pulmonic valve regurgitation is not visualized by color flow Doppler.  Aorta: The aortic root and ascending aorta are structurally normal, with no evidence of dilitation.  Pulmonary Artery: The pulmonary artery is not well seen.  IAS/Shunts: Cannot exclude small left to righ shunt through PFO (image 83).     LEFT VENTRICLE PLAX 2D LVIDd:         4.00 cm LVIDs:         2.70 cm LV PW:         1.50 cm LV IVS:        1.70 cm LVOT diam:     2.00 cm LV SV:         43 ml LV SV Index:   24.30 LVOT Area:     3.14 cm   LV Volumes (MOD) LV area d, A2C:    28.50 cm LV area d, A4C:    24.80 cm LV area s, A2C:    17.80 cm LV area s, A4C:    17.00 cm LV major d, A2C:   8.24 cm LV major d, A4C:   7.88 cm LV major s, A2C:   7.18 cm LV major s, A4C:   7.04 cm LV vol d, MOD A2C: 83.0 ml LV vol d, MOD A4C: 64.7 ml LV vol s, MOD A2C: 38.6 ml LV vol s, MOD A4C: 35.4 ml LV SV MOD A2C:     44.4 ml LV SV MOD A4C:     64.7 ml LV SV MOD BP:      37.5 ml  RIGHT VENTRICLE RV S prime:     13.10 cm/s TAPSE (M-mode): 2.5 cm  LEFT ATRIUM             Index       RIGHT ATRIUM          Index LA diam:        4.40 cm 2.54 cm/m  RA Area:     9.96 cm LA Vol (A2C):   32.1 ml 18.52 ml/m RA Volume:   20.90 ml  12.06 ml/m LA Vol (A4C):   69.4 ml 40.05 ml/m LA Biplane Vol: 50.1 ml 28.91 ml/m  AORTIC VALVE AV Area (Vmax):    1.94 cm AV Area (Vmean):   1.71 cm AV Area (VTI):     2.20 cm AV Vmax:           172.00 cm/s AV Vmean:          134.000 cm/s AV VTI:            0.243 m AV Peak Grad:      11.8 mmHg AV Mean Grad:      8.0 mmHg LVOT Vmax:         106.00 cm/s LVOT Vmean:        73.100 cm/s LVOT VTI:          0.170 m LVOT/AV VTI ratio: 0.70   AORTA Ao Root diam: 2.50 cm Ao Asc diam:  2.80 cm  MITRAL VALVE             TRICUSPID VALVE MV Peak grad: 11.3 mmHg  TR Peak grad:   22.3 mmHg MV Mean grad: 6.0 mmHg   TR Vmax:        236.00 cm/s MV Vmax:      1.68  m/s MV Vmean:     117.0 cm/s SHUNTS MV VTI:       0.24 m     Systemic VTI:  0.17 m                          Systemic Diam: 2.00 cm    Buford Dresser MD Electronically signed by Buford Dresser MD Signature Date/Time: 11/02/2018/8:45:07 AM       Final     *Note: Due to a large number of results and/or encounters for the requested time period, some results have not been displayed. A complete set of results can be found in Results Review.   ECG - SR rate 90-110 BPM. (See cardiology reading for complete details)  PHYSICAL EXAM Blood pressure (!) 126/56, pulse 96, temperature 98.9 F (37.2 C), temperature source Oral, resp. rate 14, height _0  (1.626 m), weight 69.3 kg, SpO2 100 %.  HEENT-  Normocephalic, no lesions, without obvious abnormality.  Normal external eye and conjunctiva.  Neck is stiff in flexion but not rotation.  Cardiovascular-, pulses palpable throughout   Lungs- Intubated, vented Extremities- Developing SQ edema Musculoskeletal-no joint tenderness, deformity or swelling Skin-warm and dry, no hyperpigmentation, vitiligo, or suspicious lesions  Neurological Examination exam performed using [place] interpreter and daughter. Patient receiving fentanyl. Mental Status: Patient intubated, no  propofol, some low dose fentanyl only.  Awake and alert.  Will follow simple commands in all 4 extremities only when spoken in [place] Cough and gag reflex intact, aroused to light touch and verbal stimuli. Patient was able to wiggle her toes on BLE. Able to squeeze hands bilaterally and wiggle fingers as well as stick out her tongue on command (in [place] only).  CN: Patient does not track well, did not cross midline, but seems to attemtp, left gaze deviation noted. Appears to stare off behind examiner and daughter. Seems to be a decreased blink to threat on right. PERRL sluggish.  Face flaccidly symmetric around ETT.   Motor: Diffusely weak throughout. BUE able to grip hands 4-/5 and wiggle fingers.  BLE able to wiggle toes, but unable to break gravity. There was brief break gravity with left arm.  * Per RN, during MRI patient became agitated and began vigorously moving BUE and BLE, slamming limbs against the guardrail of the bed and able to lift antigravity.  Sensory: able to localize to noxious. Plantars: Right: Briskly downgoing                Left: Briskly upgoing Cerebellar/Gait: Unable to perform  HOME MEDICATIONS:  Medications Prior to Admission  Medication Sig Dispense Refill  . Accu-Chek Softclix Lancets lancets Use as instructed. Check blood glucose levels twice per day by fingerstick 200 each 3  . amLODipine (NORVASC) 10 MG tablet Take 1 tablet (10 mg total) by mouth daily. 90 tablet 3  . atorvastatin (LIPITOR) 40 MG tablet Take 1 tablet (40 mg total) by mouth daily. 90 tablet 0  . Blood Glucose Monitoring Suppl (ACCU-CHEK AVIVA PLUS) w/Device KIT 1 each by Does not apply route 3 (three) times daily. METER STOPPED WORKING 1 kit 0  . glimepiride (AMARYL) 2 MG tablet Take 1 tablet (2 mg total) by mouth daily before breakfast. 90 tablet 1  . glucose blood (ACCU-CHEK AVIVA) test strip Use as instructed. Check blood glucose levels twice per day by fingerstick 200 each 3  . glucose  blood test strip Provide patient with insurance approved strips. Use as instructed.  Inject into the skin once daily. 100 each 12  . hydrochlorothiazide (HYDRODIURIL) 25 MG tablet Take 1 tablet (25 mg total) by mouth daily. 90 tablet 3  . lisinopril (ZESTRIL) 40 MG tablet Take 1 tablet (40 mg total) by mouth daily. 90 tablet 3  . sitaGLIPtin-metformin (JANUMET) 50-1000 MG tablet Take 1 tablet by mouth 2 (two) times daily with a meal. 180 tablet 0  . Vitamin D, Ergocalciferol, (DRISDOL) 1.25 MG (50000 UT) CAPS capsule Take 1 capsule (50,000 Units total) by mouth every 7 (seven) days. 12 capsule 3      HOSPITAL MEDICATIONS:  . chlorhexidine gluconate (MEDLINE KIT)  15 mL Mouth Rinse BID  . Chlorhexidine Gluconate Cloth  6 each Topical Daily  . feeding supplement (PRO-STAT SUGAR FREE 64)  30 mL Per Tube BID  . insulin aspart  0-15 Units Subcutaneous Q4H  . insulin glargine  20 Units Subcutaneous Q24H  . mouth rinse  15 mL Mouth Rinse 10 times per day  . pantoprazole (PROTONIX) IV  40 mg Intravenous Daily  . sodium chloride flush  3 mL Intravenous Q12H    ALLERGIES No Known Allergies  ASSESSMENT/PLAN Ms. Aimee Parker is a 68 y.o. female with history of DM and HTN who presented to hospital with AMS. There was a c/o of 2-3  day history of fever, fatigue and some abdominal pain. MRI showed multiple areas of stroke in the callosal body, splenium and left gyrus, left globus pallidus. This is concerning for septic emboli given her fevers and +bld cx.   Multiple strokes involving corpus callosum body, splenium, left basal ganglia likely embolic, possibly septic emboli versus paradoxical embolism.  Resultant  AMS, right side weak, left gaze  Code Stroke CT Head -   Not done  CT head - L cerebellar hypodensity  MRI head- multiple areas of stroke in the callosal body, splenium and left gyrus, left globus pallidus.  MRA head   CTA H&N   CT Perfusion  Carotid Doppler -  2D Echo - 60% EF,  LA dilation, ?L->R PFO  [employer] Virus 2  neg  LDL - 97    Component Value Date/Time   LDLCALC 97 11/07/2018 1139   LDLCALC 91 08/30/2018 1056     HgbA1c - 8.1  UDS not done  VTE prophylaxis - SCDs Diet  Diet Order            Diet NPO time specified  Diet effective now               none prior to admission, now on ASA  unalbe to be counseled to be compliant with her antithrombotic medications at this time  Ongoing aggressive stroke risk factor management  Therapy recommendations:  pending  Disposition:  Pending  Hypertension  Home BP meds: Norvasc, Zestril  Current BP meds: none  Stable, currently running low . Permissive hypertension (OK if < 220/120) but gradually normalize in 5-7 days . Long-term BP goal normotensive  Hyperlipidemia  Home Lipid lowering medication: Lipitor 40  LDL 97, goal < 70  Current lipid lowering medication: Lipitor 40 mg daily  Continue statin at discharge  Diabetes  Home diabetic meds: Amaryl, Janumet  Current diabetic meds: SSI  HgbA1c 8.1, goal < 7.0 Recent Labs    11/07/18 1146 11/07/18 1332 11/07/18 1445  GLUCAP 197* 178* 160*     Other Stroke Risk Factors  Advanced age  Cigarette smoker; when able will be advised to stop smoking   Other  Active Problems  Pneumonia  E.Coli Bacteremia (+blood cx)- abx started, will need ID consulted  Fevers- normalizing  AKI- monitoring labs  Acute respiratory failure- intubated, vent support  Encephalopathy- AMS multi-factoral d/t stroke, infection, toxic-metabolic. Improving slowly  Hospital day # 6  Desiree Metzger-Cihelka, ARNP-C, ANVP-BC Pager: [phone] I have personally obtained history,examined this patient, reviewed notes, independently viewed imaging studies, participated in medical decision making and plan of care.ROS completed by me personally and pertinent positives fully documented  I have made any additions or clarifications directly to  the above note.  She presented with pneumonia and respiratory difficulty and was found to be septic with E. coli bacteremia and MRI shows multiple cortical and subcortical embolic infarcts need to rule out bacterial endocarditis and 2D echo also shows a possible PFO hence paradoxical embolism may also be a possibility.  Recommend check lower lower extremity venous Dopplers for DVT and TEE for vegetations and cardiac source of embolism.  Long discussion with patient's daughter and son-in-law over the phone as well will with critical care MD and answered questions This patient is critically ill and at significant risk of neurological worsening, death and care requires constant monitoring of vital signs, hemodynamics,respiratory and cardiac monitoring, extensive review of multiple databases, frequent neurological assessment, discussion with family, other specialists and medical decision making of high complexity.I have made any additions or clarifications directly to the above note.This critical care time does not reflect procedure time, or teaching time or supervisory time of PA/NP/Med Resident etc but could involve care discussion time.  I spent 40 minutes of neurocritical care time  in the care of  this patient.       Antony Contras, MD Medical Director [facility] Pager: [phone] 11/07/2018 5:38 PM To contact Stroke Continuity provider, please refer to [email or web address] After hours, contact General Neurology

## 2018-11-07 NOTE — Progress Notes (Signed)
NAME:  Aimee Parker, MRN:  [id number], DOB:  20-Dec-1950, LOS: 6 ADMISSION DATE:  11/01/2018, CONSULTATION DATE: 11/04/2018 REFERRING MD: Dr. Marva Panda, CHIEF COMPLAINT: Sepsis  Brief History   68 y.o. F with PMH of DM and HTN who presents with 2-3 days of fever, malaise and fatigue.  She was found to have probable bilateral PNA and UTI in the ED with lactic acidosis and required Bipap, therefore PCCM was consulted.  Developed worsening respiratory distress with increased work of breathing and accessory muscle use requiring endotracheal intubation 11/02/2018     History of present illness   68 y.o. F coming from home with PMH of DM and HTN who c/o fever, malaise, fatigue and some abdominal pain. She has been in her normal state of health until onset of symptoms and initially presented to urgent care, then to the ED and was initially febrile to 103F with tachycardia and eventually oxygen sats dropped in the 80%'s and pt was placed on Bipap.  In the ED, labs were significant for lactic acid of 4.3, WBC 17k, CO2 19 with gap of 15, creatinine 1.67 (baseline ~1.0) troponin 334 and UA significant for leuks and bacteria and +nitrite positive. She received 4L IVF, cefepime and vancomycin.  CT chest notable for bilateral basilar infiltrates and ground glass opacities. She was initially admitted to internal medicine and as BP was down-trending and she was requiring Bipap, PCCM was consulted.  On initial evaluation, pt is awake and responsive and denies any complaints.  Interviewed with translation assistance from family [facility] interpreter on the way).  She denies any chest pain, current abdominal pain, recent URI symptoms, diarrhea or other complaints.  No recent UTI's or hospitalizations.   Past Medical History  Vitamin D deficiency Hypertension Type 2 diabetes  Significant Hospital Events   10/20 Admit to [facility] 10/21 Worsening respiratory distress requiring intubation  Consults:  Neurology  Stroke  Neuro Cardiology   Procedures:  ETT 10/21 >>  A-line 10/21 >> Left IJ CVC 10/21 >>   Significant Diagnostic Tests:  10/20 CT chest >> Patchy airspace areas of consolidation in the posterior lower lungs with ground-glass opacities in the upper lungs   10/20 CT abd/pelvis >> Heterogeneous left renal nephrogram with heterogenous striated appearance consistent with pyelonephritis, moderate left perinephritic edema and mild left ureteral prominence. Wall thickening of splenic flexure of colon likely due to renal inflammation   10/25 CT head>> 10 x 15 mm hypodensity left posterior cerebellum. Extensive low density body and splenium of corpus callosum.  10/25 MRI brain>>Restricted diffusion, swelling and edema within the callosal body and splenium as well as left cingulate gyrus. Additional punctate focus of restricted diffusion and edema within the left globus pallidus. Findings may reflect acute ischemic infarcts. However, toxic/metabolic etiologies should also be considered. Acute left cerebellar infarct. Bilateral mastoid effusions, large on the right. Fluid signal also present within the right eustachian tube.   Micro Data:  10/20 Sars-CoV-2 >> neg 10/20 Blood culture >>  Enterobacteriaceae and Ecoli  10/20 UC  >>multiple species present, suggest recollection, FINAL.  10/21 RVP  >> neg 10/22 Sputum Culture >>  Antimicrobials:  Vancomycin 10/20 >10/21. 10/25>> Cefepime 10/20 Ceftriaxone 10/21>>10/25 Acyclovir 10/25>> Ampicillin 10/25>>   Interim history/subjective:  MRI brain reveals 2 medium sized regions of restricted diffusion, one with associated swelling and edema within the callosal body. MRI C/T spine pending. CTA head/neck pending.   Doing well on PSV 5/5. Following commands this am.   Objective   Blood  pressure (!) 114/57, pulse 90, temperature 98.9 F (37.2 C), temperature source Oral, resp. rate 14, height 5\' 4"  (1.626 m), weight 69.3 kg, SpO2 98 %.    Vent  Mode: CPAP;PSV FiO2 (%):  [40 %] 40 % Set Rate:  [18 bmp] 18 bmp PEEP:  [5 cmH20] 5 cmH20 Pressure Support:  [5 cmH20] 5 cmH20 Plateau Pressure:  [12 cmH20-13 cmH20] 13 cmH20   Intake/Output Summary (Last 24 hours) at 11/07/2018 1014 Last data filed at 11/07/2018 0824 Gross per 24 hour  Intake 4134.7 ml  Output 4200 ml  Net -65.3 ml   Filed Weights   11/05/18 0500 11/06/18 0500 11/07/18 0500  Weight: 66.7 kg 70.2 kg 69.3 kg    Examination: Ambulatory General: Elderly, Well-developed, well nourished. NAD HENT: Normocephalic, PERRL. Moist mucus membranes Neck: No JVD. Trachea midline. No thyromegaly, no lymphadenopathy CV: RRR. S1S2. No MRG. +2 distal pulses Lungs: BBS present, faint rhonchi upper lobes, FNL, symmetrical ABD: +BS x4. SNT/ND. No masses, guarding or rigidity GU: Foley EXT: Waves with both hands to commands. Trace edema b/l upper and lower EXT  Skin: PWD. In tact. No rashes or lesions Neuro: Alert to verbal. Waves with both hands to commands, able to wiggle toes on both feet, unable to hold legs up. Blinks eyes to command    Resolved Hospital Problem list    Assessment & Plan:  Acute hypoxic respiratory failure  Continue ventilator support to prevent eminent deterioration and further organ dysfunction from hypoxemia and hypercarbia.   Patient is at risk for sudden hypoxia, barotrauma and hemodynamic compromise.   Maintain SpO2 greater than or equal to 90%. Head of bed elevated 30 degrees. Plateau pressures less than 30 cm H20.  Follow chest x-ray, ABG prn.   SAT/SBT as tolerated. Bronchial hygiene. RT/bronchodilator protocol.   Encephalopathy-improved this am  -Likely secondary to sepsis and possible CVA  -Neuro and stroke neuro following-CTA head/neck. D/W stroke neuro-D/C MRI for now. No need for LP.  -concern for endocarditis as possible cause of infarct given bacteremia-consult cardiology for TEE (done) -she was started on empiric abx for menigitis  per neuro   Hypertension -Labetalol as needed  Sepsis secondary to E. coli bacteremia Possible meningitis  Pyelonephritis -Continue IV antibiotics-consider ID consult pending findings of TEE -Follow cultures -see above   Metabolic acidosis-resolved  -Trend electrolytes  Acute kidney injury-resolved  -Continue IV hydration --Nephrotoxic's  Thrombocytopenia -Subcu heparin discontinued -Monitor platelets -Likely secondary to sepsis and bacteremia  Electrolyte derangement including Hypokalemia Hypomagnesemia -Replace  -Trend electrolytes  DM with hyperglycemia-pt had metabolic acidosis, with mild hyperglycemia and ketonuria- was started on DKA protocol -transition to SSI Q4 H with lantus 20U QD    Elevated troponin -Demand ischemia   Best practice:  Diet: Continue tube feeds per recs Pain/Anxiety/Delirium protocol (if indicated): Fentanyl drip VAP protocol (if indicated): In place DVT prophylaxis: SCDs, heparin-currently off d/t TCP  GI prophylaxis: PPI Glucose control: transition to SSI with Lantus  Mobility: Bedrest Code Status: Full code Family Communication: Will update daughter at bedside Disposition: ICU  Labs   CBC: Recent Labs  Lab 11/01/18 1535  11/02/18 0352  11/03/18 0520 11/03/18 1205 11/04/18 0449 11/04/18 0841 11/05/18 0459 11/06/18 0432  WBC 17.0*  --  17.1*  --  13.6*  --  19.8*  --  12.8* 12.2*  NEUTROABS 15.5*  --   --   --   --   --  17.0*  --   --  7.6  HGB 11.0*   < >  8.9*   < > 8.2* 8.8* 8.3* 10.2* 8.8* 8.4*  HCT 33.6*   < > 28.2*   < > 25.3* 26.0* 25.4* 30.0* 26.0* 25.2*  MCV 69.3*  --  70.0*  --  68.8*  --  68.6*  --  65.2* 65.5*  PLT 179  --  111*  --  57*  --  48*  --  58* 74*   < > = values in this interval not displayed.    Basic Metabolic Panel: Recent Labs  Lab 11/04/18 1130  11/04/18 1720 11/04/18 2006 11/05/18 0114 11/05/18 0459 11/05/18 1725 11/06/18 0432 11/06/18 2000 11/07/18 0336  NA  --    < >  --  143  143 145  --  140  --  136  K  --    < >  --  3.2* 3.5 3.2*  --  3.3*  --  3.2*  CL  --    < >  --  108 106 107  --  102  --  97*  CO2  --    < >  --  23 25 27   --  28  --  28  GLUCOSE  --    < >  --  183* 195* 163*  --  185*  --  175*  BUN  --    < >  --  30* 29* 27*  --  17  --  17  CREATININE  --    < >  --  1.40* 1.28* 1.16*  --  0.79  --  0.64  CALCIUM  --    < >  --  8.4* 8.7* 8.6*  --  8.0*  --  7.9*  MG 2.2  --  2.0  --   --  1.6* 1.4* 1.4* 2.3 2.0  PHOS 2.2*  --  1.5*  --   --  1.6* 2.8  --   --   --    < > = values in this interval not displayed.   GFR: Estimated Creatinine Clearance: 64.3 mL/min (by C-G formula based on SCr of 0.64 mg/dL). Recent Labs  Lab 11/02/18 0620 11/02/18 1815 11/02/18 2158 11/03/18 0520 11/04/18 0449 11/05/18 0459 11/06/18 0432  WBC  --   --   --  13.6* 19.8* 12.8* 12.2*  LATICACIDVEN 5.2* 4.1* 3.7* 2.6*  --   --   --     Liver Function Tests: Recent Labs  Lab 11/01/18 1535  AST 37  ALT 33  ALKPHOS 68  BILITOT 1.0  PROT 7.6  ALBUMIN 3.6   No results for input(s): LIPASE, AMYLASE in the last 168 hours. Recent Labs  Lab 11/06/18 1100  AMMONIA 19    ABG    Component Value Date/Time   PHART 7.334 (L) 11/04/2018 0841   PCO2ART 27.1 (L) 11/04/2018 0841   PO2ART 79.0 (L) 11/04/2018 0841   HCO3 14.4 (L) 11/04/2018 0841   TCO2 15 (L) 11/04/2018 0841   ACIDBASEDEF 10.0 (H) 11/04/2018 0841   O2SAT 95.0 11/04/2018 0841       CBG: Recent Labs  Lab 11/07/18 0442 11/07/18 0544 11/07/18 0644 11/07/18 0811 11/07/18 1004  GLUCAP 156* 136* 151* 137* 68*    The patient is critically ill with respiratory failure, sepsis and encephalopathy. She requires ICU for high complexity decision making, titration of high alert medications, ventilator management, titration of oxygen and interpretation of advanced monitoring.    I personally spent 60 minutes providing critical care services including  personally reviewing test results,  discussing care with nursing staff/other physicians and completing orders pertaining to this patient.  Time was exclusive to the patient and does not include time spent teaching or in procedures.  Voice recognition software was used in the production of this record.  Errors in interpretation may have been inadvertently missed during review.  Karin Lieu, MSN, AGACNP  [facility]

## 2018-11-07 NOTE — Progress Notes (Signed)
[facility] [place] Progress Note Patient Name: Aimee Parker DOB: 03/08/1950 MRN: [id number]   Date of Service  11/07/2018  HPI/Events of Note  Wheezing   eICU Interventions  Will order: 1. Albuterol 2.5 mg via neb Q 3 hours PRN wheezing or SOB.      Intervention Category Major Interventions: Other:  Willi Borowiak Cornelia Copa 11/07/2018, 9:08 PM

## 2018-11-07 NOTE — Progress Notes (Signed)
[place] Progress Note Patient Name: Aimee Parker DOB: [birth year] MRN: [id number]   Date of Service  11/07/2018  HPI/Events of Note  Hyperglycemia - Patient starting tube feeds. Blood glucose = 175. Request to D/C D5 0.45 NaCl infusion.   eICU Interventions  Will D/C D5 0.45 NaCl IV infusion.      Intervention Category Major Interventions: Hyperglycemia - active titration of insulin therapy  Lysle Dingwall 11/07/2018, 8:29 PM

## 2018-11-07 NOTE — Progress Notes (Signed)
.  [facility] ADULT ICU REPLACEMENT PROTOCOL FOR AM LAB REPLACEMENT ONLY  The patient does apply for the [facility] Adult ICU Electrolyte Replacment Protocol based on the criteria listed below:   1. Is GFR >/= 40 ml/min? Yes.    Patient's GFR today is >60 2. Is urine output >/= 0.5 ml/kg/hr for the last 6 hours? Yes.   Patient's UOP is 2.3 ml/kg/hr 3. Is BUN < 60 mg/dL? Yes.    Patient's BUN today is 17 4. Abnormal electrolyte(s): K 3.2 5. Ordered repletion with: protocol 6. If a panic level lab has been reported, has the CCM MD in charge been notified? Yes.  .   Physician:  Dr.Sommer  Carlisle Beers 11/07/2018 4:52 AM

## 2018-11-07 NOTE — Consult Note (Signed)
°  [facility]  Total days of antibiotics 7 °        °      °Reason for Consult: gram negative endocarditis    °Referring Physician: olalere ° °Principal Problem: °  Sepsis (HCC) °Active Problems: °  Acute respiratory failure (HCC) °  Pneumonia °  Pyelonephritis °  Endotracheal tube present °  Septic shock (HCC) ° ° ° °HPI: Aimee Parker is a 68 y.o. female with hx of T2DM, HTN who was admitted on 10/20 for 2 day hx of fever, fatigue and shortness of breath, abdominal discomfort. On admit, she was febrile to 103F, AMS, tachycardic, elevated RR requiring bipap. Labs revealed WBC of 17K, with ABG metabolic acidosis. Admitted to ICU for sepsis. Infectious work up revealed +UA, e.coli bacteremia. Imaging showed possible L pyelonephritis ? Possible renal infarct. Chest CT shows patchy infiltrate. She subsequently had an embolic stroke on HD #5. Constellation of symptoms concerning for endocarditis. Her mental status has been decreased in the last 2 days but slowly improving. Her abtx were broadened for meningitis coverage due to concern for worsening encephalopathy and neck pain. She was evaluated by neurology for stroke work up. ° °Past Medical History:  °Diagnosis Date  °• Diabetes mellitus without complication (HCC)   °• Hypertension   °• Vitamin D deficiency   ° ° °Allergies: No Known Allergies ° °MEDICATIONS: °• chlorhexidine gluconate (MEDLINE KIT)  15 mL Mouth Rinse BID  °• Chlorhexidine Gluconate Cloth  6 each Topical Daily  °• feeding supplement (PRO-STAT SUGAR FREE 64)  30 mL Per Tube BID  °• insulin aspart  0-15 Units Subcutaneous Q4H  °• insulin glargine  20 Units Subcutaneous Q24H  °• mouth rinse  15 mL Mouth Rinse 10 times per day  °• pantoprazole (PROTONIX) IV  40 mg Intravenous Daily  °• sodium chloride flush  3 mL Intravenous Q12H  ° ° °Social History  ° °Tobacco Use  °• Smoking status: Never Smoker  °• Smokeless tobacco: Never Used  °Substance Use Topics  °• Alcohol use: No   °• Drug use: No  ° ° °Family History  °Problem Relation Age of Onset  °• Hypertension Mother   °• Hypertension Father   ° ° °Review of Systems -  °Unable to obtain since intubated ° °OBJECTIVE: °Temp:  [98.5 °F (36.9 °C)-99.3 °F (37.4 °C)] 98.9 °F (37.2 °C) (10/26 0800) °Pulse Rate:  [81-100] 96 (10/26 1536) °Resp:  [11-20] 14 (10/26 1536) °BP: (107-143)/(53-84) 126/56 (10/26 1536) °SpO2:  [95 %-100 %] 100 % (10/26 1536) °Arterial Line BP: (113-194)/(49-75) 131/49 (10/26 1500) °FiO2 (%):  [40 %] 40 % (10/26 1536) °Weight:  [69.3 kg] 69.3 kg (10/26 0500) °Physical Exam  °Constitutional:  Intubated. appears well-developed and well-nourished. No distress.  °HENT: [place]/AT, PERRLA, no scleral icterus right gaze preference, intubated °Mouth/Throat: OETT in place °Cardiovascular: Normal rate, regular rhythm and normal heart sounds. Exam reveals no gallop and no friction rub.  °No murmur heard.  °Pulmonary/Chest: Effort normal and breath sounds normal. No respiratory distress.  has no wheezes.  °Neck = supple, no nuchal rigidity °Abdominal: Soft. Bowel sounds are decreased.  mild distension. There is no tenderness.  °Lymphadenopathy: no cervical adenopathy. No axillary adenopathy °Neurological: moves hands to verbal command; no spontaneous movement of legs, retracts to pain °Skin: Skin is warm and dry. No rash noted. No erythema.  °Psychiatric: a normal mood and affect.  behavior is normal.  ° ° °LABS: °Results for orders placed   or performed during the hospital encounter of 11/01/18 (from the past 48 hour(s))  °Glucose, capillary     Status: Abnormal  ° Collection Time: 11/05/18  5:24 PM  °Result Value Ref Range  ° Glucose-Capillary 241 (H) 70 - 99 mg/dL  °Magnesium     Status: Abnormal  ° Collection Time: 11/05/18  5:25 PM  °Result Value Ref Range  ° Magnesium 1.4 (L) 1.7 - 2.4 mg/dL  °  Comment: Performed at [facility], [address] [place], [place] [address]  °Phosphorus     Status: None  ° Collection Time:  11/05/18  5:25 PM  °Result Value Ref Range  ° Phosphorus 2.8 2.5 - 4.6 mg/dL  °  Comment: Performed at [facility], [address] [place], [place] [address]  °Glucose, capillary     Status: Abnormal  ° Collection Time: 11/05/18  6:45 PM  °Result Value Ref Range  ° Glucose-Capillary 197 (H) 70 - 99 mg/dL  °Glucose, capillary     Status: Abnormal  ° Collection Time: 11/05/18  8:01 PM  °Result Value Ref Range  ° Glucose-Capillary 181 (H) 70 - 99 mg/dL  °Glucose, capillary     Status: Abnormal  ° Collection Time: 11/05/18  9:13 PM  °Result Value Ref Range  ° Glucose-Capillary 153 (H) 70 - 99 mg/dL  °Glucose, capillary     Status: Abnormal  ° Collection Time: 11/05/18 10:17 PM  °Result Value Ref Range  ° Glucose-Capillary 112 (H) 70 - 99 mg/dL  °Glucose, capillary     Status: Abnormal  ° Collection Time: 11/05/18 11:17 PM  °Result Value Ref Range  ° Glucose-Capillary 117 (H) 70 - 99 mg/dL  °Glucose, capillary     Status: Abnormal  ° Collection Time: 11/06/18 12:23 AM  °Result Value Ref Range  ° Glucose-Capillary 125 (H) 70 - 99 mg/dL  °Glucose, capillary     Status: Abnormal  ° Collection Time: 11/06/18  1:25 AM  °Result Value Ref Range  ° Glucose-Capillary 133 (H) 70 - 99 mg/dL  °Glucose, capillary     Status: Abnormal  ° Collection Time: 11/06/18  2:24 AM  °Result Value Ref Range  ° Glucose-Capillary 157 (H) 70 - 99 mg/dL  °Glucose, capillary     Status: Abnormal  ° Collection Time: 11/06/18  3:26 AM  °Result Value Ref Range  ° Glucose-Capillary 175 (H) 70 - 99 mg/dL  °Glucose, capillary     Status: Abnormal  ° Collection Time: 11/06/18  4:29 AM  °Result Value Ref Range  ° Glucose-Capillary 174 (H) 70 - 99 mg/dL  °Basic metabolic panel     Status: Abnormal  ° Collection Time: 11/06/18  4:32 AM  °Result Value Ref Range  ° Sodium 140 135 - 145 mmol/L  ° Potassium 3.3 (L) 3.5 - 5.1 mmol/L  ° Chloride 102 98 - 111 mmol/L  ° CO2 28 22 - 32 mmol/L  ° Glucose, Bld 185 (H) 70 - 99 mg/dL  ° BUN 17 8 - 23 mg/dL  °  Creatinine, Ser 0.79 0.44 - 1.00 mg/dL  ° Calcium 8.0 (L) 8.9 - 10.3 mg/dL  ° GFR calc non Af Amer >60 >60 mL/min  ° GFR calc Af Amer >60 >60 mL/min  ° Anion gap 10 5 - 15  °  Comment: Performed at [facility], [address] [place], [place] [address]  °CBC with Differential/Platelet     Status: Abnormal  ° Collection Time: 11/06/18  4:32 AM  °  Result Value Ref Range  ° WBC 12.2 (H) 4.0 - 10.5 K/uL  ° RBC 3.85 (L) 3.87 - 5.11 MIL/uL  ° Hemoglobin 8.4 (L) 12.0 - 15.0 g/dL  °  Comment: Reticulocyte Hemoglobin testing °may be clinically indicated, °consider ordering this additional °test [id number] °  ° HCT 25.2 (L) 36.0 - 46.0 %  ° MCV 65.5 (L) 80.0 - 100.0 fL  ° MCH 21.8 (L) 26.0 - 34.0 pg  ° MCHC 33.3 30.0 - 36.0 g/dL  ° RDW 14.6 11.5 - 15.5 %  ° Platelets 74 (L) 150 - 400 K/uL  °  Comment: Immature Platelet Fraction may be °clinically indicated, consider °ordering this additional test °[id number] °CONSISTENT WITH PREVIOUS RESULT °  ° nRBC 0.2 0.0 - 0.2 %  ° Neutrophils Relative % 62 %  ° Neutro Abs 7.6 1.7 - 7.7 K/uL  ° Lymphocytes Relative 23 %  ° Lymphs Abs 2.8 0.7 - 4.0 K/uL  ° Monocytes Relative 9 %  ° Monocytes Absolute 1.1 (H) 0.1 - 1.0 K/uL  ° Eosinophils Relative 3 %  ° Eosinophils Absolute 0.3 0.0 - 0.5 K/uL  ° Basophils Relative 0 %  ° Basophils Absolute 0.0 0.0 - 0.1 K/uL  ° Immature Granulocytes 3 %  ° Abs Immature Granulocytes 0.34 (H) 0.00 - 0.07 K/uL  °  Comment: Performed at [facility], [address] [place], [place] [address]  °Magnesium     Status: Abnormal  ° Collection Time: 11/06/18  4:32 AM  °Result Value Ref Range  ° Magnesium 1.4 (L) 1.7 - 2.4 mg/dL  °  Comment: Performed at [facility], [address] [place], [place] [address]  °Glucose, capillary     Status: Abnormal  ° Collection Time: 11/06/18  5:30 AM  °Result Value Ref Range  ° Glucose-Capillary 181 (H) 70 - 99 mg/dL  °Glucose, capillary     Status: Abnormal  ° Collection Time: 11/06/18  6:29 AM  °Result Value  Ref Range  ° Glucose-Capillary 163 (H) 70 - 99 mg/dL  °Glucose, capillary     Status: Abnormal  ° Collection Time: 11/06/18  7:09 AM  °Result Value Ref Range  ° Glucose-Capillary 169 (H) 70 - 99 mg/dL  °Glucose, capillary     Status: Abnormal  ° Collection Time: 11/06/18  8:00 AM  °Result Value Ref Range  ° Glucose-Capillary 173 (H) 70 - 99 mg/dL  °Glucose, capillary     Status: Abnormal  ° Collection Time: 11/06/18  9:08 AM  °Result Value Ref Range  ° Glucose-Capillary 164 (H) 70 - 99 mg/dL  °Glucose, capillary     Status: Abnormal  ° Collection Time: 11/06/18 10:29 AM  °Result Value Ref Range  ° Glucose-Capillary 143 (H) 70 - 99 mg/dL  °Ammonia     Status: None  ° Collection Time: 11/06/18 11:00 AM  °Result Value Ref Range  ° Ammonia 19 9 - 35 umol/L  °  Comment: Performed at  Hospital Lab, [address] , [place] [address]  °Glucose, capillary     Status: Abnormal  ° Collection Time: 11/06/18 12:02 PM  °Result Value Ref Range  ° Glucose-Capillary 149 (H) 70 - 99 mg/dL  °Glucose, capillary     Status: Abnormal  ° Collection Time: 11/06/18  4:35 PM  °Result Value Ref Range  ° Glucose-Capillary 214 (H) 70 - 99 mg/dL  °Glucose, capillary     Status: Abnormal  ° Collection Time: 11/06/18  5:54 PM  °Result   Result Value Ref Range   Glucose-Capillary 212 (H) 70 - 99 mg/dL  Glucose, capillary     Status: Abnormal   Collection Time: 11/06/18  6:59 PM  Result Value Ref Range   Glucose-Capillary 200 (H) 70 - 99 mg/dL  Magnesium today at 2000     Status: None   Collection Time: 11/06/18  8:00 PM  Result Value Ref Range   Magnesium 2.3 1.7 - 2.4 mg/dL    Comment: Performed at [facility], [place] [address], [place], [place] [address]  Glucose, capillary     Status: Abnormal   Collection Time: 11/06/18  8:08 PM  Result Value Ref Range   Glucose-Capillary 216 (H) 70 - 99 mg/dL  Glucose, capillary     Status: Abnormal   Collection Time: 11/06/18  9:07 PM  Result Value Ref Range   Glucose-Capillary 207  (H) 70 - 99 mg/dL  Glucose, capillary     Status: Abnormal   Collection Time: 11/06/18 10:08 PM  Result Value Ref Range   Glucose-Capillary 214 (H) 70 - 99 mg/dL  Glucose, capillary     Status: Abnormal   Collection Time: 11/06/18 11:12 PM  Result Value Ref Range   Glucose-Capillary 209 (H) 70 - 99 mg/dL  Glucose, capillary     Status: Abnormal   Collection Time: 11/07/18 12:17 AM  Result Value Ref Range   Glucose-Capillary 183 (H) 70 - 99 mg/dL  Glucose, capillary     Status: Abnormal   Collection Time: 11/07/18  1:25 AM  Result Value Ref Range   Glucose-Capillary 168 (H) 70 - 99 mg/dL  Glucose, capillary     Status: Abnormal   Collection Time: 11/07/18  2:28 AM  Result Value Ref Range   Glucose-Capillary 145 (H) 70 - 99 mg/dL  Glucose, capillary     Status: Abnormal   Collection Time: 11/07/18  3:30 AM  Result Value Ref Range   Glucose-Capillary 158 (H) 70 - 99 mg/dL  BMET in AM     Status: Abnormal   Collection Time: 11/07/18  3:36 AM  Result Value Ref Range   Sodium 136 135 - 145 mmol/L   Potassium 3.2 (L) 3.5 - 5.1 mmol/L   Chloride 97 (L) 98 - 111 mmol/L   CO2 28 22 - 32 mmol/L   Glucose, Bld 175 (H) 70 - 99 mg/dL   BUN 17 8 - 23 mg/dL   Creatinine, Ser 0.64 0.44 - 1.00 mg/dL   Calcium 7.9 (L) 8.9 - 10.3 mg/dL   GFR calc non Af Amer >60 >60 mL/min   GFR calc Af Amer >60 >60 mL/min   Anion gap 11 5 - 15    Comment: Performed at [facility], [place] [address] [place], [place] [address]  Magnesium in AM     Status: None   Collection Time: 11/07/18  3:36 AM  Result Value Ref Range   Magnesium 2.0 1.7 - 2.4 mg/dL    Comment: Performed at [facility] [address] [place], [place] [address]  Glucose, capillary     Status: Abnormal   Collection Time: 11/07/18  4:42 AM  Result Value Ref Range   Glucose-Capillary 156 (H) 70 - 99 mg/dL  Glucose, capillary     Status: Abnormal   Collection Time: 11/07/18  5:44 AM  Result Value Ref Range    Glucose-Capillary 136 (H) 70 - 99 mg/dL  Glucose, capillary     Status: Abnormal   Collection Time:  11/07/18  6:44 AM  Result Value Ref Range   Glucose-Capillary 151 (H) 70 - 99 mg/dL  Glucose, capillary     Status: Abnormal   Collection Time: 11/07/18  8:11 AM  Result Value Ref Range   Glucose-Capillary 137 (H) 70 - 99 mg/dL  Glucose, capillary     Status: Abnormal   Collection Time: 11/07/18 10:04 AM  Result Value Ref Range   Glucose-Capillary 68 (L) 70 - 99 mg/dL   Comment 1 Repeat Test   Glucose, capillary     Status: Abnormal   Collection Time: 11/07/18 10:15 AM  Result Value Ref Range   Glucose-Capillary 142 (H) 70 - 99 mg/dL  Lipid panel     Status: Abnormal   Collection Time: 11/07/18 11:39 AM  Result Value Ref Range   Cholesterol 141 0 - 200 mg/dL   Triglycerides 132 <150 mg/dL   HDL 18 (L) >40 mg/dL   Total CHOL/HDL Ratio 7.8 RATIO   VLDL 26 0 - 40 mg/dL   LDL Cholesterol 97 0 - 99 mg/dL    Comment:        Total Cholesterol/HDL:CHD Risk Coronary Heart Disease Risk Table                     Men   Women  1/2 Average Risk   3.4   3.3  Average Risk       5.0   4.4  2 X Average Risk   9.6   7.1  3 X Average Risk  23.4   11.0        Use the calculated Patient Ratio above and the CHD Risk Table to determine the patient's CHD Risk.        ATP III CLASSIFICATION (LDL):  <100     mg/dL   Optimal  100-129  mg/dL   Near or Above                    Optimal  130-159  mg/dL   Borderline  160-189  mg/dL   High  >190     mg/dL   Very High Performed at [facility] [address], [place], [place] [address]   CBC     Status: Abnormal   Collection Time: 11/07/18 11:39 AM  Result Value Ref Range   WBC 14.0 (H) 4.0 - 10.5 K/uL   RBC 4.01 3.87 - 5.11 MIL/uL   Hemoglobin 8.7 (L) 12.0 - 15.0 g/dL    Comment: Reticulocyte Hemoglobin testing may be clinically indicated, consider ordering this additional test [id number]    HCT 26.6 (L) 36.0 - 46.0 %   MCV 66.3 (L)  80.0 - 100.0 fL   MCH 21.7 (L) 26.0 - 34.0 pg   MCHC 32.7 30.0 - 36.0 g/dL   RDW 14.2 11.5 - 15.5 %   Platelets 123 (L) 150 - 400 K/uL    Comment: REPEATED TO VERIFY   nRBC 0.2 0.0 - 0.2 %    Comment: Performed at [facility], [place] [address], [place], [place] [address]  Protime-INR     Status: None   Collection Time: 11/07/18 11:39 AM  Result Value Ref Range   Prothrombin Time 13.4 11.4 - 15.2 seconds   INR 1.0 0.8 - 1.2    Comment: (NOTE) INR goal varies based on device and disease states. Performed at [facility], [place] [address] [place], [place] [address]   Glucose, capillary  Status: Abnormal   Collection Time: 11/07/18 11:46 AM  Result Value Ref Range   Glucose-Capillary 197 (H) 70 - 99 mg/dL  Glucose, capillary     Status: Abnormal   Collection Time: 11/07/18  1:32 PM  Result Value Ref Range   Glucose-Capillary 178 (H) 70 - 99 mg/dL  Glucose, capillary     Status: Abnormal   Collection Time: 11/07/18  2:45 PM  Result Value Ref Range   Glucose-Capillary 160 (H) 70 - 99 mg/dL    MICRO:  10/20 +ecoli blood cx - pan sensitive IMAGING: Ct Head Wo Contrast  Result Date: 11/06/2018 CLINICAL DATA:  Encephalopathy EXAM: CT HEAD WITHOUT CONTRAST TECHNIQUE: Contiguous axial images were obtained from the base of the skull through the vertex without intravenous contrast. COMPARISON:  None. FINDINGS: Brain: Ventricle size normal.  No acute hemorrhage. Extensive hypodensity throughout the body and splenium of the corpus callosum. Anterior corpus callosum spared. No associated acute hemorrhage. 10 x 15 mm hypodensity left posterior cerebellum could be acute or chronic. No priors. Vascular: Negative for hyperdense vessel Skull: Negative Sinuses/Orbits: Mucosal edema paranasal sinuses. Negative orbital lesion. Bilateral cataract surgery. Other: None IMPRESSION: 10 x 15 mm hypodensity left posterior cerebellum. This could represent infarct of either acute or chronic  duration. Mass lesion considered less likely. Extensive low density body and splenium of corpus callosum. Differential includes necrosis related to alcohol abuse or B vitamin deficiency. This is more extensive than chronic ischemia. No definite tumor. Differential includes toxic metabolic encephalopathy and osmotic demyelinization. Recommend MRI brain without and with contrast for further evaluation. These results were called by telephone at the time of interpretation on 11/06/2018 at 12:14 pm to provider [facility] , who verbally acknowledged these results. Electronically Signed   By: Franchot Gallo M.D.   On: 11/06/2018 12:14   Mr Jeri Cos CZ Contrast  Result Date: 11/06/2018 CLINICAL DATA:  Abnormal CT head with lesion of corpus callosum EXAM: MRI HEAD WITHOUT AND WITH CONTRAST TECHNIQUE: Multiplanar, multiecho pulse sequences of the brain and surrounding structures were obtained without and with intravenous contrast. CONTRAST:  96m GADAVIST GADOBUTROL 1 MMOL/ML IV SOLN COMPARISON:  Noncontrast head CT 11/06/2018 FINDINGS: Brain: There is extensive restricted diffusion with corresponding swelling and T2/FLAIR hyperintensity within the callosal body eccentric to the left, also extending posteriorly to involve the midline callosal splenium. There is also involvement of the left cingulate gyrus. No corresponding abnormal enhancement. Additional punctate focus of restricted diffuse and T2/FLAIR hyperintensity within the left globus pallidus (series 5, image 78). Acute infarct within the left cerebellum measuring 2.0 x 1.4 cm in transaxial dimensions. Corresponding T2/FLAIR hyperintensity at this site. No significant white matter disease for age. Additional small T2 hyperintense foci within the bilateral basal ganglia are favored to reflect prominent perivascular spaces. Cerebral volume is normal for age. Partially empty sella turcica. No abnormal intracranial enhancement elsewhere. Vascular: Flow voids  maintained within the proximal large arterial vessels. Expected enhancement within the dural venous sinuses. Skull and upper cervical spine: No focal marrow lesion. C3-C4 posterior disc osteophyte contributing to mild spinal canal stenosis. Sinuses/Orbits: Visualized orbits demonstrate no acute abnormality. Mild scattered paranasal sinus mucosal thickening. Bilateral mastoid effusions, large on the right. Fluid signal is also present within the right eustachian tube. These results were called by telephone at the time of interpretation on 11/06/2018 at 4:37 pm to provider ASherrilyn Rist, who verbally acknowledged these results. IMPRESSION: 1. Restricted diffusion, swelling and edema within the callosal body and splenium  as well as left cingulate gyrus. Additional punctate focus of restricted diffusion and edema within the left globus pallidus. Findings may reflect acute ischemic infarcts. However, toxic/metabolic etiologies should also be considered. 2. Acute left cerebellar infarct. 3. Bilateral mastoid effusions, large on the right. Fluid signal also present within the right eustachian tube. Electronically Signed   By: Kyle  Golden DO   On: 11/06/2018 16:38  ° ° °HISTORICAL MICRO/IMAGING ° °Assessment/Plan:  68yo F who originally presented what looks like ecoli pyelonephritis with secondary bacteremia but developed embolic stroke concerning for gram negative endocarditis. Has imaging on admit that suggests maybe that she had endocarditis by patchy infiltrate on chest CT and possible renal infarct. Unusual to see gram negative endocarditis in non-IVDU but can occur following urinary source like pyelonephritis and prostatic abscesses °- recommend to narrow abtx to ceftriaxone 2gm IV Q12h °- will recommend to get TEE for evaluation of native valve °- will plan to do 6 wk for now ° °

## 2018-11-08 ENCOUNTER — Inpatient Hospital Stay (HOSPITAL_COMMUNITY): Payer: Medicare Other

## 2018-11-08 ENCOUNTER — Other Ambulatory Visit (HOSPITAL_COMMUNITY): Payer: Medicare Other

## 2018-11-08 DIAGNOSIS — R7881 Bacteremia: Secondary | ICD-10-CM | POA: Diagnosis present

## 2018-11-08 DIAGNOSIS — I6340 Cerebral infarction due to embolism of unspecified cerebral artery: Secondary | ICD-10-CM | POA: Diagnosis not present

## 2018-11-08 DIAGNOSIS — B9620 Unspecified Escherichia coli [E. coli] as the cause of diseases classified elsewhere: Secondary | ICD-10-CM | POA: Diagnosis not present

## 2018-11-08 DIAGNOSIS — M7989 Other specified soft tissue disorders: Secondary | ICD-10-CM | POA: Diagnosis not present

## 2018-11-08 DIAGNOSIS — R011 Cardiac murmur, unspecified: Secondary | ICD-10-CM | POA: Diagnosis not present

## 2018-11-08 DIAGNOSIS — J9601 Acute respiratory failure with hypoxia: Secondary | ICD-10-CM | POA: Diagnosis not present

## 2018-11-08 LAB — GLUCOSE, CAPILLARY
Glucose-Capillary: 193 mg/dL — ABNORMAL HIGH (ref 70–99)
Glucose-Capillary: 196 mg/dL — ABNORMAL HIGH (ref 70–99)
Glucose-Capillary: 197 mg/dL — ABNORMAL HIGH (ref 70–99)
Glucose-Capillary: 202 mg/dL — ABNORMAL HIGH (ref 70–99)
Glucose-Capillary: 219 mg/dL — ABNORMAL HIGH (ref 70–99)
Glucose-Capillary: 221 mg/dL — ABNORMAL HIGH (ref 70–99)
Glucose-Capillary: 227 mg/dL — ABNORMAL HIGH (ref 70–99)

## 2018-11-08 LAB — RENAL FUNCTION PANEL
Albumin: 1.7 g/dL — ABNORMAL LOW (ref 3.5–5.0)
Anion gap: 7 (ref 5–15)
BUN: 16 mg/dL (ref 8–23)
CO2: 28 mmol/L (ref 22–32)
Calcium: 7.6 mg/dL — ABNORMAL LOW (ref 8.9–10.3)
Chloride: 102 mmol/L (ref 98–111)
Creatinine, Ser: 0.63 mg/dL (ref 0.44–1.00)
GFR calc Af Amer: >60 mL/min (ref >60)
GFR calc non Af Amer: >60 mL/min (ref >60)
Glucose, Bld: 204 mg/dL — ABNORMAL HIGH (ref 70–99)
Phosphorus: 3.8 mg/dL (ref 2.5–4.6)
Potassium: 4.1 mmol/L (ref 3.5–5.1)
Sodium: 137 mmol/L (ref 135–145)

## 2018-11-08 LAB — CBC
HCT: 23.5 % — ABNORMAL LOW (ref 36.0–46.0)
Hemoglobin: 7.8 g/dL — ABNORMAL LOW (ref 12.0–15.0)
MCH: 22.3 pg — ABNORMAL LOW (ref 26.0–34.0)
MCHC: 33.2 g/dL (ref 30.0–36.0)
MCV: 67.1 fL — ABNORMAL LOW (ref 80.0–100.0)
Platelets: 140 10*3/uL — ABNORMAL LOW (ref 150–400)
RBC: 3.5 MIL/uL — ABNORMAL LOW (ref 3.87–5.11)
RDW: 14.1 % (ref 11.5–15.5)
WBC: 12 10*3/uL — ABNORMAL HIGH (ref 4.0–10.5)
nRBC: 0.2 % (ref 0.0–0.2)

## 2018-11-08 LAB — MAGNESIUM: Magnesium: 1.7 mg/dL (ref 1.7–2.4)

## 2018-11-08 MED ORDER — FUROSEMIDE 10 MG/ML IJ SOLN
40.0000 mg | Freq: Once | INTRAMUSCULAR | Status: AC
  Administered 2018-11-08: 40 mg via INTRAVENOUS
  Filled 2018-11-08: qty 4

## 2018-11-08 MED ORDER — SODIUM CHLORIDE 0.9 % IV SOLN
INTRAVENOUS | Status: DC
  Administered 2018-11-08: 02:00:00 via INTRAVENOUS

## 2018-11-08 MED ORDER — FAMOTIDINE 40 MG/5ML PO SUSR
20.0000 mg | Freq: Every day | ORAL | Status: DC
Start: 2018-11-08 — End: 2018-11-11
  Administered 2018-11-08 – 2018-11-10 (×3): 20 mg
  Filled 2018-11-08 (×3): qty 2.5

## 2018-11-08 MED ORDER — HEPARIN SODIUM (PORCINE) 5000 UNIT/ML IJ SOLN
5000.0000 [IU] | Freq: Three times a day (TID) | INTRAMUSCULAR | Status: DC
  Administered 2018-11-08 – 2018-11-09 (×4): 5000 [IU] via SUBCUTANEOUS
  Filled 2018-11-08 (×4): qty 1

## 2018-11-08 MED ORDER — ASPIRIN 81 MG PO CHEW
81.0000 mg | CHEWABLE_TABLET | Freq: Every day | ORAL | Status: DC
  Administered 2018-11-08 – 2018-11-15 (×8): 81 mg via ORAL
  Filled 2018-11-08 (×8): qty 1

## 2018-11-08 NOTE — Progress Notes (Signed)
Inpatient Diabetes Program Recommendations  AACE/ADA: New Consensus Statement on Inpatient Glycemic Control (2015)  Target Ranges:  Prepandial:   less than 140 mg/dL      Peak postprandial:   less than 180 mg/dL (1-2 hours)      Critically ill patients:  140 - 180 mg/dL   Lab Results  Component Value Date   GLUCAP 202 (H) 11/08/2018   HGBA1C 8.1 (H) 11/02/2018    Review of Glycemic Control Results for Aimee Parker, Aimee Parker [facility] (MRN [id number]) as of 11/08/2018 15:35  Ref. Range 11/07/2018 20:28 11/08/2018 00:06 11/08/2018 03:47 11/08/2018 07:57 11/08/2018 11:39  Glucose-Capillary Latest Ref Range: 70 - 99 mg/dL 306 (H) 221 (H) 196 (H) 219 (H) 202 (H)   Inpatient Diabetes Program Recommendations:   Please consider adding: -Novolog 2 units q 4 hrs tube feed coverage (hold if tube feeding held or stopped for any reason)  Thank you, Bethena Roys E. Loray Akard, RN, MSN, CDE  Diabetes Coordinator Inpatient Glycemic Control Team Team Pager [phone] (8am-5pm) 11/08/2018 3:37 PM

## 2018-11-08 NOTE — Progress Notes (Signed)
Bilateral lower extremity venous duplex completed.  11/08/2018 3:03 PM Maudry Mayhew, MHA, RVT, RDCS, RDMS

## 2018-11-08 NOTE — Progress Notes (Signed)
NAME:  Aimee MoatsMai Thi Parker, MRN:  [id number], DOB:  [birth year], LOS: 7 ADMISSION DATE:  11/01/2018, CONSULTATION DATE: 11/04/2018 REFERRING MD: Dr. Mcarthur RossettiAslam, CHIEF COMPLAINT: Sepsis  Brief History   68 y.o. F with PMH of DM and HTN who presents with 2-3 days of fever, malaise and fatigue.  She was found to have probable bilateral PNA and UTI in the ED with lactic acidosis and required Bipap, therefore PCCM was consulted.  Developed worsening respiratory distress with increased work of breathing and accessory muscle use requiring endotracheal intubation 11/02/2018     History of present illness   68 y.o. F coming from home with PMH of DM and HTN who c/o fever, malaise, fatigue and some abdominal pain. She has been in her normal state of health until onset of symptoms and initially presented to urgent care, then to the ED and was initially febrile to 103F with tachycardia and eventually oxygen sats dropped in the 80%'s and pt was placed on Bipap.  In the ED, labs were significant for lactic acid of 4.3, WBC 17k, CO2 19 with gap of 15, creatinine 1.67 (baseline ~1.0) troponin 334 and UA significant for leuks and bacteria and +nitrite positive. She received 4L IVF, cefepime and vancomycin.  CT chest notable for bilateral basilar infiltrates and ground glass opacities. She was initially admitted to internal medicine and as BP was down-trending and she was requiring Bipap, PCCM was consulted.  On initial evaluation, pt is awake and responsive and denies any complaints.  Interviewed with translation assistance from family [facility](MC interpreter on the way).  She denies any chest pain, current abdominal pain, recent URI symptoms, diarrhea or other complaints.  No recent UTI's or hospitalizations.   Past Medical History  Vitamin D deficiency Hypertension Type 2 diabetes  Significant Hospital Events   [facility] Worsening respiratory distress requiring intubation  Consults:  Neurology  Stroke  Neuro Cardiology   Procedures:  ETT 10/21 >>  A-line 10/21 >> Left IJ CVC 10/21 >>   Significant Diagnostic Tests:  10/20 CT chest >> Patchy airspace areas of consolidation in the posterior lower lungs with ground-glass opacities in the upper lungs   10/20 CT abd/pelvis >> Heterogeneous left renal nephrogram with heterogenous striated appearance consistent with pyelonephritis, moderate left perinephritic edema and mild left ureteral prominence. Wall thickening of splenic flexure of colon likely due to renal inflammation   10/25 CT head>> 10 x 15 mm hypodensity left posterior cerebellum. Extensive low density body and splenium of corpus callosum.  10/25 MRI brain>>Restricted diffusion, swelling and edema within the callosal body and splenium as well as left cingulate gyrus. Additional punctate focus of restricted diffusion and edema within the left globus pallidus. Findings may reflect acute ischemic infarcts. However, toxic/metabolic etiologies should also be considered. Acute left cerebellar infarct. Bilateral mastoid effusions, large on the right. Fluid signal also present within the right eustachian tube.   Micro Data:  10/20 Sars-CoV-2 >> neg 10/20 Blood culture >> Ecoli  10/20 UC  >>multiple species present, suggest recollection, FINAL.  10/21 RVP  >> neg 10/22 Sputum Culture >>  Antimicrobials:  Vancomycin 10/20 >10/21. 10/25>> Cefepime 10/20 Ceftriaxone 10/21>>10/25 Acyclovir 10/25>> Ampicillin 10/25>>   Interim history/subjective:  MRI brain reveals 2 medium sized regions of restricted diffusion, one with associated swelling and edema within the callosal body. MRI C/T spine pending. CTA head/neck pending.   Doing well on PSV 5/5. Following commands this am.   Objective   Blood pressure (!) 124/46,  pulse 96, temperature 99.7 F (37.6 C), temperature source Oral, resp. rate 18, height 5\' 4"  (1.626 m), weight 69.3 kg, SpO2 97 %.    Vent Mode: PCV FiO2 (%):  [40  %] 40 % Set Rate:  [18 bmp] 18 bmp PEEP:  [5 cmH20] 5 cmH20 Pressure Support:  [5 cmH20] 5 cmH20 Plateau Pressure:  [12 cmH20-16 cmH20] 12 cmH20   Intake/Output Summary (Last 24 hours) at 11/08/2018 1055 Last data filed at 11/08/2018 1000 Gross per 24 hour  Intake 3371.02 ml  Output 4925 ml  Net -1553.98 ml   Filed Weights   11/05/18 0500 11/06/18 0500 11/07/18 0500  Weight: 66.7 kg 70.2 kg 69.3 kg    Examination: Ambulatory General: Elderly, Well-developed, well nourished. NAD HENT: Normocephalic, PERRL. Moist mucus membranes Neck: No JVD. Trachea midline. No thyromegaly, no lymphadenopathy CV: RRR. S1S2. No MRG. +2 distal pulses Lungs: BBS present, faint rhonchi. No ventilator dyssynchrony.  Strong cough. ABD: +BS x4. SNT/ND. No masses, guarding or rigidity GU: Foley EXT: Waves with both hands to commands. Trace edema b/l upper and lower extremities Skin: PWD. In tact. No rashes or lesions Neuro: Alert to verbal. Waves with both hands to commands, able to wiggle toes on both feet, unable to hold legs up. Blinks eyes to command   Resolved Hospital Problem list    Assessment & Plan:  [place] ill due to acute hypoxic respiratory failure requiring mechanical ventilation Patient able to tolerate SBT yesterday but remains intubated until following TEE (will be performed 10/28) -Diurese to facilitate extubation post TEE.  Was critically ill due to sepsis secondary to E. coli bacteremia now off vasopressors.   Pyelonephritis likely source -Simplify antibiotics to ceftriaxone alone. -Abdominal imaging to rule out abscess.  Neuro imaging consistent with embolic stroke. -TEE to rule out cardiac source of embolism.  Gram-negative endocarditis very unlikely. -Venous Dopplers to rule out source of paradoxical embolism.   Best practice:  Diet: Continue tube feeds  Pain/Anxiety/Delirium protocol (if indicated): Fentanyl drip VAP protocol (if indicated): In place DVT prophylaxis:  SCDs, UFH GI prophylaxis: PPI Glucose control: transition to SSI with Lantus  Mobility: Bedrest Code Status: Full code Family Communication: Daughter has been updated by Dr 11/28. Disposition: ICU  Labs   CBC: Recent Labs  Lab 11/01/18 1535  11/04/18 0449 11/04/18 0841 11/05/18 0459 11/06/18 0432 11/07/18 1139 11/08/18 0209  WBC 17.0*   < > 19.8*  --  12.8* 12.2* 14.0* 12.0*  NEUTROABS 15.5*  --  17.0*  --   --  7.6  --   --   HGB 11.0*   < > 8.3* 10.2* 8.8* 8.4* 8.7* 7.8*  HCT 33.6*   < > 25.4* 30.0* 26.0* 25.2* 26.6* 23.5*  MCV 69.3*   < > 68.6*  --  65.2* 65.5* 66.3* 67.1*  PLT 179   < > 48*  --  58* 74* 123* 140*   < > = values in this interval not displayed.    Basic Metabolic Panel: Recent Labs  Lab 11/04/18 1130  11/04/18 1720  11/05/18 0114 11/05/18 0459 11/05/18 1725 11/06/18 0432 11/06/18 2000 11/07/18 0336 11/08/18 0209  NA  --    < >  --    < > 143 145  --  140  --  136 137  K  --    < >  --    < > 3.5 3.2*  --  3.3*  --  3.2* 4.1  CL  --    < >  --    < >  106 107  --  102  --  97* 102  CO2  --    < >  --    < > 25 27  --  28  --  28 28  GLUCOSE  --    < >  --    < > 195* 163*  --  185*  --  175* 204*  BUN  --    < >  --    < > 29* 27*  --  17  --  17 16  CREATININE  --    < >  --    < > 1.28* 1.16*  --  0.79  --  0.64 0.63  CALCIUM  --    < >  --    < > 8.7* 8.6*  --  8.0*  --  7.9* 7.6*  MG 2.2  --  2.0  --   --  1.6* 1.4* 1.4* 2.3 2.0 1.7  PHOS 2.2*  --  1.5*  --   --  1.6* 2.8  --   --   --  3.8   < > = values in this interval not displayed.   GFR: Estimated Creatinine Clearance: 64.3 mL/min (by C-G formula based on SCr of 0.63 mg/dL). Recent Labs  Lab 11/02/18 0620 11/02/18 1815 11/02/18 2158 11/03/18 0520  11/05/18 0459 11/06/18 0432 11/07/18 1139 11/08/18 0209  WBC  --   --   --  13.6*   < > 12.8* 12.2* 14.0* 12.0*  LATICACIDVEN 5.2* 4.1* 3.7* 2.6*  --   --   --   --   --    < > = values in this interval not displayed.    Liver  Function Tests: Recent Labs  Lab 11/01/18 1535 11/08/18 0209  AST 37  --   ALT 33  --   ALKPHOS 68  --   BILITOT 1.0  --   PROT 7.6  --   ALBUMIN 3.6 1.7*   No results for input(s): LIPASE, AMYLASE in the last 168 hours. Recent Labs  Lab 11/06/18 1100  AMMONIA 19    ABG    Component Value Date/Time   PHART 7.334 (L) 11/04/2018 0841   PCO2ART 27.1 (L) 11/04/2018 0841   PO2ART 79.0 (L) 11/04/2018 0841   HCO3 14.4 (L) 11/04/2018 0841   TCO2 15 (L) 11/04/2018 0841   ACIDBASEDEF 10.0 (H) 11/04/2018 0841   O2SAT 95.0 11/04/2018 0841       CBG: Recent Labs  Lab 11/07/18 1643 11/07/18 2028 11/08/18 0006 11/08/18 0347 11/08/18 0757  GLUCAP 130* 306* 221* 196* 219*    CRITICAL CARE Performed by: Kipp Brood   Total critical care time: 40 minutes  Critical care time was exclusive of separately billable procedures and treating other patients.  Critical care was necessary to treat or prevent imminent or life-threatening deterioration.  Critical care was time spent personally by me on the following activities: development of treatment plan with patient and/or surrogate as well as nursing, discussions with consultants, evaluation of patient's response to treatment, examination of patient, obtaining history from patient or surrogate, ordering and performing treatments and interventions, ordering and review of laboratory studies, ordering and review of radiographic studies, pulse oximetry, re-evaluation of patient's condition and participation in multidisciplinary rounds.  Kipp Brood, MD [facility] ICU Physician [place]  Pager: [phone] Mobile: [phone] After hours: [phone].

## 2018-11-08 NOTE — Progress Notes (Signed)
    [facility] has been requested to perform a transesophageal echocardiogram on Aimee Parker for bacteremia/stroke. Interpreter was used. After careful review of history and examination, the risks and benefits of transesophageal echocardiogram have been explained including risks of esophageal damage, perforation (1:10,000 risk), bleeding, pharyngeal hematoma as well as other potential complications associated with conscious sedation including aspiration, arrhythmia, respiratory failure and death. Alternatives to treatment were discussed, questions were answered. Patient is intubated at present with daughter at bedside. Patient is awake but unable to communicate. Interpreter was used to explain procedure, risks, and benefits, and patient's daughter gave consent to proceed with procedure. Signed consent with witness was obtained as well.   Ruba Outen Ninfa Meeker, PA-C  11/08/2018 4:51 PM

## 2018-11-08 NOTE — Progress Notes (Signed)
[facility] for Infectious Disease  Date of Admission:  11/01/2018      Total days of antibiotics 8  Day 7 ceftriaxone (day 2 of BID dosing).            ASSESSMENT: Aimee Parker is a 68 y.o. female with new onset strokes in the setting of e coli bacteremia. Concern for endocarditis for central source given findings on MRI. Not as common to have gram negative organism but formal evaluation of valves would be best in this setting - she is scheduled for TEE on 10/28 @ 1:30 pm. Clinically she has improved and last fever 10/24; WBC remain about 12K, stable. Continue ceftriaxone BID pending TEE.     PLAN: 1. Continue ceftriaxone 2 gm IV Q12h 2. TEE 10/28 with further recs to follow.    Principal Problem:   Sepsis ([place]) Active Problems:   Pyelonephritis   E coli bacteremia   Acute respiratory failure ([place])   Pneumonia   Endotracheal tube present   Septic shock ([facility])   Cerebral embolism with cerebral infarction   . chlorhexidine gluconate (MEDLINE KIT)  15 mL Mouth Rinse BID  . Chlorhexidine Gluconate Cloth  6 each Topical Daily  . famotidine  20 mg Per Tube Daily  . feeding supplement (PRO-STAT SUGAR FREE 64)  30 mL Per Tube BID  . heparin injection (subcutaneous)  5,000 Units Subcutaneous Q8H  . insulin aspart  0-15 Units Subcutaneous Q4H  . insulin glargine  20 Units Subcutaneous Q24H  . mouth rinse  15 mL Mouth Rinse 10 times per day  . sodium chloride flush  3 mL Intravenous Q12H    SUBJECTIVE: Remains intubated.   Interval additions - Awake and comfortable per nursing team. Following commands with [employer] interpretor. Awaiting TEE. Afebrile.   Review of Systems: Review of Systems  Unable to perform ROS: Intubated    No Known Allergies  OBJECTIVE: Vitals:   11/08/18 1100 11/08/18 1146 11/08/18 1200 11/08/18 1300  BP: 129/63 139/68 131/67 120/63  Pulse: 90 91 90 90  Resp: _0 Temp: 99.6 F (37.6 C)     TempSrc: Oral     SpO2:  97% 96% 98% 97%  Weight:      Height:       Body mass index is 26.22 kg/m.  Physical Exam Vitals signs and nursing note reviewed.  Constitutional:      Appearance: She is well-developed.     Comments: Alert and resting comfortably in bed on ventilator  HENT:     Mouth/Throat:     Mouth: Mucous membranes are moist. No oral lesions.     Dentition: Normal dentition. No dental abscesses.     Pharynx: No oropharyngeal exudate.  Cardiovascular:     Rate and Rhythm: Normal rate and regular rhythm.     Heart sounds: Murmur (systolic murmur 2/6 LSB) present.  Pulmonary:     Effort: Pulmonary effort is normal.     Breath sounds: Normal breath sounds.  Abdominal:     General: There is no distension.     Palpations: Abdomen is soft.     Tenderness: There is no abdominal tenderness.  Lymphadenopathy:     Cervical: No cervical adenopathy.  Skin:    General: Skin is warm and dry.     Findings: No rash.  Neurological:     Mental Status: She is alert.  Psychiatric:     Comments: Intubated  Lab Results Lab Results  Component Value Date   WBC 12.0 (H) 11/08/2018   HGB 7.8 (L) 11/08/2018   HCT 23.5 (L) 11/08/2018   MCV 67.1 (L) 11/08/2018   PLT 140 (L) 11/08/2018    Lab Results  Component Value Date   CREATININE 0.63 11/08/2018   BUN 16 11/08/2018   NA 137 11/08/2018   K 4.1 11/08/2018   CL 102 11/08/2018   CO2 28 11/08/2018    Lab Results  Component Value Date   ALT 33 11/01/2018   AST 37 11/01/2018   ALKPHOS 68 11/01/2018   BILITOT 1.0 11/01/2018     Microbiology: Recent Results (from the past 240 hour(s))  Blood culture (routine x 2)     Status: Abnormal   Collection Time: 11/01/18  3:35 PM   Specimen: BLOOD  Result Value Ref Range Status   Specimen Description BLOOD RIGHT ANTECUBITAL  Final   Special Requests   Final    BOTTLES DRAWN AEROBIC AND ANAEROBIC Blood Culture results may not be optimal due to an excessive volume of blood received in culture  bottles   Culture  Setup Time   Final    GRAM NEGATIVE RODS IN BOTH AEROBIC AND ANAEROBIC BOTTLES    Culture (A)  Final    ESCHERICHIA COLI SUSCEPTIBILITIES PERFORMED ON PREVIOUS CULTURE WITHIN THE LAST 5 DAYS. Performed at [facility], [place] [address], [place], [place] [address]    Report Status 11/05/2018 FINAL  Final  Blood culture (routine x 2)     Status: Abnormal   Collection Time: 11/01/18  3:49 PM   Specimen: BLOOD  Result Value Ref Range Status   Specimen Description BLOOD LEFT ANTECUBITAL  Final   Special Requests   Final    BOTTLES DRAWN AEROBIC AND ANAEROBIC Blood Culture results may not be optimal due to an inadequate volume of blood received in culture bottles   Culture  Setup Time   Final    GRAM NEGATIVE RODS IN BOTH AEROBIC AND ANAEROBIC BOTTLES CRITICAL RESULT CALLED TO, READ BACK BY AND VERIFIED WITH: Milford Cage, AT 3704 11/02/18 BY [facility] Performed at [facility], [place] [address] [place], [place] [address]    Culture ESCHERICHIA COLI (A)  Final   Report Status 11/05/2018 FINAL  Final   Organism ID, Bacteria ESCHERICHIA COLI  Final      Susceptibility   Escherichia coli - MIC*    AMPICILLIN 8 SENSITIVE Sensitive     CEFAZOLIN <=4 SENSITIVE Sensitive     CEFEPIME <=1 SENSITIVE Sensitive     CEFTAZIDIME <=1 SENSITIVE Sensitive     CEFTRIAXONE <=1 SENSITIVE Sensitive     CIPROFLOXACIN <=0.25 SENSITIVE Sensitive     GENTAMICIN <=1 SENSITIVE Sensitive     IMIPENEM <=0.25 SENSITIVE Sensitive     TRIMETH/SULFA <=20 SENSITIVE Sensitive     AMPICILLIN/SULBACTAM 4 SENSITIVE Sensitive     PIP/TAZO <=4 SENSITIVE Sensitive     Extended ESBL NEGATIVE Sensitive     * ESCHERICHIA COLI  Blood Culture ID Panel (Reflexed)     Status: Abnormal   Collection Time: 11/01/18  3:49 PM  Result Value Ref Range Status   Enterococcus species NOT DETECTED NOT DETECTED Final   Listeria monocytogenes NOT DETECTED NOT DETECTED Final   Staphylococcus species  NOT DETECTED NOT DETECTED Final   Staphylococcus aureus (BCID) NOT DETECTED NOT DETECTED Final   Streptococcus species NOT DETECTED NOT DETECTED Final   Streptococcus agalactiae NOT DETECTED  NOT DETECTED Final   Streptococcus pneumoniae NOT DETECTED NOT DETECTED Final   Streptococcus pyogenes NOT DETECTED NOT DETECTED Final   Acinetobacter baumannii NOT DETECTED NOT DETECTED Final   Enterobacteriaceae species DETECTED (A) NOT DETECTED Final    Comment: Enterobacteriaceae represent a large family of gram-negative bacteria, not a single organism. CRITICAL RESULT CALLED TO, READ BACK BY AND VERIFIED WITH: TJaneice Robinson PHARMD, AT 9983 11/02/18 BY D. VANHOOK    Enterobacter cloacae complex NOT DETECTED NOT DETECTED Final   Escherichia coli DETECTED (A) NOT DETECTED Final    Comment: CRITICAL RESULT CALLED TO, READ BACK BY AND VERIFIED WITH: T BAUMEISTER 11/02/18 0928 D VANHOOK    Klebsiella oxytoca NOT DETECTED NOT DETECTED Final   Klebsiella pneumoniae NOT DETECTED NOT DETECTED Final   Proteus species NOT DETECTED NOT DETECTED Final   Serratia marcescens NOT DETECTED NOT DETECTED Final   Carbapenem resistance NOT DETECTED NOT DETECTED Final   Haemophilus influenzae NOT DETECTED NOT DETECTED Final   Neisseria meningitidis NOT DETECTED NOT DETECTED Final   Pseudomonas aeruginosa NOT DETECTED NOT DETECTED Final   Candida albicans NOT DETECTED NOT DETECTED Final   Candida glabrata NOT DETECTED NOT DETECTED Final   Candida krusei NOT DETECTED NOT DETECTED Final   Candida parapsilosis NOT DETECTED NOT DETECTED Final   Candida tropicalis NOT DETECTED NOT DETECTED Final    Comment: Performed at [facility], [place] [address] [place], [place] [address]  SARS Coronavirus 2 by RT PCR (hospital order, performed in [facility] hospital lab) Nasopharyngeal Nasopharyngeal Swab     Status: None   Collection Time: 11/01/18  3:52 PM   Specimen: Nasopharyngeal Swab  Result Value Ref Range Status    SARS Coronavirus 2 NEGATIVE NEGATIVE Final    Comment: (NOTE) If result is NEGATIVE SARS-CoV-2 target nucleic acids are NOT DETECTED. The SARS-CoV-2 RNA is generally detectable in upper and lower  respiratory specimens during the acute phase of infection. The lowest  concentration of SARS-CoV-2 viral copies this assay can detect is 250  copies / mL. A negative result does not preclude SARS-CoV-2 infection  and should not be used as the sole basis for treatment or other  patient management decisions.  A negative result may occur with  improper specimen collection / handling, submission of specimen other  than nasopharyngeal swab, presence of viral mutation(s) within the  areas targeted by this assay, and inadequate number of viral copies  (<250 copies / mL). A negative result must be combined with clinical  observations, patient history, and epidemiological information. If result is POSITIVE SARS-CoV-2 target nucleic acids are DETECTED. The SARS-CoV-2 RNA is generally detectable in upper and lower  respiratory specimens dur ing the acute phase of infection.  Positive  results are indicative of active infection with SARS-CoV-2.  Clinical  correlation with patient history and other diagnostic information is  necessary to determine patient infection status.  Positive results do  not rule out bacterial infection or co-infection with other viruses. If result is PRESUMPTIVE POSTIVE SARS-CoV-2 nucleic acids MAY BE PRESENT.   A presumptive positive result was obtained on the submitted specimen  and confirmed on repeat testing.  While 2019 novel coronavirus  (SARS-CoV-2) nucleic acids may be present in the submitted sample  additional confirmatory testing may be necessary for epidemiological  and / or clinical management purposes  to differentiate between  SARS-CoV-2 and other Sarbecovirus currently known to infect humans.  If clinically indicated additional testing with an alternate test  methodology [phone]) is advised. The SARS-CoV-2 RNA is generally  detectable in upper and lower respiratory sp ecimens during the acute  phase of infection. The expected result is Negative. Fact Sheet for Patients:  StrictlyIdeas.no Fact Sheet for Healthcare Providers: BankingDealers.co.za This test is not yet approved or cleared by the [place] FDA and has been authorized for detection and/or diagnosis of SARS-CoV-2 by FDA under an Emergency Use Authorization (EUA).  This EUA will remain in effect (meaning this test can be used) for the duration of the COVID-19 declaration under Section 564(b)(1) of the Act, 21 U.S.C. section 360bbb-3(b)(1), unless the authorization is terminated or revoked sooner. Performed at [facility], [place] [address], [place], [place] [address]   Culture, Urine     Status: Abnormal   Collection Time: 11/01/18  6:34 PM   Specimen: Urine, Random  Result Value Ref Range Status   Specimen Description URINE, RANDOM  Final   Special Requests   Final    ADDED 2104  11/01/2018 Performed at [facility], [place] [address], [place], [place] [address]    Culture MULTIPLE SPECIES PRESENT, SUGGEST RECOLLECTION (A)  Final   Report Status 11/02/2018 FINAL  Final  MRSA PCR Screening     Status: None   Collection Time: 11/01/18 11:46 PM  Result Value Ref Range Status   MRSA by PCR NEGATIVE NEGATIVE Final    Comment:        The GeneXpert MRSA Assay (FDA approved for NASAL specimens only), is one component of a comprehensive MRSA colonization surveillance program. It is not intended to diagnose MRSA infection nor to guide or monitor treatment for MRSA infections. Performed at [facility], [place] [address], [place], [place] [address]   Respiratory Panel by PCR     Status: None   Collection Time: 11/02/18 12:18 AM   Specimen: Nasopharyngeal Swab; Respiratory  Result Value Ref Range Status    Adenovirus NOT DETECTED NOT DETECTED Final   Coronavirus 229E NOT DETECTED NOT DETECTED Final    Comment: (NOTE) The Coronavirus on the Respiratory Panel, DOES NOT test for the novel  Coronavirus (2019 nCoV)    Coronavirus HKU1 NOT DETECTED NOT DETECTED Final   Coronavirus NL63 NOT DETECTED NOT DETECTED Final   Coronavirus OC43 NOT DETECTED NOT DETECTED Final   Metapneumovirus NOT DETECTED NOT DETECTED Final   Rhinovirus / Enterovirus NOT DETECTED NOT DETECTED Final   Influenza A NOT DETECTED NOT DETECTED Final   Influenza B NOT DETECTED NOT DETECTED Final   Parainfluenza Virus 1 NOT DETECTED NOT DETECTED Final   Parainfluenza Virus 2 NOT DETECTED NOT DETECTED Final   Parainfluenza Virus 3 NOT DETECTED NOT DETECTED Final   Parainfluenza Virus 4 NOT DETECTED NOT DETECTED Final   Respiratory Syncytial Virus NOT DETECTED NOT DETECTED Final   Bordetella pertussis NOT DETECTED NOT DETECTED Final   Chlamydophila pneumoniae NOT DETECTED NOT DETECTED Final   Mycoplasma pneumoniae NOT DETECTED NOT DETECTED Final    Comment: Performed at [facility], [place]. [address] [place],  [address]     Janene Madeira, MSN, NP-C [facility] for Infectious Disease [email or web address] Pager: [phone] Office: [phone] [place]: [phone]

## 2018-11-08 NOTE — Progress Notes (Signed)
STROKE TEAM PROGRESS NOTE      INTERVAL HISTORY  She is awake and not following commands consistently.  No family or interpreter at the bedside.  TEE is planned.  Lower extremity venous Dopplers were negative for DVT.  She remains intubated for respiratory failure OBJECTIVE Vitals:   11/08/18 1300 11/08/18 1400 11/08/18 1500 11/08/18 1516  BP: 120/63 131/60 (!) 143/62 (!) 143/62  Pulse: 90 91 89 96  Resp: 18 18 18 18   Temp:      TempSrc:      SpO2: 97% 96% 99% 97%  Weight:      Height:        CBC:  Recent Labs  Lab 11/04/18 0449  11/06/18 0432 11/07/18 1139 11/08/18 0209  WBC 19.8*   < > 12.2* 14.0* 12.0*  NEUTROABS 17.0*  --  7.6  --   --   HGB 8.3*   < > 8.4* 8.7* 7.8*  HCT 25.4*   < > 25.2* 26.6* 23.5*  MCV 68.6*   < > 65.5* 66.3* 67.1*  PLT 48*   < > 74* 123* 140*   < > = values in this interval not displayed.    Basic Metabolic Panel:  Recent Labs  Lab 11/05/18 1725  11/07/18 0336 11/08/18 0209  NA  --    < > 136 137  K  --    < > 3.2* 4.1  CL  --    < > 97* 102  CO2  --    < > 28 28  GLUCOSE  --    < > 175* 204*  BUN  --    < > 17 16  CREATININE  --    < > 0.64 0.63  CALCIUM  --    < > 7.9* 7.6*  MG 1.4*   < > 2.0 1.7  PHOS 2.8  --   --  3.8   < > = values in this interval not displayed.    Lipid Panel:     Component Value Date/Time   CHOL 141 11/07/2018 1139   CHOL 174 08/30/2018 1056   TRIG 132 11/07/2018 1139   HDL 18 (L) 11/07/2018 1139   HDL 53 08/30/2018 1056   CHOLHDL 7.8 11/07/2018 1139   VLDL 26 11/07/2018 1139   LDLCALC 97 11/07/2018 1139   LDLCALC 91 08/30/2018 1056   HgbA1c:  Lab Results  Component Value Date   HGBA1C 8.1 (H) 11/02/2018   Urine Drug Screen: No results found for: LABOPIA, COCAINSCRNUR, LABBENZ, AMPHETMU, THCU, LABBARB  Alcohol Level No results found for: ETH  IMAGING   Ct Angio Head W Or Wo Contrast  Result Date: 11/08/2018 CLINICAL DATA:  Stroke follow-up EXAM: CT ANGIOGRAPHY HEAD AND NECK TECHNIQUE:  Multidetector CT imaging of the head and neck was performed using the standard protocol during bolus administration of intravenous contrast. Multiplanar CT image reconstructions and MIPs were obtained to evaluate the vascular anatomy. Carotid stenosis measurements (when applicable) are obtained utilizing NASCET criteria, using the distal internal carotid diameter as the denominator. CONTRAST:  73m OMNIPAQUE IOHEXOL 350 MG/ML SOLN COMPARISON:  Brain MRI 11/06/2018 FINDINGS: CT HEAD FINDINGS Brain: There is no mass, hemorrhage or extra-axial collection. The size and configuration of the ventricles and extra-axial CSF spaces are normal. Hypoattenuation left cerebellum at the site of known infarct. Bilateral cingulate gyri infarcts are also noted. Skull: The visualized skull base, calvarium and extracranial soft tissues are normal. Sinuses/Orbits: Right mastoid effusion. The orbits are normal. CTA  NECK FINDINGS SKELETON: There is no bony spinal canal stenosis. No lytic or blastic lesion. OTHER NECK: Normal pharynx, larynx and major salivary glands. No cervical lymphadenopathy. Unremarkable thyroid gland. UPPER CHEST: No pneumothorax or pleural effusion. No nodules or masses. AORTIC ARCH: There is mild calcific atherosclerosis of the aortic arch. There is no aneurysm, dissection or hemodynamically significant stenosis of the visualized portion of the aorta. Conventional 3 vessel aortic branching pattern. The visualized proximal subclavian arteries are widely patent. RIGHT CAROTID SYSTEM: No dissection, occlusion or aneurysm. Mild atherosclerotic calcification at the carotid bifurcation without hemodynamically significant stenosis. LEFT CAROTID SYSTEM: No dissection, occlusion or aneurysm. Mild atherosclerotic calcification at the carotid bifurcation without hemodynamically significant stenosis. VERTEBRAL ARTERIES: Left dominant configuration. Both origins are clearly patent. There is no dissection, occlusion or  flow-limiting stenosis to the skull base (V1-V3 segments). CTA HEAD FINDINGS POSTERIOR CIRCULATION: --Vertebral arteries: There is atherosclerotic calcification of the left V4 segment causing short segment severe stenosis. --Posterior inferior cerebellar arteries (PICA): Patent origins from the vertebral arteries. --Anterior inferior cerebellar arteries (AICA): Patent origins from the basilar artery. --Basilar artery: There is multifocal atherosclerotic irregularity of the basilar artery --Superior cerebellar arteries: Patent proximally. --Posterior cerebral arteries: There is a short segment occlusion of the left posterior cerebral artery P2 segment. The distal PCA is patent. There is a partial origin of the right PCA from the P-comm. ANTERIOR CIRCULATION: --Intracranial internal carotid arteries: Atherosclerotic calcification of the internal carotid arteries at the skull base without hemodynamically significant stenosis. --Anterior cerebral arteries (ACA): The right A1 segment is diminutive or absent. The left A1 segment is patent. The left A2 segment is occluded proximally. Right A2 segment is patent. --Middle cerebral arteries (MCA): Normal. VENOUS SINUSES: As permitted by contrast timing, patent. ANATOMIC VARIANTS: None Review of the MIP images confirms the above findings. IMPRESSION: 1. 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 2. Short segment occlusion of the proximal left posterior cerebral artery P2 segment. 3. Short segment severe stenosis of the left vertebral artery V4 segment. 4. No hemodynamically significant stenosis in the neck. 5. Small left cerebellar infarct. 6. Aortic Atherosclerosis (ICD10-I70.0). Electronically Signed   By: Ulyses Jarred M.D.   On: 11/08/2018 00:34   Ct Angio Neck W Or Wo Contrast  Result Date: 11/08/2018 CLINICAL DATA:  Stroke follow-up EXAM: CT ANGIOGRAPHY HEAD AND NECK TECHNIQUE:  Multidetector CT imaging of the head and neck was performed using the standard protocol during bolus administration of intravenous contrast. Multiplanar CT image reconstructions and MIPs were obtained to evaluate the vascular anatomy. Carotid stenosis measurements (when applicable) are obtained utilizing NASCET criteria, using the distal internal carotid diameter as the denominator. CONTRAST:  42m OMNIPAQUE IOHEXOL 350 MG/ML SOLN COMPARISON:  Brain MRI 11/06/2018 FINDINGS: CT HEAD FINDINGS Brain: There is no mass, hemorrhage or extra-axial collection. The size and configuration of the ventricles and extra-axial CSF spaces are normal. Hypoattenuation left cerebellum at the site of known infarct. Bilateral cingulate gyri infarcts are also noted. Skull: The visualized skull base, calvarium and extracranial soft tissues are normal. Sinuses/Orbits: Right mastoid effusion. The orbits are normal. CTA NECK FINDINGS SKELETON: There is no bony spinal canal stenosis. No lytic or blastic lesion. OTHER NECK: Normal pharynx, larynx and major salivary glands. No cervical lymphadenopathy. Unremarkable thyroid gland. UPPER CHEST: No pneumothorax or pleural effusion. No nodules or masses. AORTIC ARCH: There is mild calcific atherosclerosis of the aortic arch. There is no aneurysm, dissection or hemodynamically  significant stenosis of the visualized portion of the aorta. Conventional 3 vessel aortic branching pattern. The visualized proximal subclavian arteries are widely patent. RIGHT CAROTID SYSTEM: No dissection, occlusion or aneurysm. Mild atherosclerotic calcification at the carotid bifurcation without hemodynamically significant stenosis. LEFT CAROTID SYSTEM: No dissection, occlusion or aneurysm. Mild atherosclerotic calcification at the carotid bifurcation without hemodynamically significant stenosis. VERTEBRAL ARTERIES: Left dominant configuration. Both origins are clearly patent. There is no dissection, occlusion or  flow-limiting stenosis to the skull base (V1-V3 segments). CTA HEAD FINDINGS POSTERIOR CIRCULATION: --Vertebral arteries: There is atherosclerotic calcification of the left V4 segment causing short segment severe stenosis. --Posterior inferior cerebellar arteries (PICA): Patent origins from the vertebral arteries. --Anterior inferior cerebellar arteries (AICA): Patent origins from the basilar artery. --Basilar artery: There is multifocal atherosclerotic irregularity of the basilar artery --Superior cerebellar arteries: Patent proximally. --Posterior cerebral arteries: There is a short segment occlusion of the left posterior cerebral artery P2 segment. The distal PCA is patent. There is a partial origin of the right PCA from the P-comm. ANTERIOR CIRCULATION: --Intracranial internal carotid arteries: Atherosclerotic calcification of the internal carotid arteries at the skull base without hemodynamically significant stenosis. --Anterior cerebral arteries (ACA): The right A1 segment is diminutive or absent. The left A1 segment is patent. The left A2 segment is occluded proximally. Right A2 segment is patent. --Middle cerebral arteries (MCA): Normal. VENOUS SINUSES: As permitted by contrast timing, patent. ANATOMIC VARIANTS: None Review of the MIP images confirms the above findings. IMPRESSION: 1. 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 2. Short segment occlusion of the proximal left posterior cerebral artery P2 segment. 3. Short segment severe stenosis of the left vertebral artery V4 segment. 4. No hemodynamically significant stenosis in the neck. 5. Small left cerebellar infarct. 6. Aortic Atherosclerosis (ICD10-I70.0). Electronically Signed   By: Ulyses Jarred M.D.   On: 11/08/2018 00:34   Vas [place] Lower Extremity Venous (dvt)  Result Date: 11/08/2018  Lower Venous Study Indications: Swelling.  Comparison Study: No prior study.  Performing Technologist: Maudry Mayhew MHA, RDMS, RVT, RDCS  Examination Guidelines: A complete evaluation includes B-mode imaging, spectral Doppler, color Doppler, and power Doppler as needed of all accessible portions of each vessel. Bilateral testing is considered an integral part of a complete examination. Limited examinations for reoccurring indications may be performed as noted.  +---------+---------------+---------+-----------+----------+--------------+ RIGHT    CompressibilityPhasicitySpontaneityPropertiesThrombus Aging +---------+---------------+---------+-----------+----------+--------------+ CFV      Full           No       Yes                                 +---------+---------------+---------+-----------+----------+--------------+ SFJ      Full                                                        +---------+---------------+---------+-----------+----------+--------------+ FV Prox  Full                                                        +---------+---------------+---------+-----------+----------+--------------+ FV Mid  Full                                                        +---------+---------------+---------+-----------+----------+--------------+ FV DistalFull                                                        +---------+---------------+---------+-----------+----------+--------------+ PFV      Full                                                        +---------+---------------+---------+-----------+----------+--------------+ POP      Full           No       Yes                                 +---------+---------------+---------+-----------+----------+--------------+ PTV      Full                                                        +---------+---------------+---------+-----------+----------+--------------+ PERO     Full                                                         +---------+---------------+---------+-----------+----------+--------------+  +---------+---------------+---------+-----------+----------+--------------+ LEFT     CompressibilityPhasicitySpontaneityPropertiesThrombus Aging +---------+---------------+---------+-----------+----------+--------------+ CFV      Full           No       Yes                                 +---------+---------------+---------+-----------+----------+--------------+ SFJ      Full                                                        +---------+---------------+---------+-----------+----------+--------------+ FV Prox  Full                                                        +---------+---------------+---------+-----------+----------+--------------+ FV Mid   Full                                                        +---------+---------------+---------+-----------+----------+--------------+  FV DistalFull                                                        +---------+---------------+---------+-----------+----------+--------------+ PFV      Full                                                        +---------+---------------+---------+-----------+----------+--------------+ POP      Full           No       Yes                                 +---------+---------------+---------+-----------+----------+--------------+ PTV      Full                                                        +---------+---------------+---------+-----------+----------+--------------+ PERO     Full                                                        +---------+---------------+---------+-----------+----------+--------------+  Summary: Right: There is no evidence of deep vein thrombosis in the lower extremity. No cystic structure found in the popliteal fossa. Left: There is no evidence of deep vein thrombosis in the lower extremity. No cystic structure found in the popliteal fossa.  Bilateral  lower extremity venous flow is pulsatile, suggestive of possible elevated right heart pressure. *See table(s) above for measurements and observations.    Preliminary      Transthoracic Echocardiogram   Recent Results (from the past 43800 hour(s))  ECHOCARDIOGRAM COMPLETE   Collection Time: 11/02/18  8:17 AM  Result Value   Weight 2,405.66   Height 64   BP 113/58   Narrative     ECHOCARDIOGRAM REPORT       Patient Name:   Taisia THI Vicuna Date of Exam: 11/02/2018 Medical Rec #:  [id number]      Height:       64.0 in Accession #:    [id number]     Weight:       150.4 lb Date of Birth:  03-15-1950      BSA:          1.73 m Patient Age:    1 years       BP:           113/58 mmHg Patient Gender: F              HR:           114 bpm. Exam Location:  Inpatient  Procedure: 2D Echo, Color Doppler and Cardiac Doppler  STAT ECHO  Indications:    R06.9 DOE   History:        Patient has prior history of Echocardiogram examinations, most  recent 07/20/2013.   Sonographer:    Raquel Sarna Senior RDCS Referring Phys: [place]    Sonographer Comments: Patient supine on CPAP with respiration rate of 45. IMPRESSIONS    1. Left ventricular ejection fraction, by visual estimation, is 60 to 65%. The left ventricle has normal function. There is moderately increased left ventricular hypertrophy.  2. Left ventricular diastolic function could not be evaluated pattern of LV diastolic filling.  3. Global right ventricle has hyperdynamic systolic function.The right ventricular size is normal. No increase in right ventricular wall thickness.  4. Left atrial size was moderately dilated.  5. Right atrial size was normal.  6. Presence of pericardial fat pad.  7. Trivial pericardial effusion is present.  8. Moderate mitral annular calcification.  9. The mitral valve is grossly normal. Trace mitral valve regurgitation. 10. The tricuspid valve is normal in structure. Tricuspid valve  regurgitation is trivial. 11. The aortic valve is grossly normal Aortic valve regurgitation was not visualized by color flow Doppler. Mild aortic valve sclerosis without stenosis. 12. The pulmonic valve was not well visualized. Pulmonic valve regurgitation is not visualized by color flow Doppler. 13. Mildly elevated pulmonary artery systolic pressure. 14. Difficult study, with very limited parasternal windows. Appears to have normal EF, no gross wall motion abnormalities but limited sensitivity. RV is normal to small and hyperdynamic. LV has thickened basal septum but no clear obstruction seen.  Tachycardic in the 110s throughout study. 15. Cannot exclude small left to righ shunt through PFO (image 83).  FINDINGS  Left Ventricle: Left ventricular ejection fraction, by visual estimation, is 60 to 65%. The left ventricle has normal function. No evidence of left ventricular regional wall motion abnormalities. There is moderately increased left ventricular  hypertrophy. Concentric left ventricular hypertrophy. Spectral Doppler shows Left ventricular diastolic function could not be evaluated pattern of LV diastolic filling.  Right Ventricle: The right ventricular size is normal. No increase in right ventricular wall thickness. Global RV systolic function is has hyperdynamic systolic function. The tricuspid regurgitant velocity is 2.36 m/s, and with an assumed right atrial  pressure of 15 mmHg, the estimated right ventricular systolic pressure is mildly elevated at 37.3 mmHg.  Left Atrium: Left atrial size was moderately dilated.  Right Atrium: Right atrial size was normal in size  Pericardium: Trivial pericardial effusion is present. Presence of pericardial fat pad.  Mitral Valve: The mitral valve is grossly normal. Moderate mitral annular calcification. MV peak gradient, 11.3 mmHg. Trace mitral valve regurgitation.  Tricuspid Valve: The tricuspid valve is normal in structure. Tricuspid valve  regurgitation is trivial by color flow Doppler.  Aortic Valve: The aortic valve is grossly normal. Aortic valve regurgitation was not visualized by color flow Doppler. Mild aortic valve sclerosis is present, with no evidence of aortic valve stenosis. Aortic valve mean gradient measures 8.0 mmHg. Aortic  valve peak gradient measures 11.8 mmHg. Aortic valve area, by VTI measures 2.20 cm.  Pulmonic Valve: The pulmonic valve was not well visualized. Pulmonic valve regurgitation is not visualized by color flow Doppler.  Aorta: The aortic root and ascending aorta are structurally normal, with no evidence of dilitation.  Pulmonary Artery: The pulmonary artery is not well seen.  IAS/Shunts: Cannot exclude small left to righ shunt through PFO (image 83).     LEFT VENTRICLE PLAX 2D LVIDd:         4.00 cm LVIDs:         2.70 cm LV PW:  1.50 cm LV IVS:        1.70 cm LVOT diam:     2.00 cm LV SV:         43 ml LV SV Index:   24.30 LVOT Area:     3.14 cm   LV Volumes (MOD) LV area d, A2C:    28.50 cm LV area d, A4C:    24.80 cm LV area s, A2C:    17.80 cm LV area s, A4C:    17.00 cm LV major d, A2C:   8.24 cm LV major d, A4C:   7.88 cm LV major s, A2C:   7.18 cm LV major s, A4C:   7.04 cm LV vol d, MOD A2C: 83.0 ml LV vol d, MOD A4C: 64.7 ml LV vol s, MOD A2C: 38.6 ml LV vol s, MOD A4C: 35.4 ml LV SV MOD A2C:     44.4 ml LV SV MOD A4C:     64.7 ml LV SV MOD BP:      37.5 ml  RIGHT VENTRICLE RV S prime:     13.10 cm/s TAPSE (M-mode): 2.5 cm  LEFT ATRIUM             Index       RIGHT ATRIUM          Index LA diam:        4.40 cm 2.54 cm/m  RA Area:     9.96 cm LA Vol (A2C):   32.1 ml 18.52 ml/m RA Volume:   20.90 ml 12.06 ml/m LA Vol (A4C):   69.4 ml 40.05 ml/m LA Biplane Vol: 50.1 ml 28.91 ml/m  AORTIC VALVE AV Area (Vmax):    1.94 cm AV Area (Vmean):   1.71 cm AV Area (VTI):     2.20 cm AV Vmax:           172.00 cm/s AV Vmean:          134.000  cm/s AV VTI:            0.243 m AV Peak Grad:      11.8 mmHg AV Mean Grad:      8.0 mmHg LVOT Vmax:         106.00 cm/s LVOT Vmean:        73.100 cm/s LVOT VTI:          0.170 m LVOT/AV VTI ratio: 0.70   AORTA Ao Root diam: 2.50 cm Ao Asc diam:  2.80 cm  MITRAL VALVE             TRICUSPID VALVE MV Peak grad: 11.3 mmHg  TR Peak grad:   22.3 mmHg MV Mean grad: 6.0 mmHg   TR Vmax:        236.00 cm/s MV Vmax:      1.68 m/s MV Vmean:     117.0 cm/s SHUNTS MV VTI:       0.24 m     Systemic VTI:  0.17 m                          Systemic Diam: 2.00 cm    Buford Dresser MD Electronically signed by Buford Dresser MD Signature Date/Time: 11/02/2018/8:45:07 AM       Final     *Note: Due to a large number of results and/or encounters for the requested time period, some results have not been displayed. A complete set of results can be found in Results  Review.   ECG - SR rate 90-110 BPM. (See cardiology reading for complete details)  PHYSICAL EXAM Blood pressure (!) 143/62, pulse 96, temperature 99.6 F (37.6 C), temperature source Oral, resp. rate 18, height 5' 4"  (1.626 m), weight 69.3 kg, SpO2 97 %.  HEENT-  Normocephalic, no lesions, without obvious abnormality.  Normal external eye and conjunctiva.  Neck is stiff in flexion but not rotation.  Cardiovascular-, pulses palpable throughout   Lungs- Intubated, vented Extremities- Developing SQ edema Musculoskeletal-no joint tenderness, deformity or swelling Skin-warm and dry, no hyperpigmentation, vitiligo, or suspicious lesions  Mental Status: Patient intubated, no propofol, some low dose fentanyl only.  Awake and alert.  Will follow simple commands in all 4 extremities only when spoken in [place] Cough and gag reflex intact, aroused to light touch and verbal stimuli. Patient was able to wiggle her toes on BLE. Able to squeeze hands bilaterally and wiggle fingers as well as stick out her tongue on command (in  [place] only).  CN: Patient does not track well, did not cross midline, but seems to attemtp, left gaze deviation noted. Appears to stare off behind examiner and daughter. Seems to be a decreased blink to threat on right. PERRL sluggish.  Face flaccidly symmetric around ETT.   Motor: Diffusely weak throughout. BUE able to grip hands 4-/5 and wiggle fingers.  BLE able to wiggle toes, but unable to break gravity. There was brief break gravity with left arm.  * Per RN, during MRI patient became agitated and began vigorously moving BUE and BLE, slamming limbs against the guardrail of the bed and able to lift antigravity.  Sensory: able to localize to noxious. Plantars: Right: Briskly downgoing                Left: Briskly upgoing Cerebellar/Gait: Unable to perform  HOME MEDICATIONS:  Medications Prior to Admission  Medication Sig Dispense Refill  . Accu-Chek Softclix Lancets lancets Use as instructed. Check blood glucose levels twice per day by fingerstick 200 each 3  . amLODipine (NORVASC) 10 MG tablet Take 1 tablet (10 mg total) by mouth daily. 90 tablet 3  . atorvastatin (LIPITOR) 40 MG tablet Take 1 tablet (40 mg total) by mouth daily. 90 tablet 0  . Blood Glucose Monitoring Suppl (ACCU-CHEK AVIVA PLUS) w/Device KIT 1 each by Does not apply route 3 (three) times daily. METER STOPPED WORKING 1 kit 0  . glimepiride (AMARYL) 2 MG tablet Take 1 tablet (2 mg total) by mouth daily before breakfast. 90 tablet 1  . glucose blood (ACCU-CHEK AVIVA) test strip Use as instructed. Check blood glucose levels twice per day by fingerstick 200 each 3  . glucose blood test strip Provide patient with insurance approved strips. Use as instructed. Inject into the skin once daily. 100 each 12  . hydrochlorothiazide (HYDRODIURIL) 25 MG tablet Take 1 tablet (25 mg total) by mouth daily. 90 tablet 3  . lisinopril (ZESTRIL) 40 MG tablet Take 1 tablet (40 mg total) by mouth daily. 90 tablet 3  . sitaGLIPtin-metformin  (JANUMET) 50-1000 MG tablet Take 1 tablet by mouth 2 (two) times daily with a meal. 180 tablet 0  . Vitamin D, Ergocalciferol, (DRISDOL) 1.25 MG (50000 UT) CAPS capsule Take 1 capsule (50,000 Units total) by mouth every 7 (seven) days. 12 capsule 3      HOSPITAL MEDICATIONS:  . aspirin  81 mg Oral Daily  . chlorhexidine gluconate (MEDLINE KIT)  15 mL Mouth Rinse BID  . Chlorhexidine Gluconate Cloth  6 each Topical Daily  . famotidine  20 mg Per Tube Daily  . feeding supplement (PRO-STAT SUGAR FREE 64)  30 mL Per Tube BID  . heparin injection (subcutaneous)  5,000 Units Subcutaneous Q8H  . insulin aspart  0-15 Units Subcutaneous Q4H  . insulin glargine  20 Units Subcutaneous Q24H  . mouth rinse  15 mL Mouth Rinse 10 times per day  . sodium chloride flush  3 mL Intravenous Q12H    ALLERGIES No Known Allergies  ASSESSMENT/PLAN Ms. Inara Dike is a 68 y.o. female with history of DM and HTN who presented to hospital with AMS. There was a c/o of 2-3  day history of fever, fatigue and some abdominal pain. MRI showed multiple areas of stroke in the callosal body, splenium and left gyrus, left globus pallidus. This is concerning for septic emboli given her fevers and +bld cx.   Multiple strokes involving corpus callosum body, splenium, left basal ganglia likely embolic, possibly septic emboli versus paradoxical embolism.  Resultant  AMS, right side weak, left gaze  Code Stroke CT Head -   Not done  CT head - L cerebellar hypodensity  MRI head- multiple areas of stroke in the callosal body, splenium and left gyrus, left globus pallidus.  MRA head   CTA H&N   CT Perfusion  Carotid Doppler -  2D Echo - 60% EF, LA dilation, ?L->R PFO  [employer] Virus 2  neg  LDL - 97    Component Value Date/Time   LDLCALC 97 11/07/2018 1139   LDLCALC 91 08/30/2018 1056    HgbA1c - 8.1  UDS not done  VTE prophylaxis - SCDs Diet  Diet Order            Diet NPO time specified  Diet  effective now              none prior to admission, now on ASA  unalbe to be counseled to be compliant with her antithrombotic medications at this time  Ongoing aggressive stroke risk factor management  Therapy recommendations:  pending  Disposition:  Pending  Hypertension  Home BP meds: Norvasc, Zestril  Current BP meds: none  Stable, currently running low . Permissive hypertension (OK if < 220/120) but gradually normalize in 5-7 days . Long-term BP goal normotensive  Hyperlipidemia  Home Lipid lowering medication: Lipitor 40  LDL 97, goal < 70  Current lipid lowering medication: Lipitor 40 mg daily  Continue statin at discharge  Diabetes  Home diabetic meds: Amaryl, Janumet  Current diabetic meds: SSI  HgbA1c 8.1, goal < 7.0 Recent Labs    11/08/18 0757 11/08/18 1139 11/08/18 1529  GLUCAP 219* 202* 197*    Other Stroke Risk Factors  Advanced age  Cigarette smoker; when able will be advised to stop smoking   Other Active Problems  Pneumonia  E.Coli Bacteremia (+blood cx)- abx started, will need ID consulted  Fevers- normalizing  AKI- monitoring labs  Acute respiratory failure- intubated, vent support  Encephalopathy- AMS multi-factoral d/t stroke, infection, toxic-metabolic. Improving slowly  Hospital day # 7   She presented with pneumonia and respiratory difficulty and was found to be septic with E. coli bacteremia and MRI shows multiple cortical and subcortical embolic infarcts need to rule out bacterial endocarditis and 2D echo also shows a possible PFO hence paradoxical embolism may also be a possibility.  Recommend check  TEE for vegetations and cardiac source of embolism.  Extubate as tolerated as per critical care team  long discussion with  Dr. Lynetta Mare critical care MD and answered questions This patient is critically ill and at significant risk of neurological worsening, death and care requires constant monitoring of vital signs,  hemodynamics,respiratory and cardiac monitoring, extensive review of multiple databases, frequent neurological assessment, discussion with family, other specialists and medical decision making of high complexity.I have made any additions or clarifications directly to the above note.This critical care time does not reflect procedure time, or teaching time or supervisory time of PA/NP/Med Resident etc but could involve care discussion time.  I spent 30 minutes of neurocritical care time  in the care of  this patient.       Antony Contras, MD Medical Director [facility] Stroke [facility] Pager: [phone] 11/08/2018 3:54 PM To contact Stroke Continuity provider, please refer to [email or web address] After hours, contact General Neurology

## 2018-11-08 NOTE — Plan of Care (Signed)
°  Problem: Clinical Measurements: °Goal: Ability to maintain clinical measurements within normal limits will improve °Outcome: Progressing °Goal: Will remain free from infection °Outcome: Progressing °Goal: Diagnostic test results will improve °Outcome: Progressing °Goal: Respiratory complications will improve °Outcome: Progressing °  °

## 2018-11-08 NOTE — Progress Notes (Signed)
Per Dr. Anthony Sar paged about completed routine neck/head CT results. Will continue to monitor closely.   Clint Bolder, RN  11/08/18 2:48 AM

## 2018-11-09 ENCOUNTER — Inpatient Hospital Stay (HOSPITAL_COMMUNITY): Payer: Medicare Other

## 2018-11-09 DIAGNOSIS — J9601 Acute respiratory failure with hypoxia: Secondary | ICD-10-CM | POA: Diagnosis not present

## 2018-11-09 DIAGNOSIS — A419 Sepsis, unspecified organism: Secondary | ICD-10-CM | POA: Diagnosis not present

## 2018-11-09 DIAGNOSIS — N12 Tubulo-interstitial nephritis, not specified as acute or chronic: Secondary | ICD-10-CM | POA: Diagnosis not present

## 2018-11-09 DIAGNOSIS — I6340 Cerebral infarction due to embolism of unspecified cerebral artery: Secondary | ICD-10-CM | POA: Diagnosis not present

## 2018-11-09 DIAGNOSIS — R7881 Bacteremia: Secondary | ICD-10-CM

## 2018-11-09 DIAGNOSIS — B9620 Unspecified Escherichia coli [E. coli] as the cause of diseases classified elsewhere: Secondary | ICD-10-CM | POA: Diagnosis not present

## 2018-11-09 LAB — BASIC METABOLIC PANEL
Anion gap: 18 — ABNORMAL HIGH (ref 5–15)
BUN: 30 mg/dL — ABNORMAL HIGH (ref 8–23)
CO2: 29 mmol/L (ref 22–32)
Calcium: 9 mg/dL (ref 8.9–10.3)
Chloride: 90 mmol/L — ABNORMAL LOW (ref 98–111)
Creatinine, Ser: 0.96 mg/dL (ref 0.44–1.00)
GFR calc Af Amer: >60 mL/min (ref >60)
GFR calc non Af Amer: >60 mL/min (ref >60)
Glucose, Bld: 231 mg/dL — ABNORMAL HIGH (ref 70–99)
Potassium: 4 mmol/L (ref 3.5–5.1)
Sodium: 137 mmol/L (ref 135–145)

## 2018-11-09 LAB — CBC
HCT: 25.3 % — ABNORMAL LOW (ref 36.0–46.0)
Hemoglobin: 8 g/dL — ABNORMAL LOW (ref 12.0–15.0)
MCH: 21.6 pg — ABNORMAL LOW (ref 26.0–34.0)
MCHC: 31.6 g/dL (ref 30.0–36.0)
MCV: 68.4 fL — ABNORMAL LOW (ref 80.0–100.0)
Platelets: 224 10*3/uL (ref 150–400)
RBC: 3.7 MIL/uL — ABNORMAL LOW (ref 3.87–5.11)
RDW: 14.2 % (ref 11.5–15.5)
WBC: 14.6 10*3/uL — ABNORMAL HIGH (ref 4.0–10.5)
nRBC: 0.1 % (ref 0.0–0.2)

## 2018-11-09 LAB — RENAL FUNCTION PANEL
Albumin: 2.2 g/dL — ABNORMAL LOW (ref 3.5–5.0)
Anion gap: 10 (ref 5–15)
BUN: 20 mg/dL (ref 8–23)
CO2: 31 mmol/L (ref 22–32)
Calcium: 8.5 mg/dL — ABNORMAL LOW (ref 8.9–10.3)
Chloride: 95 mmol/L — ABNORMAL LOW (ref 98–111)
Creatinine, Ser: 0.82 mg/dL (ref 0.44–1.00)
GFR calc Af Amer: >60 mL/min (ref >60)
GFR calc non Af Amer: >60 mL/min (ref >60)
Glucose, Bld: 193 mg/dL — ABNORMAL HIGH (ref 70–99)
Phosphorus: 3.6 mg/dL (ref 2.5–4.6)
Potassium: 4.4 mmol/L (ref 3.5–5.1)
Sodium: 136 mmol/L (ref 135–145)

## 2018-11-09 LAB — GLUCOSE, CAPILLARY
Glucose-Capillary: 192 mg/dL — ABNORMAL HIGH (ref 70–99)
Glucose-Capillary: 137 mg/dL — ABNORMAL HIGH (ref 70–99)
Glucose-Capillary: 124 mg/dL — ABNORMAL HIGH (ref 70–99)
Glucose-Capillary: 121 mg/dL — ABNORMAL HIGH (ref 70–99)
Glucose-Capillary: 222 mg/dL — ABNORMAL HIGH (ref 70–99)
Glucose-Capillary: 254 mg/dL — ABNORMAL HIGH (ref 70–99)

## 2018-11-09 LAB — MAGNESIUM
Magnesium: 1.7 mg/dL (ref 1.7–2.4)
Magnesium: 1.9 mg/dL (ref 1.7–2.4)

## 2018-11-09 LAB — TROPONIN I (HIGH SENSITIVITY): Troponin I (High Sensitivity): 36 ng/L — ABNORMAL HIGH (ref <18)

## 2018-11-09 MED ORDER — SODIUM CHLORIDE 0.9 % IV SOLN: INTRAVENOUS | Status: DC

## 2018-11-09 MED ORDER — AMIODARONE HCL IN DEXTROSE 360-4.14 MG/200ML-% IV SOLN
60.0000 mg/h | INTRAVENOUS | Status: AC
  Administered 2018-11-09: 23:00:00 60 mg/h via INTRAVENOUS

## 2018-11-09 MED ORDER — AMIODARONE HCL IN DEXTROSE 360-4.14 MG/200ML-% IV SOLN
INTRAVENOUS | Status: AC
  Filled 2018-11-09: qty 200

## 2018-11-09 MED ORDER — DEXMEDETOMIDINE HCL IN NACL 400 MCG/100ML IV SOLN: 0.4000 ug/kg/h | INTRAVENOUS | Status: DC

## 2018-11-09 MED ORDER — ADENOSINE 6 MG/2ML IV SOLN
INTRAVENOUS | Status: AC
  Filled 2018-11-09: qty 2

## 2018-11-09 MED ORDER — AMIODARONE IV BOLUS ONLY 150 MG/100ML: 150.0000 mg | Freq: Once | INTRAVENOUS | Status: DC

## 2018-11-09 MED ORDER — FUROSEMIDE 10 MG/ML IJ SOLN
40.0000 mg | Freq: Two times a day (BID) | INTRAMUSCULAR | Status: AC
  Administered 2018-11-09 – 2018-11-10 (×3): 40 mg via INTRAVENOUS
  Filled 2018-11-09 (×3): qty 4

## 2018-11-09 MED ORDER — PROPOFOL 10 MG/ML IV BOLUS
50.0000 mg | Freq: Once | INTRAVENOUS | Status: AC
  Administered 2018-11-09: 50 mg via INTRAVENOUS
  Filled 2018-11-09: qty 20

## 2018-11-09 MED ORDER — METOPROLOL TARTRATE 5 MG/5ML IV SOLN
5.0000 mg | Freq: Four times a day (QID) | INTRAVENOUS | Status: DC | PRN
  Filled 2018-11-09: qty 5

## 2018-11-09 MED ORDER — AMIODARONE HCL IN DEXTROSE 360-4.14 MG/200ML-% IV SOLN
30.0000 mg/h | INTRAVENOUS | Status: AC
  Administered 2018-11-10: 30 mg/h via INTRAVENOUS
  Filled 2018-11-09: qty 200

## 2018-11-09 MED ORDER — AMIODARONE LOAD VIA INFUSION
150.0000 mg | Freq: Once | INTRAVENOUS | Status: AC
  Administered 2018-11-09: 150 mg via INTRAVENOUS
  Filled 2018-11-09: qty 83.34

## 2018-11-09 MED ORDER — HEPARIN (PORCINE) 25000 UT/250ML-% IV SOLN
750.0000 [IU]/h | INTRAVENOUS | Status: DC
  Administered 2018-11-09: 22:00:00 850 [IU]/h via INTRAVENOUS
  Filled 2018-11-09 (×2): qty 250

## 2018-11-09 MED ORDER — ROCURONIUM BROMIDE 50 MG/5ML IV SOLN
40.0000 mg | Freq: Once | INTRAVENOUS | Status: AC
  Administered 2018-11-09: 14:00:00 40 mg via INTRAVENOUS
  Filled 2018-11-09 (×2): qty 4

## 2018-11-09 MED ORDER — ROCURONIUM BROMIDE 10 MG/ML (PF) SYRINGE
PREFILLED_SYRINGE | INTRAVENOUS | Status: AC
  Filled 2018-11-09: qty 10

## 2018-11-09 NOTE — Progress Notes (Signed)
Inpatient Diabetes Program Recommendations  AACE/ADA: New Consensus Statement on Inpatient Glycemic Control (2015)  Target Ranges:  Prepandial:   less than 140 mg/dL      Peak postprandial:   less than 180 mg/dL (1-2 hours)      Critically ill patients:  140 - 180 mg/dL   Results for Aimee Parker, Aimee Parker [facility] (MRN [id number]) as of 11/09/2018 11:18  Ref. Range 11/08/2018 00:06 11/08/2018 03:47 11/08/2018 07:57 11/08/2018 11:39 11/08/2018 15:29 11/08/2018 20:37  Glucose-Capillary Latest Ref Range: 70 - 99 mg/dL 221 (H) 196 (H) 219 (H) 202 (H) 197 (H) 193 (H)    Results for HAZLEIGH, MCCLEAVE [facility] (MRN [id number]) as of 11/09/2018 11:18  Ref. Range 11/08/2018 23:14 11/09/2018 04:09 11/09/2018 08:10  Glucose-Capillary Latest Ref Range: 70 - 99 mg/dL 227 (H) 192 (H) 137 (H)    Home DM Meds: Amaryl 2 mg Daily       Janumet 50/1000 mg BID  Current Orders: Lantus 20 units Daily     Novolog Moderate Correction Scale/ SSI (0-15 units) Q4 hours     Vital AF Tube Feeds suspended at 2am today for TEE.  When tube feeds are running, note patient having CBGs in the low 200s.    MD- Once tube feeds are resumed today, please consider adding Novolog Tube Feed coverage to current inpatient insulin regimen:  Novolog 3 units Q4 hours  HOLD if tube feeds HELD for any reason    --Will follow patient during hospitalization--  Wyn Quaker RN, MSN, CDE Diabetes Coordinator Inpatient Glycemic Control Team Team Pager: [phone] (8a-5p)

## 2018-11-09 NOTE — Progress Notes (Addendum)
ANTICOAGULATION CONSULT NOTE - Follow Up Consult  Pharmacy Consult for Heparin Indication: atrial fibrillation  No Known Allergies  Patient Measurements: Height: 5\' 4"  (162.6 cm) Weight: 150 lb 12.7 oz (68.4 kg) IBW/kg (Calculated) : 54.7 Heparin Dosing Weight: 68.4 kg  Vital Signs: Temp: 100.1 F (37.8 C) (10/28 1600) Temp Source: Other (Comment) (10/28 1948) BP: 148/80 (10/28 1948) Pulse Rate: 19 (10/28 1948)  Labs: Recent Labs    11/07/18 0336  11/07/18 1139 11/08/18 0209 11/09/18 0513  HGB  --    < > 8.7* 7.8* 8.0*  HCT  --   --  26.6* 23.5* 25.3*  PLT  --   --  123* 140* 224  LABPROT  --   --  13.4  --   --   INR  --   --  1.0  --   --   CREATININE 0.64  --   --  0.63 0.82   < > = values in this interval not displayed.    Estimated Creatinine Clearance: 62.4 mL/min (by C-G formula based on SCr of 0.82 mg/dL).   Assessment: 68 yr old female with new atrial fibrillation. She presented to the hospital on 11/01/18 with AMS. MRI showed multiple strokes involving corpus callosum body, splenium, left basal ganglia, likely embolic, possibly septic emboli vs paradoxical embolism.  Other medical problems include DM, HTN, HLD, E coli bacteremia/ pyelonephritis, pneumonia, acute respiratory failure (intubated) and AKI.  TEE done on 10/28 was negative for clot, masses or vegetations; H/H 8.0/25.3, platelets 224; INR on 10/26 was 1.0.  Patient has been receiving SQ heparin 5000 units Q 8 hrs; last dose was at 15:19 PM this afternoon. She was on no anticoagulants prior to admission.  Goal of Therapy:  Heparin level 0.3-0.5 units/ml Monitor platelets by anticoagulation protocol: Yes   Plan:  Initiate heparin infusion at 850 units/hr (no bolus) Check 6-hr heparin level Monitor daily CBC, heparin level Monitor for signs/symptoms of bleeding  Gillermina Hu, PharmD, BCPS, [facility] Clinical Pharmacist 11/09/2018,8:52 PM

## 2018-11-09 NOTE — Progress Notes (Signed)
[facility] for Infectious Disease    Date of Admission:  11/01/2018      ID: Aimee Parker is a 68 y.o. female with  E.coli bacteremia with pyelonephritis subsequently developed embolic stroke Principal Problem:   Sepsis ([place]) Active Problems:   Acute respiratory failure ([place])   Pneumonia   Pyelonephritis   Endotracheal tube present   Septic shock ([place])   Cerebral embolism with cerebral infarction   E coli bacteremia    Subjective: Afebrile, underwent TEE which ruled out endocarditis, no vegetations.   Medications:   aspirin  81 mg Oral Daily   chlorhexidine gluconate (MEDLINE KIT)  15 mL Mouth Rinse BID   Chlorhexidine Gluconate Cloth  6 each Topical Daily   famotidine  20 mg Per Tube Daily   feeding supplement (PRO-STAT SUGAR FREE 64)  30 mL Per Tube BID   furosemide  40 mg Intravenous BID   heparin injection (subcutaneous)  5,000 Units Subcutaneous Q8H   insulin aspart  0-15 Units Subcutaneous Q4H   insulin glargine  20 Units Subcutaneous Q24H   mouth rinse  15 mL Mouth Rinse 10 times per day   sodium chloride flush  3 mL Intravenous Q12H    Objective: Vital signs in last 24 hours: Temp:  [99.4 F (37.4 C)-100.3 F (37.9 C)] 100.1 F (37.8 C) (10/28 1600) Pulse Rate:  [85-98] 89 (10/28 1526) Resp:  [16-33] 33 (10/28 1526) BP: (107-145)/(55-72) 127/72 (10/28 1526) SpO2:  [96 %-100 %] 97 % (10/28 1526) FiO2 (%):  [40 %-60 %] 60 % (10/28 1526) Weight:  [68.4 kg] 68.4 kg (10/28 0500)  Physical Exam  Constitutional:  Opens eyes to verbal stimuli. appears well-developed and well-nourished. No distress.  HENT: [place]/AT, PERRLA, no scleral icterus Mouth/Throat: Oropharynx is clear and moist. No oropharyngeal exudate.  Cardiovascular: Normal rate, regular rhythm and normal heart sounds. Exam reveals no gallop and no friction rub.  No murmur heard.  Pulmonary/Chest: Effort normal and breath sounds normal. No respiratory distress.  has no wheezes.   Neck = supple, no nuchal rigidity Abdominal: Soft. Bowel sounds are normal.  exhibits no distension. There is no tenderness.  Lymphadenopathy: no cervical adenopathy. No axillary adenopathy Neurological: alert and oriented to person, place, and time.  Skin: Skin is warm and dry. No rash noted. No erythema.  Psychiatric: a normal mood and affect.  behavior is normal.   Lab Results Recent Labs    11/08/18 0209 11/09/18 0513  WBC 12.0* 14.6*  HGB 7.8* 8.0*  HCT 23.5* 25.3*  NA 137 136  K 4.1 4.4  CL 102 95*  CO2 28 31  BUN 16 20  CREATININE 0.63 0.82   Liver Panel Recent Labs    11/08/18 0209 11/09/18 0513  ALBUMIN 1.7* 2.2*   Sedimentation Rate No results for input(s): ESRSEDRATE in the last 72 hours. C-Reactive Protein No results for input(s): CRP in the last 72 hours.  Microbiology: reviewed Studies/Results: Ct Angio Head W Or Wo Contrast  Result Date: 11/08/2018 CLINICAL DATA:  Stroke follow-up EXAM: CT ANGIOGRAPHY HEAD AND NECK TECHNIQUE: Multidetector CT imaging of the head and neck was performed using the standard protocol during bolus administration of intravenous contrast. Multiplanar CT image reconstructions and MIPs were obtained to evaluate the vascular anatomy. Carotid stenosis measurements (when applicable) are obtained utilizing NASCET criteria, using the distal internal carotid diameter as the denominator. CONTRAST:  52m OMNIPAQUE IOHEXOL 350 MG/ML SOLN COMPARISON:  Brain MRI 11/06/2018 FINDINGS: CT HEAD FINDINGS Brain:  There is no mass, hemorrhage or extra-axial collection. The size and configuration of the ventricles and extra-axial CSF spaces are normal. Hypoattenuation left cerebellum at the site of known infarct. Bilateral cingulate gyri infarcts are also noted. Skull: The visualized skull base, calvarium and extracranial soft tissues are normal. Sinuses/Orbits: Right mastoid effusion. The orbits are normal. CTA NECK FINDINGS SKELETON: There is no bony  spinal canal stenosis. No lytic or blastic lesion. OTHER NECK: Normal pharynx, larynx and major salivary glands. No cervical lymphadenopathy. Unremarkable thyroid gland. UPPER CHEST: No pneumothorax or pleural effusion. No nodules or masses. AORTIC ARCH: There is mild calcific atherosclerosis of the aortic arch. There is no aneurysm, dissection or hemodynamically significant stenosis of the visualized portion of the aorta. Conventional 3 vessel aortic branching pattern. The visualized proximal subclavian arteries are widely patent. RIGHT CAROTID SYSTEM: No dissection, occlusion or aneurysm. Mild atherosclerotic calcification at the carotid bifurcation without hemodynamically significant stenosis. LEFT CAROTID SYSTEM: No dissection, occlusion or aneurysm. Mild atherosclerotic calcification at the carotid bifurcation without hemodynamically significant stenosis. VERTEBRAL ARTERIES: Left dominant configuration. Both origins are clearly patent. There is no dissection, occlusion or flow-limiting stenosis to the skull base (V1-V3 segments). CTA HEAD FINDINGS POSTERIOR CIRCULATION: --Vertebral arteries: There is atherosclerotic calcification of the left V4 segment causing short segment severe stenosis. --Posterior inferior cerebellar arteries (PICA): Patent origins from the vertebral arteries. --Anterior inferior cerebellar arteries (AICA): Patent origins from the basilar artery. --Basilar artery: There is multifocal atherosclerotic irregularity of the basilar artery --Superior cerebellar arteries: Patent proximally. --Posterior cerebral arteries: There is a short segment occlusion of the left posterior cerebral artery P2 segment. The distal PCA is patent. There is a partial origin of the right PCA from the P-comm. ANTERIOR CIRCULATION: --Intracranial internal carotid arteries: Atherosclerotic calcification of the internal carotid arteries at the skull base without hemodynamically significant stenosis. --Anterior cerebral  arteries (ACA): The right A1 segment is diminutive or absent. The left A1 segment is patent. The left A2 segment is occluded proximally. Right A2 segment is patent. --Middle cerebral arteries (MCA): Normal. VENOUS SINUSES: As permitted by contrast timing, patent. ANATOMIC VARIANTS: None Review of the MIP images confirms the above findings. IMPRESSION: 1. 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 2. Short segment occlusion of the proximal left posterior cerebral artery P2 segment. 3. Short segment severe stenosis of the left vertebral artery V4 segment. 4. No hemodynamically significant stenosis in the neck. 5. Small left cerebellar infarct. 6. Aortic Atherosclerosis (ICD10-I70.0). Electronically Signed   By: Ulyses Jarred M.D.   On: 11/08/2018 00:34   Ct Angio Neck W Or Wo Contrast  Result Date: 11/08/2018 CLINICAL DATA:  Stroke follow-up EXAM: CT ANGIOGRAPHY HEAD AND NECK TECHNIQUE: Multidetector CT imaging of the head and neck was performed using the standard protocol during bolus administration of intravenous contrast. Multiplanar CT image reconstructions and MIPs were obtained to evaluate the vascular anatomy. Carotid stenosis measurements (when applicable) are obtained utilizing NASCET criteria, using the distal internal carotid diameter as the denominator. CONTRAST:  67m OMNIPAQUE IOHEXOL 350 MG/ML SOLN COMPARISON:  Brain MRI 11/06/2018 FINDINGS: CT HEAD FINDINGS Brain: There is no mass, hemorrhage or extra-axial collection. The size and configuration of the ventricles and extra-axial CSF spaces are normal. Hypoattenuation left cerebellum at the site of known infarct. Bilateral cingulate gyri infarcts are also noted. Skull: The visualized skull base, calvarium and extracranial soft tissues are normal. Sinuses/Orbits: Right mastoid effusion. The orbits are normal. CTA NECK  FINDINGS SKELETON: There is no bony spinal canal  stenosis. No lytic or blastic lesion. OTHER NECK: Normal pharynx, larynx and major salivary glands. No cervical lymphadenopathy. Unremarkable thyroid gland. UPPER CHEST: No pneumothorax or pleural effusion. No nodules or masses. AORTIC ARCH: There is mild calcific atherosclerosis of the aortic arch. There is no aneurysm, dissection or hemodynamically significant stenosis of the visualized portion of the aorta. Conventional 3 vessel aortic branching pattern. The visualized proximal subclavian arteries are widely patent. RIGHT CAROTID SYSTEM: No dissection, occlusion or aneurysm. Mild atherosclerotic calcification at the carotid bifurcation without hemodynamically significant stenosis. LEFT CAROTID SYSTEM: No dissection, occlusion or aneurysm. Mild atherosclerotic calcification at the carotid bifurcation without hemodynamically significant stenosis. VERTEBRAL ARTERIES: Left dominant configuration. Both origins are clearly patent. There is no dissection, occlusion or flow-limiting stenosis to the skull base (V1-V3 segments). CTA HEAD FINDINGS POSTERIOR CIRCULATION: --Vertebral arteries: There is atherosclerotic calcification of the left V4 segment causing short segment severe stenosis. --Posterior inferior cerebellar arteries (PICA): Patent origins from the vertebral arteries. --Anterior inferior cerebellar arteries (AICA): Patent origins from the basilar artery. --Basilar artery: There is multifocal atherosclerotic irregularity of the basilar artery --Superior cerebellar arteries: Patent proximally. --Posterior cerebral arteries: There is a short segment occlusion of the left posterior cerebral artery P2 segment. The distal PCA is patent. There is a partial origin of the right PCA from the P-comm. ANTERIOR CIRCULATION: --Intracranial internal carotid arteries: Atherosclerotic calcification of the internal carotid arteries at the skull base without hemodynamically significant stenosis. --Anterior cerebral arteries  (ACA): The right A1 segment is diminutive or absent. The left A1 segment is patent. The left A2 segment is occluded proximally. Right A2 segment is patent. --Middle cerebral arteries (MCA): Normal. VENOUS SINUSES: As permitted by contrast timing, patent. ANATOMIC VARIANTS: None Review of the MIP images confirms the above findings. IMPRESSION: 1. 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 2. Short segment occlusion of the proximal left posterior cerebral artery P2 segment. 3. Short segment severe stenosis of the left vertebral artery V4 segment. 4. No hemodynamically significant stenosis in the neck. 5. Small left cerebellar infarct. 6. Aortic Atherosclerosis (ICD10-I70.0). Electronically Signed   By: Ulyses Jarred M.D.   On: 11/08/2018 00:34   Vas [place] Lower Extremity Venous (dvt)  Result Date: 11/08/2018  Lower Venous Study Indications: Swelling.  Comparison Study: No prior study. Performing Technologist: Maudry Mayhew MHA, RDMS, RVT, RDCS  Examination Guidelines: A complete evaluation includes B-mode imaging, spectral Doppler, color Doppler, and power Doppler as needed of all accessible portions of each vessel. Bilateral testing is considered an integral part of a complete examination. Limited examinations for reoccurring indications may be performed as noted.  +---------+---------------+---------+-----------+----------+--------------+  RIGHT     Compressibility Phasicity Spontaneity Properties Thrombus Aging  +---------+---------------+---------+-----------+----------+--------------+  CFV       Full            No        Yes                                    +---------+---------------+---------+-----------+----------+--------------+  SFJ       Full                                                             +---------+---------------+---------+-----------+----------+--------------+  FV Prox   Full                                                              +---------+---------------+---------+-----------+----------+--------------+  FV Mid    Full                                                             +---------+---------------+---------+-----------+----------+--------------+  FV Distal Full                                                             +---------+---------------+---------+-----------+----------+--------------+  PFV       Full                                                             +---------+---------------+---------+-----------+----------+--------------+  POP       Full            No        Yes                                    +---------+---------------+---------+-----------+----------+--------------+  PTV       Full                                                             +---------+---------------+---------+-----------+----------+--------------+  PERO      Full                                                             +---------+---------------+---------+-----------+----------+--------------+  +---------+---------------+---------+-----------+----------+--------------+  LEFT      Compressibility Phasicity Spontaneity Properties Thrombus Aging  +---------+---------------+---------+-----------+----------+--------------+  CFV       Full            No        Yes                                    +---------+---------------+---------+-----------+----------+--------------+  SFJ       Full                                                             +---------+---------------+---------+-----------+----------+--------------+  FV Prox   Full                                                             +---------+---------------+---------+-----------+----------+--------------+  FV Mid    Full                                                             +---------+---------------+---------+-----------+----------+--------------+  FV Distal Full                                                              +---------+---------------+---------+-----------+----------+--------------+  PFV       Full                                                             +---------+---------------+---------+-----------+----------+--------------+  POP       Full            No        Yes                                    +---------+---------------+---------+-----------+----------+--------------+  PTV       Full                                                             +---------+---------------+---------+-----------+----------+--------------+  PERO      Full                                                             +---------+---------------+---------+-----------+----------+--------------+  Summary: Right: There is no evidence of deep vein thrombosis in the lower extremity. No cystic structure found in the popliteal fossa. Left: There is no evidence of deep vein thrombosis in the lower extremity. No cystic structure found in the popliteal fossa.  Bilateral lower extremity venous flow is pulsatile, suggestive of possible elevated right heart pressure. *See table(s) above for measurements and observations. Electronically signed by Deitra Mayo MD on 11/08/2018 at 4:56:45 PM.    Final      Assessment/Plan: ecoli bacteremia with pyelonephritis = treat as complicated bacteremia with pyelonephritis. Treat for 10 days using 10/23 as day 1  will narrow abtx since do not believe that she has septic emboli. Can narrow to ampicillin iv and  switch to orals after evaluation for swallow from stroke.  Will sign off.  [facility] for Infectious Diseases Cell: [phone] Pager: [phone]  11/09/2018, 4:54 PM

## 2018-11-09 NOTE — Progress Notes (Signed)
NAME:  Aimee Parker, MRN:  [id number], DOB:  [birth year], LOS: 8 ADMISSION DATE:  11/01/2018, CONSULTATION DATE: 11/04/2018 REFERRING MD: Dr. Marva Panda, CHIEF COMPLAINT: Sepsis  Brief History   68 y.o. F with PMH of DM and HTN who presents with 2-3 days of fever, malaise and fatigue.  She was found to have probable bilateral PNA and UTI in the ED with lactic acidosis and required Bipap, therefore PCCM was consulted.  Developed worsening respiratory distress with increased work of breathing and accessory muscle use requiring endotracheal intubation 11/02/2018     History of present illness   68 y.o. F coming from home with PMH of DM and HTN who c/o fever, malaise, fatigue and some abdominal pain. She has been in her normal state of health until onset of symptoms and initially presented to urgent care, then to the ED and was initially febrile to 103F with tachycardia and eventually oxygen sats dropped in the 80%'s and pt was placed on Bipap.  In the ED, labs were significant for lactic acid of 4.3, WBC 17k, CO2 19 with gap of 15, creatinine 1.67 (baseline ~1.0) troponin 334 and UA significant for leuks and bacteria and +nitrite positive. She received 4L IVF, cefepime and vancomycin.  CT chest notable for bilateral basilar infiltrates and ground glass opacities. She was initially admitted to internal medicine and as BP was down-trending and she was requiring Bipap, PCCM was consulted.  On initial evaluation, pt is awake and responsive and denies any complaints.  Interviewed with translation assistance from family [facility] interpreter on the way).  She denies any chest pain, current abdominal pain, recent URI symptoms, diarrhea or other complaints.  No recent UTI's or hospitalizations.   Past Medical History  Vitamin D deficiency Hypertension Type 2 diabetes  Significant Hospital Events   10/20 Admit to [facility] 10/21 Worsening respiratory distress requiring intubation  Consults:  Neurology  Stroke  Neuro Cardiology   Procedures:  ETT 10/21 >>  A-line 10/21 >> Left IJ CVC 10/21 >>   Significant Diagnostic Tests:  10/20 CT chest >> Patchy airspace areas of consolidation in the posterior lower lungs with ground-glass opacities in the upper lungs   10/20 CT abd/pelvis >> Heterogeneous left renal nephrogram with heterogenous striated appearance consistent with pyelonephritis, moderate left perinephritic edema and mild left ureteral prominence. Wall thickening of splenic flexure of colon likely due to renal inflammation   10/25 CT head>> 10 x 15 mm hypodensity left posterior cerebellum. Extensive low density body and splenium of corpus callosum.  10/25 MRI brain>>Restricted diffusion, swelling and edema within the callosal body and splenium as well as left cingulate gyrus. Additional punctate focus of restricted diffusion and edema within the left globus pallidus. Findings may reflect acute ischemic infarcts. However, toxic/metabolic etiologies should also be considered. Acute left cerebellar infarct. Bilateral mastoid effusions, large on the right. Fluid signal also present within the right eustachian tube.  10/28 TEE-normal LV RV function.  No significant valvular abnormality.  No evidence of endocarditis.  No obvious cardioembolic source. Micro Data:  10/20 Sars-CoV-2 >> neg 10/20 Blood culture >> Ecoli  10/20 UC  >>multiple species present, suggest recollection, FINAL.  10/21 RVP  >> neg 10/22 Sputum Culture >>  Antimicrobials:  Vancomycin 10/20 >10/21. 10/25>> 10/27 Cefepime 10/20 Ceftriaxone 10/21>>10/25 Acyclovir 10/25>> 10/27 Ampicillin 10/25>> 10/27   Interim history/subjective:  Patient awake and alert with no discomfort.  Tolerated transesophageal echo.  Objective   Blood pressure 117/66, pulse 92, temperature 100.3 F (37.9 C),  temperature source Oral, resp. rate 18, height 5\' 4"  (1.626 m), weight 68.4 kg, SpO2 98 %.    Vent Mode: PCV FiO2 (%):  [40 %] 40 %  Set Rate:  [18 bmp] 18 bmp PEEP:  [5 cmH20] 5 cmH20 Plateau Pressure:  [7 cmH20-16 cmH20] 7 cmH20   Intake/Output Summary (Last 24 hours) at 11/09/2018 1441 Last data filed at 11/09/2018 1300 Gross per 24 hour  Intake 1464.79 ml  Output 2610 ml  Net -1145.21 ml   Filed Weights   11/06/18 0500 11/07/18 0500 11/09/18 0500  Weight: 70.2 kg 69.3 kg 68.4 kg    Examination: Ambulatory General: Elderly, Well-developed, well nourished. NAD HENT: Normocephalic, PERRL. Moist mucus membranes Neck: No JVD. Trachea midline. No thyromegaly, no lymphadenopathy CV: RRR. S1S2. No MRG. +2 distal pulses Lungs: BBS present, faint rhonchi. No ventilator dyssynchrony.  Strong cough. ABD: +BS x4. SNT/ND. No masses, guarding or rigidity GU: Foley EXT: Waves with both hands to commands. Trace edema b/l upper and lower extremities Skin: PWD. In tact. No rashes or lesions Neuro: Alert to verbal. Waves with both hands to commands, able to wiggle toes on both feet, unable to hold legs up. Blinks eyes to command.   Resolved Hospital Problem list    Assessment & Plan:  Critically ill due to acute hypoxic respiratory failure requiring mechanical ventilation Patient able to tolerate SBT yesterday but remains intubated until following TEE (will be performed 10/28) Increased oxygen requirements following TEE. -Diurese to facilitate extubation post TEE. -SBTonce recovered from TEE  Was critically ill due to sepsis secondary to E. coli bacteremia now off vasopressors.   Pyelonephritis likely source as mild hydronephrosis on ultrasound with no abscess. -Simplify antibiotics to ceftriaxone alone.  Neuro imaging consistent with embolic stroke. No sign of endocarditis, cardioembolic source or patent foramen ovale. -Consider long-term anticoagulation and outpatient A. fib monitoring   Best practice:  Diet: Continue tube feeds  Pain/Anxiety/Delirium protocol (if indicated): Fentanyl drip VAP protocol (if  indicated): In place DVT prophylaxis: SCDs, UFH GI prophylaxis: PPI Glucose control: transition to SSI with Lantus  Mobility: Bedrest Code Status: Full code Family Communication: Daughter has been updated by Dr Wynona Neatlalere. Disposition: ICU  Labs   CBC: Recent Labs  Lab 11/04/18 0449  11/05/18 0459 11/06/18 0432 11/07/18 1139 11/08/18 0209 11/09/18 0513  WBC 19.8*  --  12.8* 12.2* 14.0* 12.0* 14.6*  NEUTROABS 17.0*  --   --  7.6  --   --   --   HGB 8.3*   < > 8.8* 8.4* 8.7* 7.8* 8.0*  HCT 25.4*   < > 26.0* 25.2* 26.6* 23.5* 25.3*  MCV 68.6*  --  65.2* 65.5* 66.3* 67.1* 68.4*  PLT 48*  --  58* 74* 123* 140* 224   < > = values in this interval not displayed.    Basic Metabolic Panel: Recent Labs  Lab 11/04/18 1720  11/05/18 0459 11/05/18 1725 11/06/18 0432 11/06/18 2000 11/07/18 0336 11/08/18 0209 11/09/18 0513  NA  --    < > 145  --  140  --  136 137 136  K  --    < > 3.2*  --  3.3*  --  3.2* 4.1 4.4  CL  --    < > 107  --  102  --  97* 102 95*  CO2  --    < > 27  --  28  --  28 28 31   GLUCOSE  --    < >  163*  --  185*  --  175* 204* 193*  BUN  --    < > 27*  --  17  --  17 16 20   CREATININE  --    < > 1.16*  --  0.79  --  0.64 0.63 0.82  CALCIUM  --    < > 8.6*  --  8.0*  --  7.9* 7.6* 8.5*  MG 2.0  --  1.6* 1.4* 1.4* 2.3 2.0 1.7 1.7  PHOS 1.5*  --  1.6* 2.8  --   --   --  3.8 3.6   < > = values in this interval not displayed.   GFR: Estimated Creatinine Clearance: 62.4 mL/min (by C-G formula based on SCr of 0.82 mg/dL). Recent Labs  Lab 11/02/18 1815 11/02/18 2158 11/03/18 0520  11/06/18 0432 11/07/18 1139 11/08/18 0209 11/09/18 0513  WBC  --   --  13.6*   < > 12.2* 14.0* 12.0* 14.6*  LATICACIDVEN 4.1* 3.7* 2.6*  --   --   --   --   --    < > = values in this interval not displayed.    Liver Function Tests: Recent Labs  Lab 11/08/18 0209 11/09/18 0513  ALBUMIN 1.7* 2.2*   No results for input(s): LIPASE, AMYLASE in the last 168 hours. Recent  Labs  Lab 11/06/18 1100  AMMONIA 19    ABG    Component Value Date/Time   PHART 7.334 (L) 11/04/2018 0841   PCO2ART 27.1 (L) 11/04/2018 0841   PO2ART 79.0 (L) 11/04/2018 0841   HCO3 14.4 (L) 11/04/2018 0841   TCO2 15 (L) 11/04/2018 0841   ACIDBASEDEF 10.0 (H) 11/04/2018 0841   O2SAT 95.0 11/04/2018 0841       CBG: Recent Labs  Lab 11/08/18 2037 11/08/18 2314 11/09/18 0409 11/09/18 0810 11/09/18 1143  GLUCAP 193* 227* 192* 137* 124*    CRITICAL CARE Performed by: 11/11/18   Total critical care time: 40 minutes  Critical care time was exclusive of separately billable procedures and treating other patients.  Critical care was necessary to treat or prevent imminent or life-threatening deterioration.  Critical care was time spent personally by me on the following activities: development of treatment plan with patient and/or surrogate as well as nursing, discussions with consultants, evaluation of patient's response to treatment, examination of patient, obtaining history from patient or surrogate, ordering and performing treatments and interventions, ordering and review of laboratory studies, ordering and review of radiographic studies, pulse oximetry, re-evaluation of patient's condition and participation in multidisciplinary rounds.  Lynnell Catalan, MD [facility] ICU Physician [facility] [place] [facility]  Pager: [phone] Mobile: [phone] After hours: [phone].

## 2018-11-09 NOTE — Progress Notes (Signed)
  Echocardiogram Echocardiogram Transesophageal has been performed.  Burnett Kanaris 11/09/2018, 2:35 PM

## 2018-11-09 NOTE — Progress Notes (Addendum)
[facility] Progress Note Patient Name: Aimee Parker DOB: 29-Jul-1950 MRN: [id number]   Date of Service  11/09/2018  HPI/Events of Note  Notified of tachyarrhythmia.  On video assessment, pt was going from sinus tachycardia, to atrial fibrillation, with bursts of SVT.  EKG reveals atrial fibrillation. BP 146/117, HR 120s, 30s, 100% O2 sats on the vent.  eICU Interventions  Give amiodarone bolus and start on infusion.  Start on heparin gtt.  Give lopressor 5mg  IV PRN for HR>130s. Check electrolytes and replete as needed.  Check troponin.     Intervention Category Intermediate Interventions: Arrhythmia - evaluation and management  Elsie Lincoln 11/09/2018, 8:33 PM

## 2018-11-09 NOTE — Progress Notes (Signed)
[facility] Progress Note Patient Name: Aimee Parker DOB: [birth year] MRN: [id number]   Date of Service  11/09/2018  HPI/Events of Note  Pt is on fentanyl gtt but patient seems uncomfortable on the vent.   eICU Interventions  Start on precedex gtt.      Intervention Category Minor Interventions: Agitation / anxiety - evaluation and management  Elsie Lincoln 11/09/2018, 9:24 PM

## 2018-11-09 NOTE — CV Procedure (Addendum)
    TRANSESOPHAGEAL ECHOCARDIOGRAM   NAME:  Aimee Parker   MRN: [id number] DOB:  1950-05-13   ADMIT DATE: 11/01/2018  INDICATIONS: Bacteremia/ CVA  PROCEDURE:   Informed consent was obtained prior to the procedure. The risks, benefits and alternatives for the procedure were discussed and the patient comprehended these risks.  Risks include, but are not limited to, cough, sore throat, vomiting, nausea, somnolence, esophageal and stomach trauma or perforation, bleeding, low blood pressure, aspiration, pneumonia, infection, trauma to the teeth and death.    During this procedure anesthesia is administered by Dr Lynetta Mare.  The transesophageal probe was inserted in the esophagus and stomach without difficulty and multiple views were obtained.    COMPLICATIONS:    There were no immediate complications.  FINDINGS:  LEFT VENTRICLE: EF = 55-60%. Septal dyssynchrony consistent with LBBB  RIGHT VENTRICLE: Normal size and function.   LEFT ATRIUM: No thrombus/mass.  LEFT ATRIAL APPENDAGE: No thrombus/mass.   RIGHT ATRIUM: No thrombus/mass.  AORTIC VALVE:  Trileaflet. No regurgitation. No vegetation.  MITRAL VALVE:    Normal structure. Trace regurgitation. No vegetation.  TRICUSPID VALVE: Normal structure. No regurgitation. No vegetation.  PULMONIC VALVE: Grossly normal structure. No regurgitation. No apparent vegetation.  INTERATRIAL SEPTUM: No PFO or ASD seen by color Doppler.  PERICARDIUM: No effusion noted.  DESCENDING AORTA: Normal size   CONCLUSION: No vegetation, mass, thrombus, or shunt seen  Oswaldo Milian MD [facility]  [address] [place], [place] [address] [phone]   3:31 PM

## 2018-11-09 NOTE — Progress Notes (Signed)
@  1900 pt. Started becoming very agitated, which is very different than what she has been like all day. Interpreter called and it was understood the patient needed to go on the bedpan to have a bowel movement. A few minutes later the patient began to act even more agitated, moving about aggressively in the bed and patting her stomach aggressively. RT and another RN at bedside. Pt. tachy in the 130s and tachypneic in the 40s. Per RT patient was getting adequate volumes on the ventilator and peak pressures were in the 20s so no neb was given d/t symptoms and high heart rate.   @ about 2000 pt. was seen was a HR in the 170s-180s. Pt. does not appear to be in any distress. Multiple RNs at bedside, RT present, and elink MD present giving orders. EKG completed.

## 2018-11-09 NOTE — Progress Notes (Signed)
STROKE TEAM PROGRESS NOTE      INTERVAL HISTORY  She is awake and now able to follow  commands consistently. Her daughter is  at the bedside.  TEE done today was negative for clot, masses or vegetations  Lower extremity venous Dopplers were negative for DVT.  She remains intubated for respiratory failure OBJECTIVE Vitals:   11/09/18 1000 11/09/18 1030 11/09/18 1128 11/09/18 1200  BP: (!) 110/56 137/68 117/66   Pulse: 85 89 92   Resp: 18 18 18    Temp:    100.3 F (37.9 C)  TempSrc:    Oral  SpO2: 97% 98% 98%   Weight:      Height:        CBC:  Recent Labs  Lab 11/04/18 0449  11/06/18 0432  11/08/18 0209 11/09/18 0513  WBC 19.8*   < > 12.2*   < > 12.0* 14.6*  NEUTROABS 17.0*  --  7.6  --   --   --   HGB 8.3*   < > 8.4*   < > 7.8* 8.0*  HCT 25.4*   < > 25.2*   < > 23.5* 25.3*  MCV 68.6*   < > 65.5*   < > 67.1* 68.4*  PLT 48*   < > 74*   < > 140* 224   < > = values in this interval not displayed.    Basic Metabolic Panel:  Recent Labs  Lab 11/08/18 0209 11/09/18 0513  NA 137 136  K 4.1 4.4  CL 102 95*  CO2 28 31  GLUCOSE 204* 193*  BUN 16 20  CREATININE 0.63 0.82  CALCIUM 7.6* 8.5*  MG 1.7 1.7  PHOS 3.8 3.6    Lipid Panel:     Component Value Date/Time   CHOL 141 11/07/2018 1139   CHOL 174 08/30/2018 1056   TRIG 132 11/07/2018 1139   HDL 18 (L) 11/07/2018 1139   HDL 53 08/30/2018 1056   CHOLHDL 7.8 11/07/2018 1139   VLDL 26 11/07/2018 1139   LDLCALC 97 11/07/2018 1139   LDLCALC 91 08/30/2018 1056   HgbA1c:  Lab Results  Component Value Date   HGBA1C 8.1 (H) 11/02/2018   Urine Drug Screen: No results found for: LABOPIA, COCAINSCRNUR, LABBENZ, AMPHETMU, THCU, LABBARB  Alcohol Level No results found for: ETH  IMAGING   Ct Angio Head W Or Wo Contrast  Result Date: 11/08/2018 CLINICAL DATA:  Stroke follow-up EXAM: CT ANGIOGRAPHY HEAD AND NECK TECHNIQUE: Multidetector CT imaging of the head and neck was performed using the standard protocol during  bolus administration of intravenous contrast. Multiplanar CT image reconstructions and MIPs were obtained to evaluate the vascular anatomy. Carotid stenosis measurements (when applicable) are obtained utilizing NASCET criteria, using the distal internal carotid diameter as the denominator. CONTRAST:  37m OMNIPAQUE IOHEXOL 350 MG/ML SOLN COMPARISON:  Brain MRI 11/06/2018 FINDINGS: CT HEAD FINDINGS Brain: There is no mass, hemorrhage or extra-axial collection. The size and configuration of the ventricles and extra-axial CSF spaces are normal. Hypoattenuation left cerebellum at the site of known infarct. Bilateral cingulate gyri infarcts are also noted. Skull: The visualized skull base, calvarium and extracranial soft tissues are normal. Sinuses/Orbits: Right mastoid effusion. The orbits are normal. CTA NECK FINDINGS SKELETON: There is no bony spinal canal stenosis. No lytic or blastic lesion. OTHER NECK: Normal pharynx, larynx and major salivary glands. No cervical lymphadenopathy. Unremarkable thyroid gland. UPPER CHEST: No pneumothorax or pleural effusion. No nodules or masses. AORTIC ARCH: There is mild  calcific atherosclerosis of the aortic arch. There is no aneurysm, dissection or hemodynamically significant stenosis of the visualized portion of the aorta. Conventional 3 vessel aortic branching pattern. The visualized proximal subclavian arteries are widely patent. RIGHT CAROTID SYSTEM: No dissection, occlusion or aneurysm. Mild atherosclerotic calcification at the carotid bifurcation without hemodynamically significant stenosis. LEFT CAROTID SYSTEM: No dissection, occlusion or aneurysm. Mild atherosclerotic calcification at the carotid bifurcation without hemodynamically significant stenosis. VERTEBRAL ARTERIES: Left dominant configuration. Both origins are clearly patent. There is no dissection, occlusion or flow-limiting stenosis to the skull base (V1-V3 segments). CTA HEAD FINDINGS POSTERIOR CIRCULATION:  --Vertebral arteries: There is atherosclerotic calcification of the left V4 segment causing short segment severe stenosis. --Posterior inferior cerebellar arteries (PICA): Patent origins from the vertebral arteries. --Anterior inferior cerebellar arteries (AICA): Patent origins from the basilar artery. --Basilar artery: There is multifocal atherosclerotic irregularity of the basilar artery --Superior cerebellar arteries: Patent proximally. --Posterior cerebral arteries: There is a short segment occlusion of the left posterior cerebral artery P2 segment. The distal PCA is patent. There is a partial origin of the right PCA from the P-comm. ANTERIOR CIRCULATION: --Intracranial internal carotid arteries: Atherosclerotic calcification of the internal carotid arteries at the skull base without hemodynamically significant stenosis. --Anterior cerebral arteries (ACA): The right A1 segment is diminutive or absent. The left A1 segment is patent. The left A2 segment is occluded proximally. Right A2 segment is patent. --Middle cerebral arteries (MCA): Normal. VENOUS SINUSES: As permitted by contrast timing, patent. ANATOMIC VARIANTS: None Review of the MIP images confirms the above findings. IMPRESSION: 1. 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 2. Short segment occlusion of the proximal left posterior cerebral artery P2 segment. 3. Short segment severe stenosis of the left vertebral artery V4 segment. 4. No hemodynamically significant stenosis in the neck. 5. Small left cerebellar infarct. 6. Aortic Atherosclerosis (ICD10-I70.0). Electronically Signed   By: Ulyses Jarred M.D.   On: 11/08/2018 00:34   Ct Angio Neck W Or Wo Contrast  Result Date: 11/08/2018 CLINICAL DATA:  Stroke follow-up EXAM: CT ANGIOGRAPHY HEAD AND NECK TECHNIQUE: Multidetector CT imaging of the head and neck was performed using the standard protocol during bolus  administration of intravenous contrast. Multiplanar CT image reconstructions and MIPs were obtained to evaluate the vascular anatomy. Carotid stenosis measurements (when applicable) are obtained utilizing NASCET criteria, using the distal internal carotid diameter as the denominator. CONTRAST:  9m OMNIPAQUE IOHEXOL 350 MG/ML SOLN COMPARISON:  Brain MRI 11/06/2018 FINDINGS: CT HEAD FINDINGS Brain: There is no mass, hemorrhage or extra-axial collection. The size and configuration of the ventricles and extra-axial CSF spaces are normal. Hypoattenuation left cerebellum at the site of known infarct. Bilateral cingulate gyri infarcts are also noted. Skull: The visualized skull base, calvarium and extracranial soft tissues are normal. Sinuses/Orbits: Right mastoid effusion. The orbits are normal. CTA NECK FINDINGS SKELETON: There is no bony spinal canal stenosis. No lytic or blastic lesion. OTHER NECK: Normal pharynx, larynx and major salivary glands. No cervical lymphadenopathy. Unremarkable thyroid gland. UPPER CHEST: No pneumothorax or pleural effusion. No nodules or masses. AORTIC ARCH: There is mild calcific atherosclerosis of the aortic arch. There is no aneurysm, dissection or hemodynamically significant stenosis of the visualized portion of the aorta. Conventional 3 vessel aortic branching pattern. The visualized proximal subclavian arteries are widely patent. RIGHT CAROTID SYSTEM: No dissection, occlusion or aneurysm. Mild atherosclerotic calcification at the carotid bifurcation without hemodynamically significant stenosis. LEFT CAROTID SYSTEM: No  dissection, occlusion or aneurysm. Mild atherosclerotic calcification at the carotid bifurcation without hemodynamically significant stenosis. VERTEBRAL ARTERIES: Left dominant configuration. Both origins are clearly patent. There is no dissection, occlusion or flow-limiting stenosis to the skull base (V1-V3 segments). CTA HEAD FINDINGS POSTERIOR CIRCULATION:  --Vertebral arteries: There is atherosclerotic calcification of the left V4 segment causing short segment severe stenosis. --Posterior inferior cerebellar arteries (PICA): Patent origins from the vertebral arteries. --Anterior inferior cerebellar arteries (AICA): Patent origins from the basilar artery. --Basilar artery: There is multifocal atherosclerotic irregularity of the basilar artery --Superior cerebellar arteries: Patent proximally. --Posterior cerebral arteries: There is a short segment occlusion of the left posterior cerebral artery P2 segment. The distal PCA is patent. There is a partial origin of the right PCA from the P-comm. ANTERIOR CIRCULATION: --Intracranial internal carotid arteries: Atherosclerotic calcification of the internal carotid arteries at the skull base without hemodynamically significant stenosis. --Anterior cerebral arteries (ACA): The right A1 segment is diminutive or absent. The left A1 segment is patent. The left A2 segment is occluded proximally. Right A2 segment is patent. --Middle cerebral arteries (MCA): Normal. VENOUS SINUSES: As permitted by contrast timing, patent. ANATOMIC VARIANTS: None Review of the MIP images confirms the above findings. IMPRESSION: 1. 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 2. Short segment occlusion of the proximal left posterior cerebral artery P2 segment. 3. Short segment severe stenosis of the left vertebral artery V4 segment. 4. No hemodynamically significant stenosis in the neck. 5. Small left cerebellar infarct. 6. Aortic Atherosclerosis (ICD10-I70.0). Electronically Signed   By: Ulyses Jarred M.D.   On: 11/08/2018 00:34   Vas [place] Lower Extremity Venous (dvt)  Result Date: 11/08/2018  Lower Venous Study Indications: Swelling.  Comparison Study: No prior study. Performing Technologist: Maudry Mayhew MHA, RDMS, RVT, RDCS  Examination Guidelines: A complete  evaluation includes B-mode imaging, spectral Doppler, color Doppler, and power Doppler as needed of all accessible portions of each vessel. Bilateral testing is considered an integral part of a complete examination. Limited examinations for reoccurring indications may be performed as noted.  +---------+---------------+---------+-----------+----------+--------------+ RIGHT    CompressibilityPhasicitySpontaneityPropertiesThrombus Aging +---------+---------------+---------+-----------+----------+--------------+ CFV      Full           No       Yes                                 +---------+---------------+---------+-----------+----------+--------------+ SFJ      Full                                                        +---------+---------------+---------+-----------+----------+--------------+ FV Prox  Full                                                        +---------+---------------+---------+-----------+----------+--------------+ FV Mid   Full                                                        +---------+---------------+---------+-----------+----------+--------------+  FV DistalFull                                                        +---------+---------------+---------+-----------+----------+--------------+ PFV      Full                                                        +---------+---------------+---------+-----------+----------+--------------+ POP      Full           No       Yes                                 +---------+---------------+---------+-----------+----------+--------------+ PTV      Full                                                        +---------+---------------+---------+-----------+----------+--------------+ PERO     Full                                                        +---------+---------------+---------+-----------+----------+--------------+   +---------+---------------+---------+-----------+----------+--------------+ LEFT     CompressibilityPhasicitySpontaneityPropertiesThrombus Aging +---------+---------------+---------+-----------+----------+--------------+ CFV      Full           No       Yes                                 +---------+---------------+---------+-----------+----------+--------------+ SFJ      Full                                                        +---------+---------------+---------+-----------+----------+--------------+ FV Prox  Full                                                        +---------+---------------+---------+-----------+----------+--------------+ FV Mid   Full                                                        +---------+---------------+---------+-----------+----------+--------------+ FV DistalFull                                                        +---------+---------------+---------+-----------+----------+--------------+  PFV      Full                                                        +---------+---------------+---------+-----------+----------+--------------+ POP      Full           No       Yes                                 +---------+---------------+---------+-----------+----------+--------------+ PTV      Full                                                        +---------+---------------+---------+-----------+----------+--------------+ PERO     Full                                                        +---------+---------------+---------+-----------+----------+--------------+  Summary: Right: There is no evidence of deep vein thrombosis in the lower extremity. No cystic structure found in the popliteal fossa. Left: There is no evidence of deep vein thrombosis in the lower extremity. No cystic structure found in the popliteal fossa.  Bilateral lower extremity venous flow is pulsatile, suggestive of possible elevated right  heart pressure. *See table(s) above for measurements and observations. Electronically signed by Deitra Mayo MD on 11/08/2018 at 4:56:45 PM.    Final      Transthoracic Echocardiogram   Recent Results (from the past 43800 hour(s))  ECHO TEE   Collection Time: 11/09/18  2:35 PM   Narrative     TRANSESOPHOGEAL ECHO REPORT       Patient Name:   Aimee Parker Date of Exam: 11/09/2018 Medical Rec #:  [id number]      Height:       64.0 in Accession #:    [id number]     Weight:       150.8 lb Date of Birth:  [birth year]      BSA:          1.74 m Patient Age:    39 years       BP:           128/63 mmHg Patient Gender: F              HR:           90 bpm. Exam Location:  Inpatient    Procedure: Transesophageal Echo  Indications:     Bacteremia 790.7   History:         Patient has prior history of Echocardiogram examinations, most                  recent 11/02/2018. Stroke Signs/Symptoms:Murmur Risk                  Factors:Hypertension, Diabetes and Non-Smoker. Sepsis.   Sonographer:     Paulita Fujita RDCS Referring Phys:  [id number] Enc[place] METZGER-CIHELKA Diagnosing Phys: Oswaldo Milian MD  PROCEDURE: Patients was monitored while under deep sedation. The transesophogeal probe was passed through the esophogus of the patient. The patient developed no complications during the procedure.  IMPRESSIONS    1. No vegetation seen  2. No left atrial appendage thrombus  3. Left ventricular ejection fraction, by visual estimation, is 55 to 60%. The left ventricle has normal function. Normal left ventricular size. There is moderately increased left ventricular hypertrophy.  4. Abnormal septal motion consistent with left bundle branch block.  5. Global right ventricle has normal systolic function.The right ventricular size is normal. No increase in right ventricular wall thickness.  6. Left atrial size was moderately dilated.  7. Right atrial size was normal.  8. The mitral  valve is normal in structure. Trace mitral valve regurgitation.  9. The tricuspid valve is normal in structure. Tricuspid valve regurgitation is not demonstrated. 10. The aortic valve is tricuspid. Aortic valve regurgitation is not visualized. 11. The pulmonic valve was not well visualized. Pulmonic valve regurgitation is not visualized. 12. The atrial septum is grossly normal.  FINDINGS  Left Ventricle: Left ventricular ejection fraction, by visual estimation, is 55 to 60%. The left ventricle has normal function. There is moderately increased left ventricular hypertrophy. Normal left ventricular size. Abnormal (paradoxical) septal  motion, consistent with left bundle branch block.  Right Ventricle: The right ventricular size is normal. No increase in right ventricular wall thickness. Global RV systolic function is has normal systolic function.  Left Atrium: Left atrial size was moderately dilated.  Right Atrium: Right atrial size was normal in size  Pericardium: There is no evidence of pericardial effusion.  Mitral Valve: The mitral valve is normal in structure. Trace mitral valve regurgitation.  Tricuspid Valve: The tricuspid valve is normal in structure. Tricuspid valve regurgitation is not demonstrated.  Aortic Valve: The aortic valve is tricuspid. Aortic valve regurgitation is not visualized.  Pulmonic Valve: The pulmonic valve was not well visualized. Pulmonic valve regurgitation is not visualized.  Aorta: The aortic root and ascending aorta are structurally normal, with no evidence of dilitation.  Shunts: The atrial septum is grossly normal.    Oswaldo Milian MD Electronically signed by Oswaldo Milian MD Signature Date/Time: 11/09/2018/3:33:40 PM       Final    ECHOCARDIOGRAM COMPLETE   Collection Time: 11/02/18  8:17 AM  Result Value   Weight 2,405.66   Height 64   BP 113/58   Narrative     ECHOCARDIOGRAM REPORT       Patient Name:   Aimee Parker  Date of Exam: 11/02/2018 Medical Rec #:  [id number]      Height:       64.0 in Accession #:    [id number]     Weight:       150.4 lb Date of Birth:  10-02-1950      BSA:          1.73 m Patient Age:    30 years       BP:           113/58 mmHg Patient Gender: F              HR:           114 bpm. Exam Location:  Inpatient  Procedure: 2D Echo, Color Doppler and Cardiac Doppler  STAT ECHO  Indications:    R06.9 DOE   History:        Patient has prior history of Echocardiogram examinations, most  recent 07/20/2013.   Sonographer:    Raquel Sarna Senior RDCS Referring Phys: [place]    Sonographer Comments: Patient supine on CPAP with respiration rate of 45. IMPRESSIONS    1. Left ventricular ejection fraction, by visual estimation, is 60 to 65%. The left ventricle has normal function. There is moderately increased left ventricular hypertrophy.  2. Left ventricular diastolic function could not be evaluated pattern of LV diastolic filling.  3. Global right ventricle has hyperdynamic systolic function.The right ventricular size is normal. No increase in right ventricular wall thickness.  4. Left atrial size was moderately dilated.  5. Right atrial size was normal.  6. Presence of pericardial fat pad.  7. Trivial pericardial effusion is present.  8. Moderate mitral annular calcification.  9. The mitral valve is grossly normal. Trace mitral valve regurgitation. 10. The tricuspid valve is normal in structure. Tricuspid valve regurgitation is trivial. 11. The aortic valve is grossly normal Aortic valve regurgitation was not visualized by color flow Doppler. Mild aortic valve sclerosis without stenosis. 12. The pulmonic valve was not well visualized. Pulmonic valve regurgitation is not visualized by color flow Doppler. 13. Mildly elevated pulmonary artery systolic pressure. 14. Difficult study, with very limited parasternal windows. Appears to have normal EF, no gross wall  motion abnormalities but limited sensitivity. RV is normal to small and hyperdynamic. LV has thickened basal septum but no clear obstruction seen.  Tachycardic in the 110s throughout study. 15. Cannot exclude small left to righ shunt through PFO (image 83).  FINDINGS  Left Ventricle: Left ventricular ejection fraction, by visual estimation, is 60 to 65%. The left ventricle has normal function. No evidence of left ventricular regional wall motion abnormalities. There is moderately increased left ventricular  hypertrophy. Concentric left ventricular hypertrophy. Spectral Doppler shows Left ventricular diastolic function could not be evaluated pattern of LV diastolic filling.  Right Ventricle: The right ventricular size is normal. No increase in right ventricular wall thickness. Global RV systolic function is has hyperdynamic systolic function. The tricuspid regurgitant velocity is 2.36 m/s, and with an assumed right atrial  pressure of 15 mmHg, the estimated right ventricular systolic pressure is mildly elevated at 37.3 mmHg.  Left Atrium: Left atrial size was moderately dilated.  Right Atrium: Right atrial size was normal in size  Pericardium: Trivial pericardial effusion is present. Presence of pericardial fat pad.  Mitral Valve: The mitral valve is grossly normal. Moderate mitral annular calcification. MV peak gradient, 11.3 mmHg. Trace mitral valve regurgitation.  Tricuspid Valve: The tricuspid valve is normal in structure. Tricuspid valve regurgitation is trivial by color flow Doppler.  Aortic Valve: The aortic valve is grossly normal. Aortic valve regurgitation was not visualized by color flow Doppler. Mild aortic valve sclerosis is present, with no evidence of aortic valve stenosis. Aortic valve mean gradient measures 8.0 mmHg. Aortic  valve peak gradient measures 11.8 mmHg. Aortic valve area, by VTI measures 2.20 cm.  Pulmonic Valve: The pulmonic valve was not well visualized. Pulmonic  valve regurgitation is not visualized by color flow Doppler.  Aorta: The aortic root and ascending aorta are structurally normal, with no evidence of dilitation.  Pulmonary Artery: The pulmonary artery is not well seen.  IAS/Shunts: Cannot exclude small left to righ shunt through PFO (image 83).     LEFT VENTRICLE PLAX 2D LVIDd:         4.00 cm LVIDs:         2.70 cm LV PW:  1.50 cm LV IVS:        1.70 cm LVOT diam:     2.00 cm LV SV:         43 ml LV SV Index:   24.30 LVOT Area:     3.14 cm   LV Volumes (MOD) LV area d, A2C:    28.50 cm LV area d, A4C:    24.80 cm LV area s, A2C:    17.80 cm LV area s, A4C:    17.00 cm LV major d, A2C:   8.24 cm LV major d, A4C:   7.88 cm LV major s, A2C:   7.18 cm LV major s, A4C:   7.04 cm LV vol d, MOD A2C: 83.0 ml LV vol d, MOD A4C: 64.7 ml LV vol s, MOD A2C: 38.6 ml LV vol s, MOD A4C: 35.4 ml LV SV MOD A2C:     44.4 ml LV SV MOD A4C:     64.7 ml LV SV MOD BP:      37.5 ml  RIGHT VENTRICLE RV S prime:     13.10 cm/s TAPSE (M-mode): 2.5 cm  LEFT ATRIUM             Index       RIGHT ATRIUM          Index LA diam:        4.40 cm 2.54 cm/m  RA Area:     9.96 cm LA Vol (A2C):   32.1 ml 18.52 ml/m RA Volume:   20.90 ml 12.06 ml/m LA Vol (A4C):   69.4 ml 40.05 ml/m LA Biplane Vol: 50.1 ml 28.91 ml/m  AORTIC VALVE AV Area (Vmax):    1.94 cm AV Area (Vmean):   1.71 cm AV Area (VTI):     2.20 cm AV Vmax:           172.00 cm/s AV Vmean:          134.000 cm/s AV VTI:            0.243 m AV Peak Grad:      11.8 mmHg AV Mean Grad:      8.0 mmHg LVOT Vmax:         106.00 cm/s LVOT Vmean:        73.100 cm/s LVOT VTI:          0.170 m LVOT/AV VTI ratio: 0.70   AORTA Ao Root diam: 2.50 cm Ao Asc diam:  2.80 cm  MITRAL VALVE             TRICUSPID VALVE MV Peak grad: 11.3 mmHg  TR Peak grad:   22.3 mmHg MV Mean grad: 6.0 mmHg   TR Vmax:        236.00 cm/s MV Vmax:      1.68 m/s MV Vmean:     117.0 cm/s  SHUNTS MV VTI:       0.24 m     Systemic VTI:  0.17 m                          Systemic Diam: 2.00 cm    Buford Dresser MD Electronically signed by Buford Dresser MD Signature Date/Time: 11/02/2018/8:45:07 AM       Final     *Note: Due to a large number of results and/or encounters for the requested time period, some results have not been displayed. A complete set of results can be found in Results  Review.   ECG - SR rate 90-110 BPM. (See cardiology reading for complete details)  PHYSICAL EXAM Blood pressure 117/66, pulse 92, temperature 100.3 F (37.9 C), temperature source Oral, resp. rate 18, height 5' 4"  (1.626 m), weight 68.4 kg, SpO2 98 %.  HEENT-  Normocephalic, no lesions, without obvious abnormality.  Normal external eye and conjunctiva.  Neck is stiff in flexion but not rotation.  Cardiovascular-, pulses palpable throughout   Lungs- Intubated, vented Extremities- Developing SQ edema Musculoskeletal-no joint tenderness, deformity or swelling Skin-warm and dry, no hyperpigmentation, vitiligo, or suspicious lesions  Mental Status: Patient intubated, no propofol,   Awake and alert.  Will follow simple commands in all 4 extremities only when spoken in [place] Cough and gag reflex intact, aroused to light touch and verbal stimuli. Patient was able to wiggle her toes on BLE. Able to squeeze hands bilaterally and wiggle fingers as well as stick out her tongue on command (in [place] only).  CN: Patient does not track well, did not cross midline, but seems to attemtp, left gaze deviation noted. Appears to stare off behind examiner and daughter. Seems to be a decreased blink to threat on right. PERRL sluggish.  Face flaccidly symmetric around ETT.   Motor: Diffusely weak throughout. BUE able to grip hands 4-/5 and wiggle fingers.  BLE able to wiggle toes, but unable to break gravity. There was brief break gravity with left arm.     Sensory: able to localize to  noxious. Plantars: Right: Briskly downgoing                Left: Briskly upgoing Cerebellar/Gait: Unable to perform  HOME MEDICATIONS:  Medications Prior to Admission  Medication Sig Dispense Refill  . Accu-Chek Softclix Lancets lancets Use as instructed. Check blood glucose levels twice per day by fingerstick 200 each 3  . amLODipine (NORVASC) 10 MG tablet Take 1 tablet (10 mg total) by mouth daily. 90 tablet 3  . atorvastatin (LIPITOR) 40 MG tablet Take 1 tablet (40 mg total) by mouth daily. 90 tablet 0  . Blood Glucose Monitoring Suppl (ACCU-CHEK AVIVA PLUS) w/Device KIT 1 each by Does not apply route 3 (three) times daily. METER STOPPED WORKING 1 kit 0  . glimepiride (AMARYL) 2 MG tablet Take 1 tablet (2 mg total) by mouth daily before breakfast. 90 tablet 1  . glucose blood (ACCU-CHEK AVIVA) test strip Use as instructed. Check blood glucose levels twice per day by fingerstick 200 each 3  . glucose blood test strip Provide patient with insurance approved strips. Use as instructed. Inject into the skin once daily. 100 each 12  . hydrochlorothiazide (HYDRODIURIL) 25 MG tablet Take 1 tablet (25 mg total) by mouth daily. 90 tablet 3  . lisinopril (ZESTRIL) 40 MG tablet Take 1 tablet (40 mg total) by mouth daily. 90 tablet 3  . sitaGLIPtin-metformin (JANUMET) 50-1000 MG tablet Take 1 tablet by mouth 2 (two) times daily with a meal. 180 tablet 0  . Vitamin D, Ergocalciferol, (DRISDOL) 1.25 MG (50000 UT) CAPS capsule Take 1 capsule (50,000 Units total) by mouth every 7 (seven) days. 12 capsule 3      HOSPITAL MEDICATIONS:  . aspirin  81 mg Oral Daily  . chlorhexidine gluconate (MEDLINE KIT)  15 mL Mouth Rinse BID  . Chlorhexidine Gluconate Cloth  6 each Topical Daily  . famotidine  20 mg Per Tube Daily  . feeding supplement (PRO-STAT SUGAR FREE 64)  30 mL Per Tube BID  . furosemide  40 mg Intravenous BID  . heparin injection (subcutaneous)  5,000 Units Subcutaneous Q8H  . insulin aspart   0-15 Units Subcutaneous Q4H  . insulin glargine  20 Units Subcutaneous Q24H  . mouth rinse  15 mL Mouth Rinse 10 times per day  . sodium chloride flush  3 mL Intravenous Q12H    ALLERGIES No Known Allergies  ASSESSMENT/PLAN Aimee Parker is a 68 y.o. female with history of DM and HTN who presented to hospital with AMS. There was a c/o of 2-3  day history of fever, fatigue and some abdominal pain. MRI showed multiple areas of stroke in the callosal body, splenium and left gyrus, left globus pallidus. This is concerning for septic emboli given her fevers and +bld cx.   Multiple strokes involving corpus callosum body, splenium, left basal ganglia likely embolic, possibly septic emboli versus paradoxical embolism.  Resultant  AMS, right side weak, left gaze  Code Stroke CT Head -   Not done  CT head - L cerebellar hypodensity  MRI head- multiple areas of stroke in the callosal body, splenium and left gyrus, left globus pallidus.  MRA head   CTA H&N   CT Perfusion  Carotid Doppler -  2D Echo - 60% EF, LA dilation, ?L->R PFO  [employer] Virus 2  neg  LDL - 97    Component Value Date/Time   LDLCALC 97 11/07/2018 1139   LDLCALC 91 08/30/2018 1056    HgbA1c - 8.1  UDS not done  VTE prophylaxis - SCDs Diet  Diet Order            Diet NPO time specified  Diet effective now              none prior to admission, now on ASA  unalbe to be counseled to be compliant with her antithrombotic medications at this time  Ongoing aggressive stroke risk factor management  Therapy recommendations:  pending  Disposition:  Pending  Hypertension  Home BP meds: Norvasc, Zestril  Current BP meds: none  Stable, currently running low . Permissive hypertension (OK if < 220/120) but gradually normalize in 5-7 days . Long-term BP goal normotensive  Hyperlipidemia  Home Lipid lowering medication: Lipitor 40  LDL 97, goal < 70  Current lipid lowering medication:  Lipitor 40 mg daily  Continue statin at discharge  Diabetes  Home diabetic meds: Amaryl, Janumet  Current diabetic meds: SSI  HgbA1c 8.1, goal < 7.0 Recent Labs    11/09/18 0409 11/09/18 0810 11/09/18 1143  GLUCAP 192* 137* 124*    Other Stroke Risk Factors  Advanced age  Cigarette smoker; when able will be advised to stop smoking   Other Active Problems  Pneumonia  E.Coli Bacteremia (+blood cx)- abx started, will need ID consulted  Fevers- normalizing  AKI- monitoring labs  Acute respiratory failure- intubated, vent support  Encephalopathy- AMS multi-factoral d/t stroke, infection, toxic-metabolic. Improving slowly  Hospital day # 8   She presented with pneumonia and respiratory difficulty and was found to be septic with E. coli bacteremia and MRI shows multiple cortical and subcortical embolic infarcts of cryptogenic etiology.  TEE was negative for vegetations or PFO.  Recommend aspirin and Plavix for 3 weeks followed by aspirin alone.  Extubate as tolerated as per critical care team long discussion with  Dr. Lynetta Mare critical care MD and answered questions This patient is critically ill and at significant risk of neurological worsening, death and care requires constant monitoring of vital signs,  hemodynamics,respiratory and cardiac monitoring, extensive review of multiple databases, frequent neurological assessment, discussion with family, other specialists and medical decision making of high complexity.I have made any additions or clarifications directly to the above note.This critical care time does not reflect procedure time, or teaching time or supervisory time of PA/NP/Med Resident etc but could involve care discussion time.  I spent 30 minutes of neurocritical care time  in the care of  this patient. Stroke team will sign off.  Kindly call for questions.  Discussed with Dr. Fayne Mediate, [place] Pager:  [phone] 11/09/2018 3:46 PM To contact Stroke Continuity provider, please refer to [email or web address] After hours, contact General Neurology

## 2018-11-10 DIAGNOSIS — N390 Urinary tract infection, site not specified: Secondary | ICD-10-CM | POA: Diagnosis present

## 2018-11-10 DIAGNOSIS — N12 Tubulo-interstitial nephritis, not specified as acute or chronic: Secondary | ICD-10-CM | POA: Diagnosis not present

## 2018-11-10 DIAGNOSIS — J9601 Acute respiratory failure with hypoxia: Secondary | ICD-10-CM | POA: Diagnosis not present

## 2018-11-10 DIAGNOSIS — A419 Sepsis, unspecified organism: Secondary | ICD-10-CM | POA: Diagnosis not present

## 2018-11-10 DIAGNOSIS — I6340 Cerebral infarction due to embolism of unspecified cerebral artery: Secondary | ICD-10-CM | POA: Diagnosis not present

## 2018-11-10 DIAGNOSIS — B9620 Unspecified Escherichia coli [E. coli] as the cause of diseases classified elsewhere: Secondary | ICD-10-CM | POA: Diagnosis not present

## 2018-11-10 DIAGNOSIS — R7881 Bacteremia: Secondary | ICD-10-CM | POA: Diagnosis not present

## 2018-11-10 LAB — RENAL FUNCTION PANEL
Albumin: 2.3 g/dL — ABNORMAL LOW (ref 3.5–5.0)
Anion gap: 14 (ref 5–15)
BUN: 35 mg/dL — ABNORMAL HIGH (ref 8–23)
CO2: 31 mmol/L (ref 22–32)
Calcium: 8.6 mg/dL — ABNORMAL LOW (ref 8.9–10.3)
Chloride: 92 mmol/L — ABNORMAL LOW (ref 98–111)
Creatinine, Ser: 1 mg/dL (ref 0.44–1.00)
GFR calc Af Amer: >60 mL/min (ref >60)
GFR calc non Af Amer: 58 mL/min — ABNORMAL LOW (ref >60)
Glucose, Bld: 239 mg/dL — ABNORMAL HIGH (ref 70–99)
Phosphorus: 5.4 mg/dL — ABNORMAL HIGH (ref 2.5–4.6)
Potassium: 3.9 mmol/L (ref 3.5–5.1)
Sodium: 137 mmol/L (ref 135–145)

## 2018-11-10 LAB — MAGNESIUM: Magnesium: 1.8 mg/dL (ref 1.7–2.4)

## 2018-11-10 LAB — CBC
HCT: 25.5 % — ABNORMAL LOW (ref 36.0–46.0)
Hemoglobin: 8 g/dL — ABNORMAL LOW (ref 12.0–15.0)
MCH: 21.9 pg — ABNORMAL LOW (ref 26.0–34.0)
MCHC: 31.4 g/dL (ref 30.0–36.0)
MCV: 69.7 fL — ABNORMAL LOW (ref 80.0–100.0)
Platelets: 276 10*3/uL (ref 150–400)
RBC: 3.66 MIL/uL — ABNORMAL LOW (ref 3.87–5.11)
RDW: 14.2 % (ref 11.5–15.5)
WBC: 16.8 10*3/uL — ABNORMAL HIGH (ref 4.0–10.5)
nRBC: 0.1 % (ref 0.0–0.2)

## 2018-11-10 LAB — GLUCOSE, CAPILLARY
Glucose-Capillary: 237 mg/dL — ABNORMAL HIGH (ref 70–99)
Glucose-Capillary: 156 mg/dL — ABNORMAL HIGH (ref 70–99)
Glucose-Capillary: 130 mg/dL — ABNORMAL HIGH (ref 70–99)
Glucose-Capillary: 124 mg/dL — ABNORMAL HIGH (ref 70–99)
Glucose-Capillary: 143 mg/dL — ABNORMAL HIGH (ref 70–99)
Glucose-Capillary: 163 mg/dL — ABNORMAL HIGH (ref 70–99)

## 2018-11-10 LAB — HEPARIN LEVEL (UNFRACTIONATED)
Heparin Unfractionated: 0.36 IU/mL (ref 0.30–0.70)
Heparin Unfractionated: 0.42 IU/mL (ref 0.30–0.70)

## 2018-11-10 LAB — POCT I-STAT 7, (LYTES, BLD GAS, ICA,H+H)
Acid-Base Excess: 13 mmol/L — ABNORMAL HIGH (ref 0.0–2.0)
Bicarbonate: 37.7 mmol/L — ABNORMAL HIGH (ref 20.0–28.0)
Calcium, Ion: 1.14 mmol/L — ABNORMAL LOW (ref 1.15–1.40)
HCT: 29 % — ABNORMAL LOW (ref 36.0–46.0)
Hemoglobin: 9.9 g/dL — ABNORMAL LOW (ref 12.0–15.0)
O2 Saturation: 100 %
Patient temperature: 99.1
Potassium: 4 mmol/L (ref 3.5–5.1)
Sodium: 136 mmol/L (ref 135–145)
TCO2: 39 mmol/L — ABNORMAL HIGH (ref 22–32)
pCO2 arterial: 48.9 mmHg — ABNORMAL HIGH (ref 32.0–48.0)
pH, Arterial: 7.496 — ABNORMAL HIGH (ref 7.350–7.450)
pO2, Arterial: 172 mmHg — ABNORMAL HIGH (ref 83.0–108.0)

## 2018-11-10 LAB — TROPONIN I (HIGH SENSITIVITY): Troponin I (High Sensitivity): 72 ng/L — ABNORMAL HIGH (ref <18)

## 2018-11-10 MED ORDER — SODIUM CHLORIDE 0.9 % IV SOLN: 2.0000 g | INTRAVENOUS | Status: DC

## 2018-11-10 MED ORDER — DEXAMETHASONE SODIUM PHOSPHATE 4 MG/ML IJ SOLN
4.0000 mg | Freq: Four times a day (QID) | INTRAMUSCULAR | Status: AC
  Administered 2018-11-10 – 2018-11-11 (×4): 4 mg via INTRAVENOUS
  Filled 2018-11-10 (×4): qty 1

## 2018-11-10 MED ORDER — DEXAMETHASONE SODIUM PHOSPHATE 10 MG/ML IJ SOLN
10.0000 mg | Freq: Once | INTRAMUSCULAR | Status: AC
  Administered 2018-11-10: 10 mg via INTRAVENOUS
  Filled 2018-11-10: qty 1

## 2018-11-10 MED ORDER — METOPROLOL TARTRATE 50 MG PO TABS
50.0000 mg | ORAL_TABLET | Freq: Two times a day (BID) | ORAL | Status: DC
  Administered 2018-11-10 – 2018-11-14 (×5): 50 mg via ORAL
  Filled 2018-11-10 (×6): qty 1

## 2018-11-10 MED ORDER — CEFAZOLIN SODIUM-DEXTROSE 2-4 GM/100ML-% IV SOLN
2.0000 g | Freq: Three times a day (TID) | INTRAVENOUS | Status: DC
  Administered 2018-11-11 – 2018-11-13 (×7): 2 g via INTRAVENOUS
  Filled 2018-11-10 (×7): qty 100

## 2018-11-10 MED ORDER — RACEPINEPHRINE HCL 2.25 % IN NEBU
0.5000 mL | INHALATION_SOLUTION | RESPIRATORY_TRACT | Status: DC | PRN
  Administered 2018-11-10 (×3): 0.5 mL via RESPIRATORY_TRACT
  Filled 2018-11-10 (×4): qty 0.5

## 2018-11-10 NOTE — Procedures (Signed)
Extubation Procedure Note  Patient Details:   Name: Aimee Parker DOB: 07-20-1950 MRN: [id number]   Airway Documentation:    Vent end date: 11/10/18 Vent end time: 1018   Evaluation  O2 sats: stable throughout Complications: Complications of stridor heard. Patient did tolerate procedure well. Bilateral Breath Sounds: Rhonchi   Yes   Pt extubated per MD CCM order. Pt's cuff leak heard prior to extubation. RN and RT at bedside. Pt placed on 6L [place] with bubble humidifier. Complications include a slight stridor sound heard on inspiration. Racepinephrine Neb given. RN notified. Rt will continue to monitor patient for any distress. Vitals are stable at this time. HR 66 SATs 100% RR 22.  Esperanza Sheets T 11/10/2018, 10:25 AM

## 2018-11-10 NOTE — Progress Notes (Signed)
Blood gas results given to RN. 

## 2018-11-10 NOTE — Progress Notes (Signed)
[facility] for Infectious Disease    Date of Admission:  11/01/2018   Total days of antibiotics 10 ceftriaxone           ID: Aimee Parker is a 68 y.o. 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 Extubated now wearing bipap - TEE no veg in the last 48hrs  ROS; unable to get since she is on bipap  Medications:  . aspirin  81 mg Oral Daily  . chlorhexidine gluconate (MEDLINE KIT)  15 mL Mouth Rinse BID  . Chlorhexidine Gluconate Cloth  6 each Topical Daily  . famotidine  20 mg Per Tube Daily  . insulin aspart  0-15 Units Subcutaneous Q4H  . insulin glargine  20 Units Subcutaneous Q24H  . mouth rinse  15 mL Mouth Rinse 10 times per day  . metoprolol tartrate  50 mg Oral BID  . sodium chloride flush  3 mL Intravenous Q12H    Objective: Vital signs in last 24 hours: Temp:  [97.9 F (36.6 C)-99.1 F (37.3 C)] 97.9 F (36.6 C) (10/29 1600) Pulse Rate:  [19-117] 69 (10/29 1619) Resp:  [14-32] 20 (10/29 1619) BP: (92-190)/(50-137) 139/69 (10/29 1619) SpO2:  [91 %-100 %] 100 % (10/29 1619) FiO2 (%):  [40 %-50 %] 40 % (10/29 1619) Physical Exam  Constitutional:  Opens eyes to verbal command. appears well-developed and well-nourished. No distress. Wearing bipap HENT: [place]/AT, PERRLA, no scleral icterus Mouth/Throat: Oropharynx is clear and moist. No oropharyngeal exudate.  Cardiovascular: Normal rate, regular rhythm and normal heart sounds. Exam reveals no gallop and no friction rub.  No murmur heard.  Pulmonary/Chest: Effort normal and breath sounds normal. No respiratory distress.  has no wheezes.  Abdominal: Soft. Bowel  sounds are normal.  exhibits no distension. There is no tenderness.  Ext: moving extremities spontaneously somewhat Skin: Skin is warm and dry. No rash noted. No erythema.  Psychiatric: a normal mood and affect.  behavior is normal.   Lab Results Recent Labs    11/09/18 0513 11/09/18 2010 11/10/18 0200 11/10/18 1311  WBC 14.6*  --  16.8*  --   HGB 8.0*  --  8.0* 9.9*  HCT 25.3*  --  25.5* 29.0*  NA 136 137 137 136  K 4.4 4.0 3.9 4.0  CL 95* 90* 92*  --   CO2 '31 29 31  '$ --   BUN 20 30* 35*  --   CREATININE 0.82 0.96 1.00  --    Liver Panel Recent Labs    11/09/18 0513 11/10/18 0200  ALBUMIN 2.2* 2.3*    Microbiology: 10/20 blood cx ecoli Studies/Results: No results found.   Assessment/Plan:  ecoli bacteremia with pyelonephritis = treat as complicated bacteremia with pyelonephritis. Treat for 10 days using 10/23 as day 1  will narrow abtx since do not believe that she has septic emboli. Can narrow to cefazolin 2gm IV Q8hr and switch to orals after evaluation for swallow from stroke.  No need for follow up . [facility] Infectious [facility] Cell: [phone] Pager: [phone]  11/10/2018, 4:57 PM

## 2018-11-10 NOTE — Progress Notes (Signed)
Results for JENIFFER, CULLIVER [facility] (MRN [id number]) as of 11/10/2018 09:09  Ref. Range 11/09/2018 15:43 11/09/2018 20:09 11/09/2018 21:38 11/10/2018 02:07 11/10/2018 06:25  Glucose-Capillary Latest Ref Range: 70 - 99 mg/dL 121 (H) 222 (H) 254 (H) 237 (H) 156 (H)  Noted that CBGs have been trending greater than 180 mg/dl.  Recommend adding Novolog 3 units every 4 hours while patient on continuous tube feedings.  Discontinue while tube feedings are held for any reason.   Harvel Ricks RN BSN CDE Diabetes Coordinator Pager: [phone]  8am-5pm

## 2018-11-10 NOTE — Progress Notes (Signed)
136mL fentanyl wasted with Karene Fry RN

## 2018-11-10 NOTE — Progress Notes (Signed)
NAME:  Aimee Parker, MRN:  [id number], DOB:  [birth year], LOS: 9 ADMISSION DATE:  11/01/2018, CONSULTATION DATE: 11/04/2018 REFERRING MD: Dr. Mcarthur Rossetti, CHIEF COMPLAINT: Sepsis  Brief History   68 y.o. F with PMH of DM and HTN who presents with 2-3 days of fever, malaise and fatigue.  She was found to have probable bilateral PNA and UTI in the ED with lactic acidosis and required Bipap, therefore PCCM was consulted.  Developed worsening respiratory distress with increased work of breathing and accessory muscle use requiring endotracheal intubation 11/02/2018     History of present illness   68 y.o. F coming from home with PMH of DM and HTN who c/o fever, malaise, fatigue and some abdominal pain. She has been in her normal state of health until onset of symptoms and initially presented to urgent care, then to the ED and was initially febrile to 103F with tachycardia and eventually oxygen sats dropped in the 80%'s and pt was placed on Bipap.  In the ED, labs were significant for lactic acid of 4.3, WBC 17k, CO2 19 with gap of 15, creatinine 1.67 (baseline ~1.0) troponin 334 and UA significant for leuks and bacteria and +nitrite positive. She received 4L IVF, cefepime and vancomycin.  CT chest notable for bilateral basilar infiltrates and ground glass opacities. She was initially admitted to internal medicine and as BP was down-trending and she was requiring Bipap, PCCM was consulted.  On initial evaluation, pt is awake and responsive and denies any complaints.  Interviewed with translation assistance from family [facility] interpreter on the way).  She denies any chest pain, current abdominal pain, recent URI symptoms, diarrhea or other complaints.  No recent UTI's or hospitalizations.   Past Medical History  Vitamin D deficiency Hypertension Type 2 diabetes  Significant Hospital Events   10/20 Admit to [facility] 10/21 Worsening respiratory distress requiring intubation  Consults:  Neurology  Stroke  Neuro Cardiology   Procedures:  ETT 10/21 >>  A-line 10/21 >> Left IJ CVC 10/21 >>   Significant Diagnostic Tests:  10/20 CT chest >> Patchy airspace areas of consolidation in the posterior lower lungs with ground-glass opacities in the upper lungs   10/20 CT abd/pelvis >> Heterogeneous left renal nephrogram with heterogenous striated appearance consistent with pyelonephritis, moderate left perinephritic edema and mild left ureteral prominence. Wall thickening of splenic flexure of colon likely due to renal inflammation   10/25 CT head>> 10 x 15 mm hypodensity left posterior cerebellum. Extensive low density body and splenium of corpus callosum.  10/25 MRI brain>>Restricted diffusion, swelling and edema within the callosal body and splenium as well as left cingulate gyrus. Additional punctate focus of restricted diffusion and edema within the left globus pallidus. Findings may reflect acute ischemic infarcts. However, toxic/metabolic etiologies should also be considered. Acute left cerebellar infarct. Bilateral mastoid effusions, large on the right. Fluid signal also present within the right eustachian tube.  10/28 TEE-normal LV RV function.  No significant valvular abnormality.  No evidence of endocarditis.  No obvious cardioembolic source. Micro Data:  10/20 Sars-CoV-2 >> neg 10/20 Blood culture >> Ecoli  10/20 UC  >>multiple species present, suggest recollection, FINAL.  10/21 RVP  >> neg 10/22 Sputum Culture >>  Antimicrobials:  Vancomycin 10/20 >10/21. 10/25>> 10/27 Cefepime 10/20 Ceftriaxone 10/21>>10/25 Acyclovir 10/25>> 10/27 Ampicillin 10/25>> 10/27   Interim history/subjective:  Patient awake and alert with no discomfort.  Afib overnight, converted with iv amiodarone.  Objective   Blood pressure 113/64, pulse 83, temperature 100.1  F (37.8 C), temperature source Oral, resp. rate 16, height 5\' 4"  (1.626 m), weight 68.4 kg, SpO2 97 %.    Vent Mode: PSV;CPAP  FiO2 (%):  [40 %-60 %] 40 % Set Rate:  [12 bmp-24 bmp] 18 bmp PEEP:  [5 cmH20-10 cmH20] 5 cmH20 Pressure Support:  [5 cmH20] 5 cmH20 Plateau Pressure:  [7 cmH20-19 cmH20] 13 cmH20   Intake/Output Summary (Last 24 hours) at 11/10/2018 0828 Last data filed at 11/10/2018 0700 Gross per 24 hour  Intake 1712.43 ml  Output 1050 ml  Net 662.43 ml   Filed Weights   11/06/18 0500 11/07/18 0500 11/09/18 0500  Weight: 70.2 kg 69.3 kg 68.4 kg    Examination: Ambulatory General: Elderly, Well-developed, well nourished. NAD HENT: Normocephalic, PERRL. Moist mucus membranes Neck: No JVD. Trachea midline. No thyromegaly, no lymphadenopathy CV: RRR. S1S2. No MRG. +2 distal pulses Lungs: chest clear. No ventilator dyssynchrony.  Strong cough. SBI 30 on 5/5 ABD: +BS x4. SNT/ND. No masses, guarding or rigidity GU: Foley EXT: Waves with both hands to commands. Trace edema b/l upper and lower extremities Skin: PWD. In tact. No rashes or lesions Neuro: Alert to verbal. Waves with both hands to commands, able to wiggle toes on both feet, unable to hold legs up. Blinks eyes to command.   Resolved Hospital Problem list    Assessment & Plan:  Critically ill due to acute hypoxic respiratory failure requiring mechanical ventilation -Extubate following 1h SBT  Critically ill due to atrial fibrillation with rapid ventricular response requiring amiodarone infusion.  Now in sinus rhythm - Complete 24h amiodarone infusion.  - Initiate beta-blocker for first line control   Was critically ill due to sepsis secondary to E. coli bacteremia now off vasopressors.   Pyelonephritis likely source as mild hydronephrosis on ultrasound with no abscess. -Simplify antibiotics to ceftriaxone alone.  Neuro imaging consistent with embolic stroke. Likely has paroxysmal AF as cause. No sign of endocarditis, cardioembolic source or patent foramen ovale. -Convert to oral anticoagulation once extubated.   Best  practice:  Diet: Continue tube feeds  Pain/Anxiety/Delirium protocol (if indicated): Fentanyl drip VAP protocol (if indicated): In place DVT prophylaxis: SCDs, UFH GI prophylaxis: PPI Glucose control: transition to SSI with Lantus  Mobility: Bedrest Code Status: Full code Family Communication: Daughter has been updated by Dr Wynona Neatlalere. Disposition: ICU  Labs   CBC: Recent Labs  Lab 11/04/18 0449  11/06/18 0432 11/07/18 1139 11/08/18 0209 11/09/18 0513 11/10/18 0200  WBC 19.8*   < > 12.2* 14.0* 12.0* 14.6* 16.8*  NEUTROABS 17.0*  --  7.6  --   --   --   --   HGB 8.3*   < > 8.4* 8.7* 7.8* 8.0* 8.0*  HCT 25.4*   < > 25.2* 26.6* 23.5* 25.3* 25.5*  MCV 68.6*   < > 65.5* 66.3* 67.1* 68.4* 69.7*  PLT 48*   < > 74* 123* 140* 224 276   < > = values in this interval not displayed.    Basic Metabolic Panel: Recent Labs  Lab 11/05/18 0459 11/05/18 1725  11/07/18 0336 11/08/18 0209 11/09/18 0513 11/09/18 2010 11/10/18 0200  NA 145  --    < > 136 137 136 137 137  K 3.2*  --    < > 3.2* 4.1 4.4 4.0 3.9  CL 107  --    < > 97* 102 95* 90* 92*  CO2 27  --    < > 28 28 31 29 31   GLUCOSE  163*  --    < > 175* 204* 193* 231* 239*  BUN 27*  --    < > 17 16 20  30* 35*  CREATININE 1.16*  --    < > 0.64 0.63 0.82 0.96 1.00  CALCIUM 8.6*  --    < > 7.9* 7.6* 8.5* 9.0 8.6*  MG 1.6* 1.4*   < > 2.0 1.7 1.7 1.9 1.8  PHOS 1.6* 2.8  --   --  3.8 3.6  --  5.4*   < > = values in this interval not displayed.   GFR: Estimated Creatinine Clearance: 51.2 mL/min (by C-G formula based on SCr of 1 mg/dL). Recent Labs  Lab 11/07/18 1139 11/08/18 0209 11/09/18 0513 11/10/18 0200  WBC 14.0* 12.0* 14.6* 16.8*    Liver Function Tests: Recent Labs  Lab 11/08/18 0209 11/09/18 0513 11/10/18 0200  ALBUMIN 1.7* 2.2* 2.3*   No results for input(s): LIPASE, AMYLASE in the last 168 hours. Recent Labs  Lab 11/06/18 1100  AMMONIA 19    ABG    Component Value Date/Time   PHART 7.334 (L)  11/04/2018 0841   PCO2ART 27.1 (L) 11/04/2018 0841   PO2ART 79.0 (L) 11/04/2018 0841   HCO3 14.4 (L) 11/04/2018 0841   TCO2 15 (L) 11/04/2018 0841   ACIDBASEDEF 10.0 (H) 11/04/2018 0841   O2SAT 95.0 11/04/2018 0841       CBG: Recent Labs  Lab 11/09/18 1543 11/09/18 2009 11/09/18 2138 11/10/18 0207 11/10/18 0625  GLUCAP 121* 222* 254* 237* 156*    CRITICAL CARE Performed by: Kipp Brood   Total critical care time: 40 minutes  Critical care time was exclusive of separately billable procedures and treating other patients.  Critical care was necessary to treat or prevent imminent or life-threatening deterioration.  Critical care was time spent personally by me on the following activities: development of treatment plan with patient and/or surrogate as well as nursing, discussions with consultants, evaluation of patient's response to treatment, examination of patient, obtaining history from patient or surrogate, ordering and performing treatments and interventions, ordering and review of laboratory studies, ordering and review of radiographic studies, pulse oximetry, re-evaluation of patient's condition and participation in multidisciplinary rounds.  Kipp Brood, MD [facility] ICU Physician [place]  Pager: [phone] Mobile: [phone] After hours: [phone].

## 2018-11-10 NOTE — Progress Notes (Signed)
ANTICOAGULATION CONSULT NOTE - Follow Up Consult  Pharmacy Consult for heparin Indication: atrial fibrillation and stroke  Labs: Recent Labs    11/07/18 1139  11/08/18 0209 11/09/18 0513 11/09/18 2010 11/10/18 0200  HGB 8.7*  --  7.8* 8.0*  --  8.0*  HCT 26.6*  --  23.5* 25.3*  --  25.5*  PLT 123*  --  140* 224  --  276  LABPROT 13.4  --   --   --   --   --   INR 1.0  --   --   --   --   --   HEPARINUNFRC  --   --   --   --   --  0.36  CREATININE  --    < > 0.63 0.82 0.96 1.00  TROPONINIHS  --   --   --   --  36* 72*   < > = values in this interval not displayed.    Assessment/Plan:  68yo female therapeutic on heparin with initial dosing for Afib in setting of stroke. Will continue gtt at current rate and confirm stable with additional level.   Wynona Neat, PharmD, BCPS  11/10/2018,4:04 AM

## 2018-11-10 NOTE — Progress Notes (Signed)
Pt has stridor since extubation around 10:20am, MD aware and steroids ordered. Pt stable and appears comfortable on BIPAP. Will continue to monitor.

## 2018-11-10 NOTE — Progress Notes (Signed)
ANTICOAGULATION CONSULT NOTE - Follow Up Consult  Pharmacy Consult for Heparin Indication: atrial fibrillation  No Known Allergies  Patient Measurements: Height: 5\' 4"  (162.6 cm) Weight: 150 lb 12.7 oz (68.4 kg) IBW/kg (Calculated) : 54.7 Heparin Dosing Weight: 68.4 kg  Vital Signs: BP: 130/65 (10/29 1106) Pulse Rate: 66 (10/29 1106)  Labs: Recent Labs    11/08/18 0209 11/09/18 0513 11/09/18 2010 11/10/18 0200 11/10/18 1045  HGB 7.8* 8.0*  --  8.0*  --   HCT 23.5* 25.3*  --  25.5*  --   PLT 140* 224  --  276  --   HEPARINUNFRC  --   --   --  0.36 0.42  CREATININE 0.63 0.82 0.96 1.00  --   TROPONINIHS  --   --  36* 72*  --     Estimated Creatinine Clearance: 51.2 mL/min (by C-G formula based on SCr of 1 mg/dL).   Assessment: 68 yr old female with new atrial fibrillation. She presented to the hospital on 11/01/18 with AMS. MRI showed multiple strokes involving corpus callosum body, splenium, left basal ganglia, likely embolic, possibly septic emboli vs paradoxical embolism.  Other medical problems include DM, HTN, HLD, E coli bacteremia/ pyelonephritis, pneumonia, acute respiratory failure (intubated) and AKI.  Hep lvl therapeutic  Patient on abx for pyelo now, not meningitis.   Goal of Therapy:  Heparin level 0.3-0.5 units/ml Monitor platelets by anticoagulation protocol: Yes   Plan:  Continue heparin 850 units/hr Daily HL Adjust CTx to q24h  Barth Kirks, PharmD, BCPS, BCCCP Clinical Pharmacist [phone]  Please check AMION for all [place] numbers  11/10/2018 12:07 PM

## 2018-11-11 ENCOUNTER — Inpatient Hospital Stay (HOSPITAL_COMMUNITY): Payer: Medicare Other

## 2018-11-11 DIAGNOSIS — A419 Sepsis, unspecified organism: Secondary | ICD-10-CM | POA: Diagnosis not present

## 2018-11-11 DIAGNOSIS — J9601 Acute respiratory failure with hypoxia: Secondary | ICD-10-CM | POA: Diagnosis not present

## 2018-11-11 LAB — HEPARIN LEVEL (UNFRACTIONATED): Heparin Unfractionated: 0.62 IU/mL (ref 0.30–0.70)

## 2018-11-11 LAB — CBC
HCT: 25.8 % — ABNORMAL LOW (ref 36.0–46.0)
Hemoglobin: 8.1 g/dL — ABNORMAL LOW (ref 12.0–15.0)
MCH: 21.6 pg — ABNORMAL LOW (ref 26.0–34.0)
MCHC: 31.4 g/dL (ref 30.0–36.0)
MCV: 68.8 fL — ABNORMAL LOW (ref 80.0–100.0)
Platelets: 356 10*3/uL (ref 150–400)
RBC: 3.75 MIL/uL — ABNORMAL LOW (ref 3.87–5.11)
RDW: 14.3 % (ref 11.5–15.5)
WBC: 15.4 10*3/uL — ABNORMAL HIGH (ref 4.0–10.5)
nRBC: 0 % (ref 0.0–0.2)

## 2018-11-11 LAB — GLUCOSE, CAPILLARY
Glucose-Capillary: 136 mg/dL — ABNORMAL HIGH (ref 70–99)
Glucose-Capillary: 142 mg/dL — ABNORMAL HIGH (ref 70–99)
Glucose-Capillary: 152 mg/dL — ABNORMAL HIGH (ref 70–99)
Glucose-Capillary: 189 mg/dL — ABNORMAL HIGH (ref 70–99)
Glucose-Capillary: 234 mg/dL — ABNORMAL HIGH (ref 70–99)
Glucose-Capillary: 240 mg/dL — ABNORMAL HIGH (ref 70–99)

## 2018-11-11 MED ORDER — SENNOSIDES 8.8 MG/5ML PO SYRP
5.0000 mL | ORAL_SOLUTION | Freq: Two times a day (BID) | ORAL | Status: DC
  Administered 2018-11-11 – 2018-11-14 (×4): 5 mL
  Filled 2018-11-11 (×7): qty 5

## 2018-11-11 MED ORDER — RESOURCE THICKENUP CLEAR PO POWD
ORAL | Status: DC | PRN
  Filled 2018-11-11: qty 125

## 2018-11-11 MED ORDER — POLYETHYLENE GLYCOL 3350 17 G PO PACK
17.0000 g | PACK | Freq: Every day | ORAL | Status: DC
  Administered 2018-11-11 – 2018-11-15 (×5): 17 g
  Filled 2018-11-11 (×5): qty 1

## 2018-11-11 MED ORDER — ENSURE ENLIVE PO LIQD
237.0000 mL | Freq: Three times a day (TID) | ORAL | Status: DC
  Administered 2018-11-11 – 2018-11-15 (×10): 237 mL via ORAL

## 2018-11-11 MED ORDER — ENOXAPARIN SODIUM 80 MG/0.8ML ~~LOC~~ SOLN
1.0000 mg/kg | Freq: Two times a day (BID) | SUBCUTANEOUS | Status: DC
  Administered 2018-11-11 – 2018-11-13 (×5): 70 mg via SUBCUTANEOUS
  Filled 2018-11-11 (×5): qty 0.8

## 2018-11-11 NOTE — Evaluation (Signed)
Speech Language Pathology Evaluation Patient Details Name: Aimee Parker MRN: [id number] DOB: 10/12/1950 Today's Date: 11/11/2018 Time: 1829-9371 SLP Time Calculation (min) (ACUTE ONLY): 35 min  Problem List:  Patient Active Problem List   Diagnosis Date Noted  . Urinary tract infection without hematuria   . E coli bacteremia 11/08/2018  . Cerebral embolism with cerebral infarction 11/07/2018  . Pneumonia   . Pyelonephritis   . Endotracheal tube present   . Septic shock (HCC)   . Sepsis (HCC) 11/01/2018  . Acute respiratory failure (HCC) 11/01/2018  . Endophthalmitis, acute 05/13/2017  . Healthcare maintenance 09/09/2016  . DM (diabetes mellitus) type 2, uncontrolled, with ketoacidosis (HCC) 05/17/2014  . Heart murmur 12/14/2013  . Cough 07/06/2013  . Hypertriglyceridemia 07/06/2013  . Type 2 diabetes mellitus without complication, with long-term current use of insulin (HCC) 07/06/2013  . Essential hypertension, benign 12/22/2012  . Hyperglycemia 11/17/2012  . Hypertension, uncontrolled 11/17/2012   Past Medical History:  Past Medical History:  Diagnosis Date  . Diabetes mellitus without complication (HCC)   . Hypertension   . Vitamin D deficiency    Past Surgical History:  Past Surgical History:  Procedure Laterality Date  . EYE SURGERY     Left eye surgery  . PARS PLANA VITRECTOMY Right 05/13/2017   Procedure: PARS PLANA VITRECTOMY 25 GAUGE FOR ENDOPHTHALMITIS;  Surgeon: Edmon Crape, MD;  Location: [facility] OR;  Service: Ophthalmology;  Laterality: Right;   HPI:  68 y.o.F with PMH of DM and HTN who presents with 2-3 days of fever, malaise and fatigue. She was found to have probable bilateral PNA and UTI. Developed worsening respiratory distress with increased work of breathing and accessory muscle use requiring endotracheal intubation 11/02/2018-10/29. Post extubation stridor, improved somewhat with steroids and epinephrine. Still present suggesting vocal cord  dysfunction per MD. MRI shows Restricted diffusion, swelling and edema within the callosal body.    Assessment / Plan / Recommendation Clinical Impression  Pt demonstrates no overt cognitive linguistic impairment; [employer] interpreter used as pts primarly language is [place] Pt followed 1-2 step commands, attended to both visual fields, able to read and identify single words in English, read the clock. Communicates at phrase length. Vocal quality is breathy and breathing is at times striderous making volume low. Interpreter reports language and articulation are otherwise good. Did not attempt higher cognitive tasks, but no indication at this time that pt will need f/u SLP therapy at next level of care. Will follow briefly for swallowing only.     SLP Assessment  SLP Recommendation/Assessment: Patient does not need any further Speech Lanaguage Pathology Services SLP Visit Diagnosis: Dysphagia, oropharyngeal phase (R13.12)    Follow Up Recommendations  Inpatient Rehab    Frequency and Duration min 2x/week         SLP Evaluation Cognition  Overall Cognitive Status: Within Functional Limits for tasks assessed Arousal/Alertness: Awake/alert Orientation Level: Oriented to person;Oriented to place;Oriented to situation;Disoriented to time Attention: Focused;Sustained;Selective Focused Attention: Appears intact Sustained Attention: Appears intact Selective Attention: Appears intact Memory: Appears intact Awareness: Appears intact       Comprehension  Auditory Comprehension Overall Auditory Comprehension: Appears within functional limits for tasks assessed Commands: Within Functional Limits    Expression Verbal Expression Overall Verbal Expression: Appears within functional limits for tasks assessed Initiation: No impairment Automatic Speech: Name;Social Response;Counting Level of Generative/Spontaneous Verbalization: Sentence;Conversation Repetition: No impairment Naming: No  impairment Pragmatics: No impairment   Oral / Motor  Oral Motor/Sensory Function Overall Oral Motor/Sensory  Function: Within functional limits Motor Speech Overall Motor Speech: Impaired Phonation: Hoarse;Breathy   GO                   Herbie Baltimore, MA CCC-SLP  Acute Rehabilitation Services Pager [phone] Office [phone]  Lynann Beaver 11/11/2018, 11:22 AM

## 2018-11-11 NOTE — Progress Notes (Addendum)
ANTICOAGULATION CONSULT NOTE - Follow Up Consult  Pharmacy Consult for Heparin Indication: atrial fibrillation  No Known Allergies  Patient Measurements: Height: 5\' 4"  (162.6 cm) Weight: 152 lb 1.9 oz (69 kg) IBW/kg (Calculated) : 54.7 Heparin Dosing Weight: 68.4 kg  Vital Signs: BP: 123/57 (10/30 0800) Pulse Rate: 65 (10/30 0800)  Labs: Recent Labs    11/09/18 0513 11/09/18 2010 11/10/18 0200 11/10/18 1045 11/10/18 1311 11/11/18 0554  HGB 8.0*  --  8.0*  --  9.9* 8.1*  HCT 25.3*  --  25.5*  --  29.0* 25.8*  PLT 224  --  276  --   --  356  HEPARINUNFRC  --   --  0.36 0.42  --  0.62  CREATININE 0.82 0.96 1.00  --   --   --   TROPONINIHS  --  36* 72*  --   --   --     Estimated Creatinine Clearance: 51.3 mL/min (by C-G formula based on SCr of 1 mg/dL).   Assessment: 68 yr old female with new atrial fibrillation. She presented to the hospital on 11/01/18 with AMS. MRI showed multiple strokes involving corpus callosum body, splenium, left basal ganglia, likely embolic, possibly septic emboli vs paradoxical embolism.  Other medical problems include DM, HTN, HLD, E coli bacteremia/ pyelonephritis, pneumonia, acute respiratory failure (intubated) and AKI.  Hep lvl high based on the low goal d/t stroke  CBC stable   Goal of Therapy:  Heparin level 0.3-0.5 units/ml Monitor platelets by anticoagulation protocol: Yes   Plan:  DC heparin  In 1 hr enox 1 mg/kg bid  Barth Kirks, PharmD, BCPS, BCCCP Clinical Pharmacist [phone]  Please check AMION for all [place] numbers  11/11/2018 8:35 AM

## 2018-11-11 NOTE — Evaluation (Signed)
Physical Therapy Evaluation Patient Details Name: Aimee Parker MRN: [id number] DOB: 1950/11/12 Today's Date: 11/11/2018   History of Present Illness  68 y.o. F with PMH of DM and HTN who presents with 2-3 days of fever, malaise and fatigue.  She was found to have probable bilateral PNA and UTI in the ED with lactic acidosis and required Bipap. Intubated 10/21-29, now on Bipap. QPY:[id number] diffusion, swelling and edema within the callosal bodyand splenium as well as left cingulate gyrus. Additional punctate focus of restricted diffusion and edema within the left globus pallidus. Findings may reflect acute ischemic infarcts. However,toxic/metabolic etiologies should also be considered. Acute left cerebellar infarct  Clinical Impression  PTA pt living with husband and daughter in one story home with 2 steps to enter, independent in mobility and iADLs working at [employer]. Pt is limited in safe mobility by neuromotor deficits resulting in L sided inattention and decreased strength,and balance. Pt requires total Ax2 for bed mobility and transfers. PT recommending CIR level rehab at discharge. PT will continue to follow acutely.     Follow Up Recommendations CIR    Equipment Recommendations  Other (comment)(TBD at next venue)    Recommendations for Other Services Rehab consult     Precautions / Restrictions Precautions Precautions: Fall Restrictions Weight Bearing Restrictions: No      Mobility  Bed Mobility Overal bed mobility: Needs Assistance Bed Mobility: Supine to Sit     Supine to sit: Total assist;+2 for physical assistance        Transfers Overall transfer level: Needs assistance   Transfers: Squat Pivot Transfers     Squat pivot transfers: Total assist;+2 physical assistance     General transfer comment: Use of bed pad and gait belt going to pt's left         Balance Overall balance assessment: Needs assistance Sitting-balance support: Bilateral upper  extremity supported;Feet supported Sitting balance-Leahy Scale: Poor Sitting balance - Comments: Pt with tendency to lean posteriorly when not supported and without balance reaction, pushing minimally with LUE while seated EOB   Standing balance support: Bilateral upper extremity supported Standing balance-Leahy Scale: Zero                               Pertinent Vitals/Pain Pain Assessment: No/denies pain    Home Living Family/patient expects to be discharged to:: Private residence Living Arrangements: Spouse/significant other;Children(husband and daughter) Available Help at Discharge: Family;Available 24 hours/day Type of Home: House Home Access: Stairs to enter Entrance Stairs-Rails: None Entrance Stairs-Number of Steps: 2 Home Layout: One level Home Equipment: None      Prior Function Level of Independence: Independent               Hand Dominance   Dominant Hand: Right    Extremity/Trunk Assessment   Upper Extremity Assessment Upper Extremity Assessment: RUE deficits/detail;LUE deficits/detail RUE Deficits / Details: unable to full raise arm on her own, full PROM RUE Coordination: decreased fine motor;decreased gross motor LUE Deficits / Details: unable to full raise arm on her own, full PROM LUE Coordination: decreased gross motor;decreased fine motor    Lower Extremity Assessment Lower Extremity Assessment: RLE deficits/detail;LLE deficits/detail;Difficult to assess due to impaired cognition RLE Deficits / Details: PROM full, difficulty with lifting from hip to move LE across bed RLE Sensation: WNL RLE Coordination: decreased fine motor;decreased gross motor LLE Deficits / Details: PROM full, difficulty with lifting from hip to move LE across  bed LLE Sensation: WNL LLE Coordination: decreased fine motor;decreased gross motor       Communication   Communication: Prefers language other than English(vietnamese)  Cognition Arousal/Alertness:  Awake/alert Behavior During Therapy: Flat affect Overall Cognitive Status: Impaired/Different from baseline                                 General Comments: Difficult to say if issues with cognition or just due to language barrier even with video interpreter service used. Appeared slow to process information at times      General Comments General comments (skin integrity, edema, etc.): VSS, on 4L O2 via [place]        Assessment/Plan    PT Assessment Patient needs continued PT services  PT Problem List Decreased strength;Decreased activity tolerance;Decreased mobility;Decreased balance;Decreased cognition;Decreased coordination       PT Treatment Interventions DME instruction;Gait training;Functional mobility training;Therapeutic activities;Therapeutic exercise;Balance training;Neuromuscular re-education;Cognitive remediation    PT Goals (Current goals can be found in the Care Plan section)  Acute Rehab PT Goals Patient Stated Goal: daughter would like for patient to come home PT Goal Formulation: With patient/family Time For Goal Achievement: 11/25/18 Potential to Achieve Goals: Good    Frequency Min 4X/week   Barriers to discharge        Co-evaluation PT/OT/SLP Co-Evaluation/Treatment: Yes Reason for Co-Treatment: Complexity of the patient's impairments (multi-system involvement);For patient/therapist safety PT goals addressed during session: Mobility/safety with mobility;Balance OT goals addressed during session: Strengthening/ROM       AM-PAC PT "6 Clicks" Mobility  Outcome Measure Help needed turning from your back to your side while in a flat bed without using bedrails?: Total Help needed moving from lying on your back to sitting on the side of a flat bed without using bedrails?: Total Help needed moving to and from a bed to a chair (including a wheelchair)?: Total Help needed standing up from a chair using your arms (e.g., wheelchair or bedside  chair)?: Total Help needed to walk in hospital room?: Total Help needed climbing 3-5 steps with a railing? : Total 6 Click Score: 6    End of Session Equipment Utilized During Treatment: Gait belt;Oxygen Activity Tolerance: Patient tolerated treatment well Patient left: in chair;with call bell/phone within reach;with chair alarm set;with family/visitor present Nurse Communication: Mobility status;Need for lift equipment PT Visit Diagnosis: Unsteadiness on feet (R26.81);Other abnormalities of gait and mobility (R26.89);Muscle weakness (generalized) (M62.81);Difficulty in walking, not elsewhere classified (R26.2);Other symptoms and signs involving the nervous system (J09.326)    Time: 7124-5809 PT Time Calculation (min) (ACUTE ONLY): 43 min   Charges:   PT Evaluation $PT Eval Moderate Complexity: 1 Mod          Malayjah Otoole B. Beverely Risen PT, DPT Acute Rehabilitation Services Pager [phone] Office [phone]   Elon Alas Fleet 11/11/2018, 12:56 PM

## 2018-11-11 NOTE — Progress Notes (Addendum)
RN called and stated pt had thrown up while on bipap. RT arrived in pts room bipap mask was off. RT not placing pt back on bipap, pt placed on 5L [place] at this time and seems comfortable, vital sign are stable. Concerns for possible aspiration but no rhonchi noted at this time.  RT will continue to monitor.

## 2018-11-11 NOTE — Progress Notes (Signed)
NAME:  Aimee Parker, MRN:  [id number], DOB:  05/11/1950, LOS: 10 ADMISSION DATE:  11/01/2018, CONSULTATION DATE: 11/04/2018 REFERRING MD: Dr. Mcarthur Rossetti, CHIEF COMPLAINT: Sepsis  Brief History   68 y.o. F with PMH of DM and HTN who presents with 2-3 days of fever, malaise and fatigue.  She was found to have probable bilateral PNA and UTI in the ED with lactic acidosis and required Bipap, therefore PCCM was consulted.  Developed worsening respiratory distress with increased work of breathing and accessory muscle use requiring endotracheal intubation 11/02/2018     History of present illness   68 y.o. F coming from home with PMH of DM and HTN who c/o fever, malaise, fatigue and some abdominal pain. She has been in her normal state of health until onset of symptoms and initially presented to urgent care, then to the ED and was initially febrile to 103F with tachycardia and eventually oxygen sats dropped in the 80%'s and pt was placed on Bipap.  In the ED, labs were significant for lactic acid of 4.3, WBC 17k, CO2 19 with gap of 15, creatinine 1.67 (baseline ~1.0) troponin 334 and UA significant for leuks and bacteria and +nitrite positive. She received 4L IVF, cefepime and vancomycin.  CT chest notable for bilateral basilar infiltrates and ground glass opacities. She was initially admitted to internal medicine and as BP was down-trending and she was requiring Bipap, PCCM was consulted.  On initial evaluation, pt is awake and responsive and denies any complaints.  Interviewed with translation assistance from family [facility] interpreter on the way).  She denies any chest pain, current abdominal pain, recent URI symptoms, diarrhea or other complaints.  No recent UTI's or hospitalizations.   Past Medical History  Vitamin D deficiency Hypertension Type 2 diabetes  Significant Hospital Events   10/20 Admit to [facility] 10/21 Worsening respiratory distress requiring intubation  Consults:  Neurology  Stroke  Neuro Cardiology   Procedures:  ETT 10/21 >>  A-line 10/21 >> Left IJ CVC 10/21 >>   Significant Diagnostic Tests:  10/20 CT chest >> Patchy airspace areas of consolidation in the posterior lower lungs with ground-glass opacities in the upper lungs   10/20 CT abd/pelvis >> Heterogeneous left renal nephrogram with heterogenous striated appearance consistent with pyelonephritis, moderate left perinephritic edema and mild left ureteral prominence. Wall thickening of splenic flexure of colon likely due to renal inflammation   10/25 CT head>> 10 x 15 mm hypodensity left posterior cerebellum. Extensive low density body and splenium of corpus callosum.  10/25 MRI brain>>Restricted diffusion, swelling and edema within the callosal body and splenium as well as left cingulate gyrus. Additional punctate focus of restricted diffusion and edema within the left globus pallidus. Findings may reflect acute ischemic infarcts. However, toxic/metabolic etiologies should also be considered. Acute left cerebellar infarct. Bilateral mastoid effusions, large on the right. Fluid signal also present within the right eustachian tube.  10/28 TEE-normal LV RV function.  No significant valvular abnormality.  No evidence of endocarditis.  No obvious cardioembolic source. Micro Data:  10/20 Sars-CoV-2 >> neg 10/20 Blood culture >> Ecoli  10/20 UC  >>multiple species present, suggest recollection, FINAL.  10/21 RVP  >> neg 10/22 Sputum Culture >>  Antimicrobials:  Vancomycin 10/20 >10/21. 10/25>> 10/27 Cefepime 10/20>>10/25 Ceftriaxone 10/21>>10/25 Acyclovir 10/25>> 10/27 Ampicillin 10/25>> 10/27 Cef  Interim history/subjective:  Patient awake and alert with no discomfort.  Afib overnight, converted with iv amiodarone.  Objective   Blood pressure (!) 123/57, pulse 65, temperature  98.8 F (37.1 C), temperature source Oral, resp. rate 16, height 5\' 4"  (1.626 m), weight 69 kg, SpO2 100 %.    Vent Mode:  BIPAP FiO2 (%):  [40 %-50 %] 40 % Set Rate:  [18 bmp] 18 bmp PEEP:  [8 cmH20] 8 cmH20   Intake/Output Summary (Last 24 hours) at 11/11/2018 0932 Last data filed at 11/11/2018 0546 Gross per 24 hour  Intake 1448.61 ml  Output 2800 ml  Net -1351.39 ml   Filed Weights   11/07/18 0500 11/09/18 0500 11/11/18 0500  Weight: 69.3 kg 68.4 kg 69 kg    Examination: Ambulatory General: Elderly, Well-developed, well nourished. NAD HENT: Normocephalic, PERRL. Moist mucus membranes Neck: No JVD. Trachea midline. No thyromegaly, no lymphadenopathy CV: RRR. S1S2. No MRG. +2 distal pulses Lungs: upper airway stridor.  Strong cough. SBI 30 on 5/5 ABD: +BS x4. SNT/ND. No masses, guarding or rigidity GU: Foley EXT: Waves with both hands to commands. Trace edema b/l upper and lower extremities Skin: PWD. In tact. No rashes or lesions Neuro: Alert to verbal. Waves with both hands to commands, able to wiggle toes on both feet, unable to hold legs up. Blinks eyes to command.   Resolved Hospital Problem list   Respiratory failure.  Assessment & Plan:   Was Critically ill due to acute hypoxic respiratory failure requiring mechanical ventilation Successfully extubated yesterday Post extubation stridor, improved somewhat with steroids and epinephrine. Still present suggesting vocal cord dysfunction. - Cortrak for feeding - SLP evaluation. - Remains at high risk for re-intubation.  Was Critically ill due to atrial fibrillation with rapid ventricular response requiring amiodarone infusion.  Now in sinus rhythm - Completed 24h amiodarone infusion.  - On beta-blocker for first line control   Was critically ill due to sepsis secondary to E. coli bacteremia now off vasopressors.   Pyelonephritis likely source as mild hydronephrosis on ultrasound with no abscess. -Simplify antibiotics to ceftriaxone alone.  Neuro imaging consistent with embolic stroke. Likely has paroxysmal AF as cause. No sign of  endocarditis, cardioembolic source or patent foramen ovale. -Convert to oral anticoagulation now that she is extubated extubated.   Best practice:  Diet: Continue tube feeds  Pain/Anxiety/Delirium protocol (if indicated): none VAP protocol (if indicated): In place DVT prophylaxis: SCDs, Lovenox treatment dose. GI prophylaxis: PPI Glucose control: transition to SSI with Lantus  Mobility: Bedrest Code Status: Full code Family Communication: Daughter has been updated by Dr Wynona Neatlalere. Disposition: ICU  Labs   CBC: Recent Labs  Lab 11/06/18 0432 11/07/18 1139 11/08/18 0209 11/09/18 0513 11/10/18 0200 11/10/18 1311 11/11/18 0554  WBC 12.2* 14.0* 12.0* 14.6* 16.8*  --  15.4*  NEUTROABS 7.6  --   --   --   --   --   --   HGB 8.4* 8.7* 7.8* 8.0* 8.0* 9.9* 8.1*  HCT 25.2* 26.6* 23.5* 25.3* 25.5* 29.0* 25.8*  MCV 65.5* 66.3* 67.1* 68.4* 69.7*  --  68.8*  PLT 74* 123* 140* 224 276  --  356    Basic Metabolic Panel: Recent Labs  Lab 11/05/18 0459 11/05/18 1725  11/07/18 0336 11/08/18 0209 11/09/18 0513 11/09/18 2010 11/10/18 0200 11/10/18 1311  NA 145  --    < > 136 137 136 137 137 136  K 3.2*  --    < > 3.2* 4.1 4.4 4.0 3.9 4.0  CL 107  --    < > 97* 102 95* 90* 92*  --   CO2 27  --    < >  28 28 31 29 31   --   GLUCOSE 163*  --    < > 175* 204* 193* 231* 239*  --   BUN 27*  --    < > 17 16 20  30* 35*  --   CREATININE 1.16*  --    < > 0.64 0.63 0.82 0.96 1.00  --   CALCIUM 8.6*  --    < > 7.9* 7.6* 8.5* 9.0 8.6*  --   MG 1.6* 1.4*   < > 2.0 1.7 1.7 1.9 1.8  --   PHOS 1.6* 2.8  --   --  3.8 3.6  --  5.4*  --    < > = values in this interval not displayed.   GFR: Estimated Creatinine Clearance: 51.3 mL/min (by C-G formula based on SCr of 1 mg/dL). Recent Labs  Lab 11/08/18 0209 11/09/18 0513 11/10/18 0200 11/11/18 0554  WBC 12.0* 14.6* 16.8* 15.4*    Liver Function Tests: Recent Labs  Lab 11/08/18 0209 11/09/18 0513 11/10/18 0200  ALBUMIN 1.7* 2.2* 2.3*   No  results for input(s): LIPASE, AMYLASE in the last 168 hours. Recent Labs  Lab 11/06/18 1100  AMMONIA 19    ABG    Component Value Date/Time   PHART 7.496 (H) 11/10/2018 1311   PCO2ART 48.9 (H) 11/10/2018 1311   PO2ART 172.0 (H) 11/10/2018 1311   HCO3 37.7 (H) 11/10/2018 1311   TCO2 39 (H) 11/10/2018 1311   ACIDBASEDEF 10.0 (H) 11/04/2018 0841   O2SAT 100.0 11/10/2018 1311       CBG: Recent Labs  Lab 11/10/18 1530 11/10/18 2007 11/11/18 0011 11/11/18 0437 11/11/18 0848  GLUCAP 143* 163* 152* 136* [place], MD [facility] ICU Physician [place]  Pager: [phone] Mobile: [phone] After hours: [phone].

## 2018-11-11 NOTE — Progress Notes (Signed)
Rehab Admissions Coordinator Note:  Patient was screened by Michel Santee for appropriateness for an Inpatient Acute Rehab Consult.  At this time, we are recommending Inpatient Rehab consult.  Michel Santee 11/11/2018, 5:03 PM  I can be reached at [phone].

## 2018-11-11 NOTE — Evaluation (Signed)
Clinical/Bedside Swallow Evaluation Patient Details  Name: Aimee Parker MRN: [id number] Date of Birth: Apr 20, 1950  Today's Date: 11/11/2018 Time: SLP Start Time (ACUTE ONLY): 1035 SLP Stop Time (ACUTE ONLY): 1110 SLP Time Calculation (min) (ACUTE ONLY): 35 min  Past Medical History:  Past Medical History:  Diagnosis Date  . Diabetes mellitus without complication ([place])   . Hypertension   . Vitamin D deficiency    Past Surgical History:  Past Surgical History:  Procedure Laterality Date  . EYE SURGERY     Left eye surgery  . PARS PLANA VITRECTOMY Right 05/13/2017   Procedure: PARS PLANA VITRECTOMY 25 GAUGE FOR ENDOPHTHALMITIS;  Surgeon: Hurman Horn, MD;  Location: [facility];  Service: Ophthalmology;  Laterality: Right;   HPI:  68 y.o.F with PMH of DM and HTN who presents with 2-3 days of fever, malaise and fatigue. She was found to have probable bilateral PNA and UTI. Developed worsening respiratory distress with increased work of breathing and accessory muscle use requiring endotracheal intubation 11/02/2018-10/29. Post extubation stridor, improved somewhat with steroids and epinephrine. Still present suggesting vocal cord dysfunction per MD. MRI shows Restricted diffusion, swelling and edema within the callosal body.    Assessment / Plan / Recommendation Clinical Impression  Pt demonstrates suprisingly good tolerance of PO trials. Oral motor function is strong, no dysarthria present. Vocal quality is hoarse with mild stridor, but pt able to communicate at phrase length. She is eager to eat and drink. Subjectively, swallow response is strong and swift. Over 2 trials of 3 oz water with thin liquids, no immediate cough. Also trialed 3 oz of nectar thick water also without coughing or throat clearing. Pt consumed 4 oz of puree with assist to self feed without difficulty. Will initiate a conservative diet of nectar and puree given ongoing respiratory impairment, shortness of breath. Will f/u  for tolerance.  SLP Visit Diagnosis: Dysphagia, oropharyngeal phase (R13.12)    Aspiration Risk  Moderate aspiration risk    Diet Recommendation Dysphagia 1 (Puree);Nectar-thick liquid   Liquid Administration via: Spoon;Cup Medication Administration: Whole meds with puree Supervision: Staff to assist with self feeding Compensations: Slow rate;Small sips/bites Postural Changes: Seated upright at 90 degrees    Other  Recommendations Oral Care Recommendations: Oral care BID   Follow up Recommendations Inpatient Rehab      Frequency and Duration min 2x/week  2 weeks       Prognosis Prognosis for Safe Diet Advancement: Good      Swallow Study   General HPI: 68 y.o.F with PMH of DM and HTN who presents with 2-3 days of fever, malaise and fatigue. She was found to have probable bilateral PNA and UTI. Developed worsening respiratory distress with increased work of breathing and accessory muscle use requiring endotracheal intubation 11/02/2018-10/29. Post extubation stridor, improved somewhat with steroids and epinephrine. Still present suggesting vocal cord dysfunction per MD. MRI shows Restricted diffusion, swelling and edema within the callosal body.  Type of Study: Bedside Swallow Evaluation Diet Prior to this Study: NPO Temperature Spikes Noted: No Respiratory Status: Nasal cannula History of Recent Intubation: Yes Length of Intubations (days): 9 days Date extubated: 11/10/18 Behavior/Cognition: Alert;Cooperative;Pleasant mood Oral Cavity Assessment: Within Functional Limits Oral Care Completed by SLP: No Oral Cavity - Dentition: Adequate natural dentition Vision: Functional for self-feeding Self-Feeding Abilities: Needs assist Patient Positioning: Upright in bed Baseline Vocal Quality: Hoarse;Low vocal intensity Volitional Cough: Strong Volitional Swallow: Able to elicit    Oral/Motor/Sensory Function Overall Oral Motor/Sensory Function: Within  functional limits   Ice  Chips     Thin Liquid Thin Liquid: Within functional limits Presentation: Straw;Cup;Self Fed    Nectar Thick Nectar Thick Liquid: Within functional limits Presentation: Straw   Honey Thick Honey Thick Liquid: Not tested   Puree Puree: Within functional limits   Solid     Solid: Not tested     Harlon Ditty, MA CCC-SLP  Acute Rehabilitation Services Pager [phone] Office [phone]  Aimee Parker, Riley Nearing 11/11/2018,11:15 AM

## 2018-11-11 NOTE — Progress Notes (Signed)
 Nutrition Follow-up  DOCUMENTATION CODES:   Not applicable  INTERVENTION:   Ensure Enlive po TID, each supplement provides 350 kcal and 20 grams of protein  Magic cup TID with meals, each supplement provides 290 kcal and 9 grams of protein  Feeding assistance at meal times   NUTRITION DIAGNOSIS:   Inadequate oral intake related to inability to eat as evidenced by NPO status.  Being addressed via diet advancement, supplements  GOAL:   Patient will meet greater than or equal to 90% of their needs  Progressing  MONITOR:   Vent status, Labs, Weight trends, TF tolerance, I & O's  REASON FOR ASSESSMENT:   Ventilator    ASSESSMENT:   68 year old female who presented to the ED on 10/20 with fever and weakness. PMH of HTN, T2DM. Pt found to have probable bilateral PNA and UTI and admitted with sepsis.  10/21 Intubated 10/23 TF initiated 10/29 Extubation, TF discontinued  Cortrak was ordered for today but was discontinued due to diet advancement post SLP evaluation. Pt now on Dysphagia I, Nectar Thick diet  Appropriate to hold on Cortrak at this time given diet advancement today and pt has been receiving TF at goal rate until extubation yesterday. If po intake inadequate, recommend re-considering Cortrak at that time  Labs: CBGs 136-240 (goal 140-180) Meds: ss novolog, lantus  Diet Order:   Diet Order            DIET - DYS 1 Room service appropriate? Yes; Fluid consistency: Nectar Thick  Diet effective now              EDUCATION NEEDS:   No education needs have been identified at this time  Skin:  Skin Assessment: Reviewed RN Assessment  Last BM:  10/30  Height:   Ht Readings from Last 1 Encounters:  11/01/18 5\' 4"  (1.626 m)    Weight:   Wt Readings from Last 1 Encounters:  11/11/18 69 kg    Ideal Body Weight:  54.5 kg  BMI:  Body mass index is 26.11 kg/m.  Estimated Nutritional Needs:   Kcal:  1700-1900 kcals  Protein:  85-95  g  Fluid:  >/= 1.7 L     Aimee Janek MS, RDN, LDN, CNSC [phone] Pager  [phone] Weekend/On-Call Pager

## 2018-11-11 NOTE — Evaluation (Signed)
Occupational Therapy Evaluation Patient Details Name: Aimee Parker MRN: [id number] DOB: January 15, 1950 Today's Date: 11/11/2018    History of Present Illness 68 y.o. F with PMH of DM and HTN who presents with 2-3 days of fever, malaise and fatigue.  She was found to have probable bilateral PNA and UTI in the ED with lactic acidosis and required Bipap. Intubated 10/21-29, now on Bipap. TGG:YIRSWNIOEV diffusion, swelling and edema within the callosal bodyand splenium as well as left cingulate gyrus. Additional punctate focus of restricted diffusion and edema within the left globus pallidus. Findings may reflect acute ischemic infarcts. However,toxic/metabolic etiologies should also be considered. Acute left cerebellar infarct   Clinical Impression   This 68 yo female admitted with above presents to acute OT with decreased mobility, decreased balance, decreased strength Bil UEs, inattention to left all affecting her safety and independence with basic ADLs. Pt is currently total A +2 for any mobility and total A for basic ADLs, she will benefit from acute OT with follow up on CIR to work back towards her PLOF of being totally independent with basic ADLs.    Follow Up Recommendations  CIR;Supervision/Assistance - 24 hour    Equipment Recommendations  Other (comment)(TBD at next venue)    Recommendations for Other Services Rehab consult     Precautions / Restrictions Precautions Precautions: Fall Restrictions Weight Bearing Restrictions: No      Mobility Bed Mobility Overal bed mobility: Needs Assistance Bed Mobility: Supine to Sit     Supine to sit: Total assist;+2 for physical assistance        Transfers Overall transfer level: Needs assistance   Transfers: Squat Pivot Transfers     Squat pivot transfers: Total assist;+2 physical assistance     General transfer comment: Use of bed pad and gait belt going to pt's left    Balance Overall balance assessment: Needs  assistance Sitting-balance support: Bilateral upper extremity supported;Feet supported Sitting balance-Leahy Scale: Poor Sitting balance - Comments: Pt with tendency to lean posteriorly when not supported and without balance reaction, pushing minimally with LUE while seated EOB   Standing balance support: Bilateral upper extremity supported Standing balance-Leahy Scale: Zero                             ADL either performed or assessed with clinical judgement   ADL Overall ADL's : Needs assistance/impaired Eating/Feeding: Total assistance;Bed level;Sitting   Grooming: Total assistance;Bed level;Sitting   Upper Body Bathing: Total assistance;Bed level;Sitting   Lower Body Bathing: Total assistance;Bed level   Upper Body Dressing : Total assistance;Bed level;Sitting   Lower Body Dressing: Total assistance;Bed level   Toilet Transfer: +2 for physical assistance;Total assistance;Squat-pivot Toilet Transfer Details (indicate cue type and reason): bed>recliner going to pt's right with use of bed pad and gait belt Toileting- Clothing Manipulation and Hygiene: Total assistance;Bed level               Vision Baseline Vision/History: Wears glasses Wears Glasses: Reading only Additional Comments: Pt with tendency with head and eyes turned to right, can look to left with head and eyes with increased cues (educated dtr to sit to her left to promote more head and eyes to left)            Pertinent Vitals/Pain Pain Assessment: No/denies pain     Hand Dominance Right   Extremity/Trunk Assessment Upper Extremity Assessment Upper Extremity Assessment: RUE deficits/detail;LUE deficits/detail RUE Deficits / Details: unable to  full raise arm on her own, full PROM RUE Coordination: decreased fine motor;decreased gross motor LUE Deficits / Details: unable to full raise arm on her own, full PROM LUE Coordination: decreased gross motor;decreased fine motor            Communication Communication Communication: Prefers language other than English(vietnamese)   Cognition Arousal/Alertness: Awake/alert Behavior During Therapy: Flat affect Overall Cognitive Status: Impaired/Different from baseline                                 General Comments: Difficult to say if issues with cognition or just due to language barrier even with video interpreter service used. Appeared slow to process information at times              Home Living Family/patient expects to be discharged to:: Private residence Living Arrangements: Spouse/significant other;Children(husband and daughter) Available Help at Discharge: Family;Available 24 hours/day Type of Home: House Home Access: Stairs to enter [employer] of Steps: 2 Entrance Stairs-Rails: None Home Layout: One level     Bathroom Shower/Tub: Occupational psychologist: Standard     Home Equipment: None          Prior Functioning/Environment Level of Independence: Independent                 OT Problem List: Decreased strength;Decreased range of motion;Impaired balance (sitting and/or standing);Impaired vision/perception;Impaired UE functional use;Impaired tone;Decreased coordination;Decreased cognition;Decreased safety awareness;Decreased knowledge of use of DME or AE      OT Treatment/Interventions: Self-care/ADL training;DME and/or AE instruction;Patient/family education;Balance training;Therapeutic exercise;Therapeutic activities;Cognitive remediation/compensation;Visual/perceptual remediation/compensation    OT Goals(Current goals can be found in the care plan section) Acute Rehab OT Goals Patient Stated Goal: daughter would like for patient to come home OT Goal Formulation: With patient/family Time For Goal Achievement: 11/25/18 Potential to Achieve Goals: Good  OT Frequency: Min 3X/week           Co-evaluation PT/OT/SLP Co-Evaluation/Treatment: Yes Reason  for Co-Treatment: Complexity of the patient's impairments (multi-system involvement);For patient/therapist safety PT goals addressed during session: Mobility/safety with mobility;Balance;Strengthening/ROM OT goals addressed during session: Strengthening/ROM      AM-PAC OT "6 Clicks" Daily Activity     Outcome Measure Help from another person eating meals?: Total Help from another person taking care of personal grooming?: Total Help from another person toileting, which includes using toliet, bedpan, or urinal?: Total Help from another person bathing (including washing, rinsing, drying)?: Total Help from another person to put on and taking off regular upper body clothing?: Total Help from another person to put on and taking off regular lower body clothing?: Total 6 Click Score: 6   End of Session Equipment Utilized During Treatment: Gait belt Nurse Communication: Need for lift equipment(maxi sky pad left under patient)  Activity Tolerance: Patient tolerated treatment well Patient left: in chair;with call bell/phone within reach;with chair alarm set;with family/visitor present  OT Visit Diagnosis: Unsteadiness on feet (R26.81);Other abnormalities of gait and mobility (R26.89);Muscle weakness (generalized) (M62.81);Other symptoms and signs involving cognitive function;Hemiplegia and hemiparesis Hemiplegia - caused by: Cerebral infarction                Time: 1610-9604 OT Time Calculation (min): 42 min Charges:  OT General Charges $OT Visit: 1 Visit OT Evaluation $OT Eval Moderate Complexity: 1 Mod OT Treatments $Self Care/Home Management : 8-22 mins  [place], OTR/L Acute [employer] Pager [phone] Office [phone]  Evette GeorgesLeonard, Shekela Goodridge Eva 11/11/2018, 12:27 PM

## 2018-11-12 DIAGNOSIS — Z9289 Personal history of other medical treatment: Secondary | ICD-10-CM

## 2018-11-12 DIAGNOSIS — N10 Acute tubulo-interstitial nephritis: Secondary | ICD-10-CM

## 2018-11-12 DIAGNOSIS — J189 Pneumonia, unspecified organism: Secondary | ICD-10-CM | POA: Diagnosis not present

## 2018-11-12 DIAGNOSIS — R7881 Bacteremia: Secondary | ICD-10-CM | POA: Diagnosis not present

## 2018-11-12 LAB — CBC
HCT: 24.9 % — ABNORMAL LOW (ref 36.0–46.0)
Hemoglobin: 8 g/dL — ABNORMAL LOW (ref 12.0–15.0)
MCH: 21.8 pg — ABNORMAL LOW (ref 26.0–34.0)
MCHC: 32.1 g/dL (ref 30.0–36.0)
MCV: 67.8 fL — ABNORMAL LOW (ref 80.0–100.0)
Platelets: 398 10*3/uL (ref 150–400)
RBC: 3.67 MIL/uL — ABNORMAL LOW (ref 3.87–5.11)
RDW: 14.5 % (ref 11.5–15.5)
WBC: 12.7 10*3/uL — ABNORMAL HIGH (ref 4.0–10.5)
nRBC: 0 % (ref 0.0–0.2)

## 2018-11-12 LAB — GLUCOSE, CAPILLARY
Glucose-Capillary: 105 mg/dL — ABNORMAL HIGH (ref 70–99)
Glucose-Capillary: 123 mg/dL — ABNORMAL HIGH (ref 70–99)
Glucose-Capillary: 134 mg/dL — ABNORMAL HIGH (ref 70–99)
Glucose-Capillary: 198 mg/dL — ABNORMAL HIGH (ref 70–99)
Glucose-Capillary: 73 mg/dL (ref 70–99)
Glucose-Capillary: 94 mg/dL (ref 70–99)

## 2018-11-12 NOTE — Progress Notes (Signed)
NAME:  Aimee Parker, MRN:  [id number], DOB:  1950-05-25, LOS: 38 ADMISSION DATE:  11/01/2018, CONSULTATION DATE: 11/04/2018 REFERRING MD: Dr. Marva Panda, CHIEF COMPLAINT: Sepsis  Brief History   68 y.o. F with PMH of DM and HTN who presents with 2-3 days of fever, malaise and fatigue.  She was found to have probable bilateral PNA and UTI in the ED with lactic acidosis and required Bipap, therefore PCCM was consulted.  Developed worsening respiratory distress with increased work of breathing and accessory muscle use requiring endotracheal intubation 11/02/2018     History of present illness   68 y.o. F coming from home with PMH of DM and HTN who c/o fever, malaise, fatigue and some abdominal pain. She has been in her normal state of health until onset of symptoms and initially presented to urgent care, then to the ED and was initially febrile to 103F with tachycardia and eventually oxygen sats dropped in the 80%'s and pt was placed on Bipap.  In the ED, labs were significant for lactic acid of 4.3, WBC 17k, CO2 19 with gap of 15, creatinine 1.67 (baseline ~1.0) troponin 334 and UA significant for leuks and bacteria and +nitrite positive. She received 4L IVF, cefepime and vancomycin.  CT chest notable for bilateral basilar infiltrates and ground glass opacities. She was initially admitted to internal medicine and as BP was down-trending and she was requiring Bipap, PCCM was consulted.  On initial evaluation, pt is awake and responsive and denies any complaints.  Interviewed with translation assistance from family [facility] interpreter on the way).  She denies any chest pain, current abdominal pain, recent URI symptoms, diarrhea or other complaints.  No recent UTI's or hospitalizations.   Past Medical History  Vitamin D deficiency Hypertension Type 2 diabetes  Significant Hospital Events   10/20 Admit to [facility] 10/21 Worsening respiratory distress requiring intubation  Consults:  Neurology  Stroke  Neuro Cardiology   Procedures:  ETT 10/21 >>  A-line 10/21 >> Left IJ CVC 10/21 >>   Significant Diagnostic Tests:  10/20 CT chest >> Patchy airspace areas of consolidation in the posterior lower lungs with ground-glass opacities in the upper lungs   10/20 CT abd/pelvis >> Heterogeneous left renal nephrogram with heterogenous striated appearance consistent with pyelonephritis, moderate left perinephritic edema and mild left ureteral prominence. Wall thickening of splenic flexure of colon likely due to renal inflammation   10/25 CT head>> 10 x 15 mm hypodensity left posterior cerebellum. Extensive low density body and splenium of corpus callosum.  10/25 MRI brain>>Restricted diffusion, swelling and edema within the callosal body and splenium as well as left cingulate gyrus. Additional punctate focus of restricted diffusion and edema within the left globus pallidus. Findings may reflect acute ischemic infarcts. However, toxic/metabolic etiologies should also be considered. Acute left cerebellar infarct. Bilateral mastoid effusions, large on the right. Fluid signal also present within the right eustachian tube.  10/28 TEE-normal LV RV function.  No significant valvular abnormality.  No evidence of endocarditis.  No obvious cardioembolic source.  Micro Data:  10/20 Sars-CoV-2 >> neg 10/20 Blood culture >> Ecoli  10/20 UC  >>multiple species present, suggest recollection, FINAL.  10/21 RVP  >> neg 10/22 Sputum Culture >>  Antimicrobials:  Vancomycin 10/20 >10/21. 10/25>> 10/27 Cefepime 10/20>>10/25 Ceftriaxone 10/21>>10/25 Acyclovir 10/25>> 10/27 Ampicillin 10/25>> 10/27 Ancef 10/27  Interim history/subjective:   No issues overnight.  No longer requiring oxygen.  Patient stable.  Objective   Blood pressure (!) 125/58, pulse 63, temperature  98 F (36.7 C), temperature source Oral, resp. rate 13, height 5\' 4"  (1.626 m), weight 63 kg, SpO2 97 %.        Intake/Output Summary  (Last 24 hours) at 11/12/2018 1314 Last data filed at 11/12/2018 0500 Gross per 24 hour  Intake 299.86 ml  Output 650 ml  Net -350.14 ml   Filed Weights   11/09/18 0500 11/11/18 0500 11/12/18 0448  Weight: 68.4 kg 69 kg 63 kg    Examination: Ambulatory General: 68 years old resting in bed no acute distress HENT: NCAT, pupils reactive tracking appropriately Neck: No JVD, trachea midline CV: Regular rate and rhythm, S1-S2, no MRG Lungs: Clear to auscultation bilaterally no crackles no wheeze  ABD: Soft nontender nondistended GU: Pure wick EXT: No significant edema in lower extremities Skin: No obvious rash Neuro: Alert oriented following commands no focal deficit   Resolved Hospital Problem list   Respiratory failure.  Assessment & Plan:   Acute hypoxic respiratory failure requiring mechanical ventilation Initial presenting imaging with bilateral infiltrates concern for pulmonary edema in the setting of atrial fibrillation with RVR. -Patient has been successfully extubated from the ventilator for greater than 24 hours. -There was some evidence of post extubation stridor that was treated with steroids and epinephrine.  This has all resolved. -Now sinus rhythm, completed amiodarone infusion On beta-blockade, 50 mg twice daily  Sepsis secondary to E. coli bacteremia now off pressors -Pyelonephritis as presumed source, mild hydronephrosis no evidence of abscess -Continue cefazolin, stop date already placed, 14 days total  Embolic stroke, subacute History of paroxysmal AF likely cause -No sign of endocarditis -Continue on Lovenox full dose twice daily.  Patient stable for transfer from the ICU.   Best practice:  Diet: Continue tube feeds  Pain/Anxiety/Delirium protocol (if indicated): none VAP protocol (if indicated): In place DVT prophylaxis: SCDs, Lovenox treatment dose. GI prophylaxis: PPI Glucose control: transition to SSI with Lantus  Mobility: Bedrest Code  Status: Full code Family Communication: Daughter Disposition: ICU  Labs   CBC: Recent Labs  Lab 11/06/18 0432  11/08/18 0209 11/09/18 0513 11/10/18 0200 11/10/18 1311 11/11/18 0554 11/12/18 0348  WBC 12.2*   < > 12.0* 14.6* 16.8*  --  15.4* 12.7*  NEUTROABS 7.6  --   --   --   --   --   --   --   HGB 8.4*   < > 7.8* 8.0* 8.0* 9.9* 8.1* 8.0*  HCT 25.2*   < > 23.5* 25.3* 25.5* 29.0* 25.8* 24.9*  MCV 65.5*   < > 67.1* 68.4* 69.7*  --  68.8* 67.8*  PLT 74*   < > 140* 224 276  --  356 398   < > = values in this interval not displayed.    Basic Metabolic Panel: Recent Labs  Lab 11/05/18 1725  11/07/18 0336 11/08/18 0209 11/09/18 0513 11/09/18 2010 11/10/18 0200 11/10/18 1311  NA  --    < > 136 137 136 137 137 136  K  --    < > 3.2* 4.1 4.4 4.0 3.9 4.0  CL  --    < > 97* 102 95* 90* 92*  --   CO2  --    < > 28 28 31 29 31   --   GLUCOSE  --    < > 175* 204* 193* 231* 239*  --   BUN  --    < > 17 16 20  30* 35*  --   CREATININE  --    < >  0.64 0.63 0.82 0.96 1.00  --   CALCIUM  --    < > 7.9* 7.6* 8.5* 9.0 8.6*  --   MG 1.4*   < > 2.0 1.7 1.7 1.9 1.8  --   PHOS 2.8  --   --  3.8 3.6  --  5.4*  --    < > = values in this interval not displayed.   GFR: Estimated Creatinine Clearance: 46.5 mL/min (by C-G formula based on SCr of 1 mg/dL). Recent Labs  Lab 11/09/18 0513 11/10/18 0200 11/11/18 0554 11/12/18 0348  WBC 14.6* 16.8* 15.4* 12.7*    Liver Function Tests: Recent Labs  Lab 11/08/18 0209 11/09/18 0513 11/10/18 0200  ALBUMIN 1.7* 2.2* 2.3*   No results for input(s): LIPASE, AMYLASE in the last 168 hours. Recent Labs  Lab 11/06/18 1100  AMMONIA 19    ABG    Component Value Date/Time   PHART 7.496 (H) 11/10/2018 1311   PCO2ART 48.9 (H) 11/10/2018 1311   PO2ART 172.0 (H) 11/10/2018 1311   HCO3 37.7 (H) 11/10/2018 1311   TCO2 39 (H) 11/10/2018 1311   ACIDBASEDEF 10.0 (H) 11/04/2018 0841   O2SAT 100.0 11/10/2018 1311     CBG: Recent Labs  Lab  11/11/18 2010 11/12/18 0043 11/12/18 0355 11/12/18 0816 11/12/18 1152  GLUCAP 189* 123* 105* 94 134*     Josephine Igo, DO [facility] 11/12/2018 1:22 PM

## 2018-11-12 NOTE — Progress Notes (Signed)
  Speech Language Pathology Treatment: Dysphagia  Patient Details Name: Aimee Parker MRN: [id number] DOB: 11/26/1950 Today's Date: 11/12/2018 Time: 5638-9373 SLP Time Calculation (min) (ACUTE ONLY): 22 min  Assessment / Plan / Recommendation Clinical Impression  Patient's nurse reported she fed pt breakfast and pt tolerated well. Pt requested ice chips from nurse. She is still requiring supplemental oxygen via nasal canula, however no stridor was noted. Trials of water and ice chips were tolerated well with no coughing or vocal changes. Pt had prolonged mastication and a delayed strong cough with cracker. Will continue to manage conservatively due to suspected vocal cord involvement as well as prolonged intubation. Recommend Dys 1, nectar thickened liquids and ice chips between meals. Medications to be given whole in puree.    HPI HPI: 68 y.o.F with PMH of DM and HTN who presents with 2-3 days of fever, malaise and fatigue. She was found to have probable bilateral PNA and UTI. Developed worsening respiratory distress with increased work of breathing and accessory muscle use requiring endotracheal intubation 11/02/2018-10/29. Post extubation stridor, improved somewhat with steroids and epinephrine. Still present suggesting vocal cord dysfunction per MD. MRI shows Restricted diffusion, swelling and edema within the callosal body.                Recommendations  Diet recommendations: Dysphagia 1 (puree);Nectar-thick liquid(may have ice chips between meals) Liquids provided via: Cup Medication Administration: Whole meds with puree Supervision: Patient able to self feed Compensations: Slow rate;Small sips/bites Postural Changes and/or Swallow Maneuvers: Seated upright 90 degrees                        GO                [place], MA, CCC-SLP 11/12/2018 10:22 AM

## 2018-11-12 NOTE — Progress Notes (Signed)
Pt resting comfortably off BIPAP on 5L. RT to cont to monitor.

## 2018-11-13 DIAGNOSIS — Z789 Other specified health status: Secondary | ICD-10-CM

## 2018-11-13 DIAGNOSIS — E1165 Type 2 diabetes mellitus with hyperglycemia: Secondary | ICD-10-CM

## 2018-11-13 DIAGNOSIS — I480 Paroxysmal atrial fibrillation: Secondary | ICD-10-CM

## 2018-11-13 LAB — RETICULOCYTES
Immature Retic Fract: 25.5 % — ABNORMAL HIGH (ref 2.3–15.9)
RBC.: 4.21 MIL/uL (ref 3.87–5.11)
Retic Count, Absolute: 136 10*3/uL (ref 19.0–186.0)
Retic Ct Pct: 3.2 % — ABNORMAL HIGH (ref 0.4–3.1)

## 2018-11-13 LAB — BASIC METABOLIC PANEL
Anion gap: 10 (ref 5–15)
BUN: 27 mg/dL — ABNORMAL HIGH (ref 8–23)
CO2: 27 mmol/L (ref 22–32)
Calcium: 8.4 mg/dL — ABNORMAL LOW (ref 8.9–10.3)
Chloride: 102 mmol/L (ref 98–111)
Creatinine, Ser: 0.71 mg/dL (ref 0.44–1.00)
GFR calc Af Amer: >60 mL/min (ref >60)
GFR calc non Af Amer: >60 mL/min (ref >60)
Glucose, Bld: 94 mg/dL (ref 70–99)
Potassium: 3.9 mmol/L (ref 3.5–5.1)
Sodium: 139 mmol/L (ref 135–145)

## 2018-11-13 LAB — CBC
HCT: 28.3 % — ABNORMAL LOW (ref 36.0–46.0)
Hemoglobin: 8.8 g/dL — ABNORMAL LOW (ref 12.0–15.0)
MCH: 21.7 pg — ABNORMAL LOW (ref 26.0–34.0)
MCHC: 31.1 g/dL (ref 30.0–36.0)
MCV: 69.7 fL — ABNORMAL LOW (ref 80.0–100.0)
Platelets: 459 10*3/uL — ABNORMAL HIGH (ref 150–400)
RBC: 4.06 MIL/uL (ref 3.87–5.11)
RDW: 15.1 % (ref 11.5–15.5)
WBC: 8.4 10*3/uL (ref 4.0–10.5)
nRBC: 0 % (ref 0.0–0.2)

## 2018-11-13 LAB — GLUCOSE, CAPILLARY
Glucose-Capillary: 115 mg/dL — ABNORMAL HIGH (ref 70–99)
Glucose-Capillary: 144 mg/dL — ABNORMAL HIGH (ref 70–99)
Glucose-Capillary: 194 mg/dL — ABNORMAL HIGH (ref 70–99)
Glucose-Capillary: 219 mg/dL — ABNORMAL HIGH (ref 70–99)
Glucose-Capillary: 82 mg/dL (ref 70–99)
Glucose-Capillary: 92 mg/dL (ref 70–99)

## 2018-11-13 LAB — MAGNESIUM: Magnesium: 2.1 mg/dL (ref 1.7–2.4)

## 2018-11-13 LAB — IRON AND TIBC
Iron: 61 ug/dL (ref 28–170)
Saturation Ratios: 20 % (ref 10.4–31.8)
TIBC: 304 ug/dL (ref 250–450)
UIBC: 243 ug/dL

## 2018-11-13 LAB — FOLATE: Folate: 21.6 ng/mL (ref >5.9)

## 2018-11-13 LAB — FERRITIN: Ferritin: 314 ng/mL — ABNORMAL HIGH (ref 11–307)

## 2018-11-13 LAB — VITAMIN B12: Vitamin B-12: 2569 pg/mL — ABNORMAL HIGH (ref 180–914)

## 2018-11-13 MED ORDER — CEPHALEXIN 500 MG PO CAPS
500.0000 mg | ORAL_CAPSULE | Freq: Three times a day (TID) | ORAL | Status: AC
  Administered 2018-11-13 – 2018-11-14 (×5): 500 mg via ORAL
  Filled 2018-11-13 (×7): qty 1

## 2018-11-13 MED ORDER — ATORVASTATIN CALCIUM 40 MG PO TABS
40.0000 mg | ORAL_TABLET | Freq: Every day | ORAL | Status: DC
  Administered 2018-11-13 – 2018-11-15 (×3): 40 mg via ORAL
  Filled 2018-11-13 (×3): qty 1

## 2018-11-13 MED ORDER — APIXABAN 5 MG PO TABS
5.0000 mg | ORAL_TABLET | Freq: Two times a day (BID) | ORAL | Status: DC
  Administered 2018-11-13 – 2018-11-15 (×4): 5 mg via ORAL
  Filled 2018-11-13 (×4): qty 1

## 2018-11-13 NOTE — Progress Notes (Signed)
PROGRESS NOTE  Aimee Parker QMV:[id number] DOB: [birth year]   PCP: Gildardo Pounds, NP  Patient is from: Home.  DOA: 11/01/2018 LOS: 12  Brief Narrative / Interim history: 68 year old female with history of DM and HTN presented 11/01/2018 with fever, malaise, fatigue and abdominal pain.  She was admitted to internal medicine service for working diagnosis of sepsis due to UTI and possible pneumonia.  She had progressive dyspnea requiring BiPAP and transferred to ICU and intubated 11/02/2018.   She was treated for acute respiratory failure due to pulmonary edema, sepsis with E. coli bacteremia and pyelonephritis.   Hospital course complicated by cerebellar and diffuse cortical and subcortical infarcts as noted on MRI brain on 11/06/2018. Neurology consulted and recommended starting an DAPT for 3 weeks followed by aspirin alone.  However, patient had A. fib with RVR in 11/11/2018.  She was a started on IV amiodarone and Lovenox.  She converted to sinus rhythm and transition to metoprolol for rate control and continued on Lovenox.  She was extubated 10/28 to 6 L by [place] and remained stable.  Transferred to [facility] service 11/1.  Evaluated by PT/OT who recommended CIR.  She is currently being evaluated for CIR admission.   Subjective: No major events overnight of this morning.  Heart rate in upper 40s to lower 50s overnight but not symptomatic.  No complaint this morning.  Both patient and family have very poor insight to her current condition.  She denies chest pain, dyspnea, nausea, vomiting, abdominal pain or UTI symptoms.  Video interpreter, Wele with ID number [id number] was used for this encounter.  Objective: Vitals:   11/13/18 0500 11/13/18 0600 11/13/18 0826 11/13/18 0930  BP: 132/60 (!) 144/56    Pulse: (!) 47 (!) 49  (!) 52  Resp: 19 18    Temp:   97.8 F (36.6 C)   TempSrc:   Oral   SpO2: 98% 96%    Weight: 64 kg     Height:        Intake/Output Summary (Last 24 hours) at  11/13/2018 1131 Last data filed at 11/13/2018 0938 Gross per 24 hour  Intake 640 ml  Output 800 ml  Net -160 ml   Filed Weights   11/11/18 0500 11/12/18 0448 11/13/18 0500  Weight: 69 kg 63 kg 64 kg    Examination:  GENERAL: No acute distress.  Appears well.  HEENT: MMM.  Vision and hearing grossly intact.  NECK: Supple.  No apparent JVD.  RESP:  No IWOB. Good air movement bilaterally. CVS:  RRR. Heart sounds normal.  ABD/GI/GU: Bowel sounds present. Soft. Non tender.  MSK/EXT:  No apparent deformity or edema. Moves extremities. SKIN: no apparent skin lesion or wound NEURO: Awake, alert and oriented appropriately.  No gross deficit.  PSYCH: Calm. Normal affect.  Assessment & Plan: Acute respiratory failure with hypoxia-multifactorial including pulmonary edema/possible infiltrate, A. fib with RVR and sepsis due to E. coli bacteremia.  ETT 10/21-10/28.  Currently doing well on 2 L by [place]. -Continue weaning oxygen -Treat treatable causes. -Continue as needed albuterol  Sepsis secondary to E. coli bacteremia/pyelonephritis with mild hydronephrosis: Sepsis physiology resolved. Heart TTE and TEE negative for vegetation.  -ID recommended Ancef or Keflex for 10 days using 10/23 as day 1.  -Transition to Keflex today.  A. fib with RVR: Now sinus rhythm with borderline bradycardia.  Not symptomatic -Continue metoprolol and Lovenox-will eventually transition to NOAC  Acute left cerebellar embolic stroke/possible callosal body, splenium, left cingulate gyrus  and left globus pallidus ischemic infarct. TEE negative for PFO.  Patient dose of Lovenox and aspirin.  Initially, neurology recommended DAPT for 3 weeks followed by aspirin when they signed off on 10/28.  However, patient had A. fib with RVR and was started on Lovenox. -Discussed with neurology , Dr. Erlinda Hong 11/1-recommended Eliquis and aspirin  Essential hypertension: SBP in 130s and 140s. -On metoprolol as above. -Resume home  medications as needed  Normocytic anemia: Hgb 8.8-improved. -Check anemia panel  Generalized weakness -PT/OT recommended CIR  NIDDM-2 CBG (last 3)  Recent Labs    11/13/18 0023 11/13/18 0459 11/13/18 0848  GLUCAP 92 82 144*  -Continue SSI -Check A1c -Start statin.      Nutrition Problem: Inadequate oral intake Etiology: inability to eat  Signs/Symptoms: NPO status  Interventions: Tube feeding   DVT prophylaxis: On Eliquis for anticoagulation Code Status: Full code Family Communication: Daughter updated at bedside. Disposition Plan: Remains inpatient pending admission to CIR. Consultants: PCCM, neurology, cardiology  Procedures:  ETT 10/21-10/28  Microbiology summarized: 10/20-COVID-19 and MRSA PCR negative. 10/20-blood culture with pansensitive E. coli  10/20-urine culture with multiple species  Sch Meds:  Scheduled Meds: . aspirin  81 mg Oral Daily  . chlorhexidine gluconate (MEDLINE KIT)  15 mL Mouth Rinse BID  . Chlorhexidine Gluconate Cloth  6 each Topical Daily  . enoxaparin (LOVENOX) injection  1 mg/kg Subcutaneous Q12H  . feeding supplement (ENSURE ENLIVE)  237 mL Oral TID BM  . insulin aspart  0-15 Units Subcutaneous Q4H  . insulin glargine  20 Units Subcutaneous Q24H  . metoprolol tartrate  50 mg Oral BID  . polyethylene glycol  17 g Per Tube Daily  . sennosides  5 mL Per Tube BID  . sodium chloride flush  3 mL Intravenous Q12H   Continuous Infusions: . sodium chloride Stopped (11/06/18 0751)  . sodium chloride    .  ceFAZolin (ANCEF) IV Stopped (11/13/18 0550)   PRN Meds:.acetaminophen, acetaminophen, albuterol, bisacodyl, docusate, ondansetron (ZOFRAN) IV, Resource ThickenUp Clear  Antimicrobials: Anti-infectives (From admission, onward)   Start     Dose/Rate Route Frequency Ordered Stop   11/11/18 1011  cefTRIAXone (ROCEPHIN) 2 g in sodium chloride 0.9 % 100 mL IVPB  Status:  Discontinued     2 g 200 mL/hr over 30 Minutes Intravenous  Every 24 hours 11/10/18 1206 11/10/18 1245   11/11/18 0600  ceFAZolin (ANCEF) IVPB 2g/100 mL premix     2 g 200 mL/hr over 30 Minutes Intravenous Every 8 hours 11/10/18 1245 11/14/18 2359   11/07/18 1000  cefTRIAXone (ROCEPHIN) 2 g in sodium chloride 0.9 % 100 mL IVPB  Status:  Discontinued     2 g 200 mL/hr over 30 Minutes Intravenous Every 12 hours 11/06/18 1809 11/10/18 1206   11/07/18 0700  vancomycin (VANCOCIN) IVPB 750 mg/150 ml premix  Status:  Discontinued     750 mg 150 mL/hr over 60 Minutes Intravenous Every 12 hours 11/06/18 1817 11/07/18 1115   11/06/18 1900  ampicillin (OMNIPEN) 2 g in sodium chloride 0.9 % 100 mL IVPB  Status:  Discontinued     2 g 300 mL/hr over 20 Minutes Intravenous Every 4 hours 11/06/18 1808 11/07/18 1115   11/06/18 1900  vancomycin (VANCOCIN) 1,500 mg in sodium chloride 0.9 % 500 mL IVPB     1,500 mg 250 mL/hr over 120 Minutes Intravenous  Once 11/06/18 1817 11/06/18 2117   11/06/18 1900  acyclovir (ZOVIRAX) 700 mg in dextrose 5 % 100  mL IVPB  Status:  Discontinued     700 mg 114 mL/hr over 60 Minutes Intravenous Every 8 hours 11/06/18 1817 11/07/18 1115   11/03/18 1800  vancomycin (VANCOCIN) 1,250 mg in sodium chloride 0.9 % 250 mL IVPB  Status:  Discontinued     1,250 mg 166.7 mL/hr over 90 Minutes Intravenous Every 48 hours 11/01/18 2228 11/02/18 0859   11/02/18 1700  ceFEPIme (MAXIPIME) 2 g in sodium chloride 0.9 % 100 mL IVPB  Status:  Discontinued     2 g 200 mL/hr over 30 Minutes Intravenous Every 24 hours 11/01/18 2228 11/02/18 0948   11/02/18 1700  cefTRIAXone (ROCEPHIN) 2 g in sodium chloride 0.9 % 100 mL IVPB  Status:  Discontinued     2 g 200 mL/hr over 30 Minutes Intravenous Every 24 hours 11/02/18 0948 11/06/18 1809   11/01/18 1630  vancomycin (VANCOCIN) 1,500 mg in sodium chloride 0.9 % 500 mL IVPB     1,500 mg 250 mL/hr over 120 Minutes Intravenous  Once 11/01/18 1549 11/01/18 1954   11/01/18 1600  ceFEPIme (MAXIPIME) 2 g in sodium  chloride 0.9 % 100 mL IVPB     2 g 200 mL/hr over 30 Minutes Intravenous  Once 11/01/18 1549 11/01/18 1736       I have personally reviewed the following labs and images: CBC: Recent Labs  Lab 11/09/18 0513 11/10/18 0200 11/10/18 1311 11/11/18 0554 11/12/18 0348 11/13/18 0656  WBC 14.6* 16.8*  --  15.4* 12.7* 8.4  HGB 8.0* 8.0* 9.9* 8.1* 8.0* 8.8*  HCT 25.3* 25.5* 29.0* 25.8* 24.9* 28.3*  MCV 68.4* 69.7*  --  68.8* 67.8* 69.7*  PLT 224 276  --  356 398 459*   BMP &GFR Recent Labs  Lab 11/08/18 0209 11/09/18 0513 11/09/18 2010 11/10/18 0200 11/10/18 1311 11/13/18 0656  NA 137 136 137 137 136 139  K 4.1 4.4 4.0 3.9 4.0 3.9  CL 102 95* 90* 92*  --  102  CO2 28 31 29 31   --  27  GLUCOSE 204* 193* 231* 239*  --  94  BUN 16 20 30* 35*  --  27*  CREATININE 0.63 0.82 0.96 1.00  --  0.71  CALCIUM 7.6* 8.5* 9.0 8.6*  --  8.4*  MG 1.7 1.7 1.9 1.8  --  2.1  PHOS 3.8 3.6  --  5.4*  --   --    Estimated Creatinine Clearance: 58.1 mL/min (by C-G formula based on SCr of 0.71 mg/dL). Liver & Pancreas: Recent Labs  Lab 11/08/18 0209 11/09/18 0513 11/10/18 0200  ALBUMIN 1.7* 2.2* 2.3*   No results for input(s): LIPASE, AMYLASE in the last 168 hours. No results for input(s): AMMONIA in the last 168 hours. Diabetic: No results for input(s): HGBA1C in the last 72 hours. Recent Labs  Lab 11/12/18 1620 11/12/18 2116 11/13/18 0023 11/13/18 0459 11/13/18 0848  GLUCAP 198* 73 92 82 144*   Cardiac Enzymes: No results for input(s): CKTOTAL, CKMB, CKMBINDEX, TROPONINI in the last 168 hours. No results for input(s): PROBNP in the last 8760 hours. Coagulation Profile: Recent Labs  Lab 11/07/18 1139  INR 1.0   Thyroid Function Tests: No results for input(s): TSH, T4TOTAL, FREET4, T3FREE, THYROIDAB in the last 72 hours. Lipid Profile: No results for input(s): CHOL, HDL, LDLCALC, TRIG, CHOLHDL, LDLDIRECT in the last 72 hours. Anemia Panel: No results for input(s):  VITAMINB12, FOLATE, FERRITIN, TIBC, IRON, RETICCTPCT in the last 72 hours. Urine analysis:  Component Value Date/Time   COLORURINE YELLOW 11/04/2018 0841   APPEARANCEUR CLEAR 11/04/2018 0841   LABSPEC 1.020 11/04/2018 0841   PHURINE 5.0 11/04/2018 0841   GLUCOSEU NEGATIVE 11/04/2018 0841   HGBUR MODERATE (A) 11/04/2018 0841   BILIRUBINUR NEGATIVE 11/04/2018 0841   BILIRUBINUR negative 05/23/2015 1120   KETONESUR 80 (A) 11/04/2018 0841   PROTEINUR 100 (A) 11/04/2018 0841   UROBILINOGEN 0.2 05/23/2015 1120   UROBILINOGEN 0.2 12/14/2013 1203   NITRITE NEGATIVE 11/04/2018 0841   LEUKOCYTESUR NEGATIVE 11/04/2018 0841   Sepsis Labs: Invalid input(s): PROCALCITONIN, [place]  Microbiology: No results found for this or any previous visit (from the past 240 hour(s)).  Radiology Studies: No results found.  45 minutes with more than 50% spent in reviewing records, counseling patient/family and coordinating care.  Taye T. Ada  If 7PM-7AM, please contact night-coverage [email or web address] Password [facility] 11/13/2018, 11:31 AM

## 2018-11-13 NOTE — Progress Notes (Signed)
ANTICOAGULATION CONSULT NOTE - Follow Up Consult  Pharmacy Consult for apixaban  Indication: atrial fibrillation  No Known Allergies  Patient Measurements: Height: 5\' 4"  (162.6 cm) Weight: 141 lb 1.5 oz (64 kg) IBW/kg (Calculated) : 54.7 Heparin Dosing Weight: 68.4 kg  Vital Signs: Temp: 97.8 F (36.6 C) (11/01 0826) Temp Source: Oral (11/01 0826) BP: 144/56 (11/01 0600) Pulse Rate: 52 (11/01 0930)  Labs: Recent Labs    11/11/18 0554 11/12/18 0348 11/13/18 0656  HGB 8.1* 8.0* 8.8*  HCT 25.8* 24.9* 28.3*  PLT 356 398 459*  HEPARINUNFRC 0.62  --   --   CREATININE  --   --  0.71    Estimated Creatinine Clearance: 58.1 mL/min (by C-G formula based on SCr of 0.71 mg/dL).   Assessment: 68 yr old female with new atrial fibrillation. She presented to the hospital on 11/01/18 with AMS. MRI showed multiple strokes involving corpus callosum body, splenium, left basal ganglia, likely embolic, possibly septic emboli vs paradoxical embolism.  Neurology is recommending DOAC in this patient, we will initiate apixaban 5mg  BID (Scr <1.5, Age <80, TBW >60)    Goal of Therapy:  Monitor platelets by anticoagulation protocol: Yes   Plan:  Apixaban 5mg  BID  Nicoletta Dress, PharmD PGY2 Infectious Disease Pharmacy Resident  11/13/2018 12:23 PM

## 2018-11-13 NOTE — Progress Notes (Signed)
Pt not on BIPAP at this time and tolerating well.

## 2018-11-14 ENCOUNTER — Encounter (HOSPITAL_COMMUNITY): Payer: Self-pay | Admitting: *Deleted

## 2018-11-14 ENCOUNTER — Other Ambulatory Visit: Payer: Self-pay

## 2018-11-14 LAB — BASIC METABOLIC PANEL
Anion gap: 10 (ref 5–15)
BUN: 18 mg/dL (ref 8–23)
CO2: 25 mmol/L (ref 22–32)
Calcium: 8.6 mg/dL — ABNORMAL LOW (ref 8.9–10.3)
Chloride: 103 mmol/L (ref 98–111)
Creatinine, Ser: 0.62 mg/dL (ref 0.44–1.00)
GFR calc Af Amer: >60 mL/min (ref >60)
GFR calc non Af Amer: >60 mL/min (ref >60)
Glucose, Bld: 89 mg/dL (ref 70–99)
Potassium: 3.7 mmol/L (ref 3.5–5.1)
Sodium: 138 mmol/L (ref 135–145)

## 2018-11-14 LAB — CBC
HCT: 29.3 % — ABNORMAL LOW (ref 36.0–46.0)
Hemoglobin: 9.2 g/dL — ABNORMAL LOW (ref 12.0–15.0)
MCH: 21.9 pg — ABNORMAL LOW (ref 26.0–34.0)
MCHC: 31.4 g/dL (ref 30.0–36.0)
MCV: 69.8 fL — ABNORMAL LOW (ref 80.0–100.0)
Platelets: 559 10*3/uL — ABNORMAL HIGH (ref 150–400)
RBC: 4.2 MIL/uL (ref 3.87–5.11)
RDW: 15.3 % (ref 11.5–15.5)
WBC: 14.3 10*3/uL — ABNORMAL HIGH (ref 4.0–10.5)
nRBC: 0 % (ref 0.0–0.2)

## 2018-11-14 LAB — GLUCOSE, CAPILLARY
Glucose-Capillary: 140 mg/dL — ABNORMAL HIGH (ref 70–99)
Glucose-Capillary: 153 mg/dL — ABNORMAL HIGH (ref 70–99)
Glucose-Capillary: 165 mg/dL — ABNORMAL HIGH (ref 70–99)
Glucose-Capillary: 200 mg/dL — ABNORMAL HIGH (ref 70–99)
Glucose-Capillary: 83 mg/dL (ref 70–99)
Glucose-Capillary: 86 mg/dL (ref 70–99)
Glucose-Capillary: 95 mg/dL (ref 70–99)

## 2018-11-14 LAB — HEMOGLOBIN A1C
Hgb A1c MFr Bld: 8 % — ABNORMAL HIGH (ref 4.8–5.6)
Mean Plasma Glucose: 182.9 mg/dL

## 2018-11-14 LAB — MAGNESIUM: Magnesium: 2.2 mg/dL (ref 1.7–2.4)

## 2018-11-14 MED ORDER — INSULIN ASPART 100 UNIT/ML ~~LOC~~ SOLN
0.0000 [IU] | Freq: Three times a day (TID) | SUBCUTANEOUS | Status: DC
  Administered 2018-11-14 (×2): 2 [IU] via SUBCUTANEOUS
  Administered 2018-11-15 (×2): 1 [IU] via SUBCUTANEOUS

## 2018-11-14 MED ORDER — ACETAMINOPHEN 325 MG PO TABS: 650.0000 mg | ORAL_TABLET | ORAL | Status: DC | PRN

## 2018-11-14 MED ORDER — METOPROLOL TARTRATE 25 MG PO TABS
25.0000 mg | ORAL_TABLET | Freq: Two times a day (BID) | ORAL | Status: DC
  Administered 2018-11-14 – 2018-11-15 (×2): 25 mg via ORAL
  Filled 2018-11-14 (×2): qty 1

## 2018-11-14 MED ORDER — RESOURCE THICKENUP CLEAR PO POWD
ORAL | Status: DC | PRN
  Filled 2018-11-14: qty 125

## 2018-11-14 MED ORDER — SENNA 8.6 MG PO TABS
1.0000 | ORAL_TABLET | Freq: Two times a day (BID) | ORAL | Status: DC
  Administered 2018-11-15: 10:00:00 8.6 mg via ORAL
  Filled 2018-11-14: qty 1

## 2018-11-14 MED ORDER — INFLUENZA VAC A&B SA ADJ QUAD 0.5 ML IM PRSY
0.5000 mL | PREFILLED_SYRINGE | INTRAMUSCULAR | Status: DC
  Filled 2018-11-14: qty 0.5

## 2018-11-14 MED ORDER — INSULIN ASPART 100 UNIT/ML ~~LOC~~ SOLN: 0.0000 [IU] | Freq: Every day | SUBCUTANEOUS | Status: DC

## 2018-11-14 NOTE — Discharge Instructions (Signed)

## 2018-11-14 NOTE — Progress Notes (Signed)
PROGRESS NOTE  Belinda Schlichting SHU:[id number] DOB: 02-04-1950   PCP: Gildardo Pounds, NP  Patient is from: Home.  DOA: 11/01/2018 LOS: 13  Brief Narrative / Interim history: 68 year old female with history of DM and HTN presented 11/01/2018 with fever, malaise, fatigue and abdominal pain.  She was admitted to internal medicine service for working diagnosis of sepsis due to UTI and possible pneumonia.  She had progressive dyspnea requiring BiPAP and transferred to ICU and intubated 11/02/2018.   She was treated for acute respiratory failure due to pulmonary edema, sepsis with E. coli bacteremia and pyelonephritis.   Hospital course complicated by cerebellar and diffuse cortical and subcortical infarcts as noted on MRI brain on 11/06/2018. Neurology consulted and recommended starting an DAPT for 3 weeks followed by aspirin alone.  However, patient had A. fib with RVR in 11/11/2018.  She was a started on IV amiodarone and Lovenox.  She converted to sinus rhythm and transition to metoprolol for rate control and continued on Lovenox.  She was extubated 10/28 to 6 L by [place] and remained stable.  Transferred to [facility] service 11/1.  Evaluated by PT/OT who recommended CIR.  She is currently being evaluated for CIR admission.   Subjective: Visit made with help of interpreter present. Nurse Ginger present for visit. Patient denies any significant pain or shortness of breath. She does report having an occasional cough that is productive of sputum. She has not had any nausea or vomiting.  Objective: Vitals:   11/13/18 2049 11/14/18 0419 11/14/18 0952 11/14/18 1333  BP: (!) 137/55 (!) 127/58 (!) 153/69 (!) 137/56  Pulse: 64 62 61 (!) 52  Resp: 18 18  15   Temp: 98.5 F (36.9 C) 98.1 F (36.7 C)  98.4 F (36.9 C)  TempSrc: Oral Oral  Oral  SpO2: 96% 96%  97%  Weight:  60.6 kg    Height:        Intake/Output Summary (Last 24 hours) at 11/14/2018 1431 Last data filed at 11/14/2018 0900 Gross per 24  hour  Intake 120 ml  Output -  Net 120 ml   Filed Weights   11/12/18 0448 11/13/18 0500 11/14/18 0419  Weight: 63 kg 64 kg 60.6 kg    Examination:  General exam: Alert, awake, oriented x 3 Respiratory system: Clear to auscultation. Respiratory effort normal. Cardiovascular system:RRR. No murmurs, rubs, gallops. Gastrointestinal system: Abdomen is nondistended, soft and nontender. No organomegaly or masses felt. Normal bowel sounds heard. Central nervous system: Alert and oriented. No focal neurological deficits. Extremities: No C/C/E, +pedal pulses Skin: No rashes, lesions or ulcers Psychiatry: Judgement and insight appear normal. Mood & affect appropriate.    Assessment & Plan: Acute respiratory failure with hypoxia-multifactorial including pulmonary edema/possible infiltrate, A. fib with RVR and sepsis due to E. coli bacteremia.  ETT 10/21-10/28.  Currently doing well on 2 L by . -Continue weaning oxygen -Treat treatable causes. -Continue as needed albuterol  Sepsis secondary to E. coli bacteremia/pyelonephritis with mild hydronephrosis: Sepsis physiology resolved. Heart TTE and TEE negative for vegetation.  -ID recommended Ancef or Keflex for 10 days using 10/23 as day 1.  -Transitioned to Keflex on 11/1. -she does have a leukocytosis today, but has no fever or other signs of infection. Will continue to monitor for now  A. fib with RVR: Now sinus rhythm with borderline bradycardia.  Not symptomatic -Continue metoprolol and apixiban  Acute left cerebellar embolic stroke/possible callosal body, splenium, left cingulate gyrus and left globus pallidus ischemic infarct.  TEE negative for PFO.  Patient dose of Lovenox and aspirin.  Initially, neurology recommended DAPT for 3 weeks followed by aspirin when they signed off on 10/28.  However, patient had A. fib with RVR and was started on full dose lovenox. -Discussed with neurology , Dr. Erlinda Hong 11/1-recommended Eliquis and aspirin   Essential hypertension: SBP in 130s and 140s. -On metoprolol as above. -Resume home medications as needed  Normocytic anemia: Hgb 9.2-improved. -Check anemia panel  Generalized weakness -PT/OT recommended CIR  NIDDM-2 CBG (last 3)  Recent Labs    11/14/18 0417 11/14/18 0750 11/14/18 1158  GLUCAP 95 86 165*  -Continue SSI -A1c 8 -Started on statin.      Nutrition Problem: Inadequate oral intake Etiology: inability to eat    DVT prophylaxis: On Eliquis for anticoagulation Code Status: Full code Family Communication: no family present Disposition Plan: Remains inpatient pending admission to CIR. Consultants: PCCM, neurology, cardiology  Procedures:  ETT 10/21-10/28  Microbiology summarized: 10/20-COVID-19 and MRSA PCR negative. 10/20-blood culture with pansensitive E. coli  10/20-urine culture with multiple species  Sch Meds:  Scheduled Meds: . apixaban  5 mg Oral BID  . aspirin  81 mg Oral Daily  . atorvastatin  40 mg Oral q1800  . cephALEXin  500 mg Oral Q8H  . chlorhexidine gluconate (MEDLINE KIT)  15 mL Mouth Rinse BID  . Chlorhexidine Gluconate Cloth  6 each Topical Daily  . feeding supplement (ENSURE ENLIVE)  237 mL Oral TID BM  . insulin aspart  0-5 Units Subcutaneous QHS  . insulin aspart  0-9 Units Subcutaneous TID WC  . insulin glargine  20 Units Subcutaneous Q24H  . metoprolol tartrate  50 mg Oral BID  . polyethylene glycol  17 g Per Tube Daily  . sennosides  5 mL Per Tube BID  . sodium chloride flush  3 mL Intravenous Q12H   Continuous Infusions: . sodium chloride Stopped (11/06/18 0751)   PRN Meds:.acetaminophen, acetaminophen, albuterol, bisacodyl, docusate, ondansetron (ZOFRAN) IV, Resource ThickenUp Clear, Resource ThickenUp Clear  Antimicrobials: Anti-infectives (From admission, onward)   Start     Dose/Rate Route Frequency Ordered Stop   11/13/18 1400  cephALEXin (KEFLEX) capsule 500 mg     500 mg Oral Every 8 hours 11/13/18 1211  11/15/18 0559   11/11/18 1011  cefTRIAXone (ROCEPHIN) 2 g in sodium chloride 0.9 % 100 mL IVPB  Status:  Discontinued     2 g 200 mL/hr over 30 Minutes Intravenous Every 24 hours 11/10/18 1206 11/10/18 1245   11/11/18 0600  ceFAZolin (ANCEF) IVPB 2g/100 mL premix  Status:  Discontinued     2 g 200 mL/hr over 30 Minutes Intravenous Every 8 hours 11/10/18 1245 11/13/18 1211   11/07/18 1000  cefTRIAXone (ROCEPHIN) 2 g in sodium chloride 0.9 % 100 mL IVPB  Status:  Discontinued     2 g 200 mL/hr over 30 Minutes Intravenous Every 12 hours 11/06/18 1809 11/10/18 1206   11/07/18 0700  vancomycin (VANCOCIN) IVPB 750 mg/150 ml premix  Status:  Discontinued     750 mg 150 mL/hr over 60 Minutes Intravenous Every 12 hours 11/06/18 1817 11/07/18 1115   11/06/18 1900  ampicillin (OMNIPEN) 2 g in sodium chloride 0.9 % 100 mL IVPB  Status:  Discontinued     2 g 300 mL/hr over 20 Minutes Intravenous Every 4 hours 11/06/18 1808 11/07/18 1115   11/06/18 1900  vancomycin (VANCOCIN) 1,500 mg in sodium chloride 0.9 % 500 mL IVPB  1,500 mg 250 mL/hr over 120 Minutes Intravenous  Once 11/06/18 1817 11/06/18 2117   11/06/18 1900  acyclovir (ZOVIRAX) 700 mg in dextrose 5 % 100 mL IVPB  Status:  Discontinued     700 mg 114 mL/hr over 60 Minutes Intravenous Every 8 hours 11/06/18 1817 11/07/18 1115   11/03/18 1800  vancomycin (VANCOCIN) 1,250 mg in sodium chloride 0.9 % 250 mL IVPB  Status:  Discontinued     1,250 mg 166.7 mL/hr over 90 Minutes Intravenous Every 48 hours 11/01/18 2228 11/02/18 0859   11/02/18 1700  ceFEPIme (MAXIPIME) 2 g in sodium chloride 0.9 % 100 mL IVPB  Status:  Discontinued     2 g 200 mL/hr over 30 Minutes Intravenous Every 24 hours 11/01/18 2228 11/02/18 0948   11/02/18 1700  cefTRIAXone (ROCEPHIN) 2 g in sodium chloride 0.9 % 100 mL IVPB  Status:  Discontinued     2 g 200 mL/hr over 30 Minutes Intravenous Every 24 hours 11/02/18 0948 11/06/18 1809   11/01/18 1630  vancomycin  (VANCOCIN) 1,500 mg in sodium chloride 0.9 % 500 mL IVPB     1,500 mg 250 mL/hr over 120 Minutes Intravenous  Once 11/01/18 1549 11/01/18 1954   11/01/18 1600  ceFEPIme (MAXIPIME) 2 g in sodium chloride 0.9 % 100 mL IVPB     2 g 200 mL/hr over 30 Minutes Intravenous  Once 11/01/18 1549 11/01/18 1736       I have personally reviewed the following labs and images: CBC: Recent Labs  Lab 11/10/18 0200 11/10/18 1311 11/11/18 0554 11/12/18 0348 11/13/18 0656 11/14/18 0233  WBC 16.8*  --  15.4* 12.7* 8.4 14.3*  HGB 8.0* 9.9* 8.1* 8.0* 8.8* 9.2*  HCT 25.5* 29.0* 25.8* 24.9* 28.3* 29.3*  MCV 69.7*  --  68.8* 67.8* 69.7* 69.8*  PLT 276  --  356 398 459* 559*   BMP &GFR Recent Labs  Lab 11/08/18 0209 11/09/18 0513 11/09/18 2010 11/10/18 0200 11/10/18 1311 11/13/18 0656 11/14/18 0233  NA 137 136 137 137 136 139 138  K 4.1 4.4 4.0 3.9 4.0 3.9 3.7  CL 102 95* 90* 92*  --  102 103  CO2 28 31 29 31   --  27 25  GLUCOSE 204* 193* 231* 239*  --  94 89  BUN 16 20 30* 35*  --  27* 18  CREATININE 0.63 0.82 0.96 1.00  --  0.71 0.62  CALCIUM 7.6* 8.5* 9.0 8.6*  --  8.4* 8.6*  MG 1.7 1.7 1.9 1.8  --  2.1 2.2  PHOS 3.8 3.6  --  5.4*  --   --   --    Estimated Creatinine Clearance: 58.1 mL/min (by C-G formula based on SCr of 0.62 mg/dL). Liver & Pancreas: Recent Labs  Lab 11/08/18 0209 11/09/18 0513 11/10/18 0200  ALBUMIN 1.7* 2.2* 2.3*   No results for input(s): LIPASE, AMYLASE in the last 168 hours. No results for input(s): AMMONIA in the last 168 hours. Diabetic: Recent Labs    11/14/18 0233  HGBA1C 8.0*   Recent Labs  Lab 11/13/18 2047 11/14/18 0011 11/14/18 0417 11/14/18 0750 11/14/18 1158  GLUCAP 219* 83 95 86 165*   Cardiac Enzymes: No results for input(s): CKTOTAL, CKMB, CKMBINDEX, TROPONINI in the last 168 hours. No results for input(s): PROBNP in the last 8760 hours. Coagulation Profile: No results for input(s): INR, PROTIME in the last 168 hours. Thyroid  Function Tests: No results for input(s): TSH, T4TOTAL, FREET4, T3FREE, THYROIDAB  in the last 72 hours. Lipid Profile: No results for input(s): CHOL, HDL, LDLCALC, TRIG, CHOLHDL, LDLDIRECT in the last 72 hours. Anemia Panel: Recent Labs    11/13/18 1249  VITAMINB12 2,569*  FOLATE 21.6  FERRITIN 314*  TIBC 304  IRON 61  RETICCTPCT 3.2*   Urine analysis:    Component Value Date/Time   COLORURINE YELLOW 11/04/2018 0841   APPEARANCEUR CLEAR 11/04/2018 0841   LABSPEC 1.020 11/04/2018 0841   PHURINE 5.0 11/04/2018 0841   GLUCOSEU NEGATIVE 11/04/2018 0841   HGBUR MODERATE (A) 11/04/2018 0841   BILIRUBINUR NEGATIVE 11/04/2018 0841   BILIRUBINUR negative 05/23/2015 1120   KETONESUR 80 (A) 11/04/2018 0841   PROTEINUR 100 (A) 11/04/2018 0841   UROBILINOGEN 0.2 05/23/2015 1120   UROBILINOGEN 0.2 12/14/2013 1203   NITRITE NEGATIVE 11/04/2018 0841   LEUKOCYTESUR NEGATIVE 11/04/2018 0841   Sepsis Labs: Invalid input(s): PROCALCITONIN, [place]  Microbiology: No results found for this or any previous visit (from the past 240 hour(s)).  Radiology Studies: No results found.  Time spent: 56mns  JKathie Dike MD [facility]  If 7PM-7AM, please contact night-coverage [email or web address] Password [facility]11/02/2018, 2:31 PM

## 2018-11-14 NOTE — Progress Notes (Signed)
Patient resting comfortably on room air. No respiratory distress noted. BIPAP not needed at this time. RT will monitor as needed. ?

## 2018-11-14 NOTE — Progress Notes (Signed)
Physical Therapy Treatment Patient Details Name: Yariana Hoaglund MRN: [id number] DOB: Sep 01, 1950 Today's Date: 11/14/2018    History of Present Illness 68 y.o. F with PMH of DM and HTN who presents with 2-3 days of fever, malaise and fatigue.  She was found to have probable bilateral PNA and UTI in the ED with lactic acidosis and required Bipap. Intubated 10/21-29, now on Bipap. TDV:[id number] diffusion, swelling and edema within the callosal bodyand splenium as well as left cingulate gyrus. Additional punctate focus of restricted diffusion and edema within the left globus pallidus. Findings may reflect acute ischemic infarcts. However,toxic/metabolic etiologies should also be considered. Acute left cerebellar infarct    PT Comments    Pt was seen with daughter in attendance, obtained video translation for the session.  Pt is good about following instructions but was having significant HA pain during session.  Called nursing in and she was given Tylenol during the session.  Follow acutely for increasing her sitting and standing balance control, to increase use of RLE with gait and ex, and to work on pt awareness of use of R side during therapy and to follow up mobility.     Follow Up Recommendations  CIR     Equipment Recommendations  Other (comment)    Recommendations for Other Services Rehab consult     Precautions / Restrictions Precautions Precautions: Fall Precaution Comments: R side instability standing Restrictions Weight Bearing Restrictions: No    Mobility  Bed Mobility Overal bed mobility: Needs Assistance Bed Mobility: Supine to Sit;Sit to Supine     Supine to sit: Mod assist Sit to supine: Mod assist   General bed mobility comments: better assist by pt today, mainly assisting trunk and finishing scooting out to edge of bed, then lifting legs back to bed  Transfers Overall transfer level: Needs assistance Equipment used: Rolling walker (2 wheeled);2 person hand  held assist Transfers: Sit to/from Stand Sit to Stand: Mod assist;+2 physical assistance;+2 safety/equipment;From elevated surface            Ambulation/Gait Ambulation/Gait assistance: Min assist;+2 physical assistance;+2 safety/equipment Gait Distance (Feet): 10 Feet(5 x 2) Assistive device: Rolling walker (2 wheeled);2 person hand held assist Gait Pattern/deviations: Step-to pattern;Decreased stride length;Wide base of support;Trunk flexed Gait velocity: reduced   General Gait Details: weak to support on RLE but sidesteps on side of bed   Stairs             Wheelchair Mobility    Modified Rankin (Stroke Patients Only)       Balance Overall balance assessment: Needs assistance Sitting-balance support: Bilateral upper extremity supported;Feet supported Sitting balance-Leahy Scale: Fair Sitting balance - Comments: leaning back with hip flexion exercises Postural control: Posterior lean Standing balance support: Bilateral upper extremity supported;During functional activity Standing balance-Leahy Scale: Poor                              Cognition Arousal/Alertness: Awake/alert Behavior During Therapy: Flat affect Overall Cognitive Status: Impaired/Different from baseline Area of Impairment: Following commands;Problem solving;Awareness                       Following Commands: Follows one step commands inconsistently;Follows one step commands with increased time   Awareness: Intellectual Problem Solving: Slow processing;Requires verbal cues General Comments: slow to follow interpreted instructions but responded well to questions      Exercises General Exercises - Lower Extremity Long Arc Quad: Strengthening;10  reps Heel Slides: Strengthening;10 reps Hip ABduction/ADduction: AAROM;10 reps Hip Flexion/Marching: AAROM;10 reps    General Comments General comments (skin integrity, edema, etc.): pt was up to side of bed with encouragement  and then stood, noted headache and meds were requested      Pertinent Vitals/Pain Pain Assessment: 0-10 Pain Score: 5  Pain Location: headache Pain Descriptors / Indicators: Aching;Tender Pain Intervention(s): Monitored during session;Repositioned;RN gave pain meds during session    Home Living Family/patient expects to be discharged to:: Private residence                    Prior Function            PT Goals (current goals can now be found in the care plan section) Acute Rehab PT Goals Patient Stated Goal: none stated Progress towards PT goals: Progressing toward goals    Frequency    Min 4X/week      PT Plan Current plan remains appropriate    Co-evaluation              AM-PAC PT "6 Clicks" Mobility   Outcome Measure  Help needed turning from your back to your side while in a flat bed without using bedrails?: A Little Help needed moving from lying on your back to sitting on the side of a flat bed without using bedrails?: A Lot Help needed moving to and from a bed to a chair (including a wheelchair)?: A Lot Help needed standing up from a chair using your arms (e.g., wheelchair or bedside chair)?: A Lot Help needed to walk in hospital room?: A Lot Help needed climbing 3-5 steps with a railing? : Total 6 Click Score: 12    End of Session Equipment Utilized During Treatment: Gait belt Activity Tolerance: Patient tolerated treatment well;Patient limited by pain;Patient limited by fatigue Patient left: in bed;with call bell/phone within reach;with bed alarm set;with family/visitor present Nurse Communication: Mobility status PT Visit Diagnosis: Unsteadiness on feet (R26.81);Other abnormalities of gait and mobility (R26.89);Muscle weakness (generalized) (M62.81);Difficulty in walking, not elsewhere classified (R26.2);Other symptoms and signs involving the nervous system (R29.898)     Time: 1540-1601 PT Time Calculation (min) (ACUTE ONLY): 21  min  Charges:  $Gait Training: 8-22 mins                    Ramond Dial 11/14/2018, 4:35 PM   Mee Hives, PT MS Acute Rehab Dept. Number: [place]

## 2018-11-14 NOTE — Consult Note (Signed)
Inpatient Rehabilitation Admissions Coordinator  Inpatient rehab consult received. I met with patient with her daughter at bedside with grandson interpreting via conference call. Patient able to move all extremities when asked. Daughter reports pt up to [facility] with her assist this morning. I discussed goals an expectations of an inpt rehab admit and daughter is requesting admit. I await PT updated therapy assessment today and then will begin insurance authorization for a possible CIR admit. I have notified Ruth<PT of my request. I will follow up tomorrow.  Danne Baxter, RN, MSN Rehab Admissions Coordinator [phone] 11/14/2018 2:53 PM

## 2018-11-14 NOTE — TOC Benefit Eligibility Note (Signed)
Transition of Care [facility]) Benefit Eligibility Note    Patient Details  Name: Aimee Parker MRN: [id number] Date of Birth: 10-14-1950   Medication/Dose: Eliquis 5mg  2 a day  Covered?: Yes  Tier: 3 Drug  Prescription Coverage Preferred Pharmacy: [facility]  Spoke with Person/Company/Phone Number:: Sara/[employer]/ [phone]  Co-Pay: 445.00 for a 30 day Supply  Prior Approval: No  Deductible: (Patient is in [place] hold)  Additional Notes: Apixaban is not on the [place] ---Warfarin 5mg  is 290.40 for a 30 day Supply High copayment because patient is the donut hold  Donut hole  [employer] Phone Number: 11/14/2018, 4:20 PM

## 2018-11-14 NOTE — Progress Notes (Signed)
Patient's HR fluctuating between 58-62 bpm. RN paged [place],NP about giving patient scheduled 50mg  of metoprolol. Patriciaann Clan returned page and stated that she had changed the dose of metoprolol from 50mg  to 25mg  and since the patient had difficulties with Afib RVR this admission, the patient should receive the new dose of metoprolol. This RN verified this order with the NP and she stated to go ahead and give the metoprolol with a HR in the 50's. Will proceed with NP's order.

## 2018-11-14 NOTE — Care Management Important Message (Signed)
Important Message  Patient Details  Name: Aimee Parker MRN: [id number] Date of Birth: 09-12-1950   Medicare Important Message Given:  Yes     Heena Woodbury Montine Circle 11/14/2018, 4:45 PM

## 2018-11-14 NOTE — TOC Progression Note (Signed)
Transition of Care [facility]) - Progression Note    Patient Details  Name: Aimee Parker MRN: [id number] Date of Birth: 01-12-1951  Transition of Care [facility]) CM/SW Contact  Sharin Mons, RN Phone Number: 11/14/2018, 10:06 AM  Clinical Narrative:    Admitted with sepsis 2/2 UTI and PNA, hx of HTN and DM.   Hospital course: acute respiratory failure due to pulmonary edema, sepsis with E. coli bacteremia and pyelonephritis, cerebellar and diffuse cortical and subcortical infarcts, a. Fib RVR.  Pt transferred to 5W, 11/1.  Speaks [place]. Pt from home with family ( son , daughter in law, 2 gran-children and significant other) Son states PTA independent with ADL's, no DME usage.  Aimee Parker (Son)     [phone]      CIR to evaluate for IPR.... TOC will continue to follow for needs ...  Expected Discharge Plan: IP Rehab Facility Barriers to Discharge: Other (comment)(CIR to evaluate for placement)  Expected Discharge Plan and Services Expected Discharge Plan: [place]       Living arrangements for the past 2 months: Single Family Home                                       Social Determinants of Health (SDOH) Interventions    Readmission Risk Interventions No flowsheet data found.

## 2018-11-14 NOTE — TOC Progression Note (Signed)
Transition of Care [facility]) - Progression Note    Patient Details  Name: Aimee Parker MRN: [id number] Date of Birth: Mar 31, 1950  Transition of Care [facility]) CM/SW Contact  Sharin Mons, RN Phone Number: 11/14/2018, 1:05 PM  Clinical Narrative:      Benefits check for Apixaban 5mg  BID in process. TOC team will f/u with results...  Expected Discharge Plan: IP Rehab Facility Barriers to Discharge: Other (comment)(CIR to evaluate for placement)  Expected Discharge Plan and Services Expected Discharge Plan: [place]       Living arrangements for the past 2 months: Single Family Home                                       Social Determinants of Health (SDOH) Interventions    Readmission Risk Interventions No flowsheet data found.

## 2018-11-14 NOTE — Progress Notes (Signed)
  Speech Language Pathology Treatment: Dysphagia  Patient Details Name: Aimee Parker MRN: [id number] DOB: [birth year] Today's Date: 11/14/2018 Time: 6195-0932 SLP Time Calculation (min) (ACUTE ONLY): 10 min  Assessment / Plan / Recommendation Clinical Impression  Pt seen to determine readiness to advance diet.  Daughter present.  Pt repositioned in bed to optimize safety.  Coughing frequently at baseline today.  On RA and oxygenating well. Provided with trials of thin liquid, after which she continued to cough, making it difficult to discern intolerance of liquids.  Voice clear, good quality.  Recommend continuing dysphagia 1/nectars for now; pt may benefit from instrumental swallow study if clinical signs remain equivocal. SLP will follow.   HPI HPI: 68 y.o.F with PMH of DM and HTN who presents with 2-3 days of fever, malaise and fatigue. She was found to have probable bilateral PNA and UTI. Developed worsening respiratory distress with increased work of breathing and accessory muscle use requiring endotracheal intubation 11/02/2018-10/29. Post extubation stridor, improved somewhat with steroids and epinephrine. Still present suggesting vocal cord dysfunction per MD.      SLP Plan  Continue with current plan of care       Recommendations  Diet recommendations: Dysphagia 1 (puree);Nectar-thick liquid Liquids provided via: Cup;Straw Medication Administration: Whole meds with puree Supervision: Patient able to self feed Compensations: Slow rate;Small sips/bites Postural Changes and/or Swallow Maneuvers: Seated upright 90 degrees                Oral Care Recommendations: Oral care BID Follow up Recommendations: Inpatient Rehab SLP Visit Diagnosis: Dysphagia, oropharyngeal phase (R13.12) Plan: Continue with current plan of care       GO                Juan Quam Laurice 11/14/2018, 2:48 PM Jennfier Abdulla L. Tivis Ringer, [place] Office number  [phone] Pager [phone]

## 2018-11-15 ENCOUNTER — Encounter (HOSPITAL_COMMUNITY): Payer: Self-pay

## 2018-11-15 ENCOUNTER — Other Ambulatory Visit: Payer: Self-pay

## 2018-11-15 ENCOUNTER — Inpatient Hospital Stay (HOSPITAL_COMMUNITY)
Admission: RE | Admit: 2018-11-15 | Discharge: 2018-11-22 | DRG: 057 | Disposition: A | Discharge status: Home / Self Care | Payer: Medicare Other | Source: Intra-hospital | Attending: Physical Medicine & Rehabilitation | Admitting: Physical Medicine & Rehabilitation

## 2018-11-15 DIAGNOSIS — I4891 Unspecified atrial fibrillation: Secondary | ICD-10-CM | POA: Diagnosis present

## 2018-11-15 DIAGNOSIS — N179 Acute kidney failure, unspecified: Secondary | ICD-10-CM | POA: Diagnosis present

## 2018-11-15 DIAGNOSIS — E119 Type 2 diabetes mellitus without complications: Secondary | ICD-10-CM | POA: Diagnosis present

## 2018-11-15 DIAGNOSIS — I69393 Ataxia following cerebral infarction: Secondary | ICD-10-CM

## 2018-11-15 DIAGNOSIS — I10 Essential (primary) hypertension: Secondary | ICD-10-CM | POA: Diagnosis present

## 2018-11-15 DIAGNOSIS — R059 Cough, unspecified: Secondary | ICD-10-CM | POA: Diagnosis present

## 2018-11-15 DIAGNOSIS — E559 Vitamin D deficiency, unspecified: Secondary | ICD-10-CM | POA: Diagnosis present

## 2018-11-15 DIAGNOSIS — Z7901 Long term (current) use of anticoagulants: Secondary | ICD-10-CM | POA: Diagnosis not present

## 2018-11-15 DIAGNOSIS — R05 Cough: Secondary | ICD-10-CM | POA: Diagnosis present

## 2018-11-15 DIAGNOSIS — I69318 Other symptoms and signs involving cognitive functions following cerebral infarction: Secondary | ICD-10-CM | POA: Diagnosis present

## 2018-11-15 DIAGNOSIS — Z7982 Long term (current) use of aspirin: Secondary | ICD-10-CM | POA: Diagnosis not present

## 2018-11-15 DIAGNOSIS — I639 Cerebral infarction, unspecified: Secondary | ICD-10-CM | POA: Diagnosis present

## 2018-11-15 DIAGNOSIS — Z794 Long term (current) use of insulin: Secondary | ICD-10-CM | POA: Diagnosis not present

## 2018-11-15 DIAGNOSIS — E782 Mixed hyperlipidemia: Secondary | ICD-10-CM

## 2018-11-15 DIAGNOSIS — Z79899 Other long term (current) drug therapy: Secondary | ICD-10-CM

## 2018-11-15 DIAGNOSIS — R1310 Dysphagia, unspecified: Secondary | ICD-10-CM | POA: Diagnosis present

## 2018-11-15 DIAGNOSIS — K5900 Constipation, unspecified: Secondary | ICD-10-CM | POA: Diagnosis present

## 2018-11-15 DIAGNOSIS — I63119 Cerebral infarction due to embolism of unspecified vertebral artery: Secondary | ICD-10-CM | POA: Diagnosis not present

## 2018-11-15 DIAGNOSIS — I6349 Cerebral infarction due to embolism of other cerebral artery: Secondary | ICD-10-CM

## 2018-11-15 LAB — BASIC METABOLIC PANEL
Anion gap: 10 (ref 5–15)
BUN: 15 mg/dL (ref 8–23)
CO2: 23 mmol/L (ref 22–32)
Calcium: 8.5 mg/dL — ABNORMAL LOW (ref 8.9–10.3)
Chloride: 104 mmol/L (ref 98–111)
Creatinine, Ser: 0.72 mg/dL (ref 0.44–1.00)
GFR calc Af Amer: >60 mL/min (ref >60)
GFR calc non Af Amer: >60 mL/min (ref >60)
Glucose, Bld: 128 mg/dL — ABNORMAL HIGH (ref 70–99)
Potassium: 4.1 mmol/L (ref 3.5–5.1)
Sodium: 137 mmol/L (ref 135–145)

## 2018-11-15 LAB — CBC
HCT: 27.9 % — ABNORMAL LOW (ref 36.0–46.0)
Hemoglobin: 8.5 g/dL — ABNORMAL LOW (ref 12.0–15.0)
MCH: 21.5 pg — ABNORMAL LOW (ref 26.0–34.0)
MCHC: 30.5 g/dL (ref 30.0–36.0)
MCV: 70.6 fL — ABNORMAL LOW (ref 80.0–100.0)
Platelets: 634 10*3/uL — ABNORMAL HIGH (ref 150–400)
RBC: 3.95 MIL/uL (ref 3.87–5.11)
RDW: 15.9 % — ABNORMAL HIGH (ref 11.5–15.5)
WBC: 14.3 10*3/uL — ABNORMAL HIGH (ref 4.0–10.5)
nRBC: 0 % (ref 0.0–0.2)

## 2018-11-15 LAB — GLUCOSE, CAPILLARY
Glucose-Capillary: 107 mg/dL — ABNORMAL HIGH (ref 70–99)
Glucose-Capillary: 133 mg/dL — ABNORMAL HIGH (ref 70–99)
Glucose-Capillary: 145 mg/dL — ABNORMAL HIGH (ref 70–99)
Glucose-Capillary: 116 mg/dL — ABNORMAL HIGH (ref 70–99)

## 2018-11-15 LAB — MAGNESIUM: Magnesium: 2.1 mg/dL (ref 1.7–2.4)

## 2018-11-15 MED ORDER — POLYETHYLENE GLYCOL 3350 17 G PO PACK: 17.0000 g | PACK | Freq: Every day | ORAL | Status: DC | PRN

## 2018-11-15 MED ORDER — BISACODYL 10 MG RE SUPP: 10.0000 mg | Freq: Every day | RECTAL | Status: DC | PRN

## 2018-11-15 MED ORDER — DIPHENHYDRAMINE HCL 12.5 MG/5ML PO ELIX: 12.5000 mg | ORAL_SOLUTION | Freq: Four times a day (QID) | ORAL | Status: DC | PRN

## 2018-11-15 MED ORDER — INSULIN GLARGINE 100 UNIT/ML ~~LOC~~ SOLN
10.0000 [IU] | SUBCUTANEOUS | Status: DC
  Administered 2018-11-16 – 2018-11-22 (×7): 10 [IU] via SUBCUTANEOUS
  Filled 2018-11-15 (×8): qty 0.1

## 2018-11-15 MED ORDER — PROCHLORPERAZINE EDISYLATE 10 MG/2ML IJ SOLN: 5.0000 mg | Freq: Four times a day (QID) | INTRAMUSCULAR | Status: DC | PRN

## 2018-11-15 MED ORDER — METOPROLOL TARTRATE 25 MG PO TABS: 25.0000 mg | ORAL_TABLET | Freq: Two times a day (BID) | ORAL | Status: DC

## 2018-11-15 MED ORDER — FLEET ENEMA 7-19 GM/118ML RE ENEM: 1.0000 | ENEMA | Freq: Once | RECTAL | Status: DC | PRN

## 2018-11-15 MED ORDER — SENNA 8.6 MG PO TABS
1.0000 | ORAL_TABLET | Freq: Two times a day (BID) | ORAL | Status: DC
  Administered 2018-11-16 – 2018-11-22 (×10): 8.6 mg via ORAL
  Filled 2018-11-15 (×13): qty 1

## 2018-11-15 MED ORDER — CHLORHEXIDINE GLUCONATE 0.12% ORAL RINSE (MEDLINE KIT)
15.0000 mL | Freq: Two times a day (BID) | OROMUCOSAL | Status: DC
  Administered 2018-11-16 – 2018-11-22 (×12): 15 mL via OROMUCOSAL

## 2018-11-15 MED ORDER — INSULIN GLARGINE 100 UNIT/ML ~~LOC~~ SOLN: 20.0000 [IU] | SUBCUTANEOUS | 11 refills | Status: DC

## 2018-11-15 MED ORDER — ACETAMINOPHEN 325 MG PO TABS
325.0000 mg | ORAL_TABLET | ORAL | Status: DC | PRN
  Administered 2018-11-15: 650 mg via ORAL
  Filled 2018-11-15: qty 2

## 2018-11-15 MED ORDER — INSULIN ASPART 100 UNIT/ML ~~LOC~~ SOLN
0.0000 [IU] | Freq: Three times a day (TID) | SUBCUTANEOUS | Status: DC
  Administered 2018-11-16: 1 [IU] via SUBCUTANEOUS
  Administered 2018-11-17: 2 [IU] via SUBCUTANEOUS
  Administered 2018-11-18: 1 [IU] via SUBCUTANEOUS
  Administered 2018-11-18: 3 [IU] via SUBCUTANEOUS
  Administered 2018-11-19: 1 [IU] via SUBCUTANEOUS
  Administered 2018-11-19 (×2): 2 [IU] via SUBCUTANEOUS
  Administered 2018-11-20 (×2): 1 [IU] via SUBCUTANEOUS
  Administered 2018-11-20: 3 [IU] via SUBCUTANEOUS
  Administered 2018-11-21: 1 [IU] via SUBCUTANEOUS
  Administered 2018-11-21: 2 [IU] via SUBCUTANEOUS

## 2018-11-15 MED ORDER — ALUM & MAG HYDROXIDE-SIMETH 200-200-20 MG/5ML PO SUSP: 30.0000 mL | ORAL | Status: DC | PRN

## 2018-11-15 MED ORDER — INSULIN ASPART 100 UNIT/ML ~~LOC~~ SOLN: 0.0000 [IU] | Freq: Every day | SUBCUTANEOUS | Status: DC

## 2018-11-15 MED ORDER — CHLORHEXIDINE GLUCONATE CLOTH 2 % EX PADS
6.0000 | MEDICATED_PAD | Freq: Every day | CUTANEOUS | Status: DC
  Administered 2018-11-16: 6 via TOPICAL

## 2018-11-15 MED ORDER — ASPIRIN 81 MG PO CHEW
81.0000 mg | CHEWABLE_TABLET | Freq: Every day | ORAL | Status: DC
  Administered 2018-11-16 – 2018-11-22 (×7): 81 mg via ORAL
  Filled 2018-11-15 (×7): qty 1

## 2018-11-15 MED ORDER — APIXABAN 5 MG PO TABS: 5.0000 mg | ORAL_TABLET | Freq: Two times a day (BID) | ORAL | Status: DC

## 2018-11-15 MED ORDER — LINAGLIPTIN 5 MG PO TABS
5.0000 mg | ORAL_TABLET | Freq: Every day | ORAL | Status: DC
  Administered 2018-11-16 – 2018-11-22 (×7): 5 mg via ORAL
  Filled 2018-11-15 (×7): qty 1

## 2018-11-15 MED ORDER — ALBUTEROL SULFATE (2.5 MG/3ML) 0.083% IN NEBU: 2.5000 mg | INHALATION_SOLUTION | RESPIRATORY_TRACT | 12 refills | Status: DC | PRN

## 2018-11-15 MED ORDER — PROCHLORPERAZINE MALEATE 5 MG PO TABS: 5.0000 mg | ORAL_TABLET | Freq: Four times a day (QID) | ORAL | Status: DC | PRN

## 2018-11-15 MED ORDER — ASPIRIN 81 MG PO CHEW: 81.0000 mg | CHEWABLE_TABLET | Freq: Every day | ORAL | Status: DC

## 2018-11-15 MED ORDER — INSULIN ASPART 100 UNIT/ML ~~LOC~~ SOLN: 0.0000 [IU] | Freq: Three times a day (TID) | SUBCUTANEOUS | 11 refills | Status: DC

## 2018-11-15 MED ORDER — APIXABAN 5 MG PO TABS
5.0000 mg | ORAL_TABLET | Freq: Two times a day (BID) | ORAL | Status: DC
  Administered 2018-11-15 – 2018-11-22 (×14): 5 mg via ORAL
  Filled 2018-11-15 (×14): qty 1

## 2018-11-15 MED ORDER — BENZONATATE 100 MG PO CAPS
100.0000 mg | ORAL_CAPSULE | Freq: Three times a day (TID) | ORAL | Status: DC
  Administered 2018-11-15 – 2018-11-22 (×20): 100 mg via ORAL
  Filled 2018-11-15 (×21): qty 1

## 2018-11-15 MED ORDER — IPRATROPIUM-ALBUTEROL 0.5-2.5 (3) MG/3ML IN SOLN
3.0000 mL | Freq: Three times a day (TID) | RESPIRATORY_TRACT | Status: DC
  Administered 2018-11-15 – 2018-11-16 (×2): 3 mL via RESPIRATORY_TRACT
  Filled 2018-11-15 (×2): qty 3

## 2018-11-15 MED ORDER — RESOURCE THICKENUP CLEAR PO POWD
ORAL | Status: DC | PRN
  Filled 2018-11-15: qty 125

## 2018-11-15 MED ORDER — GUAIFENESIN-DM 100-10 MG/5ML PO SYRP: 5.0000 mL | ORAL_SOLUTION | Freq: Four times a day (QID) | ORAL | Status: DC | PRN

## 2018-11-15 MED ORDER — METOPROLOL TARTRATE 25 MG PO TABS
25.0000 mg | ORAL_TABLET | Freq: Two times a day (BID) | ORAL | Status: DC
  Administered 2018-11-15 – 2018-11-22 (×14): 25 mg via ORAL
  Filled 2018-11-15 (×14): qty 1

## 2018-11-15 MED ORDER — ATORVASTATIN CALCIUM 40 MG PO TABS
40.0000 mg | ORAL_TABLET | Freq: Every day | ORAL | Status: DC
  Administered 2018-11-16 – 2018-11-21 (×6): 40 mg via ORAL
  Filled 2018-11-15 (×6): qty 1

## 2018-11-15 MED ORDER — PROCHLORPERAZINE 25 MG RE SUPP: 12.5000 mg | Freq: Four times a day (QID) | RECTAL | Status: DC | PRN

## 2018-11-15 MED ORDER — INSULIN ASPART 100 UNIT/ML ~~LOC~~ SOLN: 0.0000 [IU] | Freq: Every day | SUBCUTANEOUS | 11 refills | Status: DC

## 2018-11-15 NOTE — H&P (Signed)
Physical Medicine and Rehabilitation Admission H&P    Chief Complaint  Patient presents with  . Embolic stroke.    HPI: Aimee Parker is a 68 year old female with history of T2DM, HTN, vitamin D deficiency who was admitted on 11/01/18 with two day history of fevers up to 103, fatigue, dyspnea and AMS. She was placed on BIPAP and started on broad spectrum antibiotics but continued to have increased WOB with tachypnea intubation. CTA negative for PE and she was found to have AKI with metabolic acidosis and elevated troponins due to demand ischemia.  She developed septic shock due to E coli bacteremia.  CT abdomen/pelvis showed evidence of pyelonephritis with moderate left perinephritic edema and mild left ureter prominence. Blood cultures X 2 positive for Enterobacteriaceae and E coli. 2  D echo showed moderate LVH with EF 60-65% and question of PFO. AKI treated with IVF for hydration and thrombocytopenia felt to be due to sepsis. On 10/25, she was found to have decrease in LOC with diffuse weakness and inability to follow commands. CT head done revealing hypodensity in left posterior cerebellums and extensive low density in body and splenium of corpus callosum. CTA head/neck showed occlusion of left ACA proximal A2 segment, short segment occlusion proximal L-PCA and severe short segment stenosis in L-VA.  She continued to have fevers with concerns of meningitis and ID consulted for input on septic emboli due to endocarditis. MRI brain showed restricted diffusion with swelling and edema within callosal body and splenium, left cingulate gyrus, left globus pallidus, acute ischemic infarct in left cerebellum and bilateral mastoid effusions large on right with fluid in right eustachian tube.   Dr. Leonie Man felt that stroke embolic due to sepsis v/s paradoxical embolism.  TEE done revealing EF 55-60% with no thrombus, mass,shunt or PFO/ASD. BLE dopplers negative for DVT. Her mentation continued to improve with  increase in ability to follow commands and Dr. Baxter Flattery recommended 10 day course of antibiotics with 10/23 as day 1. Patient developed bursts of SVT with A fib on 10/28 and was started on amiodarone with IV heparin. She converted to NSR and tolerated extubation on 10/29 but noted to have post extubation stridor requiring steroids as well as epi and placed briefly on BIPAP. Beside swallow done and patient started on dysphagia 1, nectars due to SOB and dysphonia.  She has been transitioned to Eliquis and completed her antibiotic course. Has had issues with asymptomatic bradycardia with down tol HRs 40-50. She continues to be limited by dysphonia with DOE, fatigue,  RLE weakness and right body inattention. CIR recommended due to functional decline.   Spoke with patient and her daughter at bedside, and her brother by phone.     Review of Systems  Constitutional: Negative for chills and fever.  HENT: Negative for hearing loss and tinnitus.   Eyes: Negative for blurred vision and double vision.  Respiratory: Positive for cough, sputum production and shortness of breath.   Cardiovascular: Positive for chest pain (with cough ). Negative for palpitations.  Gastrointestinal: Negative for constipation (last BM in early am per daughter), heartburn and nausea.  Genitourinary: Negative for dysuria and urgency.  Musculoskeletal: Positive for joint pain (chronic knee pain occaionally). Negative for myalgias.  Skin: Negative for rash.  Neurological: Positive for weakness and headaches.  Psychiatric/Behavioral: The patient does not have insomnia.      Past Medical History:  Diagnosis Date  . Diabetes mellitus without complication ([place])   . Hypertension   .  Vitamin D deficiency     Past Surgical History:  Procedure Laterality Date  . EYE SURGERY     Left eye surgery  . PARS PLANA VITRECTOMY Right 05/13/2017   Procedure: PARS PLANA VITRECTOMY 25 GAUGE FOR ENDOPHTHALMITIS;  Surgeon: Hurman Horn, MD;   Location: [facility];  Service: Ophthalmology;  Laterality: Right;    Family History  Problem Relation Age of Onset  . Hypertension Mother   . Hypertension Father     Social History:  Married. Lives with family. 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 Walks daily and does some housework. She reports that she has never smoked. She has never used smokeless tobacco. She reports that she does not drink alcohol or use drugs.    Allergies: No Known Allergies    Medications Prior to Admission  Medication Sig Dispense Refill  . Accu-Chek Softclix Lancets lancets Use as instructed. Check blood glucose levels twice per day by fingerstick 200 each 3  . amLODipine (NORVASC) 10 MG tablet Take 1 tablet (10 mg total) by mouth daily. 90 tablet 3  . atorvastatin (LIPITOR) 40 MG tablet Take 1 tablet (40 mg total) by mouth daily. 90 tablet 0  . Blood Glucose Monitoring Suppl (ACCU-CHEK AVIVA PLUS) w/Device KIT 1 each by Does not apply route 3 (three) times daily. METER STOPPED WORKING 1 kit 0  . glimepiride (AMARYL) 2 MG tablet Take 1 tablet (2 mg total) by mouth daily before breakfast. 90 tablet 1  . glucose blood (ACCU-CHEK AVIVA) test strip Use as instructed. Check blood glucose levels twice per day by fingerstick 200 each 3  . glucose blood test strip Provide patient with insurance approved strips. Use as instructed. Inject into the skin once daily. 100 each 12  . hydrochlorothiazide (HYDRODIURIL) 25 MG tablet Take 1 tablet (25 mg total) by mouth daily. 90 tablet 3  . lisinopril (ZESTRIL) 40 MG tablet Take 1 tablet (40 mg total) by mouth daily. 90 tablet 3  . sitaGLIPtin-metformin (JANUMET) 50-1000 MG tablet Take 1 tablet by mouth 2 (two) times daily with a meal. 180 tablet 0  . Vitamin D, Ergocalciferol, (DRISDOL) 1.25 MG (50000 UT) CAPS capsule Take 1 capsule (50,000 Units total) by mouth every 7 (seven) days. 12 capsule 3    Drug Regimen Review  Drug regimen  was reviewed and remains appropriate with no significant issues identified  Home: Home Living Family/patient expects to be discharged to:: Private residence Living Arrangements: Spouse/significant other, Children(husband and daughter) Available Help at Discharge: Family, Available 24 hours/day Type of Home: House Home Access: Stairs to enter Technical brewer of Steps: 2 Entrance Stairs-Rails: None Home Layout: One level Bathroom Shower/Tub: Multimedia programmer: Standard Home Equipment: None   Functional History: Prior Function Level of Independence: Independent  Functional Status:  Mobility: Bed Mobility Overal bed mobility: Needs Assistance Bed Mobility: Supine to Sit, Sit to Supine Supine to sit: Mod assist Sit to supine: Mod assist General bed mobility comments: better assist by pt today, mainly assisting trunk and finishing scooting out to edge of bed, then lifting legs back to bed Transfers Overall transfer level: Needs assistance Equipment used: Rolling walker (2 wheeled), 2 person hand held assist Transfers: Sit to/from Stand Sit to Stand: Mod assist, +2 physical assistance, +2 safety/equipment, From elevated surface Squat pivot transfers: Total assist, +2 physical assistance General transfer comment: Use of bed pad and gait belt going to pt's left Ambulation/Gait Ambulation/Gait assistance: Min assist, +2  physical assistance, +2 safety/equipment Gait Distance (Feet): 10 Feet(5 x 2) Assistive device: Rolling walker (2 wheeled), 2 person hand held assist Gait Pattern/deviations: Step-to pattern, Decreased stride length, Wide base of support, Trunk flexed General Gait Details: weak to support on RLE but sidesteps on side of bed Gait velocity: reduced    ADL: ADL Overall ADL's : Needs assistance/impaired Eating/Feeding: Total assistance, Bed level, Sitting Grooming: Total assistance, Bed level, Sitting Upper Body Bathing: Total assistance, Bed  level, Sitting Lower Body Bathing: Total assistance, Bed level Upper Body Dressing : Total assistance, Bed level, Sitting Lower Body Dressing: Total assistance, Bed level Toilet Transfer: +2 for physical assistance, Total assistance, Squat-pivot Toilet Transfer Details (indicate cue type and reason): bed>recliner going to pt's right with use of bed pad and gait belt Toileting- Clothing Manipulation and Hygiene: Total assistance, Bed level  Cognition: Cognition Overall Cognitive Status: Impaired/Different from baseline Arousal/Alertness: Awake/alert Orientation Level: Oriented to person, Oriented to place, Oriented to situation, Disoriented to time Attention: Focused, Sustained, Selective Focused Attention: Appears intact Sustained Attention: Appears intact Selective Attention: Appears intact Memory: Appears intact Awareness: Appears intact Cognition Arousal/Alertness: Awake/alert Behavior During Therapy: Flat affect Overall Cognitive Status: Impaired/Different from baseline Area of Impairment: Following commands, Problem solving, Awareness Following Commands: Follows one step commands inconsistently, Follows one step commands with increased time Awareness: Intellectual Problem Solving: Slow processing, Requires verbal cues General Comments: slow to follow interpreted instructions but responded well to questions   Physical Exam  Nursing note and vitals reviewed. Constitutional: She is oriented to person, place, and time. She appears well-developed and well-nourished.  Easily fatigued and closed eyes by the end of exam.   Respiratory: Stridor present. No respiratory distress. She exhibits tenderness.  Frequent congested but dry cough.   Musculoskeletal:        General: No tenderness or edema.     She had some difficulty following exam due to cognitive impairment/language barrier, but appears that she has at at least 4/5 strength throughout her upper and lower extremities.  +  Impaired heel to toe and finger to nose bilaterally. Neurological: She is alert and oriented to person, place, and time.  Dysphonia noted. Able to answer most orientation questions. Able to follow simple motor commands.    Blood pressure (!) 124/56, pulse (!) 58, temperature 98.4 F (36.9 C), temperature source Oral, resp. rate 19, height _0  (1.626 m), weight 65.2 kg, SpO2 98 %.   Results for orders placed or performed during the hospital encounter of 11/01/18 (from the past 48 hour(s))  Glucose, capillary     Status: Abnormal   Collection Time: 11/13/18  4:18 PM  Result Value Ref Range   Glucose-Capillary 194 (H) 70 - 99 mg/dL  Glucose, capillary     Status: Abnormal   Collection Time: 11/13/18  8:47 PM  Result Value Ref Range   Glucose-Capillary 219 (H) 70 - 99 mg/dL  Glucose, capillary     Status: None   Collection Time: 11/14/18 12:11 AM  Result Value Ref Range   Glucose-Capillary 83 70 - 99 mg/dL  CBC     Status: Abnormal   Collection Time: 11/14/18  2:33 AM  Result Value Ref Range   WBC 14.3 (H) 4.0 - 10.5 K/uL   RBC 4.20 3.87 - 5.11 MIL/uL   Hemoglobin 9.2 (L) 12.0 - 15.0 g/dL   HCT 29.3 (L) 36.0 - 46.0 %   MCV 69.8 (L) 80.0 - 100.0 fL   MCH 21.9 (L) 26.0 - 34.0  pg   MCHC 31.4 30.0 - 36.0 g/dL   RDW 15.3 11.5 - 15.5 %   Platelets 559 (H) 150 - 400 K/uL   nRBC 0.0 0.0 - 0.2 %    Comment: Performed at [facility], [place] [address] [place], [place] [address]  Hemoglobin A1c     Status: Abnormal   Collection Time: 11/14/18  2:33 AM  Result Value Ref Range   Hgb A1c MFr Bld 8.0 (H) 4.8 - 5.6 %    Comment: (NOTE) Pre diabetes:          5.7%-6.4% Diabetes:              >6.4% Glycemic control for   <7.0% adults with diabetes    Mean Plasma Glucose 182.9 mg/dL    Comment: Performed at [facility] [address], [place], [place] [address]  Magnesium     Status: None   Collection Time: 11/14/18  2:33 AM  Result Value Ref Range   Magnesium 2.2 1.7 - 2.4  mg/dL    Comment: Performed at [facility], [place] [address] [place], [place] [address]  Basic metabolic panel     Status: Abnormal   Collection Time: 11/14/18  2:33 AM  Result Value Ref Range   Sodium 138 135 - 145 mmol/L   Potassium 3.7 3.5 - 5.1 mmol/L   Chloride 103 98 - 111 mmol/L   CO2 25 22 - 32 mmol/L   Glucose, Bld 89 70 - 99 mg/dL   BUN 18 8 - 23 mg/dL   Creatinine, Ser 0.62 0.44 - 1.00 mg/dL   Calcium 8.6 (L) 8.9 - 10.3 mg/dL   GFR calc non Af Amer >60 >60 mL/min   GFR calc Af Amer >60 >60 mL/min   Anion gap 10 5 - 15    Comment: Performed at [facility] [address], [place], [place] [address]  Glucose, capillary     Status: None   Collection Time: 11/14/18  4:17 AM  Result Value Ref Range   Glucose-Capillary 95 70 - 99 mg/dL  Glucose, capillary     Status: None   Collection Time: 11/14/18  7:50 AM  Result Value Ref Range   Glucose-Capillary 86 70 - 99 mg/dL  Glucose, capillary     Status: Abnormal   Collection Time: 11/14/18 11:58 AM  Result Value Ref Range   Glucose-Capillary 165 (H) 70 - 99 mg/dL  Glucose, capillary     Status: Abnormal   Collection Time: 11/14/18  4:06 PM  Result Value Ref Range   Glucose-Capillary 200 (H) 70 - 99 mg/dL  Glucose, capillary     Status: Abnormal   Collection Time: 11/14/18  8:33 PM  Result Value Ref Range   Glucose-Capillary 153 (H) 70 - 99 mg/dL   Comment 1 Document in Chart   Glucose, capillary     Status: Abnormal   Collection Time: 11/14/18 10:42 PM  Result Value Ref Range   Glucose-Capillary 140 (H) 70 - 99 mg/dL   Comment 1 Document in Chart   CBC     Status: Abnormal   Collection Time: 11/15/18  2:21 AM  Result Value Ref Range   WBC 14.3 (H) 4.0 - 10.5 K/uL   RBC 3.95 3.87 - 5.11 MIL/uL   Hemoglobin 8.5 (L) 12.0 - 15.0 g/dL    Comment: Reticulocyte Hemoglobin testing may be clinically indicated, consider ordering this additional test [id number]    HCT 27.9 (L)  36.0 - 46.0 %   MCV 70.6 (L) 80.0  - 100.0 fL   MCH 21.5 (L) 26.0 - 34.0 pg   MCHC 30.5 30.0 - 36.0 g/dL   RDW 15.9 (H) 11.5 - 15.5 %   Platelets 634 (H) 150 - 400 K/uL   nRBC 0.0 0.0 - 0.2 %    Comment: Performed at [facility] [address] [place], [place] [address]  Magnesium     Status: None   Collection Time: 11/15/18  2:21 AM  Result Value Ref Range   Magnesium 2.1 1.7 - 2.4 mg/dL    Comment: Performed at [facility] [address] [place], [place] [address]  Basic metabolic panel     Status: Abnormal   Collection Time: 11/15/18  2:21 AM  Result Value Ref Range   Sodium 137 135 - 145 mmol/L   Potassium 4.1 3.5 - 5.1 mmol/L   Chloride 104 98 - 111 mmol/L   CO2 23 22 - 32 mmol/L   Glucose, Bld 128 (H) 70 - 99 mg/dL   BUN 15 8 - 23 mg/dL   Creatinine, Ser 0.72 0.44 - 1.00 mg/dL   Calcium 8.5 (L) 8.9 - 10.3 mg/dL   GFR calc non Af Amer >60 >60 mL/min   GFR calc Af Amer >60 >60 mL/min   Anion gap 10 5 - 15    Comment: Performed at [facility], [place] [address] [place], [place] [address]  Glucose, capillary     Status: Abnormal   Collection Time: 11/15/18  8:09 AM  Result Value Ref Range   Glucose-Capillary 107 (H) 70 - 99 mg/dL  Glucose, capillary     Status: Abnormal   Collection Time: 11/15/18 12:04 PM  Result Value Ref Range   Glucose-Capillary 133 (H) 70 - 99 mg/dL   No results found.     Medical Problem List and Plan: 1.  Impaired cognition and ADLs secondary to cerebellar infarction -3 hours of PT/OT/SLP at least 5 days per week 2.  Antithrombotics: -DVT/anticoagulation:  Pharmaceutical: Other (comment)--Eliquis  -antiplatelet therapy: N/A 3. Pain Management: Tylenol prn for HA/knee pain.  4. Mood: LCSW to follow for evaluation and support when appropriate.   -antipsychotic agents: N/A 5. Neuropsych: This patient is not fully capable of making decisions on her own behalf. 6. Skin/Wound Care: Routine pressure relief measures.  7. Fluids/Electrolytes/Nutrition: Monitor  I/O. Does not like dysphagia diet--family supplementing from home per reports.  Check lytes in am. 8. E coli bacteremia/pyelonephritis: Completed antibiotic course on 11/2 9. Pulmonary edema with ARF: 10 A fib with RVR: HR controlled on  11.  Asymptomatic bradycardia: Improving with decrease in metoprolol to 25 mg on 11/02. 12. HTN:  Monitor BP bid--continue Metoprolol.  13. T2DM: Hgb A1c-8.0.  Monitor BS ac/hs. Was on Janumet PTA. AKI has resolved--resume [place] today and metformin tomorrow if lytes sable and wean off Lantus..  14. Leucocytosis: WBC 15.4-->8.4-->14.3  trending up--monitor for signs of infection. Currently being treated with Keflex 518m for UTI.  15. Anemia: Folic [id number]. Will monitor H/H and watch for signs of bleeding. 11.0-->8.5.   16. Thrombocytosis: Likely reactive. Recheck in am.  17. Constipation: Bowel regimen with Docusate 1071mBID PRN and Senna 8.26m5mID [place]  KruLeeroy ChaD

## 2018-11-15 NOTE — PMR Pre-admission (Signed)
PMR Admission Coordinator Pre-Admission Assessment  Patient: Aimee Parker is an 68 y.o., female MRN: [id number] DOB: [birth year] Height: _0  (162.6 cm) Weight: 65.2 kg  Insurance Information HMO: yes    PPO:      PCP:      IPA:      80/20:      OTHER: medicare advantage plan PRIMARY: UHC Medicare      Policy#: [id number]      Subscriber: pt CM Name: Lurena Joiner     Phone#: [phone]     Fax#: [phone] Pre-Cert#: [id number] approved for 7 days      Employer:  Benefits:  Phone #: [phone]     Name: 11/2 Eff. Date: 02/12/2018     Deduct: none      Out of Pocket Max: [phone]      Life Max: none CIR: $225 co pay per admission/covered by Medicaid      SNF: no co pay days 1 until 20; $176 co pay per day days 21 until 100 Outpatient: no co pay      Co-Pay: visits per medical neccesity Home Health: 100%      Co-Pay: visits per medical neccesity DME: 80%     Co-Pay: 20% Providers: in network  SECONDARY: Medicaid of [place]      Policy#: [id number]      Subscriber: pt Passport one 11/2 active MAAQN  Medicaid Application Date:       Case Manager:  Disability Application Date:       Case Worker:   The "Data Collection Information Summary" for patients in Inpatient Rehabilitation Facilities with attached "Privacy Act [place] Records" was provided and verbally reviewed with: Patient and Family  Emergency Contact Information Contact Information    Name Relation Home Work Mobile   Ha,Glus Son [phone]     Panozzo, An [name]   [phone]   Hansel Starling Niece [phone]        Current Medical History  Patient Admitting Diagnosis: CVA  History of Present Illness: 68 year old female with past medical history significant for HTN and DM. History of fevers, dyspnea and fatigue. Temp 103.2 at urgent care and sent to ED on 11/01/2018. Patient was hypotensive, tachypnic with increased O2 requirement. Patient placed on BIPAP with PCCM consulted and admitted to ICU for septic shock due  to UTI and Pneumonia. Progressive dyspnea and eventually intubated on 11/02/2018.  Patient treated for acute respiratory failure due to pulmonary edema, sepsis with E. Coli bacteremia and pyelonephritis.   Hospital course complicated by cerebellar and diffuse cortical and subcortical infarcts noted on MRI brain on 11/06/2018. Neurology consulted and recommended starting on DAPT for 3 weeks followed by ASA alone. Dr. Erlinda Hong 11/1 recommended Eliquis and asa. Hgb A1c 8 started on statin.   Patient develop A. Fib with RVR on 10/30. Began IV amiodarone and Lovenox. She converted to sinus rhythm and transitioned to metoprolol for rate control and continued on Lovenox. Extubated on 10/28 and transitioned to 2 liters [place]. Continue to wean oxygen and albuterol prn.  Sepsis secondary to E. Coli bacteremia/pyelonephritis with mild hydronephrosis. Heart TTE and TEE negative for vegetation. ID recommended Ancef or Keflex for 10 days using 10/23 as Day 1. Transitioned to Keflex on 11/1 .  Afib with RVR now sinus rhythm and not symptomatic., Continue Metoprolol and Apixaban.  Patient's medical record from [facility]  has been reviewed by the rehabilitation admission coordinator and physician.  Past Medical History  Past Medical History:  Diagnosis  Date  . Diabetes mellitus without complication ([place])   . Hypertension   . Vitamin D deficiency     Family History   family history includes Hypertension in her father and mother.  Prior Rehab/Hospitalizations Has the patient had prior rehab or hospitalizations prior to admission? Yes  Has the patient had major surgery during 100 days prior to admission? No   Current Medications  Current Facility-Administered Medications:  .  0.9 %  sodium chloride infusion, 250 mL, Intravenous, Continuous, Mercy Riding, MD, Stopped at 11/06/18 0751 .  acetaminophen (TYLENOL) suppository 650 mg, 650 mg, Rectal, Q4H PRN, Cyndia Skeeters, Taye T, MD, 650 mg at 11/02/18 1759 .   acetaminophen (TYLENOL) tablet 650 mg, 650 mg, Oral, Q4H PRN, Kathie Dike, MD .  albuterol (PROVENTIL) (2.5 MG/3ML) 0.083% nebulizer solution 2.5 mg, 2.5 mg, Nebulization, Q3H PRN, Cyndia Skeeters, Taye T, MD, 2.5 mg at 11/10/18 1257 .  apixaban (ELIQUIS) tablet 5 mg, 5 mg, Oral, BID, Gonfa, Taye T, MD, 5 mg at 11/15/18 1027 .  aspirin chewable tablet 81 mg, 81 mg, Oral, Daily, Gonfa, Taye T, MD, 81 mg at 11/15/18 1026 .  atorvastatin (LIPITOR) tablet 40 mg, 40 mg, Oral, q1800, Wendee Beavers T, MD, 40 mg at 11/14/18 1613 .  bisacodyl (DULCOLAX) suppository 10 mg, 10 mg, Rectal, Daily PRN, Wendee Beavers T, MD, 10 mg at 11/07/18 1706 .  chlorhexidine gluconate (MEDLINE KIT) (PERIDEX) 0.12 % solution 15 mL, 15 mL, Mouth Rinse, BID, Gonfa, Taye T, MD, 15 mL at 11/14/18 2256 .  Chlorhexidine Gluconate Cloth 2 % PADS 6 each, 6 each, Topical, Daily, Wendee Beavers T, MD, 6 each at 11/14/18 1004 .  docusate (COLACE) 50 MG/5ML liquid 100 mg, 100 mg, Per Tube, BID PRN, Wendee Beavers T, MD, 100 mg at 11/07/18 1706 .  feeding supplement (ENSURE ENLIVE) (ENSURE ENLIVE) liquid 237 mL, 237 mL, Oral, TID BM, Gonfa, Taye T, MD, 237 mL at 11/15/18 1455 .  influenza vaccine adjuvanted (FLUAD) injection 0.5 mL, 0.5 mL, Intramuscular, Tomorrow-1000, Memon, Jehanzeb, MD .  insulin aspart (novoLOG) injection 0-5 Units, 0-5 Units, Subcutaneous, QHS, Memon, Jehanzeb, MD .  insulin aspart (novoLOG) injection 0-9 Units, 0-9 Units, Subcutaneous, TID WC, Kathie Dike, MD, 1 Units at 11/15/18 1241 .  insulin glargine (LANTUS) injection 20 Units, 20 Units, Subcutaneous, Q24H, Gonfa, Taye T, MD, 20 Units at 11/15/18 1200 .  metoprolol tartrate (LOPRESSOR) tablet 25 mg, 25 mg, Oral, BID, Omar Person, NP, 25 mg at 11/15/18 1027 .  ondansetron (ZOFRAN) injection 4 mg, 4 mg, Intravenous, Q6H PRN, Wendee Beavers T, MD, 4 mg at 11/10/18 2017 .  polyethylene glycol (MIRALAX / GLYCOLAX) packet 17 g, 17 g, Per Tube, Daily, Gonfa, Taye T, MD, 17 g  at 11/15/18 1026 .  Resource ThickenUp Clear, , Oral, PRN, Mercy Riding, MD .  Resource ThickenUp Clear, , Oral, PRN, Kathie Dike, MD .  senna (SENOKOT) tablet 8.6 mg, 1 tablet, Oral, BID, Memon, Jehanzeb, MD, 8.6 mg at 11/15/18 1026 .  sodium chloride flush (NS) 0.9 % injection 3 mL, 3 mL, Intravenous, Q12H, Gonfa, Taye T, MD, 3 mL at 11/15/18 1027  Patients Current Diet:  Diet Order            DIET - DYS 1 Room service appropriate? No; Fluid consistency: Nectar Thick  Diet effective now              Precautions / Restrictions Precautions Precautions: Fall Precaution Comments: R side instability standing Restrictions  Weight Bearing Restrictions: No   Has the patient had 2 or more falls or a fall with injury in the past year? No  Prior Activity Level    Prior Functional Level Self Care: Did the patient need help bathing, dressing, using the toilet or eating? Independent  Indoor Mobility: Did the patient need assistance with walking from room to room (with or without device)? Independent  Stairs: Did the patient need assistance with internal or external stairs (with or without device)? Independent  Functional Cognition: Did the patient need help planning regular tasks such as shopping or remembering to take medications? Independent  Home Assistive Devices / Equipment Home Assistive Devices/Equipment: None Home Equipment: None  Prior Device Use: Indicate devices/aids used by the patient prior to current illness, exacerbation or injury? None of the above  Current Functional Level Cognition  Arousal/Alertness: Awake/alert Overall Cognitive Status: Impaired/Different from baseline Orientation Level: Oriented to person, Oriented to place, Oriented to situation, Disoriented to time Following Commands: Follows one step commands inconsistently, Follows one step commands with increased time General Comments: slow to follow interpreted instructions but responded well to  questions Attention: Focused, Sustained, Selective Focused Attention: Appears intact Sustained Attention: Appears intact Selective Attention: Appears intact Memory: Appears intact Awareness: Appears intact    Extremity Assessment (includes Sensation/Coordination)  Upper Extremity Assessment: RUE deficits/detail, LUE deficits/detail RUE Deficits / Details: unable to full raise arm on her own, full PROM RUE Coordination: decreased fine motor, decreased gross motor LUE Deficits / Details: unable to full raise arm on her own, full PROM LUE Coordination: decreased gross motor, decreased fine motor  Lower Extremity Assessment: RLE deficits/detail, LLE deficits/detail, Difficult to assess due to impaired cognition RLE Deficits / Details: PROM full, difficulty with lifting from hip to move LE across bed RLE Sensation: WNL RLE Coordination: decreased fine motor, decreased gross motor LLE Deficits / Details: PROM full, difficulty with lifting from hip to move LE across bed LLE Sensation: WNL LLE Coordination: decreased fine motor, decreased gross motor    ADLs  Overall ADL's : Needs assistance/impaired Eating/Feeding: Total assistance, Bed level, Sitting Grooming: Total assistance, Bed level, Sitting Upper Body Bathing: Total assistance, Bed level, Sitting Lower Body Bathing: Total assistance, Bed level Upper Body Dressing : Total assistance, Bed level, Sitting Lower Body Dressing: Total assistance, Bed level Toilet Transfer: +2 for physical assistance, Total assistance, Squat-pivot Toilet Transfer Details (indicate cue type and reason): bed>recliner going to pt's right with use of bed pad and gait belt Toileting- Clothing Manipulation and Hygiene: Total assistance, Bed level    Mobility  Overal bed mobility: Needs Assistance Bed Mobility: Supine to Sit, Sit to Supine Supine to sit: Mod assist Sit to supine: Mod assist General bed mobility comments: better assist by pt today, mainly  assisting trunk and finishing scooting out to edge of bed, then lifting legs back to bed    Transfers  Overall transfer level: Needs assistance Equipment used: Rolling walker (2 wheeled), 2 person hand held assist Transfers: Sit to/from Stand Sit to Stand: Mod assist, +2 physical assistance, +2 safety/equipment, From elevated surface Squat pivot transfers: Total assist, +2 physical assistance General transfer comment: Use of bed pad and gait belt going to pt's left    Ambulation / Gait / Stairs / Wheelchair Mobility  Ambulation/Gait Ambulation/Gait assistance: Min assist, +2 physical assistance, +2 safety/equipment Gait Distance (Feet): 10 Feet(5 x 2) Assistive device: Rolling walker (2 wheeled), 2 person hand held assist Gait Pattern/deviations: Step-to pattern, Decreased stride length, Wide base  of support, Trunk flexed General Gait Details: weak to support on RLE but sidesteps on side of bed Gait velocity: reduced    Posture / Balance Dynamic Sitting Balance Sitting balance - Comments: leaning back with hip flexion exercises Balance Overall balance assessment: Needs assistance Sitting-balance support: Bilateral upper extremity supported, Feet supported Sitting balance-Leahy Scale: Fair Sitting balance - Comments: leaning back with hip flexion exercises Postural control: Posterior lean Standing balance support: Bilateral upper extremity supported, During functional activity Standing balance-Leahy Scale: Poor    Special needs/care consideration BiPAP/CPAP n/a CPM n/a Continuous Drip IV  N/a Dialysis n/a Life Vest  N/a Oxygen room air Special Bed  N/a Trach Size  N/a Wound Vac n/a Skin  intact Bowel mgmt:  Continent LBM 11/3 Bladder mgmt: external catheter Diabetic mgmt: Hgb A1c 8 Behavioral consideration  N/a Chemo/radiation  N/a [place] interpreter needed or use of [employer] Designated visitor is  Daughter [employer], does not speak ENglish   Previous Home  Environment  Living Arrangements: Spouse/significant other, Children  Lives With: Spouse, Family Available Help at Discharge: Family, Available 24 hours/day Type of Home: House Home Layout: One level Home Access: Stairs to enter Entrance Stairs-Rails: None Technical brewer of Steps: 2 Bathroom Shower/Tub: Multimedia programmer: Standard Bathroom Accessibility: Yes How Accessible: Accessible via walker Home Care Services: No  Discharge Living Setting Plans for Discharge Living Setting: Patient's home, Lives with (comment)(sposue and extended family) Type of Home at Discharge: House Discharge Home Layout: One level Discharge Home Access: Stairs to enter Entrance Stairs-Rails: None Entrance Stairs-Number of Steps: 2 Discharge Bathroom Shower/Tub: Horticulturist, commercial: Standard Discharge Bathroom Accessibility: Yes How Accessible: Accessible via walker Does the patient have any problems obtaining your medications?: No  Social/Family/Support Systems Patient Roles: Spouse, Parent Contact Information: son Glus and grandson, An speak fluent English Anticipated Caregiver: daughter Anticipated Ambulance person Information: see above Ability/Limitations of Caregiver: no limitations Caregiver Availability: 24/7 Discharge Plan Discussed with Primary Caregiver: Yes Is Caregiver In Agreement with Plan?: Yes Does Caregiver/Family have Issues with Lodging/Transportation while Pt is in Rehab?: No  Goals/Additional Needs Patient/Family Goal for Rehab: superivison PT, superivison to min OT, superivison SLP Expected length of stay: ELOS 10 to 14 days Cultural Considerations: vietnamese Special Service Needs: vietnamese interpreter onsite or [employer] equipment needed Pt/Family Agrees to Admission and willing to participate: Yes Program Orientation Provided & Reviewed with Pt/Caregiver Including Roles  & Responsibilities: Yes  Decrease burden of Care  through IP rehab admission: n/a  Possible need for SNF placement upon discharge:  Not anticipated  Patient Condition: I have reviewed medical records from [facility] , spoken with CM, and patient, daughter and family member. I met with patient at the bedside for inpatient rehabilitation assessment.  Patient will benefit from ongoing PT, OT and SLP, can actively participate in 3 hours of therapy a day 5 days of the week, and can make measurable gains during the admission.  Patient will also benefit from the coordinated team approach during an Inpatient Acute Rehabilitation admission.  The patient will receive intensive therapy as well as Rehabilitation physician, nursing, social worker, and care management interventions.  Due to bladder management, bowel management, safety, skin/wound care, disease management, medication administration, pain management and patient education the patient requires 24 hour a day rehabilitation nursing.  The patient is currently mod assist with mobility and basic ADLs.  Discharge setting and therapy post discharge at home with home health is anticipated.  Patient has agreed to  participate in the Acute Inpatient Rehabilitation Program and will admit today.  Preadmission Screen Completed By:  Cleatrice Burke, 11/15/2018 5:07 PM ______________________________________________________________________   Discussed status with Dr. Ranell Patrick on 11/15/2018 at  1708 and received approval for admission today.  Admission Coordinator:  Cleatrice Burke, RN, time 0721 Date  11/15/2018   Assessment/Plan: Diagnosis: 1. Does the need for close, 24 hr/day Medical supervision in concert with the patient's rehab needs make it unreasonable for this patient to be served in a less intensive setting? Yes 2. Co-Morbidities requiring supervision/potential complications: HTN, DM, pyelonephritis, septic shock on BiPAP 3. Due to safety, skin/wound care, disease management, medication  administration and patient education, does the patient require 24 hr/day rehab nursing? Yes 4. Does the patient require coordinated care of a physician, rehab nurse, PT, OT, and SLP to address physical and functional deficits in the context of the above medical diagnosis(es)? Yes Addressing deficits in the following areas: balance, endurance, locomotion, strength, transferring, bathing, dressing, feeding, grooming, toileting and psychosocial support 5. Can the patient actively participate in an intensive therapy program of at least 3 hrs of therapy 5 days a week? Yes 6. The potential for patient to make measurable gains while on inpatient rehab is good 7. Anticipated functional outcomes upon discharge from inpatient rehab: supervision and min assist PT, supervision and min assist OT, n/a SLP 8. Estimated rehab length of stay to reach the above functional goals is: 10-14 days 9. Anticipated discharge destination: Home 10. Overall Rehab/Functional Prognosis: good   MD Signature:

## 2018-11-15 NOTE — Progress Notes (Signed)
Patient to be transferred to CIR. Report given to [place]. CIR will call when ready for patient. Information to be passed on to night shift nurse.

## 2018-11-15 NOTE — Progress Notes (Signed)
Pt arrived to unit by bed to 4MW room 5. Bed is in locked, low position with side rails up, all green lights on bottom. Breathing is shallow and slightly tachypnic but unlabored. Pt has new armband and states no needs as this time. Contacted admissions to transfer pt file to this unit then will start admission assessment with interpreter.

## 2018-11-15 NOTE — Progress Notes (Signed)
Pt was transferred to CIR. Tele box removed.

## 2018-11-15 NOTE — Progress Notes (Addendum)
Inpatient Rehabilitation Admissions Coordinator  I have received approval to admit to CIR today. I have notified Dr. Roderic Palau to make d/c arrangements. I spoke with daughter by phone with her spouse interpreting on conference call. They are in agreement to admit.   Danne Baxter, RN, MSN Rehab Admissions Coordinator [phone] 11/15/2018 5:06 PM '

## 2018-11-15 NOTE — Progress Notes (Signed)
Courtney Heys, MD  Physician  Physical Medicine and Rehabilitation  PMR Pre-admission  Signed  Date of Service:  11/15/2018 3:37 PM      Related encounter: ED to Hosp-Admission (Current) from 11/01/2018 in [facility]      Signed         Show:Clear all _0 Manual_1 Template_2 Copied  Added by: _3 Cristina Gong, RN_4 Courtney Heys, MD  _5 Hover for details PMR Admission Coordinator Pre-Admission Assessment  Patient: Aimee Parker is an 68 y.o., female MRN: [id number] DOB: September 04, 1950 Height: _6  (162.6 cm) Weight: 65.2 kg  Insurance Information HMO: yes    PPO:      PCP:      IPA:      80/20:      OTHER: medicare advantage plan PRIMARY: UHC Medicare      Policy#: [id number]      Subscriber: pt CM Name: Lurena Joiner     Phone#: [phone]     Fax#: [phone] Pre-Cert#: [id number] approved for 7 days      Employer:  Benefits:  Phone #: [phone]     Name: 11/2 Eff. Date: 02/12/2018     Deduct: none      Out of Pocket Max: [phone]      Life Max: none CIR: $225 co pay per admission/covered by Medicaid      SNF: no co pay days 1 until 20; $176 co pay per day days 21 until 100 Outpatient: no co pay      Co-Pay: visits per medical neccesity Home Health: 100%      Co-Pay: visits per medical neccesity DME: 80%     Co-Pay: 20% Providers: in network  SECONDARY: Medicaid of [place]      Policy#: [id number]      Subscriber: pt Passport one 11/2 active MAAQN  Medicaid Application Date:       Case Manager:  Disability Application Date:       Case Worker:   The Data Collection Information Summary for patients in Inpatient Rehabilitation Facilities with attached Privacy Act [place] Records was provided and verbally reviewed with: Patient and Family  Emergency Contact Information         Contact Information    Name Relation Home Work Mobile   Ha,Glus Son [phone]     Broadnax, An [name]   [phone]   Hansel Starling Niece  [phone]        Current Medical History  Patient Admitting Diagnosis: CVA  History of Present Illness: 68 year old female with past medical history significant for HTN and DM. History of fevers, dyspnea and fatigue. Temp 103.2 at urgent care and sent to ED on 11/01/2018. Patient was hypotensive, tachypnic with increased O2 requirement. Patient placed on BIPAP with PCCM consulted and admitted to ICU for septic shock due to UTI and Pneumonia. Progressive dyspnea and eventually intubated on 11/02/2018.  Patient treated for acute respiratory failure due to pulmonary edema, sepsis with E. Coli bacteremia and pyelonephritis.   Hospital course complicated by cerebellar and diffuse cortical and subcortical infarcts noted on MRI brain on 11/06/2018. Neurology consulted and recommended starting on DAPT for 3 weeks followed by ASA alone. Dr. Erlinda Hong 11/1 recommended Eliquis and asa. Hgb A1c 8 started on statin.   Patient develop A. Fib with RVR on 10/30. Began IV amiodarone and Lovenox. She converted to sinus rhythm and transitioned to metoprolol for rate control and continued on Lovenox. Extubated on 10/28 and transitioned to 2 liters [place]. Continue to wean oxygen and albuterol  prn.  Sepsis secondary to E. Coli bacteremia/pyelonephritis with mild hydronephrosis. Heart TTE and TEE negative for vegetation. ID recommended Ancef or Keflex for 10 days using 10/23 as Day 1. Transitioned to Keflex on 11/1 .  Afib with RVR now sinus rhythm and not symptomatic., Continue Metoprolol and Apixaban.  Patient's medical record from [facility]  has been reviewed by the rehabilitation admission coordinator and physician.  Past Medical History  Past Medical History:  Diagnosis Date   Diabetes mellitus without complication ([place])    Hypertension    Vitamin D deficiency     Family History   family history includes Hypertension in her father and mother.  Prior Rehab/Hospitalizations Has  the patient had prior rehab or hospitalizations prior to admission? Yes  Has the patient had major surgery during 100 days prior to admission? No              Current Medications  Current Facility-Administered Medications:    0.9 %  sodium chloride infusion, 250 mL, Intravenous, Continuous, Cyndia Skeeters, Taye T, MD, Stopped at 11/06/18 0751   acetaminophen (TYLENOL) suppository 650 mg, 650 mg, Rectal, Q4H PRN, Wendee Beavers T, MD, 650 mg at 11/02/18 1759   acetaminophen (TYLENOL) tablet 650 mg, 650 mg, Oral, Q4H PRN, Kathie Dike, MD   albuterol (PROVENTIL) (2.5 MG/3ML) 0.083% nebulizer solution 2.5 mg, 2.5 mg, Nebulization, Q3H PRN, Cyndia Skeeters, Taye T, MD, 2.5 mg at 11/10/18 1257   apixaban (ELIQUIS) tablet 5 mg, 5 mg, Oral, BID, Cyndia Skeeters, Taye T, MD, 5 mg at 11/15/18 1027   aspirin chewable tablet 81 mg, 81 mg, Oral, Daily, Gonfa, Taye T, MD, 81 mg at 11/15/18 1026   atorvastatin (LIPITOR) tablet 40 mg, 40 mg, Oral, q1800, Cyndia Skeeters, Taye T, MD, 40 mg at 11/14/18 1613   bisacodyl (DULCOLAX) suppository 10 mg, 10 mg, Rectal, Daily PRN, Wendee Beavers T, MD, 10 mg at 11/07/18 1706   chlorhexidine gluconate (MEDLINE KIT) (PERIDEX) 0.12 % solution 15 mL, 15 mL, Mouth Rinse, BID, Gonfa, Taye T, MD, 15 mL at 11/14/18 2256   Chlorhexidine Gluconate Cloth 2 % PADS 6 each, 6 each, Topical, Daily, Cyndia Skeeters, Taye T, MD, 6 each at 11/14/18 1004   docusate (COLACE) 50 MG/5ML liquid 100 mg, 100 mg, Per Tube, BID PRN, Wendee Beavers T, MD, 100 mg at 11/07/18 1706   feeding supplement (ENSURE ENLIVE) (ENSURE ENLIVE) liquid 237 mL, 237 mL, Oral, TID BM, Gonfa, Taye T, MD, 237 mL at 11/15/18 1455   influenza vaccine adjuvanted (FLUAD) injection 0.5 mL, 0.5 mL, Intramuscular, Tomorrow-1000, Memon, Jehanzeb, MD   insulin aspart (novoLOG) injection 0-5 Units, 0-5 Units, Subcutaneous, QHS, Memon, Jehanzeb, MD   insulin aspart (novoLOG) injection 0-9 Units, 0-9 Units, Subcutaneous, TID WC, Memon, Jehanzeb, MD, 1 Units at  11/15/18 1241   insulin glargine (LANTUS) injection 20 Units, 20 Units, Subcutaneous, Q24H, Gonfa, Taye T, MD, 20 Units at 11/15/18 1200   metoprolol tartrate (LOPRESSOR) tablet 25 mg, 25 mg, Oral, BID, Eubanks, Katalina M, NP, 25 mg at 11/15/18 1027   ondansetron (ZOFRAN) injection 4 mg, 4 mg, Intravenous, Q6H PRN, Cyndia Skeeters, Taye T, MD, 4 mg at 11/10/18 2017   polyethylene glycol (MIRALAX / GLYCOLAX) packet 17 g, 17 g, Per Tube, Daily, Gonfa, Taye T, MD, 17 g at 11/15/18 1026   Resource ThickenUp Clear, , Oral, PRN, Cyndia Skeeters, Charlesetta Ivory, MD   Resource ThickenUp Clear, , Oral, PRN, Kathie Dike, MD   senna (SENOKOT) tablet 8.6 mg, 1 tablet, Oral, BID, Memon,  Jolaine Artist, MD, 8.6 mg at 11/15/18 1026   sodium chloride flush (NS) 0.9 % injection 3 mL, 3 mL, Intravenous, Q12H, Gonfa, Taye T, MD, 3 mL at 11/15/18 1027  Patients Current Diet:     Diet Order                  DIET - DYS 1 Room service appropriate? No; Fluid consistency: Nectar Thick  Diet effective now               Precautions / Restrictions Precautions Precautions: Fall Precaution Comments: R side instability standing Restrictions Weight Bearing Restrictions: No   Has the patient had 2 or more falls or a fall with injury in the past year? No  Prior Activity Level  Prior Functional Level Self Care: Did the patient need help bathing, dressing, using the toilet or eating? Independent  Indoor Mobility: Did the patient need assistance with walking from room to room (with or without device)? Independent  Stairs: Did the patient need assistance with internal or external stairs (with or without device)? Independent  Functional Cognition: Did the patient need help planning regular tasks such as shopping or remembering to take medications? Independent  Home Assistive Devices / Equipment Home Assistive Devices/Equipment: None Home Equipment: None  Prior Device Use: Indicate devices/aids used by the patient  prior to current illness, exacerbation or injury? None of the above  Current Functional Level Cognition  Arousal/Alertness: Awake/alert Overall Cognitive Status: Impaired/Different from baseline Orientation Level: Oriented to person, Oriented to place, Oriented to situation, Disoriented to time Following Commands: Follows one step commands inconsistently, Follows one step commands with increased time General Comments: slow to follow interpreted instructions but responded well to questions Attention: Focused, Sustained, Selective Focused Attention: Appears intact Sustained Attention: Appears intact Selective Attention: Appears intact Memory: Appears intact Awareness: Appears intact    Extremity Assessment (includes Sensation/Coordination)  Upper Extremity Assessment: RUE deficits/detail, LUE deficits/detail RUE Deficits / Details: unable to full raise arm on her own, full PROM RUE Coordination: decreased fine motor, decreased gross motor LUE Deficits / Details: unable to full raise arm on her own, full PROM LUE Coordination: decreased gross motor, decreased fine motor  Lower Extremity Assessment: RLE deficits/detail, LLE deficits/detail, Difficult to assess due to impaired cognition RLE Deficits / Details: PROM full, difficulty with lifting from hip to move LE across bed RLE Sensation: WNL RLE Coordination: decreased fine motor, decreased gross motor LLE Deficits / Details: PROM full, difficulty with lifting from hip to move LE across bed LLE Sensation: WNL LLE Coordination: decreased fine motor, decreased gross motor    ADLs  Overall ADL's : Needs assistance/impaired Eating/Feeding: Total assistance, Bed level, Sitting Grooming: Total assistance, Bed level, Sitting Upper Body Bathing: Total assistance, Bed level, Sitting Lower Body Bathing: Total assistance, Bed level Upper Body Dressing : Total assistance, Bed level, Sitting Lower Body Dressing: Total assistance, Bed  level Toilet Transfer: +2 for physical assistance, Total assistance, Squat-pivot Toilet Transfer Details (indicate cue type and reason): bed>recliner going to pt's right with use of bed pad and gait belt Toileting- Clothing Manipulation and Hygiene: Total assistance, Bed level    Mobility  Overal bed mobility: Needs Assistance Bed Mobility: Supine to Sit, Sit to Supine Supine to sit: Mod assist Sit to supine: Mod assist General bed mobility comments: better assist by pt today, mainly assisting trunk and finishing scooting out to edge of bed, then lifting legs back to bed    Transfers  Overall transfer level: Needs assistance  Equipment used: Rolling walker (2 wheeled), 2 person hand held assist Transfers: Sit to/from Stand Sit to Stand: Mod assist, +2 physical assistance, +2 safety/equipment, From elevated surface Squat pivot transfers: Total assist, +2 physical assistance General transfer comment: Use of bed pad and gait belt going to pt's left    Ambulation / Gait / Stairs / Wheelchair Mobility  Ambulation/Gait Ambulation/Gait assistance: Min assist, +2 physical assistance, +2 safety/equipment Gait Distance (Feet): 10 Feet(5 x 2) Assistive device: Rolling walker (2 wheeled), 2 person hand held assist Gait Pattern/deviations: Step-to pattern, Decreased stride length, Wide base of support, Trunk flexed General Gait Details: weak to support on RLE but sidesteps on side of bed Gait velocity: reduced    Posture / Balance Dynamic Sitting Balance Sitting balance - Comments: leaning back with hip flexion exercises Balance Overall balance assessment: Needs assistance Sitting-balance support: Bilateral upper extremity supported, Feet supported Sitting balance-Leahy Scale: Fair Sitting balance - Comments: leaning back with hip flexion exercises Postural control: Posterior lean Standing balance support: Bilateral upper extremity supported, During functional activity Standing  balance-Leahy Scale: Poor    Special needs/care consideration BiPAP/CPAP n/a CPM n/a Continuous Drip IV  N/a Dialysis n/a Life Vest  N/a Oxygen room air Special Bed  N/a Trach Size  N/a Wound Vac n/a Skin  intact Bowel mgmt:  Continent LBM 11/3 Bladder mgmt: external catheter Diabetic mgmt: Hgb A1c 8 Behavioral consideration  N/a Chemo/radiation  N/a [place] interpreter needed or use of [employer] Designated visitor is  Daughter [employer], does not speak ENglish   Previous Home Environment  Living Arrangements: Spouse/significant other, Children  Lives With: Spouse, Family Available Help at Discharge: Family, Available 24 hours/day Type of Home: House Home Layout: One level Home Access: Stairs to enter Entrance Stairs-Rails: None Technical brewer of Steps: 2 Bathroom Shower/Tub: Multimedia programmer: Standard Bathroom Accessibility: Yes How Accessible: Accessible via walker Home Care Services: No  Discharge Living Setting Plans for Discharge Living Setting: Patient's home, Lives with (comment)(sposue and extended family) Type of Home at Discharge: House Discharge Home Layout: One level Discharge Home Access: Stairs to enter Entrance Stairs-Rails: None Entrance Stairs-Number of Steps: 2 Discharge Bathroom Shower/Tub: Horticulturist, commercial: Standard Discharge Bathroom Accessibility: Yes How Accessible: Accessible via walker Does the patient have any problems obtaining your medications?: No  Social/Family/Support Systems Patient Roles: Spouse, Parent Contact Information: son Glus and grandson, An speak fluent English Anticipated Caregiver: daughter Anticipated Ambulance person Information: see above Ability/Limitations of Caregiver: no limitations Caregiver Availability: 24/7 Discharge Plan Discussed with Primary Caregiver: Yes Is Caregiver In Agreement with Plan?: Yes Does Caregiver/Family have Issues with  Lodging/Transportation while Pt is in Rehab?: No  Goals/Additional Needs Patient/Family Goal for Rehab: superivison PT, superivison to min OT, superivison SLP Expected length of stay: [facility]OS 10 to 14 days Cultural Considerations: vietnamese Special Service Needs: vietnamese interpreter onsite or [employer] equipment needed Pt/Family Agrees to Admission and willing to participate: Yes Program Orientation Provided & Reviewed with Pt/Caregiver Including Roles  & Responsibilities: Yes  Decrease burden of Care through IP rehab admission: n/a  Possible need for SNF placement upon discharge:  Not anticipated  Patient Condition: I have reviewed medical [facility] from [facility] , spoken with CM, and patient, daughter and family member. I met with patient at the bedside for inpatient rehabilitation assessment.  Patient will benefit from ongoing PT, OT and SLP, can actively participate in 3 hours of therapy a day 5 days of the week, and can make  measurable gains during the admission.  Patient will also benefit from the coordinated team approach during an Inpatient Acute Rehabilitation admission.  The patient will receive intensive therapy as well as Rehabilitation physician, nursing, social worker, and care management interventions.  Due to bladder management, bowel management, safety, skin/wound care, disease management, medication administration, pain management and patient education the patient requires 24 hour a day rehabilitation nursing.  The patient is currently mod assist with mobility and basic ADLs.  Discharge setting and therapy post discharge at home with home health is anticipated.  Patient has agreed to participate in the Acute Inpatient Rehabilitation Program and will admit today.  Preadmission Screen Completed By:  Cleatrice Burke, 11/15/2018 5:07 PM ______________________________________________________________________   Discussed status with Dr. Ranell Patrick on 11/15/2018 at  1708  and received approval for admission today.  Admission Coordinator:  Cleatrice Burke, RN, time 9122 Date  11/15/2018   Assessment/Plan: Diagnosis: 1. Does the need for close, 24 hr/day Medical supervision in concert with the patient's rehab needs make it unreasonable for this patient to be served in a less intensive setting? Yes 2. Co-Morbidities requiring supervision/potential complications: HTN, DM, pyelonephritis, septic shock on BiPAP 3. Due to safety, skin/wound care, disease management, medication administration and patient education, does the patient require 24 hr/day rehab nursing? Yes 4. Does the patient require coordinated care of a physician, rehab nurse, PT, OT, and SLP to address physical and functional deficits in the context of the above medical diagnosis(es)? Yes Addressing deficits in the following areas: balance, endurance, locomotion, strength, transferring, bathing, dressing, feeding, grooming, toileting and psychosocial support 5. Can the patient actively participate in an intensive therapy program of at least 3 hrs of therapy 5 days a week? Yes 6. The potential for patient to make measurable gains while on inpatient rehab is good 7. Anticipated functional outcomes upon discharge from inpatient rehab: supervision and min assist PT, supervision and min assist OT, n/a SLP 8. Estimated rehab length of stay to reach the above functional goals is: 10-14 days 9. Anticipated discharge destination: Home 10. Overall Rehab/Functional Prognosis: good   MD Signature:         Revision History

## 2018-11-15 NOTE — Discharge Summary (Signed)
Physician Discharge Summary  Aimee Parker MGN:[id number] DOB: [birth year] DOA: 11/01/2018  PCP: Gildardo Pounds, NP  Admit date: 11/01/2018 Discharge date: 11/15/2018  Admitted From: Home Disposition: CIR  Recommendations for Outpatient Follow-up:  1. Patient is being discharged to inpatient rehab for further treatment 2. Continue to follow WBC count and clinical signs.  Currently does not have any signs of infection 3. Patient needs follow-up with urology in the next 2 weeks for left-sided hydronephrosis  Discharge Condition:stable CODE STATUS:full code Diet recommendation: Dysphagia 1 with nectar thick liquids  Brief/Interim Summary: 68 year old female with history of DM and HTN presented 11/01/2018 with fever, malaise, fatigue and abdominal pain.  She was admitted to internal medicine service for working diagnosis of sepsis due to UTI and possible pneumonia.  She had progressive dyspnea requiring BiPAP and transferred to ICU and intubated 11/02/2018.   She was treated for acute respiratory failure due to pulmonary edema, sepsis with E. coli bacteremia and pyelonephritis.   Hospital course complicated by cerebellar and diffuse cortical and subcortical infarcts as noted on MRI brain on 11/06/2018. Neurology consulted and recommended starting an DAPT for 3 weeks followed by aspirin alone.  However, patient had A. fib with RVR in 11/11/2018.  She was a started on IV amiodarone and Lovenox.  She converted to sinus rhythm and transition to metoprolol for rate control and continued on Lovenox.  She was extubated 10/28 to 6 L by [facility] and remained stable.  Transferred to [facility] Med service 11/1.  Evaluated by PT/OT who recommended CIR.  Discharge Diagnoses:  Principal Problem:   Sepsis ([place]) Active Problems:   Essential hypertension, benign   Acute respiratory failure (HCC)   Pneumonia   Pyelonephritis   Endotracheal tube present   Septic shock (HCC)   Cerebral embolism with cerebral  infarction   E coli bacteremia   Urinary tract infection without hematuria   DM (diabetes mellitus), type 2 (HCC)  Acute respiratory failure with hypoxia-multifactorial including pulmonary edema/possible Pneumonia, A. fib with RVR and sepsis due to E. coli bacteremia.  ETT 10/21-10/28.  She has since been weaned off of oxygen and is currently breathing comfortably on room air. -Continue as needed albuterol  Sepsis secondary to E. coli bacteremia/pyelonephritis with mild left hydronephrosis: Sepsis physiology resolved. Heart TTE and TEE negative for vegetation.  -ID recommended Ancef or Keflex for 10 days using 10/23 as day 1.  -She has completed her course of antibiotics in the hospital -she does have a residual leukocytosis, but has no fever or other signs of infection. Will continue to monitor for now  Pyelonephritis with left hydronephrosis. -Patient was treated with appropriate antibiotics and has clinically improved -She did have a CT scan of the abdomen pelvis that did not reveal any obstructing stone -Films reviewed with Dr. Tresa Moore on-call for urology.  Hydronephrosis felt to be secondary to pyelonephritis. -No further intervention recommended at this time.  Follow-up with urology in 2 to 3 weeks.  A. fib with RVR: Now sinus rhythm with stable heart rate -Continue metoprolol and apixiban  Acute left cerebellar embolic stroke/possible callosal body, splenium, left cingulate gyrus and left globus pallidus ischemic infarct. TEE negative for PFO. Initially, neurology recommended DAPT for 3 weeks followed by aspirin when they signed off on 10/28.  However, patient had A. fib with RVR and was started on full dose anticoagulation -Discussed with neurology , Dr. Erlinda Hong 11/1 with recommendations for Eliquis and aspirin  Essential hypertension: SBP in 130s and 140s. -On metoprolol as  above. -She was taking lisinopril and amlodipine which have been held during her hospital course -Please can  be resumed as blood pressures tolerate, currently she is normotensive  Normocytic anemia: Hgb 9.2-improved. Stable.  Continue to follow  Generalized weakness -PT/OT recommended CIR  NIDDM-2 CBG (last 3)  Recent Labs (last 2 labs)        Recent Labs    11/14/18 0417 11/14/18 0750 11/14/18 1158  GLUCAP 95 86 165*    -She was taking glimepiride,  Sitagliptin/Metformin.  These were held on admission she was started on Lantus and sliding scale.  Blood sugars have been stable. -A1c 8 -Started on statin.   Discharge Instructions  Discharge Instructions    Diet - low sodium heart healthy   Complete by: As directed    Increase activity slowly   Complete by: As directed      Allergies as of 11/15/2018   No Known Allergies     Medication List    STOP taking these medications   amLODipine 10 MG tablet Commonly known as: NORVASC   glimepiride 2 MG tablet Commonly known as: AMARYL   hydrochlorothiazide 25 MG tablet Commonly known as: HYDRODIURIL   Janumet 50-1000 MG tablet Generic drug: sitaGLIPtin-metformin   lisinopril 40 MG tablet Commonly known as: ZESTRIL     TAKE these medications   Accu-Chek Aviva Plus w/Device Kit 1 each by Does not apply route 3 (three) times daily. METER STOPPED WORKING   Accu-Chek Aviva test strip Generic drug: glucose blood Use as instructed. Check blood glucose levels twice per day by fingerstick   glucose blood test strip Provide patient with insurance approved strips. Use as instructed. Inject into the skin once daily.   Accu-Chek Softclix Lancets lancets Use as instructed. Check blood glucose levels twice per day by fingerstick   albuterol (2.5 MG/3ML) 0.083% nebulizer solution Commonly known as: PROVENTIL Take 3 mLs (2.5 mg total) by nebulization every 3 (three) hours as needed for wheezing or shortness of breath.   apixaban 5 MG Tabs tablet Commonly known as: ELIQUIS Take 1 tablet (5 mg total) by mouth 2 (two) times  daily.   aspirin 81 MG chewable tablet Chew 1 tablet (81 mg total) by mouth daily. Start taking on: November 16, 2018   atorvastatin 40 MG tablet Commonly known as: LIPITOR Take 1 tablet (40 mg total) by mouth daily.   insulin aspart 100 UNIT/ML injection Commonly known as: novoLOG Inject 0-5 Units into the skin at bedtime.   insulin aspart 100 UNIT/ML injection Commonly known as: novoLOG Inject 0-9 Units into the skin 3 (three) times daily with meals.   insulin glargine 100 UNIT/ML injection Commonly known as: LANTUS Inject 0.2 mLs (20 Units total) into the skin daily. Start taking on: November 16, 2018   metoprolol tartrate 25 MG tablet Commonly known as: LOPRESSOR Take 1 tablet (25 mg total) by mouth 2 (two) times daily.   Vitamin D (Ergocalciferol) 1.25 MG (50000 UT) Caps capsule Commonly known as: DRISDOL Take 1 capsule (50,000 Units total) by mouth every 7 (seven) days.       No Known Allergies  Consultations:  Critical care  Neurology  Infectious disease  Physical medicine and rehabilitation  Cardiology for TEE   Procedures/Studies: Ct Abdomen Pelvis Wo Contrast  Result Date: 11/02/2018 CLINICAL DATA:  Sepsis with unclear source of infection. Possible pyelonephritis. EXAM: CT ABDOMEN AND PELVIS WITHOUT CONTRAST TECHNIQUE: Multidetector CT imaging of the abdomen and pelvis was performed following the standard protocol  without IV contrast. COMPARISON:  Included portion from chest CTA yesterday. FINDINGS: Lower chest: Bibasilar consolidations, assessed on chest CT yesterday. Slight improvement in right middle lobe atelectasis from prior. Trace pleural thickening. Hepatobiliary: Decreased hepatic density consistent with steatosis. No obvious focal abnormality on noncontrast exam. Gallbladder physiologically distended, no calcified stone. No biliary dilatation. Pancreas: No ductal dilatation or inflammation. Spleen: Small in size. No focal abnormality.  Adrenals/Urinary Tract: Left adrenal thickening. No nodule. Normal right adrenal gland. Excreted IV contrast from prior chest CT within both renal collecting systems. Patchy left renal nephrogram with heterogeneous striated appearance. Moderate left perinephric edema. No obvious focal renal fluid collection. Mild left ureteral prominence. Symmetric hyperdensity in the ureters at the level of pelvic brim likely residual contrast, cannot exclude stone, however symmetric appearance makes this less likely. Minimal right perinephric edema without striated nephrogram. Excreted IV contrast in the urinary bladder. Stomach/Bowel: Nondistended stomach. Few prominent fluid-filled small bowel loops in the left abdomen likely reactive ileus. No evidence of obstruction. Normal appendix. Small volume of colonic stool. Wall thickening of the splenic flexure of the colon, may simply be reactive secondary to adjacent left renal inflammation. Vascular/Lymphatic: Aortic atherosclerosis. No aneurysm. Multiple left retroperitoneal nodes are likely reactive. Reproductive: Uterus and bilateral adnexa are unremarkable. Other: No significant free air free fluid. No evidence of intra-abdominal abscess. Musculoskeletal: There are no acute or suspicious osseous abnormalities. IMPRESSION: 1. Heterogeneous left renal nephrogram with heterogeneous striated appearance, consistent with pyelonephritis. Moderate left perinephric edema and mild left ureteral prominence. Possibility of left renal infarct is difficult to exclude, however in the presence of "dirty UA", pyelonephritis is favored. 2. Wall thickening of the splenic flexure of the colon may simply be reactive secondary to adjacent left renal inflammation. Colitis also considered. 3. Hepatic steatosis. 4. Lung base opacities which were assessed fully on chest CT yesterday. Aortic Atherosclerosis (ICD10-I70.0). Electronically Signed   By: Keith Rake M.D.   On: 11/02/2018 01:30   Ct  Angio Head W Or Wo Contrast  Result Date: 11/08/2018 CLINICAL DATA:  Stroke follow-up EXAM: CT ANGIOGRAPHY HEAD AND NECK TECHNIQUE: Multidetector CT imaging of the head and neck was performed using the standard protocol during bolus administration of intravenous contrast. Multiplanar CT image reconstructions and MIPs were obtained to evaluate the vascular anatomy. Carotid stenosis measurements (when applicable) are obtained utilizing NASCET criteria, using the distal internal carotid diameter as the denominator. CONTRAST:  44m OMNIPAQUE IOHEXOL 350 MG/ML SOLN COMPARISON:  Brain MRI 11/06/2018 FINDINGS: CT HEAD FINDINGS Brain: There is no mass, hemorrhage or extra-axial collection. The size and configuration of the ventricles and extra-axial CSF spaces are normal. Hypoattenuation left cerebellum at the site of known infarct. Bilateral cingulate gyri infarcts are also noted. Skull: The visualized skull base, calvarium and extracranial soft tissues are normal. Sinuses/Orbits: Right mastoid effusion. The orbits are normal. CTA NECK FINDINGS SKELETON: There is no bony spinal canal stenosis. No lytic or blastic lesion. OTHER NECK: Normal pharynx, larynx and major salivary glands. No cervical lymphadenopathy. Unremarkable thyroid gland. UPPER CHEST: No pneumothorax or pleural effusion. No nodules or masses. AORTIC ARCH: There is mild calcific atherosclerosis of the aortic arch. There is no aneurysm, dissection or hemodynamically significant stenosis of the visualized portion of the aorta. Conventional 3 vessel aortic branching pattern. The visualized proximal subclavian arteries are widely patent. RIGHT CAROTID SYSTEM: No dissection, occlusion or aneurysm. Mild atherosclerotic calcification at the carotid bifurcation without hemodynamically significant stenosis. LEFT CAROTID SYSTEM: No dissection, occlusion or aneurysm. Mild atherosclerotic  calcification at the carotid bifurcation without hemodynamically significant  stenosis. VERTEBRAL ARTERIES: Left dominant configuration. Both origins are clearly patent. There is no dissection, occlusion or flow-limiting stenosis to the skull base (V1-V3 segments). CTA HEAD FINDINGS POSTERIOR CIRCULATION: --Vertebral arteries: There is atherosclerotic calcification of the left V4 segment causing short segment severe stenosis. --Posterior inferior cerebellar arteries (PICA): Patent origins from the vertebral arteries. --Anterior inferior cerebellar arteries (AICA): Patent origins from the basilar artery. --Basilar artery: There is multifocal atherosclerotic irregularity of the basilar artery --Superior cerebellar arteries: Patent proximally. --Posterior cerebral arteries: There is a short segment occlusion of the left posterior cerebral artery P2 segment. The distal PCA is patent. There is a partial origin of the right PCA from the P-comm. ANTERIOR CIRCULATION: --Intracranial internal carotid arteries: Atherosclerotic calcification of the internal carotid arteries at the skull base without hemodynamically significant stenosis. --Anterior cerebral arteries (ACA): The right A1 segment is diminutive or absent. The left A1 segment is patent. The left A2 segment is occluded proximally. Right A2 segment is patent. --Middle cerebral arteries (MCA): Normal. VENOUS SINUSES: As permitted by contrast timing, patent. ANATOMIC VARIANTS: None Review of the MIP images confirms the above findings. IMPRESSION: 1. 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 2. Short segment occlusion of the proximal left posterior cerebral artery P2 segment. 3. Short segment severe stenosis of the left vertebral artery V4 segment. 4. No hemodynamically significant stenosis in the neck. 5. Small left cerebellar infarct. 6. Aortic Atherosclerosis (ICD10-I70.0). Electronically Signed   By: Ulyses Jarred M.D.   On: 11/08/2018 00:34   Dg Abd 1  View  Result Date: 11/11/2018 CLINICAL DATA:  Constipation EXAM: ABDOMEN - 1 VIEW COMPARISON:  11/04/2018 FINDINGS: There is a moderate amount of stool in the ascending colon. There is interval removal of the nasogastric tube. There is severe gaseous distension of the colon and to lesser extent the small bowel. There is no bowel dilatation to suggest obstruction. There is no evidence of pneumoperitoneum, portal venous gas or pneumatosis. There are no pathologic calcifications along the expected course of the ureters. The osseous structures are unremarkable. IMPRESSION: 1. Moderate amount of stool in the ascending colon. Interval removal of the nasogastric tube. 2. Severe gaseous distension of the colon and to lesser extent the small bowel which may reflect an ileus. Electronically Signed   By: Kathreen Devoid   On: 11/11/2018 10:25   Dg Abd 1 View  Result Date: 11/04/2018 CLINICAL DATA:  Abdominal distention. EXAM: ABDOMEN - 1 VIEW COMPARISON:  CT abdomen pelvis dated November 02, 2018. FINDINGS: Enteric tube in the distal stomach. The bowel gas pattern is normal. No radio-opaque calculi or other significant radiographic abnormality are seen. No acute osseous abnormality. IMPRESSION: 1. No acute findings. 2. Enteric tube in the stomach. Electronically Signed   By: Titus Dubin M.D.   On: 11/04/2018 09:06   Ct Head Wo Contrast  Result Date: 11/06/2018 CLINICAL DATA:  Encephalopathy EXAM: CT HEAD WITHOUT CONTRAST TECHNIQUE: Contiguous axial images were obtained from the base of the skull through the vertex without intravenous contrast. COMPARISON:  None. FINDINGS: Brain: Ventricle size normal.  No acute hemorrhage. Extensive hypodensity throughout the body and splenium of the corpus callosum. Anterior corpus callosum spared. No associated acute hemorrhage. 10 x 15 mm hypodensity left posterior cerebellum could be acute or chronic. No priors. Vascular: Negative for hyperdense vessel Skull: Negative  Sinuses/Orbits: Mucosal edema paranasal sinuses. Negative orbital lesion. Bilateral cataract  surgery. Other: None IMPRESSION: 10 x 15 mm hypodensity left posterior cerebellum. This could represent infarct of either acute or chronic duration. Mass lesion considered less likely. Extensive low density body and splenium of corpus callosum. Differential includes necrosis related to alcohol abuse or B vitamin deficiency. This is more extensive than chronic ischemia. No definite tumor. Differential includes toxic metabolic encephalopathy and osmotic demyelinization. Recommend MRI brain without and with contrast for further evaluation. These results were called by telephone at the time of interpretation on 11/06/2018 at 12:14 pm to provider [facility] , who verbally acknowledged these results. Electronically Signed   By: Franchot Gallo M.D.   On: 11/06/2018 12:14   Ct Angio Neck W Or Wo Contrast  Result Date: 11/08/2018 CLINICAL DATA:  Stroke follow-up EXAM: CT ANGIOGRAPHY HEAD AND NECK TECHNIQUE: Multidetector CT imaging of the head and neck was performed using the standard protocol during bolus administration of intravenous contrast. Multiplanar CT image reconstructions and MIPs were obtained to evaluate the vascular anatomy. Carotid stenosis measurements (when applicable) are obtained utilizing NASCET criteria, using the distal internal carotid diameter as the denominator. CONTRAST:  47m OMNIPAQUE IOHEXOL 350 MG/ML SOLN COMPARISON:  Brain MRI 11/06/2018 FINDINGS: CT HEAD FINDINGS Brain: There is no mass, hemorrhage or extra-axial collection. The size and configuration of the ventricles and extra-axial CSF spaces are normal. Hypoattenuation left cerebellum at the site of known infarct. Bilateral cingulate gyri infarcts are also noted. Skull: The visualized skull base, calvarium and extracranial soft tissues are normal. Sinuses/Orbits: Right mastoid effusion. The orbits are normal. CTA NECK FINDINGS SKELETON:  There is no bony spinal canal stenosis. No lytic or blastic lesion. OTHER NECK: Normal pharynx, larynx and major salivary glands. No cervical lymphadenopathy. Unremarkable thyroid gland. UPPER CHEST: No pneumothorax or pleural effusion. No nodules or masses. AORTIC ARCH: There is mild calcific atherosclerosis of the aortic arch. There is no aneurysm, dissection or hemodynamically significant stenosis of the visualized portion of the aorta. Conventional 3 vessel aortic branching pattern. The visualized proximal subclavian arteries are widely patent. RIGHT CAROTID SYSTEM: No dissection, occlusion or aneurysm. Mild atherosclerotic calcification at the carotid bifurcation without hemodynamically significant stenosis. LEFT CAROTID SYSTEM: No dissection, occlusion or aneurysm. Mild atherosclerotic calcification at the carotid bifurcation without hemodynamically significant stenosis. VERTEBRAL ARTERIES: Left dominant configuration. Both origins are clearly patent. There is no dissection, occlusion or flow-limiting stenosis to the skull base (V1-V3 segments). CTA HEAD FINDINGS POSTERIOR CIRCULATION: --Vertebral arteries: There is atherosclerotic calcification of the left V4 segment causing short segment severe stenosis. --Posterior inferior cerebellar arteries (PICA): Patent origins from the vertebral arteries. --Anterior inferior cerebellar arteries (AICA): Patent origins from the basilar artery. --Basilar artery: There is multifocal atherosclerotic irregularity of the basilar artery --Superior cerebellar arteries: Patent proximally. --Posterior cerebral arteries: There is a short segment occlusion of the left posterior cerebral artery P2 segment. The distal PCA is patent. There is a partial origin of the right PCA from the P-comm. ANTERIOR CIRCULATION: --Intracranial internal carotid arteries: Atherosclerotic calcification of the internal carotid arteries at the skull base without hemodynamically significant stenosis.  --Anterior cerebral arteries (ACA): The right A1 segment is diminutive or absent. The left A1 segment is patent. The left A2 segment is occluded proximally. Right A2 segment is patent. --Middle cerebral arteries (MCA): Normal. VENOUS SINUSES: As permitted by contrast timing, patent. ANATOMIC VARIANTS: None Review of the MIP images confirms the above findings. IMPRESSION: 1. 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  2. Short segment occlusion of the proximal left posterior cerebral artery P2 segment. 3. Short segment severe stenosis of the left vertebral artery V4 segment. 4. No hemodynamically significant stenosis in the neck. 5. Small left cerebellar infarct. 6. Aortic Atherosclerosis (ICD10-I70.0). Electronically Signed   By: Ulyses Jarred M.D.   On: 11/08/2018 00:34   Ct Angio Chest Pe W And/or Wo Contrast  Result Date: 11/01/2018 CLINICAL DATA:  Chest pain, fever nausea EXAM: CT ANGIOGRAPHY CHEST WITH CONTRAST TECHNIQUE: Multidetector CT imaging of the chest was performed using the standard protocol during bolus administration of intravenous contrast. Multiplanar CT image reconstructions and MIPs were obtained to evaluate the vascular anatomy. CONTRAST:  53m OMNIPAQUE IOHEXOL 350 MG/ML SOLN COMPARISON:  None. FINDINGS: Cardiovascular: There is a optimal opacification of the pulmonary arteries. There is no central,segmental, or subsegmental filling defects within the pulmonary arteries. There is mild cardiomegaly. Coronary artery calcifications are seen. Aortic valve calcifications are noted. There is normal three-vessel brachiocephalic anatomy without proximal stenosis. Scattered mild atherosclerosis is seen. Mediastinum/Nodes: There are small right hilar and paratracheal lymph nodes seen. Thyroid gland, trachea, and esophagus demonstrate no significant findings. Lungs/Pleura: Patchy airspace areas of consolidation are seen in  the bilateral lower lungs. There is ground-glass opacity seen within the upper lung. There is mild air bronchogram seen bilaterally at the posterior lungs. Upper Abdomen: No acute abnormalities present in the visualized portions of the upper abdomen. Musculoskeletal: No chest wall abnormality. No acute or significant osseous findings. Review of the MIP images confirms the above findings. IMPRESSION: 1. Patchy airspace areas of consolidation in the posterior lower lungs with ground-glass opacities in the upper lungs. There are a spectrum of findings in the lungs which can be seen with acute atypical infection (as well as other non-infectious etiologies). In particular, viral pneumonia should be considered in the appropriate clinical setting. 2. No central, segmental, or subsegmental pulmonary embolism. 3. Scattered mediastinal and right hilar lymph nodes. 4. Coronary artery calcifications 5.  Aortic Atherosclerosis (ICD10-I70.0). Electronically Signed   By: BPrudencio PairM.D.   On: 11/01/2018 21:20   Mr BJeri CosWWPContrast  Result Date: 11/06/2018 CLINICAL DATA:  Abnormal CT head with lesion of corpus callosum EXAM: MRI HEAD WITHOUT AND WITH CONTRAST TECHNIQUE: Multiplanar, multiecho pulse sequences of the brain and surrounding structures were obtained without and with intravenous contrast. CONTRAST:  779mGADAVIST GADOBUTROL 1 MMOL/ML IV SOLN COMPARISON:  Noncontrast head CT 11/06/2018 FINDINGS: Brain: There is extensive restricted diffusion with corresponding swelling and T2/FLAIR hyperintensity within the callosal body eccentric to the left, also extending posteriorly to involve the midline callosal splenium. There is also involvement of the left cingulate gyrus. No corresponding abnormal enhancement. Additional punctate focus of restricted diffuse and T2/FLAIR hyperintensity within the left globus pallidus (series 5, image 78). Acute infarct within the left cerebellum measuring 2.0 x 1.4 cm in transaxial  dimensions. Corresponding T2/FLAIR hyperintensity at this site. No significant white matter disease for age. Additional small T2 hyperintense foci within the bilateral basal ganglia are favored to reflect prominent perivascular spaces. Cerebral volume is normal for age. Partially empty sella turcica. No abnormal intracranial enhancement elsewhere. Vascular: Flow voids maintained within the proximal large arterial vessels. Expected enhancement within the dural venous sinuses. Skull and upper cervical spine: No focal marrow lesion. C3-C4 posterior disc osteophyte contributing to mild spinal canal stenosis. Sinuses/Orbits: Visualized orbits demonstrate no acute abnormality. Mild scattered paranasal sinus mucosal thickening. Bilateral mastoid effusions, large on the right. Fluid signal is  also present within the right eustachian tube. These results were called by telephone at the time of interpretation on 11/06/2018 at 4:37 pm to provider Sherrilyn Rist , who verbally acknowledged these results. IMPRESSION: 1. Restricted diffusion, swelling and edema within the callosal body and splenium as well as left cingulate gyrus. Additional punctate focus of restricted diffusion and edema within the left globus pallidus. Findings may reflect acute ischemic infarcts. However, toxic/metabolic etiologies should also be considered. 2. Acute left cerebellar infarct. 3. Bilateral mastoid effusions, large on the right. Fluid signal also present within the right eustachian tube. Electronically Signed   By: Kellie Simmering DO   On: 11/06/2018 16:38   US Renal  Result Date: 11/04/2018 CLINICAL DATA:  Urinary tract infection. EXAM: RENAL / URINARY TRACT ULTRASOUND COMPLETE COMPARISON:  None. FINDINGS: Right Kidney: Renal measurements: 10.4 x 5.8 x 5.3 cm = volume: 166 mL . Echogenicity within normal limits. No mass or hydronephrosis visualized. Left Kidney: Renal measurements: 11.4 x 6.6 x 5 cm = volume: 233 mL. Echogenicity within  normal limits. No mass visualized. Mild left hydronephrosis is noted. Bladder: Not visualized. Other: Mild ascites is noted. IMPRESSION: Mild left hydronephrosis is noted. CT urogram is recommended for further evaluation. Mild ascites. Electronically Signed   By: Marijo Conception M.D.   On: 11/04/2018 09:28   Dg Chest Port 1 View  Result Date: 11/04/2018 CLINICAL DATA:  Follow-up endotracheal tube EXAM: PORTABLE CHEST 1 VIEW COMPARISON:  11/03/2018 FINDINGS: Endotracheal tube, gastric catheter and left jugular central line are again seen and stable. Cardiac shadow is enlarged but stable. Aortic calcifications are seen. Improving vascular congestion is noted. Patchy bilateral infiltrates are seen with slight increase in the right base. No pneumothorax is seen. IMPRESSION: Patchy bilateral infiltrates. This is slightly increased in the right base when compare with the previous day. Electronically Signed   By: Inez Catalina M.D.   On: 11/04/2018 07:07   Dg Chest Port 1 View  Result Date: 11/03/2018 CLINICAL DATA:  Acute respiratory failure EXAM: PORTABLE CHEST 1 VIEW COMPARISON:  Yesterday FINDINGS: The endotracheal tube tip is 16 mm above the carina. Left IJ line with tip at the upper cavoatrial junction. The orogastric tube tip at least reaches the distal stomach. Diffuse interstitial and airspace opacity with cardiomegaly. There is asymmetric dense opacity behind the heart. No visible effusion or pneumothorax. Opacity may have mildly increased on the left. IMPRESSION: 1. Essentially stable hardware positioning. The endotracheal tube tip is 16 mm above the carina. 2. History of pneumonia with slight worsening of pulmonary opacity that is greater on the left. Electronically Signed   By: Monte Fantasia M.D.   On: 11/03/2018 05:46   Dg Chest Port 1 View  Result Date: 11/02/2018 CLINICAL DATA:  Central line placement EXAM: PORTABLE CHEST 1 VIEW COMPARISON:  11/02/2018, 11/01/2018 FINDINGS: Repositioning of  endotracheal tube, the tip is about 11 mm superior to the carina. Esophageal tube tip is below the diaphragm but is non included. New left IJ central venous catheter tip over the distal SVC. No pneumothorax. Cardiomegaly with vascular congestion. Similar appearance of interstitial and ground-glass opacity. Partial consolidation left lung base. IMPRESSION: 1. Repositioning of endotracheal tube with tip about 11 mm superior to carina 2. New left IJ central venous catheter with tip over the distal SVC. No left pneumothorax. 3. Similar appearance of cardiomegaly, vascular congestion, and interstitial and ground-glass opacities. No change in partial consolidation at the left base. Electronically Signed   By: Maudie Mercury  Francoise Ceo M.D.   On: 11/02/2018 22:52   Portable Chest X-ray  Result Date: 11/02/2018 CLINICAL DATA:  Endotracheal tube placement. EXAM: PORTABLE CHEST 1 VIEW COMPARISON:  Same day. FINDINGS: Stable cardiomegaly. No pneumothorax or significant pleural effusion is noted. Endotracheal tube is seen directed into right mainstem bronchus; withdrawal by 3-4 cm is recommended. Nasogastric tube is seen entering stomach. Mild bibasilar atelectasis or infiltrates are noted. Bony thorax is unremarkable. IMPRESSION: Endotracheal tube is seen directed into right mainstem bronchus; withdrawal by 3-4 cm is recommended. These results will be called to the ordering clinician or representative by the Radiologist Assistant, and communication documented in the PACS or zVision Dashboard. Nasogastric tube is seen entering stomach. Mild bibasilar atelectasis or infiltrates are noted. Electronically Signed   By: Marijo Conception M.D.   On: 11/02/2018 09:28   Dg Chest Port 1 View  Result Date: 11/02/2018 CLINICAL DATA:  Pneumonia EXAM: PORTABLE CHEST 1 VIEW COMPARISON:  11/01/2018 FINDINGS: Cardiomegaly. Patchy airspace disease noted lungs bilaterally, left greater than right. No significant effusions. No acute bony abnormality.  IMPRESSION: Patchy airspace disease in the left mid and lower lung as well as the right lower lung compatible with multifocal pneumonia. Mild cardiomegaly. Electronically Signed   By: Rolm Baptise M.D.   On: 11/02/2018 08:42   Dg Chest Portable 1 View  Result Date: 11/01/2018 CLINICAL DATA:  Fever and hypoxemia EXAM: PORTABLE CHEST 1 VIEW COMPARISON:  05/24/2006 FINDINGS: Mild cardiomegaly. No consolidation or pleural effusion. Aortic atherosclerosis. No pneumothorax. IMPRESSION: Mild cardiomegaly.  No focal pulmonary opacity. Electronically Signed   By: Donavan Foil M.D.   On: 11/01/2018 15:46   Vas [place] Lower Extremity Venous (dvt)  Result Date: 11/08/2018  Lower Venous Study Indications: Swelling.  Comparison Study: No prior study. Performing Technologist: Maudry Mayhew MHA, RDMS, RVT, RDCS  Examination Guidelines: A complete evaluation includes B-mode imaging, spectral Doppler, color Doppler, and power Doppler as needed of all accessible portions of each vessel. Bilateral testing is considered an integral part of a complete examination. Limited examinations for reoccurring indications may be performed as noted.  +---------+---------------+---------+-----------+----------+--------------+ RIGHT    CompressibilityPhasicitySpontaneityPropertiesThrombus Aging +---------+---------------+---------+-----------+----------+--------------+ CFV      Full           No       Yes                                 +---------+---------------+---------+-----------+----------+--------------+ SFJ      Full                                                        +---------+---------------+---------+-----------+----------+--------------+ FV Prox  Full                                                        +---------+---------------+---------+-----------+----------+--------------+ FV Mid   Full                                                         +---------+---------------+---------+-----------+----------+--------------+  FV DistalFull                                                        +---------+---------------+---------+-----------+----------+--------------+ PFV      Full                                                        +---------+---------------+---------+-----------+----------+--------------+ POP      Full           No       Yes                                 +---------+---------------+---------+-----------+----------+--------------+ PTV      Full                                                        +---------+---------------+---------+-----------+----------+--------------+ PERO     Full                                                        +---------+---------------+---------+-----------+----------+--------------+  +---------+---------------+---------+-----------+----------+--------------+ LEFT     CompressibilityPhasicitySpontaneityPropertiesThrombus Aging +---------+---------------+---------+-----------+----------+--------------+ CFV      Full           No       Yes                                 +---------+---------------+---------+-----------+----------+--------------+ SFJ      Full                                                        +---------+---------------+---------+-----------+----------+--------------+ FV Prox  Full                                                        +---------+---------------+---------+-----------+----------+--------------+ FV Mid   Full                                                        +---------+---------------+---------+-----------+----------+--------------+ FV DistalFull                                                        +---------+---------------+---------+-----------+----------+--------------+  PFV      Full                                                         +---------+---------------+---------+-----------+----------+--------------+ POP      Full           No       Yes                                 +---------+---------------+---------+-----------+----------+--------------+ PTV      Full                                                        +---------+---------------+---------+-----------+----------+--------------+ PERO     Full                                                        +---------+---------------+---------+-----------+----------+--------------+  Summary: Right: There is no evidence of deep vein thrombosis in the lower extremity. No cystic structure found in the popliteal fossa. Left: There is no evidence of deep vein thrombosis in the lower extremity. No cystic structure found in the popliteal fossa.  Bilateral lower extremity venous flow is pulsatile, suggestive of possible elevated right heart pressure. *See table(s) above for measurements and observations. Electronically signed by Deitra Mayo MD on 11/08/2018 at 4:56:45 PM.    Final    [place] Ekg Site Rite  Result Date: 11/02/2018 If Site Rite image not attached, placement could not be confirmed due to current cardiac rhythm.     Subjective: Patient says she has occasional shortness of breath.  No nausea or vomiting.  Feels generally weak.  Discharge Exam: Vitals:   11/15/18 0217 11/15/18 0500 11/15/18 0510 11/15/18 1635  BP:   (!) 124/56 136/69  Pulse:   (!) 58 62  Resp: 19     Temp:   98.4 F (36.9 C) 98.6 F (37 C)  TempSrc:   Oral Oral  SpO2:   98% 97%  Weight:  65.2 kg    Height:        General: Pt is alert, awake, not in acute distress Cardiovascular: RRR, S1/S2 +, no rubs, no gallops Respiratory: CTA bilaterally, no wheezing, no rhonchi Abdominal: Soft, NT, ND, bowel sounds + Extremities: no edema, no cyanosis    The results of significant diagnostics from this hospitalization (including imaging, microbiology, ancillary and laboratory)  are listed below for reference.     Microbiology: No results found for this or any previous visit (from the past 240 hour(s)).   Labs: BNP (last 3 results) Recent Labs    11/01/18 1953  BNP 109.3*   Basic Metabolic Panel: Recent Labs  Lab 11/09/18 0513 11/09/18 2010 11/10/18 0200 11/10/18 1311 11/13/18 0656 11/14/18 0233 11/15/18 0221  NA 136 137 137 136 139 138 137  K 4.4 4.0 3.9 4.0 3.9 3.7 4.1  CL 95* 90* 92*  --  102 103 104  CO2  31 29 31   --  27 25 23   GLUCOSE 193* 231* 239*  --  94 89 128*  BUN 20 30* 35*  --  27* 18 15  CREATININE 0.82 0.96 1.00  --  0.71 0.62 0.72  CALCIUM 8.5* 9.0 8.6*  --  8.4* 8.6* 8.5*  MG 1.7 1.9 1.8  --  2.1 2.2 2.1  PHOS 3.6  --  5.4*  --   --   --   --    Liver Function Tests: Recent Labs  Lab 11/09/18 0513 11/10/18 0200  ALBUMIN 2.2* 2.3*   No results for input(s): LIPASE, AMYLASE in the last 168 hours. No results for input(s): AMMONIA in the last 168 hours. CBC: Recent Labs  Lab 11/11/18 0554 11/12/18 0348 11/13/18 0656 11/14/18 0233 11/15/18 0221  WBC 15.4* 12.7* 8.4 14.3* 14.3*  HGB 8.1* 8.0* 8.8* 9.2* 8.5*  HCT 25.8* 24.9* 28.3* 29.3* 27.9*  MCV 68.8* 67.8* 69.7* 69.8* 70.6*  PLT 356 398 459* 559* 634*   Cardiac Enzymes: No results for input(s): CKTOTAL, CKMB, CKMBINDEX, TROPONINI in the last 168 hours. BNP: Invalid input(s): POCBNP CBG: Recent Labs  Lab 11/14/18 2033 11/14/18 2242 11/15/18 0809 11/15/18 1204 11/15/18 1704  GLUCAP 153* 140* 107* 133* 145*   D-Dimer No results for input(s): DDIMER in the last 72 hours. Hgb A1c Recent Labs    11/14/18 0233  HGBA1C 8.0*   Lipid Profile No results for input(s): CHOL, HDL, LDLCALC, TRIG, CHOLHDL, LDLDIRECT in the last 72 hours. Thyroid function studies No results for input(s): TSH, T4TOTAL, T3FREE, THYROIDAB in the last 72 hours.  Invalid input(s): FREET3 Anemia work up Recent Labs    11/13/18 1249  VITAMINB12 2,569*  FOLATE 21.6  FERRITIN 314*   TIBC 304  IRON 61  RETICCTPCT 3.2*   Urinalysis    Component Value Date/Time   COLORURINE YELLOW 11/04/2018 0841   APPEARANCEUR CLEAR 11/04/2018 0841   LABSPEC 1.020 11/04/2018 0841   PHURINE 5.0 11/04/2018 0841   GLUCOSEU NEGATIVE 11/04/2018 0841   HGBUR MODERATE (A) 11/04/2018 0841   BILIRUBINUR NEGATIVE 11/04/2018 0841   BILIRUBINUR negative 05/23/2015 1120   KETONESUR 80 (A) 11/04/2018 0841   PROTEINUR 100 (A) 11/04/2018 0841   UROBILINOGEN 0.2 05/23/2015 1120   UROBILINOGEN 0.2 12/14/2013 1203   NITRITE NEGATIVE 11/04/2018 0841   LEUKOCYTESUR NEGATIVE 11/04/2018 0841   Sepsis Labs Invalid input(s): PROCALCITONIN,  WBC,  LACTICIDVEN Microbiology No results found for this or any previous visit (from the past 240 hour(s)).   Time coordinating discharge: 67mns  SIGNED:   JKathie Dike MD  [facility] 11/15/2018, 5:53 PM   If 7PM-7AM, please contact night-coverage [email or web address]

## 2018-11-15 NOTE — Progress Notes (Signed)
  Speech Language Pathology Treatment: Dysphagia  Patient Details Name: Aimee Parker MRN: [id number] DOB: 1950-01-17 Today's Date: 11/15/2018 Time: 1145-1200 SLP Time Calculation (min) (ACUTE ONLY): 15 min  Assessment / Plan / Recommendation Clinical Impression  Pt seen at bedside during lunch for assessment of diet tolerance and education. Pt tolerated trials of  magic cup and nectar thick liquid, without overt s/s aspiration. Daughter was present, and indicated pt does not like pureed foods, and has been eating [place] soup brought from home. RN reports pt lungs are clear, and she is afebrile. Safe swallow precautions posted at [facility]. Will continue current diet, and follow pt to determine readiness to advance.    HPI HPI: 68 y.o.F with PMH of DM and HTN who presents with 2-3 days of fever, malaise and fatigue. She was found to have probable bilateral PNA and UTI. Developed worsening respiratory distress with increased work of breathing and accessory muscle use requiring endotracheal intubation 11/02/2018-10/29. Post extubation stridor, improved somewhat with steroids and epinephrine. Still present suggesting vocal cord dysfunction per MD.      SLP Plan  Continue with current plan of care;Goals updated       Recommendations  Diet recommendations: Dysphagia 1 (puree);Nectar-thick liquid Liquids provided via: Cup;Straw Medication Administration: Whole meds with puree Supervision: Patient able to self feed;Intermittent supervision to cue for compensatory strategies Compensations: Slow rate;Small sips/bites Postural Changes and/or Swallow Maneuvers: Seated upright 90 degrees                Oral Care Recommendations: Oral care BID Follow up Recommendations: Inpatient Rehab SLP Visit Diagnosis: Dysphagia, oropharyngeal phase (R13.12) Plan: Continue with current plan of care;Goals updated       [place], [facility], [place] Pathologist Office:  [phone] Pager: [phone]  Shonna Chock 11/15/2018, 12:20 PM

## 2018-11-16 ENCOUNTER — Inpatient Hospital Stay (HOSPITAL_COMMUNITY): Payer: Medicare Other | Admitting: Physical Therapy

## 2018-11-16 ENCOUNTER — Inpatient Hospital Stay (HOSPITAL_COMMUNITY): Payer: Medicare Other

## 2018-11-16 ENCOUNTER — Other Ambulatory Visit: Payer: Self-pay

## 2018-11-16 ENCOUNTER — Inpatient Hospital Stay (HOSPITAL_COMMUNITY): Payer: Medicare Other | Admitting: Occupational Therapy

## 2018-11-16 DIAGNOSIS — I63119 Cerebral infarction due to embolism of unspecified vertebral artery: Secondary | ICD-10-CM

## 2018-11-16 LAB — COMPREHENSIVE METABOLIC PANEL
ALT: 21 U/L (ref 0–44)
AST: 26 U/L (ref 15–41)
Albumin: 2.8 g/dL — ABNORMAL LOW (ref 3.5–5.0)
Alkaline Phosphatase: 72 U/L (ref 38–126)
Anion gap: 9 (ref 5–15)
BUN: 15 mg/dL (ref 8–23)
CO2: 21 mmol/L — ABNORMAL LOW (ref 22–32)
Calcium: 8.3 mg/dL — ABNORMAL LOW (ref 8.9–10.3)
Chloride: 110 mmol/L (ref 98–111)
Creatinine, Ser: 0.71 mg/dL (ref 0.44–1.00)
GFR calc Af Amer: >60 mL/min (ref >60)
GFR calc non Af Amer: >60 mL/min (ref >60)
Glucose, Bld: 97 mg/dL (ref 70–99)
Potassium: 4 mmol/L (ref 3.5–5.1)
Sodium: 140 mmol/L (ref 135–145)
Total Bilirubin: 0.8 mg/dL (ref 0.3–1.2)
Total Protein: 6.6 g/dL (ref 6.5–8.1)

## 2018-11-16 LAB — GLUCOSE, CAPILLARY
Glucose-Capillary: 92 mg/dL (ref 70–99)
Glucose-Capillary: 99 mg/dL (ref 70–99)
Glucose-Capillary: 149 mg/dL — ABNORMAL HIGH (ref 70–99)
Glucose-Capillary: 153 mg/dL — ABNORMAL HIGH (ref 70–99)

## 2018-11-16 LAB — CBC WITH DIFFERENTIAL/PLATELET
Abs Immature Granulocytes: 0.14 10*3/uL — ABNORMAL HIGH (ref 0.00–0.07)
Basophils Absolute: 0.1 10*3/uL (ref 0.0–0.1)
Basophils Relative: 1 %
Eosinophils Absolute: 0.2 10*3/uL (ref 0.0–0.5)
Eosinophils Relative: 2 %
HCT: 28 % — ABNORMAL LOW (ref 36.0–46.0)
Hemoglobin: 8.6 g/dL — ABNORMAL LOW (ref 12.0–15.0)
Immature Granulocytes: 1 %
Lymphocytes Relative: 31 %
Lymphs Abs: 3.4 10*3/uL (ref 0.7–4.0)
MCH: 22.1 pg — ABNORMAL LOW (ref 26.0–34.0)
MCHC: 30.7 g/dL (ref 30.0–36.0)
MCV: 72 fL — ABNORMAL LOW (ref 80.0–100.0)
Monocytes Absolute: 0.6 10*3/uL (ref 0.1–1.0)
Monocytes Relative: 6 %
Neutro Abs: 6.4 10*3/uL (ref 1.7–7.7)
Neutrophils Relative %: 59 %
Platelets: 626 10*3/uL — ABNORMAL HIGH (ref 150–400)
RBC: 3.89 MIL/uL (ref 3.87–5.11)
RDW: 16.7 % — ABNORMAL HIGH (ref 11.5–15.5)
WBC: 10.8 10*3/uL — ABNORMAL HIGH (ref 4.0–10.5)
nRBC: 0 % (ref 0.0–0.2)

## 2018-11-16 LAB — MAGNESIUM: Magnesium: 2 mg/dL (ref 1.7–2.4)

## 2018-11-16 MED ORDER — IPRATROPIUM-ALBUTEROL 0.5-2.5 (3) MG/3ML IN SOLN: 3.0000 mL | Freq: Four times a day (QID) | RESPIRATORY_TRACT | Status: DC | PRN

## 2018-11-16 MED FILL — LANTUS 100 UNITS/ML VIAL: 100 | 90 days supply | Qty: 30 | Fill #0

## 2018-11-16 MED FILL — ALBUTEROL SUL 2.5 MG/3 ML S: (2.5 MG/3ML | 4 days supply | Qty: 75 | Fill #0

## 2018-11-16 NOTE — Evaluation (Signed)
Occupational Therapy Assessment and Plan  Patient Details  Name: Aimee Parker MRN: [id number] Date of Birth: 03-21-1950  OT Diagnosis: ataxia and muscle weakness (generalized) Rehab Potential: Rehab Potential (ACUTE ONLY): Good ELOS: ~10-12 days   Today's Date: 11/16/2018 OT Individual Time: 1335-1430 OT Individual Time Calculation (min): 55 min     Problem List:  Patient Active Problem List   Diagnosis Date Noted  . DM (diabetes mellitus), type 2 ([place]) 11/15/2018  . Embolic stroke ([place]) 77/82/4235  . Urinary tract infection without hematuria   . E coli bacteremia 11/08/2018  . Cerebral embolism with cerebral infarction 11/07/2018  . Pneumonia   . Pyelonephritis   . Endotracheal tube present   . Septic shock ([place])   . Sepsis ([place]) 11/01/2018  . Acute respiratory failure ([place]) 11/01/2018  . Endophthalmitis, acute 05/13/2017  . Healthcare maintenance 09/09/2016  . DM (diabetes mellitus) type 2, uncontrolled, with ketoacidosis ([place]) 05/17/2014  . Heart murmur 12/14/2013  . Cough 07/06/2013  . Hypertriglyceridemia 07/06/2013  . Type 2 diabetes mellitus without complication, with long-term current use of insulin ([place]) 07/06/2013  . Essential hypertension, benign 12/22/2012  . Hyperglycemia 11/17/2012  . Hypertension, uncontrolled 11/17/2012    Past Medical History:  Past Medical History:  Diagnosis Date  . Diabetes mellitus without complication ([place])   . Hypertension   . Vitamin D deficiency    Past Surgical History:  Past Surgical History:  Procedure Laterality Date  . EYE SURGERY     Left eye surgery  . PARS PLANA VITRECTOMY Right 05/13/2017   Procedure: PARS PLANA VITRECTOMY 25 GAUGE FOR ENDOPHTHALMITIS;  Surgeon: Hurman Horn, MD;  Location: [facility];  Service: Ophthalmology;  Laterality: Right;    Assessment & Plan Clinical Impression: Patient is a 68 y.o. year old female with history of T2DM, HTN, vitamin D deficiency who was admitted on 11/01/18 with two day  history of fevers up to 103, fatigue, dyspnea and AMS. She was placed on BIPAP and started on broad spectrum antibiotics but continued to have increased WOB with tachypnea intubation. CTA negative for PE and she was found to have AKI with metabolic acidosis and elevated troponins due to demand ischemia.  She developed septic shock due to E coli bacteremia.  CT abdomen/pelvis showed evidence of pyelonephritis with moderate left perinephritic edema and mild left ureter prominence. Blood cultures X 2 positive for Enterobacteriaceae and E coli. 2  D echo showed moderate LVH with EF 60-65% and question of PFO. AKI treated with IVF for hydration and thrombocytopenia felt to be due to sepsis. On 10/25, she was found to have decrease in LOC with diffuse weakness and inability to follow commands. CT head done revealing hypodensity in left posterior cerebellums and extensive low density in body and splenium of corpus callosum. CTA head/neck showed occlusion of left ACA proximal A2 segment, short segment occlusion proximal L-PCA and severe short segment stenosis in L-VA.  She continued to have fevers with concerns of meningitis and ID consulted for input on septic emboli due to endocarditis. MRI brain showed restricted diffusion with swelling and edema within callosal body and splenium, left cingulate gyrus, left globus pallidus, acute ischemic infarct in left cerebellum and bilateral mastoid effusions large on right with fluid in right eustachian tube.   Dr. Leonie Man felt that stroke embolic due to sepsis v/s paradoxical embolism.  TEE done revealing EF 55-60% with no thrombus, mass,shunt or PFO/ASD. BLE dopplers negative for DVT. Her mentation continued to improve with increase in  ability to follow commands and Dr. Baxter Flattery recommended 10 day course of antibiotics with 10/23 as day 1. Patient developed bursts of SVT with A fib on 10/28 and was started on amiodarone with IV heparin. She converted to NSR and tolerated extubation  on 10/29 but noted to have post extubation stridor requiring steroids as well as epi and placed briefly on BIPAP. Beside swallow done and patient started on dysphagia 1, nectars due to SOB and dysphonia.  She has been transitioned to Eliquis and completed her antibiotic course. Has had issues with asymptomatic bradycardia with down tol HRs 40-50. She continues to be limited by dysphonia with DOE, fatigue,  RLE weakness and right body inattention. CIR recommended due to functional decline.  Patient transferred to CIR on 11/15/2018 .    Patient currently requires min with basic self-care skills and basic mobiltiy  secondary to muscle weakness, decreased cardiorespiratoy endurance, impaired timing and sequencing, ataxia, decreased coordination and decreased motor planning, decreased awareness, decreased safety awareness and decreased memory and decreased standing balance, decreased postural control, decreased balance strategies and difficulty maintaining precautions.  Prior to hospitalization, patient could complete ADL with independent .  Patient will benefit from skilled intervention to decrease level of assist with basic self-care skills and increase independence with basic self-care skills prior to discharge home with care partner.  Anticipate patient will require intermittent supervision and follow up outpatient.  OT - End of Session Activity Tolerance: Tolerates 30+ min activity with multiple rests Endurance Deficit: Yes Endurance Deficit Description: frequent rest breaks OT Assessment Rehab Potential (ACUTE ONLY): Good OT Patient demonstrates impairments in the following area(s): Balance;Safety;Cognition;Edema;Motor OT Basic ADL's Functional Problem(s): Grooming;Bathing;Dressing;Toileting OT Transfers Functional Problem(s): Tub/Shower;Toilet OT Additional Impairment(s): None OT Plan OT Intensity: Minimum of 1-2 x/day, 45 to 90 minutes OT Frequency: 5 out of 7 days OT Duration/Estimated Length  of Stay: ~10-12 days OT Treatment/Interventions: Balance/vestibular training;Discharge planning;Pain management;Self Care/advanced ADL retraining;Therapeutic Activities;UE/LE Coordination activities;Cognitive remediation/compensation;Disease mangement/prevention;Functional mobility training;Patient/family education;Skin care/wound managment;Therapeutic Exercise;Community reintegration;DME/adaptive equipment instruction;Neuromuscular re-education;Psychosocial support;UE/LE Strength taining/ROM OT Self Feeding Anticipated Outcome(s): n/a OT Basic Self-Care Anticipated Outcome(s): supervision OT Toileting Anticipated Outcome(s): supervision OT Bathroom Transfers Anticipated Outcome(s): modI OT Recommendation Patient destination: Home Follow Up Recommendations: Outpatient OT Equipment Recommended: To be determined   Skilled Therapeutic Intervention Ot eval initiated with Ot goals, purpose and role discussed with pt, pt's sister with interpreter present. Self care retraining at shower level. Pt does demonstrate some dysmetria and decr standing balance. Functional ambulation to and from the bathroom with HHA with min A with narrow BOS. Pt showered with min A and dressed with min A with cues for safety. Pt did demonstrate decr emergent awareness. Pt performed functional ambulation to the RN station and back to her room with min to mod A with HHA. Pt does require a rest break in the bed afterwards.   OT Evaluation Precautions/Restrictions  Precautions Precautions: Fall General Chart Reviewed: Yes Family/Caregiver Present: Yes(daughter) Vital Signs   Pain Pain Assessment Pain Score: 0-No pain Home Living/Prior Functioning Home Living Family/patient expects to be discharged to:: Private residence Living Arrangements: Spouse/significant other, Children Available Help at Discharge: Family, Available 24 hours/day Type of Home: House Home Access: Stairs to enter Technical brewer of Steps:  3 Entrance Stairs-Rails: None Home Layout: One level  Lives With: Spouse, Family Prior Function Level of Independence: Independent with gait, Independent with transfers  Able to Take Stairs?: Yes Vocation: Full time employment ADL ADL Grooming: Setup Where Assessed-Grooming: Sitting at sink Upper  Body Bathing: Minimal assistance Where Assessed-Upper Body Bathing: Shower Lower Body Bathing: Moderate assistance Where Assessed-Lower Body Bathing: Shower Upper Body Dressing: Minimal assistance Where Assessed-Upper Body Dressing: Sitting at sink Where Assessed-Lower Body Dressing: Wheelchair Toileting: Moderate assistance Where Assessed-Toileting: Glass blower/designer: Psychiatric nurse Method: Counselling psychologist: Grab bars, Drop arm Geophysical data processor: Moderate assistance Social research officer, government Method: Stand pivot, Magazine features editor Baseline Vision/History: (recently had corrective sx) Patient Visual Report: No change from baseline(will continue to assess) Perception  Perception: Within Functional Limits Praxis Praxis: Impaired Praxis Impairment Details: Motor planning Cognition Overall Cognitive Status: Impaired/Different from baseline Arousal/Alertness: Awake/alert Orientation Level: Person;Place;Situation Person: Oriented Place: Oriented Situation: Oriented Year: 2020 Month: November Day of Week: Incorrect(tuesday) Memory: Appears intact Memory Impairment: Decreased recall of new information Immediate Memory Recall: Sock;Blue;Bed Memory Recall Sock: Not able to recall Memory Recall Blue: With Cue Memory Recall Bed: Not able to recall Attention: Focused;Sustained Focused Attention: Appears intact Sustained Attention: Appears intact Selective Attention: Appears intact Awareness: Impaired Awareness Impairment: Emergent impairment Problem Solving:  Impaired Problem Solving Impairment: Functional complex Safety/Judgment: Impaired Sensation Sensation Light Touch: Appears Intact Proprioception: Appears Intact Motor  Motor Motor: Ataxia;Abnormal postural alignment and control Motor - Skilled Clinical Observations: ataxic in standing with turns Mobility  Bed Mobility Rolling Right: Contact Guard/Touching assist Rolling Left: Contact Guard/Touching assist Supine to Sit: Contact Guard/Touching assist Sit to Supine: Contact Guard/Touching assist Transfers Sit to Stand: Minimal Assistance - Patient > 75%  Trunk/Postural Assessment  Cervical Assessment Cervical Assessment: Within Functional Limits Thoracic Assessment Thoracic Assessment: Within Functional Limits Lumbar Assessment Lumbar Assessment: Within Functional Limits Postural Control Righting Reactions: delayed  Balance Static Sitting Balance Static Sitting - Balance Support: Bilateral upper extremity supported;Feet supported Static Sitting - Level of Assistance: 5: Stand by assistance Dynamic Sitting Balance Dynamic Sitting - Balance Support: No upper extremity supported;Feet supported;During functional activity Dynamic Sitting - Level of Assistance: 4: Min assist Sitting balance - Comments: leaning back with hip flexion exercises Static Standing Balance Static Standing - Balance Support: Bilateral upper extremity supported;During functional activity Dynamic Standing Balance Dynamic Standing - Balance Support: Bilateral upper extremity supported;During functional activity Dynamic Standing - Level of Assistance: 4: Min assist;3: Mod assist Extremity/Trunk Assessment RUE Assessment RUE Assessment: Within Functional Limits LUE Assessment General Strength Comments: mild dysmetria/ ataxia     Refer to Care Plan for Long Term Goals  Recommendations for other services: None    Discharge Criteria: Patient will be discharged from OT if patient refuses treatment 3  consecutive times without medical reason, if treatment goals not met, if there is a change in medical status, if patient makes no progress towards goals or if patient is discharged from hospital.  The above assessment, treatment plan, treatment alternatives and goals were discussed and mutually agreed upon: by patient  Nicoletta Ba 11/16/2018, 4:30 PM

## 2018-11-16 NOTE — Patient Care Conference (Signed)
Inpatient RehabilitationTeam Conference and Plan of Care Update Date: 11/16/2018   Time: 10:50 AM   Patient Name: Aimee Parker      Medical Record Number: [id number]  Date of Birth: Mar 05, 1950 Sex: Female         Room/Bed: 4M05C/4M05C-01 Payor Info: Payor: Advertising copywriter MEDICARE / Plan: [employer] MEDICARE / Product Type: *No Product type* /    Admit Date/Time:  11/15/2018  9:24 PM  Primary Diagnosis:  <principal problem not specified>  Patient Active Problem List   Diagnosis Date Noted  . DM (diabetes mellitus), type 2 ([facility]) 11/15/2018  . Embolic stroke ([facility]) 11/15/2018  . Urinary tract infection without hematuria   . E coli bacteremia 11/08/2018  . Cerebral embolism with cerebral infarction 11/07/2018  . Pneumonia   . Pyelonephritis   . Endotracheal tube present   . Septic shock ([facility])   . Sepsis ([facility]) 11/01/2018  . Acute respiratory failure ([facility]) 11/01/2018  . Endophthalmitis, acute 05/13/2017  . Healthcare maintenance 09/09/2016  . DM (diabetes mellitus) type 2, uncontrolled, with ketoacidosis ([facility]) 05/17/2014  . Heart murmur 12/14/2013  . Cough 07/06/2013  . Hypertriglyceridemia 07/06/2013  . Type 2 diabetes mellitus without complication, with long-term current use of insulin ([facility]) 07/06/2013  . Essential hypertension, benign 12/22/2012  . Hyperglycemia 11/17/2012  . Hypertension, uncontrolled 11/17/2012    Expected Discharge Date: Expected Discharge Date: (TBD)  Team Members Present: Physician leading conference: Dr. Claudette Laws Social Worker Present: Dossie Der, LCSW Nurse Present: Mosetta Pigeon, RN Case manager: Roderic Palau, RN PT Present: Woodfin Ganja, PT OT Present: Perrin Maltese, OT SLP Present: Colin Benton, SLP PPS Coordinator present : Fae Pippin, SLP     Current Status/Progress Goal Weekly Team Focus  Bowel/Bladder   cont/incont B&B, used bedpan last night until PT eval completed, LBM 11/3 and loose, scheduled senna held x1dose  pt  increase cont and hold stool softeners as needed  timed toileting q3h while awake to increase continence   Swallow/Nutrition/ Hydration   Eval Pending  Eval Pending      ADL's   eval pending         Mobility   eval pending  eval pending  eval pending   Communication             Safety/Cognition/ Behavioral Observations  Eval Pending  Eval Pending      Pain   pt has occasional complaints of pain 5/10 in either abdomen or head, tylenol available prn and is effective at reducing pain level  pain controlled with tylenol <3/10  assess pain qshift and prn and medicate or reposition as needed   Skin   skin integrity intact beside IV in L FA saline locked  skin remain free from breakdown and irritation  assess skin qshift and prn and cleanse daily+prn      *See Care Plan and progress notes for long and short-term goals.     Barriers to Discharge  Current Status/Progress Possible Resolutions Date Resolved   Nursing                  PT  Decreased caregiver support;Medical stability;Home environment access/layout                 OT                  SLP                SW  Discharge Planning/Teaching Needs:  Home with husband and children who will assist with her care. Will have 24 hr care via daughter. New eval today      Team Discussion: Cerebellar CVA, limited English, gesturing, HTN controlled, AF rate controlled, DM looks good.  RN - calling appropriately, tylenol gen pain.  OT pending eval, PT eval pending.  SLP swallow upgrade to D3thins, full S.  Needs dentures from family.  Cognition did fine, delayed processing, reduced verbal output, decreased safety awareness, L inattention, visual changes, used video interpreter.  Son and Liliane Bade are contacts for [place] speaking.   Revisions to Treatment Plan: N/A     Medical Summary Current Status: cerebellar infarcts , good strength but reduce balance, cognitive deficits and language barrieer Weekly Focus/Goal: initiate  rehab program  Barriers to Discharge: Medical stability  Barriers to Discharge Comments: permissive HTN Possible Resolutions to Barriers: tighten control of BP prior to D/C   Continued Need for Acute Rehabilitation Level of Care: The patient requires daily medical management by a physician with specialized training in physical medicine and rehabilitation for the following reasons: Direction of a multidisciplinary physical rehabilitation program to maximize functional independence : Yes Medical management of patient stability for increased activity during participation in an intensive rehabilitation regime.: Yes Analysis of laboratory values and/or radiology reports with any subsequent need for medication adjustment and/or medical intervention. : Yes   I attest that I was present, lead the team conference, and concur with the assessment and plan of the team.   Retta Diones 11/16/2018, 3:23 PM  Team conference was held via web/ teleconference due to [place] - 19

## 2018-11-16 NOTE — Evaluation (Signed)
Speech Language Pathology Assessment and Plan  Patient Details  Name: Aimee Parker MRN: [id number] Date of Birth: 06/04/1950  SLP Diagnosis: Speech and Language deficits;Dysphagia  Rehab Potential: Good ELOS: 10-14 days    Today's Date: 11/16/2018 SLP Individual Time: 0805-0900 SLP Individual Time Calculation (min): 55 min   Problem List:  Patient Active Problem List   Diagnosis Date Noted  . DM (diabetes mellitus), type 2 ([place]) 11/15/2018  . Embolic stroke ([place]) 66/44/0347  . Urinary tract infection without hematuria   . E coli bacteremia 11/08/2018  . Cerebral embolism with cerebral infarction 11/07/2018  . Pneumonia   . Pyelonephritis   . Endotracheal tube present   . Septic shock ([place])   . Sepsis ([place]) 11/01/2018  . Acute respiratory failure ([place]) 11/01/2018  . Endophthalmitis, acute 05/13/2017  . Healthcare maintenance 09/09/2016  . DM (diabetes mellitus) type 2, uncontrolled, with ketoacidosis ([place]) 05/17/2014  . Heart murmur 12/14/2013  . Cough 07/06/2013  . Hypertriglyceridemia 07/06/2013  . Type 2 diabetes mellitus without complication, with long-term current use of insulin ([place]) 07/06/2013  . Essential hypertension, benign 12/22/2012  . Hyperglycemia 11/17/2012  . Hypertension, uncontrolled 11/17/2012   Past Medical History:  Past Medical History:  Diagnosis Date  . Diabetes mellitus without complication ([place])   . Hypertension   . Vitamin D deficiency    Past Surgical History:  Past Surgical History:  Procedure Laterality Date  . EYE SURGERY     Left eye surgery  . PARS PLANA VITRECTOMY Right 05/13/2017   Procedure: PARS PLANA VITRECTOMY 25 GAUGE FOR ENDOPHTHALMITIS;  Surgeon: Hurman Horn, MD;  Location: [place];  Service: Ophthalmology;  Laterality: Right;    Assessment / Plan / Recommendation Clinical Impression 68 year old female with past medical history significant for HTN and DM. History of fevers, dyspnea and fatigue. Temp 103.2 at urgent care  and sent to ED on 11/01/2018. Patient was hypotensive, tachypnic with increased O2 requirement. Patient placed on BIPAP with PCCM consulted and admitted to ICU for septic shock due to UTI and Pneumonia. Progressive dyspnea and eventually intubated on 11/02/2018. Patient treated for acute respiratory failure due to pulmonary edema, sepsis with E. Coli bacteremia and pyelonephritis. Hospital course complicated by cerebellar and diffuse cortical and subcortical infarcts noted on MRI brain on 11/06/2018. Neurology consulted and recommended starting on DAPT for 3 weeks followed by ASA alone. Dr. Erlinda Hong 11/1 recommended Eliquis and asa. Hgb A1c 8 started on statin.Patient develop A. Fib with RVR on 10/30. Began IV amiodarone and Lovenox. She converted to sinus rhythm and transitioned to metoprolol for rate control and continued on Lovenox. Extubated on 10/28 and transitioned to 2 liters [place]. Continue to wean oxygen and albuterol prn. Sepsis secondary to E. Coli bacteremia/pyelonephritis with mild hydronephrosis. Heart TTE and TEE negative for vegetation. ID recommended Ancef or Keflex for 10 days using 10/23 as Day 1. Transitioned to Keflex on 11/1 . Afib with RVR now sinus rhythm and not symptomatic., Continue Metoprolol and Apixaban. [facility]  Pt presents with mild-moderate aspiration risk, primarily due to respiratory deficits with noted improvement via chart review and communication with present respiratory therapist during evaluation. Pt demonstrated WFL oral motor function during CN exam, except for limited lingual ROM and adequate lower dentition with upper dentures not present (pt supports wearing during PO consumption at home.) Pt consumed dys 3 and dys 2 textures, with slightly prolonged mastication on  dys 3, likely due to missing dentition, however swallow appeared [facility], with ability to form and  clear bolus. Pt consumed x5 ice chips, x5 thin via TSP, x 5 small cup sips thin and 3oz thin via straw, with only x1 delayed cough with thin liquids via TSP. Pt demonstrated x2 coughs without PO intake. SLP recommends upgrade to thin liquids and dys 3 with full supervision A for tolerances and continued assessment of vital signs and respiratory function. [place] interpreter was utilized during evaluation, no family presents to confirm cognitive linguistic baseline and skills were difficult to assess due to language barrier with majority of time spent on BSS. Pt demonstrates some intellectual awareness (delayed processing and reduced verbal output), basic problem solving with call bell, ability to follow basic commands, delayed recall of 4/4 words, named items in room and sustained/selective attention. Following communication with evaluating PT, noting reduced safety awareness, left inattention, repetition cuing required for problem solving/following commands and changes in vision. Pt would benefit from continued cognitive-linguistic assessment, since working prior to acute hospitalization.  Pt presents no dysarthria, only hoarse vocal quality not impacting intelligibility. Pt would benefit from skilled ST services in order to maximize functional independence and reduce burden of care, likely requiring 24 hour supervision and continue ST services.   Skilled Therapeutic Interventions          Skilled ST services focused on swallow skills. SLP administered BSS, educated pt on results and created plan to address deficits. All questions were answered to satisfaction. Pt was left in room with call bell within reach and bed alarm set. ST recommends to continue skilled ST services.  SLP Assessment  Patient will need skilled Speech Lanaguage Pathology Services during CIR admission    Recommendations  SLP Diet Recommendations: Dysphagia 3 (Mech soft);Thin Liquid Administration via: Straw;Cup Medication  Administration: Whole meds with liquid Supervision: Patient able to self feed;Full supervision/cueing for compensatory strategies(recently upgraded diet) Compensations: Slow rate;Small sips/bites Postural Changes and/or Swallow Maneuvers: Seated upright 90 degrees Oral Care Recommendations: Oral care BID Patient destination: Home Follow up Recommendations: 24 hour supervision/assistance;Home Health SLP(possibly continued ST services in cognition) Equipment Recommended: None recommended by SLP    SLP Frequency 3 to 5 out of 7 days   SLP Duration  SLP Intensity  SLP Treatment/Interventions 10-14 days  Minumum of 1-2 x/day, 30 to 90 minutes  Cognitive remediation/compensation;Cueing hierarchy;Functional tasks;Dysphagia/aspiration precaution training;Medication managment;Patient/family education    Pain Pain Assessment Pain Score: 0-No pain  Prior Functioning Cognitive/Linguistic Baseline: Information not available Type of Home: House  Lives With: Spouse;Family Available Help at Discharge: Family;Available 24 hours/day Vocation: Full time employment  SLP Evaluation Cognition Overall Cognitive Status: Impaired/Different from baseline Arousal/Alertness: Awake/alert Orientation Level: Disoriented to time;Oriented to situation;Oriented to person;Oriented to place Attention: Focused;Sustained Focused Attention: Appears intact Sustained Attention: Appears intact Memory: Appears intact Awareness: Impaired Awareness Impairment: Emergent impairment;Anticipatory impairment Problem Solving: Impaired Problem Solving Impairment: Functional complex;Verbal complex Safety/Judgment: Impaired  Comprehension Auditory Comprehension Overall Auditory Comprehension: Appears within functional limits for tasks assessed Commands: Within Functional Limits Expression Expression Primary Mode of Expression: Verbal Verbal Expression Overall Verbal Expression: Appears within functional limits for  tasks assessed Initiation: No impairment Written Expression Dominant Hand: Right Oral Motor Oral Motor/Sensory Function Overall Oral Motor/Sensory Function: Within functional limits Motor Speech Overall Motor Speech: Impaired Phonation: Hoarse Resonance: Within functional limits Articulation: Within functional limitis Intelligibility: Intelligible Motor Planning: Witnin functional limits Motor Speech Errors: Not applicable   PMSV Assessment  PMSV Trial Intelligibility: Intelligible  Bedside Swallowing Assessment General Diet Prior to this Study: Nectar-thick liquids;Dysphagia 1 (puree) Respiratory Status: Room air History of Recent Intubation: Yes Length of Intubations (days): 9 days Date extubated: 11/10/18 Behavior/Cognition: Alert;Cooperative;Pleasant mood Oral Cavity - Dentition: Adequate natural dentition;Missing dentition(missing upper dentures) Vision: Functional for self-feeding Patient Positioning: Upright in bed Baseline Vocal Quality: Hoarse Volitional Cough: Weak Volitional Swallow: Able to elicit  Oral Care Assessment Does patient have any of the following "high(er) risk" factors?: None of the above Does patient have any of the following "at risk" factors?: None of the above Patient is LOW RISK: Follow universal precautions (see row information) Ice Chips Ice chips: Within functional limits Presentation: Cup;Spoon Thin Liquid Thin Liquid: Impaired Presentation: Straw;Cup;Self Fed;Spoon Pharyngeal  Phase Impairments: Cough - Delayed Nectar Thick Nectar Thick Liquid: Not tested Honey Thick Honey Thick Liquid: Not tested Puree Puree: Within functional limits Solid Solid: Impaired Presentation: Self Fed Oral Phase Impairments: Impaired mastication BSE Assessment Risk for Aspiration Impact on safety and function: Moderate aspiration risk;Mild aspiration risk Other Related Risk Factors: Prolonged intubation;Previous CVA;Decreased respiratory  status  Short Term Goals: Week 1: SLP Short Term Goal 1 (Week 1): Pt will consume dys 3 textured diet with minimal s/s of overt aspiration and supervision A verbal cues for swallow strategies. SLP Short Term Goal 2 (Week 1): Pt will consume thin liquid diet with minimal s/s of overt aspiration including WFL vital signs and supervision A verbal cues for swallow strategies. SLP Short Term Goal 3 (Week 1): Pt will participate in continued cognitive lingustic assessment.  Refer to Care Plan for Long Term Goals  Recommendations for other services: None   Discharge Criteria: Patient will be discharged from SLP if patient refuses treatment 3 consecutive times without medical reason, if treatment goals not met, if there is a change in medical status, if patient makes no progress towards goals or if patient is discharged from hospital.  The above assessment, treatment plan, treatment alternatives and goals were discussed and mutually agreed upon: by patient  Meleane Selinger  [facility] 11/16/2018, 4:17 PM

## 2018-11-16 NOTE — Progress Notes (Signed)
[place] PHYSICAL MEDICINE & REHABILITATION PROGRESS NOTE   Subjective/Complaints:  Signaling need for bathroom No pain c/os  ROS- limited by cognition /language   Objective:   No results found. Recent Labs    11/15/18 0221 11/16/18 0539  WBC 14.3* 10.8*  HGB 8.5* 8.6*  HCT 27.9* 28.0*  PLT 634* 626*   Recent Labs    11/15/18 0221 11/16/18 0539  NA 137 140  K 4.1 4.0  CL 104 110  CO2 23 21*  GLUCOSE 128* 97  BUN 15 15  CREATININE 0.72 0.71  CALCIUM 8.5* 8.3*   No intake or output data in the 24 hours ending 11/16/18 0738   Physical Exam: Vital Signs Blood pressure 122/60, pulse 60, temperature 97.9 F (36.6 C), temperature source Oral, resp. rate 20, height 5' 1.42" (1.56 m), weight 60.1 kg, SpO2 97 %.   General: No acute distress Mood and affect are appropriate Heart: Regular rate and rhythm no rubs murmurs or extra sounds Lungs: Clear to auscultation, breathing unlabored, no rales or wheezes Abdomen: Positive bowel sounds, soft nontender to palpation, nondistended Extremities: No clubbing, cyanosis, or edema Skin: No evidence of breakdown, no evidence of rash Neurologic: Cranial nerves II through XII intact, motor strength is 5/5 in bilateral deltoid, bicep, tricep, grip, hip flexor, knee extensors, ankle dorsiflexor and plantar flexor Sensory exam normal sensation to light touch and proprioception in bilateral upper and lower extremities Cerebellar exam normal finger to nose to finger as well as heel to shin in bilateral upper and lower extremities Musculoskeletal: Full range of motion in all 4 extremities. No joint swelling  Assessment/Plan: 1. Functional deficits secondary to Cerebellar infarct which require 3+ hours per day of interdisciplinary therapy in a comprehensive inpatient rehab setting.  Physiatrist is providing close team supervision and 24 hour management of active medical problems listed below.  Physiatrist and rehab team continue to  assess barriers to discharge/monitor patient progress toward functional and medical goals  Care Tool:  Bathing  Bathing activity did not occur: Safety/medical concerns           Bathing assist       Upper Body Dressing/Undressing Upper body dressing   What is the patient wearing?: Hospital gown only    Upper body assist Assist Level: Moderate Assistance - Patient 50 - 74%    Lower Body Dressing/Undressing Lower body dressing    Lower body dressing activity did not occur: N/A(patient is continent, no incontinent brief on)       Lower body assist       Toileting Toileting    Toileting assist Assist for toileting: Maximal Assistance - Patient 25 - 49%     Transfers Chair/bed transfer  Transfers assist  Chair/bed transfer activity did not occur: Safety/medical concerns        Locomotion Ambulation   Ambulation assist              Walk 10 feet activity   Assist           Walk 50 feet activity   Assist           Walk 150 feet activity   Assist           Walk 10 feet on uneven surface  activity   Assist           Wheelchair     Assist               Wheelchair 50 feet with 2 turns  activity    Assist            Wheelchair 150 feet activity     Assist          Blood pressure 122/60, pulse 60, temperature 97.9 F (36.6 C), temperature source Oral, resp. rate 20, height 5' 1.42" (1.56 m), weight 60.1 kg, SpO2 97 %.  Medical Problem List and Plan: 1.  Impaired cognition and ADLs secondary to cerebellar infarction -3 hours of PT/OT/SLP at least 5 days per week Rehab evals today  2.  Antithrombotics: -DVT/anticoagulation:  Pharmaceutical: Other (comment)--Eliquis             -antiplatelet therapy: N/A 3. Pain Management: Tylenol prn for HA/knee pain.  4. Mood: LCSW to follow for evaluation and support when appropriate.              -antipsychotic agents: N/A 5. Neuropsych: This patient is  not fully capable of making decisions on her own behalf. 6. Skin/Wound Care: Routine pressure relief measures.  7. Fluids/Electrolytes/Nutrition: Monitor I/O. Does not like dysphagia diet--family supplementing from home per reports.  Check lytes in am. 8. E coli bacteremia/pyelonephritis: Completed antibiotic course on 11/2 9. Pulmonary edema with ARF: 10 A fib with RVR: HR controlled on  11.  Asymptomatic bradycardia: Improving with decrease in metoprolol to 25 mg on 11/02. 12. HTN:  Monitor BP bid--continue Metoprolol.  13. T2DM: Hgb A1c-8.0.  Monitor BS ac/hs. Was on Janumet PTA. AKI has resolved--resume [place] today and metformin tomorrow if lytes sable and wean off Lantus..  CBG (last 3)  Recent Labs    11/15/18 1704 11/15/18 2158 11/16/18 0626  GLUCAP 145* 116* 92   14. Leucocytosis: WBC 15.4-->8.4-->14.3  trending up--monitor for signs of infection. Currently being treated with Keflex 500mg  for UTI.  15. Anemia: Folic GYFV/C94 WNL. Will monitor H/H and watch for signs of bleeding. 11.0-->8.5.   16. Thrombocytosis: Likely reactive. Recheck in am.  17. Constipation: Bowel regimen with Docusate 100mg  BID PRN and Senna 8.6mg  BID [place].    LOS: 1 days A FACE TO FACE EVALUATION WAS PERFORMED  Charlett Blake 11/16/2018, 7:38 AM

## 2018-11-16 NOTE — Progress Notes (Signed)
Inpatient Rehabilitation  Patient information reviewed and entered into eRehab system by Alaija Ruble M. Charee Tumblin, M.A., CCC/SLP, PPS Coordinator.  Information including medical coding, functional ability and quality indicators will be reviewed and updated through discharge.    

## 2018-11-16 NOTE — Discharge Instructions (Addendum)
Inpatient Rehab Discharge Instructions  Aimee Parker Discharge date and time: 11/22/18   Activities/Precautions/ Functional Status: Activity: no lifting, driving, or strenuous exercise till cleared by MD Diet: cardiac diet and diabetic diet--food has to be chopped Wound Care: none needed   Functional status:  ___ No restrictions     ___ Walk up steps independently _X__ 24/7 supervision/assistance   ___ Walk up steps with assistance ___ Intermittent supervision/assistance  ___ Bathe/dress independently ___ Walk with walker     ___ Bathe/dress with assistance ___ Walk Independently    ___ Shower independently ___ Walk with assistance    _X__ Shower with assistance _X__ No alcohol     ___ Return to work/school ________   Special Instructions: 1. Check blood sugars before meals and at bedtime. 2. Drink plenty of water. 3.  Call tomorrow to set appointment with Neurology and Primary care.    COMMUNITY REFERRALS UPON DISCHARGE:    OUTPATIENT [facility]HAB[facility] PT, OT, SPT  [phone] APPOINTMENTS:  November 11 9:15-11:45    Medical Equipment/Items Ordered:ROLLING WALKER, 3 IN 1 & TUB SEAT  Agency/Supplier:[facility]   [phone]     STROKE/TIA DISCHARGE INSTRUCTIONS SMOKING Cigarette smoking nearly doubles your risk of having a stroke & is the single most alterable risk factor  If you smoke or have smoked in the last 12 months, you are advised to quit smoking for your health.  Most of the excess cardiovascular risk related to smoking disappears within a year of stopping.  Ask you doctor about anti-smoking medications  [place] Quit Line: 1-800-QUIT NOW  Free Smoking Cessation Classes [phone]  CHOLESTEROL Know your levels; limit fat & cholesterol in your diet  Lipid Panel     Component Value Date/Time   CHOL 141 11/07/2018 1139   CHOL 174 08/30/2018 1056   TRIG 132 11/07/2018 1139   HDL 18 (L) 11/07/2018 1139   HDL 53 08/30/2018 1056   CHOLHDL  7.8 11/07/2018 1139   VLDL 26 11/07/2018 1139   LDLCALC 97 11/07/2018 1139   LDLCALC 91 08/30/2018 1056      Many patients benefit from treatment even if their cholesterol is at goal.  Goal: Total Cholesterol (CHOL) less than 160  Goal:  Triglycerides (TRIG) less than 150  Goal:  HDL greater than 40  Goal:  LDL (LDLCALC) less than 100   BLOOD PRESSURE American Stroke Association blood pressure target is less that 120/80 mm/Hg  Your discharge blood pressure is:  BP: (!) 158/61  Monitor your blood pressure  Limit your salt and alcohol intake  Many individuals will require more than one medication for high blood pressure  DIABETES (A1c is a blood sugar average for last 3 months) Goal HGBA1c is under 7% (HBGA1c is blood sugar average for last 3 months)  Diabetes:     Lab Results  Component Value Date   HGBA1C 8.0 (H) 11/14/2018     Your HGBA1c can be lowered with medications, healthy diet, and exercise.  Check your blood sugar as directed by your physician  Call your physician if you experience unexplained or low blood sugars.  PHYSICAL ACTIVITY/REHABILITATION Goal is 30 minutes at least 4 days per week  Activity: Increase activity slowly, and No driving, Therapies: see above Return to work: N/A  Activity decreases your risk of heart attack and stroke and makes your heart stronger.  It helps control your weight and blood pressure; helps you relax and can improve your mood.  Participate in a  regular exercise program.  Talk with your doctor about the best form of exercise for you (dancing, walking, swimming, cycling).  DIET/WEIGHT Goal is to maintain a healthy weight  Your discharge diet is:  Diet Order            DIET DYS 2 Room service appropriate? Yes; Fluid consistency: Thin  Diet effective now             liquids Your height is:  Height: 5' 1.42" (156 cm) Your current weight is: Weight: 59.4 kg Your Body Mass Index (BMI) is:  BMI (Calculated): 24.41   Following the type of diet specifically designed for you will help prevent another stroke.  You are at goal weight   Your goal Body Mass Index (BMI) is 19-24.  Healthy food habits can help reduce 3 risk factors for stroke:  High cholesterol, hypertension, and excess weight.  RESOURCES Stroke/Support Group:  Call [phone]   STROKE EDUCATION PROVIDED/REVIEWED AND GIVEN TO PATIENT Stroke warning signs and symptoms How to activate emergency medical system (call 911). Medications prescribed at discharge. Need for follow-up after discharge. Personal risk factors for stroke. Pneumonia vaccine given:  Flu vaccine given:  My questions have been answered, the writing is legible, and I understand these instructions.  I will adhere to these goals & educational materials that have been provided to me after my discharge from the hospital.     My questions have been answered and I understand these instructions. I will adhere to these goals and the provided educational materials after my discharge from the hospital.  Patient/Caregiver Signature _______________________________ Date __________  Clinician Signature _______________________________________ Date __________  Please bring this form and your medication list with you to all your follow-up doctor's appointments.     Information on my medicine - ELIQUIS (apixaban)  Why was Eliquis prescribed for you? Eliquis was prescribed for you to reduce the risk of a blood clot forming that can cause a stroke if you have a medical condition called atrial fibrillation (a type of irregular heartbeat).  What do You need to know about Eliquis ? Take your Eliquis TWICE DAILY - one tablet in the morning and one tablet in the evening with or without food. If you have difficulty swallowing the tablet whole please discuss with your pharmacist how to take the medication safely.  Take Eliquis exactly as prescribed by your doctor and DO NOT stop taking  Eliquis without talking to the doctor who prescribed the medication.  Stopping may increase your risk of developing a stroke.  Refill your prescription before you run out.  After discharge, you should have regular check-up appointments with your healthcare provider that is prescribing your Eliquis.  In the future your dose may need to be changed if your kidney function or weight changes by a significant amount or as you get older.  What do you do if you miss a dose? If you miss a dose, take it as soon as you remember on the same day and resume taking twice daily.  Do not take more than one dose of ELIQUIS at the same time to make up a missed dose.  Important Safety Information A possible side effect of Eliquis is bleeding. You should call your healthcare provider right away if you experience any of the following: ? Bleeding from an injury or your nose that does not stop. ? Unusual colored urine (red or dark brown) or unusual colored stools (red or black). ? Unusual bruising for unknown reasons. ?  A serious fall or if you hit your head (even if there is no bleeding).  Some medicines may interact with Eliquis and might increase your risk of bleeding or clotting while on Eliquis. To help avoid this, consult your healthcare provider or pharmacist prior to using any new prescription or non-prescription medications, including herbals, vitamins, non-steroidal anti-inflammatory drugs (NSAIDs) and supplements.  This website has more information on Eliquis (apixaban): http://[email or web address].com/eliquis/home

## 2018-11-16 NOTE — Care Management Note (Signed)
[facility] Individual Statement of Services  Patient Name:  Aimee Parker  Date:  11/16/2018  Welcome to the [facility].  Our goal is to provide you with an individualized program based on your diagnosis and situation, designed to meet your specific needs.  With this comprehensive rehabilitation program, you will be expected to participate in at least 3 hours of rehabilitation therapies Monday-Friday, with modified therapy programming on the weekends.  Your rehabilitation program will include the following services:  Physical Therapy (PT), Occupational Therapy (OT), Speech Therapy (ST), 24 hour per day rehabilitation nursing, Case Management (Social Worker), Rehabilitation Medicine, Nutrition Services and Pharmacy Services  Weekly team conferences will be held on Wednesday to discuss your progress.  Your Social Worker will talk with you frequently to get your input and to update you on team discussions.  Team conferences with you and your family in attendance may also be held.  Expected length of stay: 10-14 days  Overall anticipated outcome: supervision with cueing  Depending on your progress and recovery, your program may change. Your Social Worker will coordinate services and will keep you informed of any changes. Your Social Worker's name and contact numbers are listed  below.  The following services may also be recommended but are not provided by the [facility]:    [place] will be made to provide these services after discharge if needed.  Arrangements include referral to agencies that provide these services.  Your insurance has been verified to be:  [employer]-medicare & Medicaid Your primary doctor is: Geryl Rankins  Pertinent information will be shared with your doctor and your insurance company.  Social Worker:  Ovidio Kin, [place] or  [phone]  Information discussed with and copy given to patient by: Elease Hashimoto, 11/16/2018, 3:23 PM

## 2018-11-16 NOTE — Plan of Care (Signed)
  Problem: Consults Goal: RH STROKE PATIENT EDUCATION Description: See Patient Education module for education specifics  Outcome: Progressing Goal: Nutrition Consult-if indicated Outcome: Progressing Goal: Diabetes Guidelines if Diabetic/Glucose > 140 Description: If diabetic or lab glucose is > 140 mg/dl - Initiate Diabetes/Hyperglycemia Guidelines & Document Interventions  Outcome: Progressing   Problem: RH BOWEL ELIMINATION Goal: RH STG MANAGE BOWEL WITH ASSISTANCE Description: STG Manage Bowel with Assistance. Outcome: Progressing Flowsheets (Taken 11/16/2018 1620) STG: Pt will manage bowels with assistance: 4-Minimum assistance   Problem: RH BLADDER ELIMINATION Goal: RH STG MANAGE BLADDER WITH ASSISTANCE Description: STG Manage Bladder With Assistance Outcome: Progressing Flowsheets (Taken 11/16/2018 1620) STG: Pt will manage bladder with assistance: 4-Minimal assistance   Problem: RH SKIN INTEGRITY Goal: RH STG SKIN FREE OF INFECTION/BREAKDOWN Outcome: Progressing   Problem: RH SAFETY Goal: RH STG ADHERE TO SAFETY PRECAUTIONS W/ASSISTANCE/DEVICE Description: STG Adhere to Safety Precautions With Assistance/Device. Outcome: Progressing Flowsheets (Taken 11/16/2018 1620) STG:Pt will adhere to safety precautions with assistance/device: 4-Minimal assistance   Problem: RH PAIN MANAGEMENT Goal: RH STG PAIN MANAGED AT OR BELOW PT'S PAIN GOAL Outcome: Progressing   Problem: RH KNOWLEDGE DEFICIT Goal: RH STG INCREASE KNOWLEDGE OF DIABETES Outcome: Progressing Goal: RH STG INCREASE KNOWLEDGE OF HYPERTENSION Outcome: Progressing Goal: RH STG INCREASE KNOWLEDGE OF DYSPHAGIA/FLUID INTAKE Outcome: Progressing Goal: RH STG INCREASE KNOWLEDGE OF STROKE PROPHYLAXIS Outcome: Progressing

## 2018-11-16 NOTE — H&P (Signed)
[facility] PHYSICAL MEDICINE & REHABILITATION PROGRESS NOTE   Subjective/Complaints:  Signaling need for bathroom No pain c/os  ROS- limited by cognition /language   Objective:   No results found. Recent Labs    11/15/18 0221 11/16/18 0539  WBC 14.3* 10.8*  HGB 8.5* 8.6*  HCT 27.9* 28.0*  PLT 634* 626*   Recent Labs    11/15/18 0221 11/16/18 0539  NA 137 140  K 4.1 4.0  CL 104 110  CO2 23 21*  GLUCOSE 128* 97  BUN 15 15  CREATININE 0.72 0.71  CALCIUM 8.5* 8.3*   No intake or output data in the 24 hours ending 11/16/18 1000   Physical Exam: Vital Signs Blood pressure 122/60, pulse 74, temperature 97.9 F (36.6 C), temperature source Oral, resp. rate 18, height 5' 1.42" (1.56 m), weight 60.1 kg, SpO2 97 %.   General: No acute distress Mood and affect are appropriate Heart: Regular rate and rhythm no rubs murmurs or extra sounds Lungs: Clear to auscultation, breathing unlabored, no rales or wheezes Abdomen: Positive bowel sounds, soft nontender to palpation, nondistended Extremities: No clubbing, cyanosis, or edema Skin: No evidence of breakdown, no evidence of rash Neurologic: Cranial nerves II through XII intact, motor strength is 5/5 in bilateral deltoid, bicep, tricep, grip, hip flexor, knee extensors, ankle dorsiflexor and plantar flexor Sensory exam normal sensation to light touch and proprioception in bilateral upper and lower extremities Cerebellar exam normal finger to nose to finger as well as heel to shin in bilateral upper and lower extremities Musculoskeletal: Full range of motion in all 4 extremities. No joint swelling  Assessment/Plan: 1. Functional deficits secondary to Cerebellar infarct which require 3+ hours per day of interdisciplinary therapy in a comprehensive inpatient rehab setting.  Physiatrist is providing close team supervision and 24 hour management of active medical problems listed below.  Physiatrist and rehab team continue to  assess barriers to discharge/monitor patient progress toward functional and medical goals  Care Tool:  Bathing  Bathing activity did not occur: Safety/medical concerns           Bathing assist       Upper Body Dressing/Undressing Upper body dressing   What is the patient wearing?: Hospital gown only    Upper body assist Assist Level: Moderate Assistance - Patient 50 - 74%    Lower Body Dressing/Undressing Lower body dressing    Lower body dressing activity did not occur: N/A(patient is continent, no incontinent brief on)       Lower body assist       Toileting Toileting    Toileting assist Assist for toileting: Moderate Assistance - Patient 50 - 74%     Transfers Chair/bed transfer  Transfers assist  Chair/bed transfer activity did not occur: Safety/medical concerns        Locomotion Ambulation   Ambulation assist              Walk 10 feet activity   Assist           Walk 50 feet activity   Assist           Walk 150 feet activity   Assist           Walk 10 feet on uneven surface  activity   Assist           Wheelchair     Assist               Wheelchair 50 feet with 2 turns  activity    Assist            Wheelchair 150 feet activity     Assist          Blood pressure 122/60, pulse 74, temperature 97.9 F (36.6 C), temperature source Oral, resp. rate 18, height 5' 1.42" (1.56 m), weight 60.1 kg, SpO2 97 %.  Medical Problem List and Plan: 1.  Impaired cognition and ADLs secondary to cerebellar infarction -3 hours of PT/OT/SLP at least 5 days per week Rehab evals today  2.  Antithrombotics: -DVT/anticoagulation:  Pharmaceutical: Other (comment)--Eliquis             -antiplatelet therapy: N/A 3. Pain Management: Tylenol prn for HA/knee pain.  4. Mood: LCSW to follow for evaluation and support when appropriate.              -antipsychotic agents: N/A 5. Neuropsych: This patient is  not fully capable of making decisions on her own behalf. 6. Skin/Wound Care: Routine pressure relief measures.  7. Fluids/Electrolytes/Nutrition: Monitor I/O. Does not like dysphagia diet--family supplementing from home per reports.  Check lytes in am. 8. E coli bacteremia/pyelonephritis: Completed antibiotic course on 11/2 9. Pulmonary edema with ARF: 10 A fib with RVR: HR controlled on  11.  Asymptomatic bradycardia: Improving with decrease in metoprolol to 25 mg on 11/02. 12. HTN:  Monitor BP bid--continue Metoprolol.  13. T2DM: Hgb A1c-8.0.  Monitor BS ac/hs. Was on Janumet PTA. AKI has resolved--resume [place] today and metformin tomorrow if lytes sable and wean off Lantus..  CBG (last 3)  Recent Labs    11/15/18 1704 11/15/18 2158 11/16/18 0626  GLUCAP 145* 116* 92   14. Leucocytosis: WBC 15.4-->8.4-->14.3  trending up--monitor for signs of infection. Currently being treated with Keflex 500mg  for UTI.  15. Anemia: Folic INOM/V67 WNL. Will monitor H/H and watch for signs of bleeding. 11.0-->8.5.   16. Thrombocytosis: Likely reactive. Recheck in am.  17. Constipation: Bowel regimen with Docusate 100mg  BID PRN and Senna 8.6mg  BID [place].    LOS: 1 days A FACE TO FACE EVALUATION WAS PERFORMED  I have personally performed a face to face diagnostic evaluation, including, but not limited to relevant history and physical exam findings, of this patient and developed relevant assessment and plan.  Additionally, I have reviewed and concur with the physician assistant's documentation above.  Bernis Schreur P Paulkar 11/16/2018, 10:00 AM

## 2018-11-16 NOTE — Evaluation (Signed)
Physical Therapy Assessment and Plan  Patient Details  Name: Aimee Parker MRN: [id number] Date of Birth: 1950-05-19  PT Diagnosis: Abnormality of gait, Ataxic gait, Cognitive deficits, Difficulty walking, Impaired cognition and Muscle weakness Rehab Potential: Good ELOS: 10-14 days   Today's Date: 11/16/2018 PT Individual Time: 0915-1015 PT Individual Time Calculation (min): 60 min    Problem List:  Patient Active Problem List   Diagnosis Date Noted  . DM (diabetes mellitus), type 2 ([place]) 11/15/2018  . Embolic stroke ([place]) 88/28/0034  . Urinary tract infection without hematuria   . E coli bacteremia 11/08/2018  . Cerebral embolism with cerebral infarction 11/07/2018  . Pneumonia   . Pyelonephritis   . Endotracheal tube present   . Septic shock ([place])   . Sepsis ([place]) 11/01/2018  . Acute respiratory failure ([place]) 11/01/2018  . Endophthalmitis, acute 05/13/2017  . Healthcare maintenance 09/09/2016  . DM (diabetes mellitus) type 2, uncontrolled, with ketoacidosis ([place]) 05/17/2014  . Heart murmur 12/14/2013  . Cough 07/06/2013  . Hypertriglyceridemia 07/06/2013  . Type 2 diabetes mellitus without complication, with long-term current use of insulin ([place]) 07/06/2013  . Essential hypertension, benign 12/22/2012  . Hyperglycemia 11/17/2012  . Hypertension, uncontrolled 11/17/2012    Past Medical History:  Past Medical History:  Diagnosis Date  . Diabetes mellitus without complication ([place])   . Hypertension   . Vitamin D deficiency    Past Surgical History:  Past Surgical History:  Procedure Laterality Date  . EYE SURGERY     Left eye surgery  . PARS PLANA VITRECTOMY Right 05/13/2017   Procedure: PARS PLANA VITRECTOMY 25 GAUGE FOR ENDOPHTHALMITIS;  Surgeon: Hurman Horn, MD;  Location: [facility];  Service: Ophthalmology;  Laterality: Right;    Assessment & Plan Clinical Impression:  Aimee Parker is a 68 year old female with history of T2DM, HTN, vitamin D deficiency who  was admitted on 11/01/18 with two day history of fevers up to 103, fatigue, dyspnea and AMS. She was placed on BIPAP and started on broad spectrum antibiotics but continued to have increased WOB with tachypnea intubation. CTA negative for PE and she was found to have AKI with metabolic acidosis and elevated troponins due to demand ischemia.  She developed septic shock due to E coli bacteremia.  CT abdomen/pelvis showed evidence of pyelonephritis with moderate left perinephritic edema and mild left ureter prominence. Blood cultures X 2 positive for Enterobacteriaceae and E coli. 2  D echo showed moderate LVH with EF 60-65% and question of PFO. AKI treated with IVF for hydration and thrombocytopenia felt to be due to sepsis. On 10/25, she was found to have decrease in LOC with diffuse weakness and inability to follow commands. CT head done revealing hypodensity in left posterior cerebellums and extensive low density in body and splenium of corpus callosum. CTA head/neck showed occlusion of left ACA proximal A2 segment, short segment occlusion proximal L-PCA and severe short segment stenosis in L-VA.  She continued to have fevers with concerns of meningitis and ID consulted for input on septic emboli due to endocarditis. MRI brain showed restricted diffusion with swelling and edema within callosal body and splenium, left cingulate gyrus, left globus pallidus, acute ischemic infarct in left cerebellum and bilateral mastoid effusions large on right with fluid in right eustachian tube.   Dr. Leonie Man felt that stroke embolic due to sepsis v/s paradoxical embolism.  TEE done revealing EF 55-60% with no thrombus, mass,shunt or PFO/ASD. BLE dopplers negative for DVT. Her mentation  continued to improve with increase in ability to follow commands and Dr. Baxter Flattery recommended 10 day course of antibiotics with 10/23 as day 1. Patient developed bursts of SVT with A fib on 10/28 and was started on amiodarone with IV heparin. She  converted to NSR and tolerated extubation on 10/29 but noted to have post extubation stridor requiring steroids as well as epi and placed briefly on BIPAP. Beside swallow done and patient started on dysphagia 1, nectars due to SOB and dysphonia.  She has been transitioned to Eliquis and completed her antibiotic course. Has had issues with asymptomatic bradycardia with down tol HRs 40-50. She continues to be limited by dysphonia with DOE, fatigue,  RLE weakness and right body inattention. CIR recommended due to functional decline. Patient transferred to CIR on 11/15/2018 .   Patient currently requires mod with mobility secondary to muscle weakness, abnormal tone, ataxia and decreased coordination, decreased awareness, decreased problem solving, decreased safety awareness, decreased memory and delayed processing and decreased sitting balance, decreased standing balance, decreased postural control and decreased balance strategies.  Prior to hospitalization, patient was independent  with mobility and lived with Spouse, Family in a House home.  Home access is 3Stairs to enter.  Patient will benefit from skilled PT intervention to maximize safe functional mobility, minimize fall risk and decrease caregiver burden for planned discharge home with 24 hour supervision.  Anticipate patient will benefit from follow up [place] at discharge.  PT - End of Session Activity Tolerance: Tolerates 30+ min activity with multiple rests Endurance Deficit: Yes Endurance Deficit Description: frequent rest breaks PT Assessment Rehab Potential (ACUTE/IP ONLY): Good PT Barriers to Discharge: Decreased caregiver support;Medical stability;Home environment access/layout PT Patient demonstrates impairments in the following area(s): Balance;Endurance;Motor;Safety PT Transfers Functional Problem(s): Bed Mobility;Bed to Chair;Car;Furniture;Floor PT Locomotion Functional Problem(s): Ambulation;Wheelchair Mobility;Stairs PT Plan PT  Intensity: Minimum of 1-2 x/day ,45 to 90 minutes PT Frequency: 5 out of 7 days PT Duration Estimated Length of Stay: 10-14 days PT Treatment/Interventions: Ambulation/gait training;Balance/vestibular training;Cognitive remediation/compensation;Community reintegration;Discharge planning;Disease management/prevention;DME/adaptive equipment instruction;Functional mobility training;Neuromuscular re-education;Pain management;Patient/family education;Psychosocial support;Splinting/orthotics;Stair training;Therapeutic Activities;Therapeutic Exercise;UE/LE Strength taining/ROM;UE/LE Coordination activities;Wheelchair propulsion/positioning PT Transfers Anticipated Outcome(s): Supervision PT Locomotion Anticipated Outcome(s): Supervision with LRAD PT Recommendation Follow Up Recommendations: Home health PT Patient destination: Home Equipment Recommended: Rolling walker with 5" wheels;To be determined Equipment Details: TBD pending progress  Skilled Therapeutic Intervention Evaluation completed (see details above and below) with education on PT POC and goals and individual treatment initiated with focus on functional transfer and gait assessment. Pt received supine in bed, agreeable to PT session. No complaints of pain. Bed mobility CGA. Sit to stand with min A to RW. Stand pivot transfer bed to w/c with RW and mod A, increased cueing needed due to some ataxia and decreased safety awareness and ability to follow cues to perform transfer safely. Ambulation x 50 ft with RW and mod A due to some ataxia and one instance of near LOB. Attempt to have pt perform w/c mobility with use of BUE, min A for mobility x 15' with significant veering to the L, unable to follow verbal cues to correct. Pt initially agreeable to stay seated in w/c, set pt up with seat belt alarm. Pt then requests to return to bed despite encouragement to sit up in chair. Stand pivot transfer with w/c and min A. Sit to supine CGA. Pt left supine in  bed with needs in reach, bed alarm in place at end of session. Utilized Technical brewer for [place]  throughout therapy evaluation.   PT Evaluation Precautions/Restrictions Precautions Precautions: Fall Restrictions Weight Bearing Restrictions: No Home Living/Prior Functioning Home Living Available Help at Discharge: Family;Available 24 hours/day Type of Home: House Home Access: Stairs to enter [employer] of Steps: 3 Entrance Stairs-Rails: None Home Layout: One level  Lives With: Spouse;Family Prior Function Level of Independence: Independent with gait;Independent with transfers  Able to Take Stairs?: Yes Vision/Perception  Vision - History Patient Visual Report: Other (comment)(reports change from baseline but unable to describe)  Cognition Overall Cognitive Status: Impaired/Different from baseline Arousal/Alertness: Awake/alert Orientation Level: Oriented X4 Attention: Focused;Sustained Focused Attention: Appears intact Sustained Attention: Appears intact Memory: Impaired Memory Impairment: Decreased recall of new information Awareness: Impaired Awareness Impairment: Anticipatory impairment Problem Solving: Impaired Safety/Judgment: Impaired Sensation Sensation Light Touch: Appears Intact Proprioception: Appears Intact Coordination Gross Motor Movements are Fluid and Coordinated: No Fine Motor Movements are Fluid and Coordinated: Yes Coordination and Movement Description: impaired 2/2 generalized weakness and balance deficits Motor  Motor Motor: Ataxia;Abnormal postural alignment and control Motor - Skilled Clinical Observations: ataxic in standing with turns  Mobility Bed Mobility Bed Mobility: Rolling Right;Rolling Left;Supine to Sit;Sit to Supine Rolling Right: Contact Guard/Touching assist Rolling Left: Contact Guard/Touching assist Supine to Sit: Contact Guard/Touching assist Sit to Supine: Contact Guard/Touching  assist Transfers Transfers: Sit to Stand;Stand Pivot Transfers Sit to Stand: Minimal Assistance - Patient > 75% Stand Pivot Transfers: Moderate Assistance - Patient 50 - 74% Stand Pivot Transfer Details: Tactile cues for weight shifting;Tactile cues for placement;Verbal cues for precautions/safety Transfer (Assistive device): Rolling walker Locomotion  Gait Gait Distance (Feet): 50 Feet Assistive device: Rolling walker Gait Gait Pattern: Impaired(ataxic, narrow BOS) Gait velocity: decreased Stairs / Additional Locomotion Stairs: No Wheelchair Mobility Wheelchair Mobility: Yes Wheelchair Assistance: Minimal assistance - Patient >75% Wheelchair Propulsion: Both upper extremities Wheelchair Parts Management: Needs assistance Distance: 15  Trunk/Postural Assessment  Cervical Assessment Cervical Assessment: Within Functional Limits Thoracic Assessment Thoracic Assessment: Within Functional Limits Lumbar Assessment Lumbar Assessment: Within Functional Limits Postural Control Postural Control: Deficits on evaluation Righting Reactions: delayed Protective Responses: insufficient  Balance Balance Balance Assessed: Yes Static Sitting Balance Static Sitting - Balance Support: Bilateral upper extremity supported;Feet supported Static Sitting - Level of Assistance: 5: Stand by assistance Dynamic Sitting Balance Dynamic Sitting - Balance Support: No upper extremity supported;Feet supported;During functional activity Dynamic Sitting - Level of Assistance: 4: Min Insurance risk surveyor Standing - Balance Support: Bilateral upper extremity supported;During functional activity Static Standing - Level of Assistance: 4: Min assist Dynamic Standing Balance Dynamic Standing - Balance Support: Bilateral upper extremity supported;During functional activity Dynamic Standing - Level of Assistance: 4: Min assist;3: Mod assist Extremity Assessment   RLE Assessment RLE Assessment:  Exceptions to [facility] General Strength Comments: at least 3/5, unable to formally assess 2/2 inability to follow verbal cues LLE Assessment LLE Assessment: Exceptions to [facility] General Strength Comments: at least 3/5, unable to formally assess due to inability to follow verbal cues    Refer to Care Plan for Long Term Goals  Recommendations for other services: None   Discharge Criteria: Patient will be discharged from PT if patient refuses treatment 3 consecutive times without medical reason, if treatment goals not met, if there is a change in medical status, if patient makes no progress towards goals or if patient is discharged from hospital.  The above assessment, treatment plan, treatment alternatives and goals were discussed and mutually agreed upon: by patient   Excell Seltzer, PT, DPT 11/16/2018, 12:49  PM  

## 2018-11-16 NOTE — H&P (Signed)
Physical Medicine and Rehabilitation Admission H&P    Chief Complaint  Patient presents with  . Embolic stroke.    HPI: Aimee Parker is a 68 year old female with history of T2DM, HTN, vitamin D deficiency who was admitted on 11/01/18 with two day history of fevers up to 103, fatigue, dyspnea and AMS. She was placed on BIPAP and started on broad spectrum antibiotics but continued to have increased WOB with tachypnea intubation. CTA negative for PE and she was found to have AKI with metabolic acidosis and elevated troponins due to demand ischemia.  She developed septic shock due to E coli bacteremia.  CT abdomen/pelvis showed evidence of pyelonephritis with moderate left perinephritic edema and mild left ureter prominence. Blood cultures X 2 positive for Enterobacteriaceae and E coli. 2  D echo showed moderate LVH with EF 60-65% and question of PFO. AKI treated with IVF for hydration and thrombocytopenia felt to be due to sepsis. On 10/25, she was found to have decrease in LOC with diffuse weakness and inability to follow commands. CT head done revealing hypodensity in left posterior cerebellums and extensive low density in body and splenium of corpus callosum. CTA head/neck showed occlusion of left ACA proximal A2 segment, short segment occlusion proximal L-PCA and severe short segment stenosis in L-VA.  She continued to have fevers with concerns of meningitis and ID consulted for input on septic emboli due to endocarditis. MRI brain showed restricted diffusion with swelling and edema within callosal body and splenium, left cingulate gyrus, left globus pallidus, acute ischemic infarct in left cerebellum and bilateral mastoid effusions large on right with fluid in right eustachian tube.   Dr. Leonie Man felt that stroke embolic due to sepsis v/s paradoxical embolism.  TEE done revealing EF 55-60% with no thrombus, mass,shunt or PFO/ASD. BLE dopplers negative for DVT. Her mentation continued to improve with  increase in ability to follow commands and Dr. Baxter Flattery recommended 10 day course of antibiotics with 10/23 as day 1. Patient developed bursts of SVT with A fib on 10/28 and was started on amiodarone with IV heparin. She converted to NSR and tolerated extubation on 10/29 but noted to have post extubation stridor requiring steroids as well as epi and placed briefly on BIPAP. Beside swallow done and patient started on dysphagia 1, nectars due to SOB and dysphonia.  She has been transitioned to Eliquis and completed her antibiotic course. Has had issues with asymptomatic bradycardia with down tol HRs 40-50. She continues to be limited by dysphonia with DOE, fatigue,  RLE weakness and right body inattention. CIR recommended due to functional decline.   Spoke with patient and her daughter at bedside, and her brother by phone.     Review of Systems  Constitutional: Negative for chills and fever.  HENT: Negative for hearing loss and tinnitus.   Eyes: Negative for blurred vision and double vision.  Respiratory: Positive for cough, sputum production and shortness of breath.   Cardiovascular: Positive for chest pain (with cough ). Negative for palpitations.  Gastrointestinal: Negative for constipation (last BM in early am per daughter), heartburn and nausea.  Genitourinary: Negative for dysuria and urgency.  Musculoskeletal: Positive for joint pain (chronic knee pain occaionally). Negative for myalgias.  Skin: Negative for rash.  Neurological: Positive for weakness and headaches.  Psychiatric/Behavioral: The patient does not have insomnia.      Past Medical History:  Diagnosis Date  . Diabetes mellitus without complication ([place])   . Hypertension   .  Vitamin D deficiency     Past Surgical History:  Procedure Laterality Date  . EYE SURGERY     Left eye surgery  . PARS PLANA VITRECTOMY Right 05/13/2017   Procedure: PARS PLANA VITRECTOMY 25 GAUGE FOR ENDOPHTHALMITIS;  Surgeon: Hurman Horn, MD;   Location: [facility];  Service: Ophthalmology;  Laterality: Right;    Family History  Problem Relation Age of Onset  . Hypertension Mother   . Hypertension Father     Social History:  Married. Lives with family. 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 Walks daily and does some housework. She reports that she has never smoked. She has never used smokeless tobacco. She reports that she does not drink alcohol or use drugs.    Allergies: No Known Allergies    Medications Prior to Admission  Medication Sig Dispense Refill  . Accu-Chek Softclix Lancets lancets Use as instructed. Check blood glucose levels twice per day by fingerstick 200 each 3  . albuterol (PROVENTIL) (2.5 MG/3ML) 0.083% nebulizer solution Take 3 mLs (2.5 mg total) by nebulization every 3 (three) hours as needed for wheezing or shortness of breath. 75 mL 12  . apixaban (ELIQUIS) 5 MG TABS tablet Take 1 tablet (5 mg total) by mouth 2 (two) times daily. 60 tablet   . aspirin 81 MG chewable tablet Chew 1 tablet (81 mg total) by mouth daily.    Marland Kitchen atorvastatin (LIPITOR) 40 MG tablet Take 1 tablet (40 mg total) by mouth daily. 90 tablet 0  . Blood Glucose Monitoring Suppl (ACCU-CHEK AVIVA PLUS) w/Device KIT 1 each by Does not apply route 3 (three) times daily. METER STOPPED WORKING 1 kit 0  . glucose blood (ACCU-CHEK AVIVA) test strip Use as instructed. Check blood glucose levels twice per day by fingerstick 200 each 3  . glucose blood test strip Provide patient with insurance approved strips. Use as instructed. Inject into the skin once daily. 100 each 12  . insulin aspart (NOVOLOG) 100 UNIT/ML injection Inject 0-5 Units into the skin at bedtime. 10 mL 11  . insulin aspart (NOVOLOG) 100 UNIT/ML injection Inject 0-9 Units into the skin 3 (three) times daily with meals. 10 mL 11  . insulin glargine (LANTUS) 100 UNIT/ML injection Inject 0.2 mLs (20 Units total) into the skin daily. 10 mL 11  .  metoprolol tartrate (LOPRESSOR) 25 MG tablet Take 1 tablet (25 mg total) by mouth 2 (two) times daily.    . Vitamin D, Ergocalciferol, (DRISDOL) 1.25 MG (50000 UT) CAPS capsule Take 1 capsule (50,000 Units total) by mouth every 7 (seven) days. 12 capsule 3    Drug Regimen Review  Drug regimen was reviewed and remains appropriate with no significant issues identified  Home: Home Living Family/patient expects to be discharged to:: Private residence Living Arrangements: Spouse/significant other, Children Available Help at Discharge: Family, Available 24 hours/day Type of Home: House Home Access: Stairs to enter Technical brewer of Steps: 3 Entrance Stairs-Rails: None Home Layout: One level  Lives With: Spouse, Family   Functional History:    Functional Status:  Mobility:     Ambulation/Gait Gait Distance (Feet): 50 Feet Gait velocity: decreased Wheelchair Mobility Distance: 15  ADL:    Cognition: Cognition Overall Cognitive Status: Impaired/Different from baseline Arousal/Alertness: Awake/alert Orientation Level: Disoriented to time, Oriented to situation, Oriented to person, Oriented to place Attention: Focused, Sustained Focused Attention: Appears intact Sustained Attention: Appears intact Selective Attention: Appears intact Memory: Appears intact Memory Impairment:  Decreased recall of new information Immediate Memory Recall: Sock, Oren Binet, Bed Memory Recall Sock: Not able to recall Memory Recall Blue: With Cue Memory Recall Bed: Not able to recall Awareness: Impaired Awareness Impairment: Emergent impairment Problem Solving: Impaired Problem Solving Impairment: Functional complex Safety/Judgment: Impaired Cognition Overall Cognitive Status: Impaired/Different from baseline   Physical Exam  Nursing note and vitals reviewed. Constitutional: She is oriented to person, place, and time. She appears well-developed and well-nourished.  Easily fatigued and  closed eyes by the end of exam.   Respiratory: Stridor present. No respiratory distress. She exhibits tenderness.  Frequent congested but dry cough.   Musculoskeletal:        General: No tenderness or edema.     She had some difficulty following exam due to cognitive impairment/language barrier, but appears that she has at at least 4/5 strength throughout her upper and lower extremities.  + Impaired heel to toe and finger to nose bilaterally. Neurological: She is alert and oriented to person, place, and time.  Dysphonia noted. Able to answer most orientation questions. Able to follow simple motor commands.    Blood pressure 122/60, pulse 74, temperature 97.9 F (36.6 C), temperature source Oral, resp. rate 18, height 5' 1.42" (1.56 m), weight 60.1 kg, SpO2 97 %.   Results for orders placed or performed during the hospital encounter of 11/15/18 (from the past 48 hour(s))  Glucose, capillary     Status: Abnormal   Collection Time: 11/15/18  9:58 PM  Result Value Ref Range   Glucose-Capillary 116 (H) 70 - 99 mg/dL   Comment 1 Notify RN   CBC WITH DIFFERENTIAL     Status: Abnormal   Collection Time: 11/16/18  5:39 AM  Result Value Ref Range   WBC 10.8 (H) 4.0 - 10.5 K/uL   RBC 3.89 3.87 - 5.11 MIL/uL   Hemoglobin 8.6 (L) 12.0 - 15.0 g/dL    Comment: Reticulocyte Hemoglobin testing may be clinically indicated, consider ordering this additional test [id number]    HCT 28.0 (L) 36.0 - 46.0 %   MCV 72.0 (L) 80.0 - 100.0 fL   MCH 22.1 (L) 26.0 - 34.0 pg   MCHC 30.7 30.0 - 36.0 g/dL   RDW 16.7 (H) 11.5 - 15.5 %   Platelets 626 (H) 150 - 400 K/uL   nRBC 0.0 0.0 - 0.2 %   Neutrophils Relative % 59 %   Neutro Abs 6.4 1.7 - 7.7 K/uL   Lymphocytes Relative 31 %   Lymphs Abs 3.4 0.7 - 4.0 K/uL   Monocytes Relative 6 %   Monocytes Absolute 0.6 0.1 - 1.0 K/uL   Eosinophils Relative 2 %   Eosinophils Absolute 0.2 0.0 - 0.5 K/uL   Basophils Relative 1 %   Basophils Absolute 0.1 0.0 - 0.1 K/uL    Immature Granulocytes 1 %   Abs Immature Granulocytes 0.14 (H) 0.00 - 0.07 K/uL    Comment: Performed at [facility], [address], [place], [place] [address]  Comprehensive metabolic panel     Status: Abnormal   Collection Time: 11/16/18  5:39 AM  Result Value Ref Range   Sodium 140 135 - 145 mmol/L   Potassium 4.0 3.5 - 5.1 mmol/L   Chloride 110 98 - 111 mmol/L   CO2 21 (L) 22 - 32 mmol/L   Glucose, Bld 97 70 - 99 mg/dL   BUN 15 8 - 23 mg/dL   Creatinine, Ser 0.71 0.44 - 1.00 mg/dL   Calcium  8.3 (L) 8.9 - 10.3 mg/dL   Total Protein 6.6 6.5 - 8.1 g/dL   Albumin 2.8 (L) 3.5 - 5.0 g/dL   AST 26 15 - 41 U/L   ALT 21 0 - 44 U/L   Alkaline Phosphatase 72 38 - 126 U/L   Total Bilirubin 0.8 0.3 - 1.2 mg/dL   GFR calc non Af Amer >60 >60 mL/min   GFR calc Af Amer >60 >60 mL/min   Anion gap 9 5 - 15    Comment: Performed at [facility] [address], [place], [place] [address]  Magnesium     Status: None   Collection Time: 11/16/18  5:39 AM  Result Value Ref Range   Magnesium 2.0 1.7 - 2.4 mg/dL    Comment: Performed at [facility] [address], [place], [place] [address]  Glucose, capillary     Status: None   Collection Time: 11/16/18  6:26 AM  Result Value Ref Range   Glucose-Capillary 92 70 - 99 mg/dL  Glucose, capillary     Status: None   Collection Time: 11/16/18 11:41 AM  Result Value Ref Range   Glucose-Capillary 99 70 - 99 mg/dL  Glucose, capillary     Status: Abnormal   Collection Time: 11/16/18  4:49 PM  Result Value Ref Range   Glucose-Capillary 149 (H) 70 - 99 mg/dL   No results found.     Medical Problem List and Plan: 1.  Impaired cognition and ADLs secondary to cerebellar infarction -3 hours of PT/OT/SLP at least 5 days per week 2.  Antithrombotics: -DVT/anticoagulation:  Pharmaceutical: Other (comment)--Eliquis  -antiplatelet therapy: N/A 3. Pain Management: Tylenol prn for HA/knee pain.  4. Mood: LCSW to follow for  evaluation and support when appropriate.   -antipsychotic agents: N/A 5. Neuropsych: This patient is not fully capable of making decisions on her own behalf. 6. Skin/Wound Care: Routine pressure relief measures.  7. Fluids/Electrolytes/Nutrition: Monitor I/O. Does not like dysphagia diet--family supplementing from home per reports.  Check lytes in am. 8. E coli bacteremia/pyelonephritis: Completed antibiotic course on 11/2 9. Pulmonary edema with ARF: 10 A fib with RVR: HR controlled on  11.  Asymptomatic bradycardia: Improving with decrease in metoprolol to 25 mg on 11/02. 12. HTN:  Monitor BP bid--continue Metoprolol.  13. T2DM: Hgb A1c-8.0.  Monitor BS ac/hs. Was on Janumet PTA. AKI has resolved--resume [place] today and metformin tomorrow if lytes sable and wean off Lantus..  14. Leucocytosis: WBC 15.4-->8.4-->14.3  trending up--monitor for signs of infection. Currently being treated with Keflex '500mg'$  for UTI.  15. Anemia: Folic CEQF/D74 WNL. Will monitor H/H and watch for signs of bleeding. 11.0-->8.5.   16. Thrombocytosis: Likely reactive. Recheck in am.  17. Constipation: Bowel regimen with Docusate '100mg'$  BID PRN and Senna 8.'6mg'$  BID [place].   I have personally performed a face to face diagnostic evaluation, including, but not limited to relevant history and physical exam findings, of this patient and developed relevant assessment and plan.  Additionally, I have reviewed and concur with the physician assistant's documentation above.  Leeroy Cha, MD

## 2018-11-17 ENCOUNTER — Inpatient Hospital Stay (HOSPITAL_COMMUNITY): Payer: Medicare Other | Admitting: Occupational Therapy

## 2018-11-17 ENCOUNTER — Inpatient Hospital Stay (HOSPITAL_COMMUNITY): Payer: Medicare Other

## 2018-11-17 LAB — GLUCOSE, CAPILLARY
Glucose-Capillary: 104 mg/dL — ABNORMAL HIGH (ref 70–99)
Glucose-Capillary: 110 mg/dL — ABNORMAL HIGH (ref 70–99)
Glucose-Capillary: 191 mg/dL — ABNORMAL HIGH (ref 70–99)
Glucose-Capillary: 113 mg/dL — ABNORMAL HIGH (ref 70–99)

## 2018-11-17 NOTE — Progress Notes (Signed)
Occupational Therapy Session Note  Patient Details  Name: Aimee Parker MRN: [id number] Date of Birth: 1950/10/22  Today's Date: 11/17/2018 OT Individual Time: 8938-1017 OT Individual Time Calculation (min): 60 min    Short Term Goals: Week 1:  OT Short Term Goal 1 (Week 1): Pt will pick out clothing from standing position with contact guard OT Short Term Goal 2 (Week 1): Pt will perform 3/3 grooming tasks at the sink with min guard OT Short Term Goal 3 (Week 1): Pt will bathe 10/10 with min guard sit to stand OT Short Term Goal 4 (Week 1): Pt will perform donning footware with setup  Skilled Therapeutic Interventions/Progress Updates:    Pt completed supine to sit EOB with supervision to start session.  She then ambulated to the ortho gym without an assistive device and min assist.  Worked on standing balance as well as LUE coordination with use of the Dynavision.  She was able to complete 2 sets with an average time of 1.8-2.3 seconds while standing on the solid surface.  She then progressed to standing on the foam surface, where she needed constant min assist to maintain her balance and she was able to complete the lights in an average of 2.2 seconds.  Next, had pt step away from the Dynavision for continued work on dynamic standing balance with sue of the foam.  She worked on stepping up and off of the foam as well as standing on the foam to complete head turns and for attempted bilateral shoulder flexion with use of a dowel rod.  Min assist for stepping and off of the foam as well as mod assist to maintain balance while completing shoulder flexion.  Pt ambulated back to the room at the end of the session with min assist and transfer to the bed to rest.  Call button and phone in reach with safety alarm in place.      Therapy Documentation Precautions:  Precautions Precautions: Fall Precaution Comments: R side instability standing Restrictions Weight Bearing Restrictions: No  Pain:  No report or sign of pain  ADL: See Care Tool Section for some details of mobility and selfcare  Therapy/Group: Individual Therapy  Atalya Dano OTR/L 11/17/2018, 4:14 PM

## 2018-11-17 NOTE — Progress Notes (Signed)
Social Work Assessment and Plan   Patient Details  Name: Aimee Parker MRN: [id number] Date of Birth: 04/24/1950  Today's Date: 11/17/2018  Problem List:  Patient Active Problem List   Diagnosis Date Noted  . DM (diabetes mellitus), type 2 (HCC) 11/15/2018  . Embolic stroke (HCC) 11/15/2018  . Urinary tract infection without hematuria   . E coli bacteremia 11/08/2018  . Cerebral embolism with cerebral infarction 11/07/2018  . Pneumonia   . Pyelonephritis   . Endotracheal tube present   . Septic shock (HCC)   . Sepsis (HCC) 11/01/2018  . Acute respiratory failure (HCC) 11/01/2018  . Endophthalmitis, acute 05/13/2017  . Healthcare maintenance 09/09/2016  . DM (diabetes mellitus) type 2, uncontrolled, with ketoacidosis (HCC) 05/17/2014  . Heart murmur 12/14/2013  . Cough 07/06/2013  . Hypertriglyceridemia 07/06/2013  . Type 2 diabetes mellitus without complication, with long-term current use of insulin (HCC) 07/06/2013  . Essential hypertension, benign 12/22/2012  . Hyperglycemia 11/17/2012  . Hypertension, uncontrolled 11/17/2012   Past Medical History:  Past Medical History:  Diagnosis Date  . Diabetes mellitus without complication (HCC)   . Hypertension   . Vitamin D deficiency    Past Surgical History:  Past Surgical History:  Procedure Laterality Date  . EYE SURGERY     Left eye surgery  . PARS PLANA VITRECTOMY Right 05/13/2017   Procedure: PARS PLANA VITRECTOMY 25 GAUGE FOR ENDOPHTHALMITIS;  Surgeon: Edmon Crape, MD;  Location: [facility];  Service: Ophthalmology;  Laterality: Right;   Social History:  reports that she has never smoked. She has never used smokeless tobacco. She reports that she does not drink alcohol or use drugs.  Family / Support Systems Marital Status: Married Patient Roles: Spouse, Parent, Other (Comment)(friends) Children: Glus HA-son [phone]  Phu-daughter doesn't speak English Other Supports: An Howat-grandson  [phone] Anticipated Caregiver: Daughter Ability/Limitations of Caregiver: No limitations Caregiver Availability: 24/7 Family Dynamics: Close knit family who pull together in times of need. They have very good supports via family, extended family and friends. Daughter voiced they will be fine  Social History Preferred language: [place] Religion: Buddhist Cultural Background: [place] does not speak English needs an interpreter Education: Some schooling not much Read: Yes(vietnamese) Write: Yes(vietnamese) Employment Status: Unemployed Date Retired/Disabled/Unemployed: Laided off since COVID Name of Employer: [employer] Return to Work Plans: Not sure probably not Marine scientist Issues: No issues Guardian/Conservator: None-according to MD pt is not capable of making his own decisions while here, will look toward her family to make any decisions while here-son and grandson speak fluent English   Abuse/Neglect Abuse/Neglect Assessment Can Be Completed: Yes Physical Abuse: Denies Verbal Abuse: Denies Sexual Abuse: Denies Exploitation of patient/patient's resources: Denies Self-Neglect: Denies  Emotional Status Pt's affect, behavior and adjustment status: Pt wants to get back to her independent level, she is pushing herself hard almost too much. Needs to be told to slow down and take her time. Daughter reports she has always been the one who cared for others and is not used to this role. Recent Psychosocial Issues: healthy until got UTI and became septic Psychiatric History: No history deferred depression screen due to seems to be coping appropriately and working hard in therapies. Will see if team feels needs to be seen by neuro-psych while here Substance Abuse History: No issues  Patient / Family Perceptions, Expectations & Goals Pt/Family understanding of illness & functional limitations: Pt and daughter can explain her stroke and deficits. Although pt seems to  feel if  you walk faster she will stay on her feet. Have had interpreter in while MD rounded and he answered their questions. Premorbid pt/family roles/activities: Wife, mom, grandmother, employee, friend, etc Anticipated changes in roles/activities/participation: resume Pt/family expectations/goals: Pt states: " Need to walk more and get well."   Daughter states: " I hope she will do well but she will work hard and do her best.'  [employer]: None Premorbid Home Care/DME Agencies: None Transportation available at discharge: Family pt did not drive prior to admission  Discharge Planning Living Arrangements: Spouse/significant other, Children Support Systems: Spouse/significant other, Children, Other relatives, Friends/neighbors Type of Residence: Private residence Insurance Resources: [employer] (specify county), Multimedia programmer (specify)(UHC-Medicare) Museum/gallery curator Resources: Family Support Financial Screen Referred: No Living Expenses: Education officer, community Management: Spouse Does the patient have any problems obtaining your medications?: No Home Management: Pt and her daughter Patient/Family Preliminary Plans: Return home with her family who will provide assist. Her daughter will be her main caregiver and will be here daily and watch her in therapies to see what she will need to do to assist her. Will work on discharge needs. Social Work Anticipated Follow Up Needs: HH/OP  Clinical Impression Pleasant motivated female who pushes herself hard to do well here. Her daughter has been here daily and observing her in therapies and providing support to pt. Will work on discharge needs. Pt appears to be coping appropriately and is making good progress. Will have an interpreter here for therapies and work on discharge needs.  Elease Hashimoto 11/17/2018, 2:57 PM

## 2018-11-17 NOTE — Progress Notes (Addendum)
Speech Language Pathology Daily Session Note  Patient Details  Name: Aimee Parker MRN: [id number] Date of Birth: [birth year]  Today's Date: 11/17/2018 SLP Individual Time: 0905-1000 SLP Individual Time Calculation (min): 55 min  Short Term Goals: Week 1: SLP Short Term Goal 1 (Week 1): Pt will consume dys 3 textured diet with minimal s/s of overt aspiration and supervision A verbal cues for swallow strategies. SLP Short Term Goal 2 (Week 1): Pt will consume thin liquid diet with minimal s/s of overt aspiration including WFL vital signs and supervision A verbal cues for swallow strategies. SLP Short Term Goal 3 (Week 1): Pt will follow 2 step directions during functional tasks with min A verbal cues. SLP Short Term Goal 4 (Week 1): Pt complete basic, familar tasks with min A verbal cues for problem solving. SLP Short Term Goal 5 (Week 1): Pt will demonstrate self-monitoring and correction of errors during functional problem solving tasks with min A verbal cues.  Skilled Therapeutic Interventions: Skilled ST services focused on swallow and cognitive skills. Interpreter present for treatment session. Pt consumed thin via straw, 2oz no overt s/s aspiration, but expressed difficulty chewing solids without dentures (daughter will bring them today, per pt.) SLP downgraded solid textures to dys 2, until dentures are present. SLP continued cognitive lingustic assessment given subsections of MOCA 7.1, MOCA basic and Cognistat construction task, pt demonstrated severe impairment in semi-complex problem solving, moderate impairment in basic problem solving, moderate emergent awareness, moderate impairment in following 2 step directions and moderate impairment in higher level word finding skills. Pt demonstrates appropriate word finding skills in simple conversation and in picture description tasks, therefore SLP suggest word finding deficits due to cognitive impairment at this time. Pt demonstrated continued  intellectual awareness of deficits and errors. SLP education pt on cognitive linguistic treatment plan and pt stated agreement. SLP added cognitive linguistic goals. Pt was left in room with call bell within reach and chair alarm set. ST recommends to continue skilled ST services.      Pain Pain Assessment Pain Scale: 0-10 Pain Score: 0-No pain  Therapy/Group: Individual Therapy  Keyonta Barradas  [facility] 11/17/2018, 11:31 AM

## 2018-11-17 NOTE — Progress Notes (Signed)
[facility] PHYSICAL MEDICINE & REHABILITATION PROGRESS NOTE   Subjective/Complaints:  Signaling need for bathroom- pulling off alarm belt No pain c/os  ROS- limited by cognition /language   Objective:   No results found. Recent Labs    11/15/18 0221 11/16/18 0539  WBC 14.3* 10.8*  HGB 8.5* 8.6*  HCT 27.9* 28.0*  PLT 634* 626*   Recent Labs    11/15/18 0221 11/16/18 0539  NA 137 140  K 4.1 4.0  CL 104 110  CO2 23 21*  GLUCOSE 128* 97  BUN 15 15  CREATININE 0.72 0.71  CALCIUM 8.5* 8.3*    Intake/Output Summary (Last 24 hours) at 11/17/2018 0726 Last data filed at 11/16/2018 1800 Gross per 24 hour  Intake 120 ml  Output -  Net 120 ml     Physical Exam: Vital Signs Blood pressure 132/60, pulse 65, temperature 98.4 F (36.9 C), temperature source Oral, resp. rate 18, height 5' 1.42" (1.56 m), weight 60.1 kg, SpO2 97 %.   General: No acute distress Mood and affect are appropriate Heart: Regular rate and rhythm no rubs murmurs or extra sounds Lungs: Clear to auscultation, breathing unlabored, no rales or wheezes Abdomen: Positive bowel sounds, soft nontender to palpation, nondistended Extremities: No clubbing, cyanosis, or edema Skin: No evidence of breakdown, no evidence of rash Neurologic: Cranial nerves II through XII intact, motor strength is 5/5 in bilateral deltoid, bicep, tricep, grip, hip flexor, knee extensors, ankle dorsiflexor and plantar flexor Sensory exam normal sensation to light touch and proprioception in bilateral upper and lower extremities Cerebellar exam normal finger to nose to finger as well as heel to shin in bilateral upper and lower extremities Musculoskeletal: Full range of motion in all 4 extremities. No joint swelling  Assessment/Plan: 1. Functional deficits secondary to Cerebellar infarct which require 3+ hours per day of interdisciplinary therapy in a comprehensive inpatient rehab setting.  Physiatrist is providing close team  supervision and 24 hour management of active medical problems listed below.  Physiatrist and rehab team continue to assess barriers to discharge/monitor patient progress toward functional and medical goals  Care Tool:  Bathing  Bathing activity did not occur: Safety/medical concerns Body parts bathed by patient: Right arm, Left arm, Chest, Abdomen, Front perineal area, Right upper leg, Left upper leg, Right lower leg, Left lower leg, Face   Body parts bathed by helper: Buttocks     Bathing assist Assist Level: Minimal Assistance - Patient > 75%     Upper Body Dressing/Undressing Upper body dressing   What is the patient wearing?: Pull over shirt    Upper body assist Assist Level: Contact Guard/Touching assist    Lower Body Dressing/Undressing Lower body dressing    Lower body dressing activity did not occur: N/A(patient is continent, no incontinent brief on) What is the patient wearing?: Underwear/pull up, Pants     Lower body assist Assist for lower body dressing: Minimal Assistance - Patient > 75%     Toileting Toileting    Toileting assist Assist for toileting: Minimal Assistance - Patient > 75%     Transfers Chair/bed transfer  Transfers assist  Chair/bed transfer activity did not occur: Safety/medical concerns  Chair/bed transfer assist level: Minimal Assistance - Patient > 75%     Locomotion Ambulation   Ambulation assist      Assist level: Moderate Assistance - Patient 50 - 74% Assistive device: Walker-rolling Max distance: 50'   Walk 10 feet activity   Assist     Assist  level: Moderate Assistance - Patient - 50 - 74% Assistive device: Walker-rolling   Walk 50 feet activity   Assist    Assist level: Moderate Assistance - Patient - 50 - 74% Assistive device: Walker-rolling    Walk 150 feet activity   Assist Walk 150 feet activity did not occur: Safety/medical concerns         Walk 10 feet on uneven surface   activity   Assist Walk 10 feet on uneven surfaces activity did not occur: Safety/medical concerns         Wheelchair     Assist Will patient use wheelchair at discharge?: (TBD) Type of Wheelchair: Manual    Wheelchair assist level: Minimal Assistance - Patient > 75% Max wheelchair distance: 100'    Wheelchair 50 feet with 2 turns activity    Assist    Wheelchair 50 feet with 2 turns activity did not occur: Safety/medical concerns       Wheelchair 150 feet activity     Assist  Wheelchair 150 feet activity did not occur: Safety/medical concerns       Blood pressure 132/60, pulse 65, temperature 98.4 F (36.9 C), temperature source Oral, resp. rate 18, height 5' 1.42" (1.56 m), weight 60.1 kg, SpO2 97 %.  Medical Problem List and Plan: 1.  Impaired cognition and ADLs secondary to cerebellar infarction -3 hours of PT/OT/SLP at least 5 days per week Poor safety awareness, fall risk 2.  Antithrombotics: -DVT/anticoagulation:  Pharmaceutical: Other (comment)--Eliquis             -antiplatelet therapy: N/A 3. Pain Management: Tylenol prn for HA/knee pain.  4. Mood: LCSW to follow for evaluation and support when appropriate.              -antipsychotic agents: N/A 5. Neuropsych: This patient is not fully capable of making decisions on her own behalf. 6. Skin/Wound Care: Routine pressure relief measures.  7. Fluids/Electrolytes/Nutrition: Monitor I/O. Does not like dysphagia diet--family supplementing from home per reports.  Check lytes in am. 8. E coli bacteremia/pyelonephritis: Completed antibiotic course on 11/2 9. Pulmonary edema with ARF: 10 A fib with RVR: HR controlled on  11.  Asymptomatic bradycardia: Improving with decrease in metoprolol to 25 mg on 11/02. 12. HTN:  Monitor BP bid--continue Metoprolol.  13. T2DM: Hgb A1c-8.0.  Monitor BS ac/hs. Was on Janumet PTA. AKI has resolved--resume [place] today and metformin tomorrow if lytes sable and wean  off Lantus..  CBG (last 3)  Recent Labs    11/16/18 1649 11/16/18 2053 11/17/18 0610  GLUCAP 149* 153* 104*  Controlled 11/5 14. Leucocytosis: WBC 15.4-->8.4-->14.3--> 10.8  trending down--monitor for signs of infection. Currently being treated with Keflex 500mg  for UTI.  15. Anemia: Folic acid/B12 WNL. Will monitor H/H and watch for signs of bleeding. 11.0-->8.5.   16. Thrombocytosis: Likely reactive. Stable value 17. Constipation: Bowel regimen with Docusate 100mg  BID PRN and Senna 8.6mg  BID SCH.    LOS: 2 days A FACE TO FACE EVALUATION WAS PERFORMED  11/17/2018, 7:26 AM

## 2018-11-17 NOTE — Progress Notes (Signed)
Physical Therapy Session Note  Patient Details  Name: Aimee Parker MRN: [id number] Date of Birth: 1950-10-17  Today's Date: 11/17/2018 PT Individual Time: 1418-1530 PT Individual Time Calculation (min): 72 min   Short Term Goals: Week 1:  PT Short Term Goal 1 (Week 1): Pt will complete least restrictive transfer with min A consistently PT Short Term Goal 2 (Week 1): Pt will ambulate x 100 ft with LRAD and min A PT Short Term Goal 3 (Week 1): Pt will initiate stair training Week 2:    Week 3:     Skilled Therapeutic Interventions/Progress Updates:      Therapy Documentation Precautions:  Precautions Precautions: Fall Restrictions Weight Bearing Restrictions: No    Other Treatments:   Interpreter utilized for commnication PAIN: denies pain Pt initially sitting on edge of bed w/daughter adjacent to pt.  Educated pt/daughter of need to call for assist when getting OOB for any reason due to risk of falls, use of bed alarm for pt safety.  Daughter and pt may need reinforcement.  STS from edge of bed w/RW and cues for safety, SPT to wc w/cga.  Gait 161ft, 164ft w/RW and min assist.  Pt engaged in visual scanning to L during gait to identify haircolor of nurses in photos along L wall.  Rested 4-5 min between efforts.  wc propulsion x 40 ft w/bilat UE/s and hand over hand assist to encourage increased use of LUE, cues to scan L environment/avoid collision w/therapist on L.  Standing balance :  Pt required seated rest every 3-40min of standing activities  Sidestepping length of bars x 10 w/cues for LLE alignment w/task Alternating tapping 3inch step w/2 hands, 1 hand, no hands w/min assist only w/no UE support. stading eyes open/shut up to15 sec w/cga only Standing feet together - pt w/post R tendency requiring min assist and cues, worked on midline orientation w/pt able to maintain up to 10 sec w/cga after repeated efforts Stadning stagger step, post R tendency - min assist and cues  to wt shift,  Can wt shift and maintined 5-10 sec after repeated efforts Standing heel raises initially w/bilat UE support x 5 then without UE support w/cga x 15 additional reps   Step ups on 4inch step x20reps for LE strengthening.  Gait 145ft gym to room w/rw and cga, irregular cadence, mild trendelenberg.  Transfers to edge of bed w/RW and verbal cues, cga.  Sit to supine w/cues and supervision. Pt left supine w/rails up x 3, alarm set, bed in lowest position, and needs in reach.    Therapy/Group: Individual Therapy  [name], [name] 11/17/2018, 3:52 PM

## 2018-11-18 ENCOUNTER — Inpatient Hospital Stay (HOSPITAL_COMMUNITY): Payer: Medicare Other

## 2018-11-18 ENCOUNTER — Inpatient Hospital Stay (HOSPITAL_COMMUNITY): Payer: Medicare Other | Admitting: Physical Therapy

## 2018-11-18 ENCOUNTER — Inpatient Hospital Stay (HOSPITAL_COMMUNITY): Payer: Medicare Other | Admitting: Occupational Therapy

## 2018-11-18 LAB — GLUCOSE, CAPILLARY
Glucose-Capillary: 97 mg/dL (ref 70–99)
Glucose-Capillary: 130 mg/dL — ABNORMAL HIGH (ref 70–99)
Glucose-Capillary: 227 mg/dL — ABNORMAL HIGH (ref 70–99)

## 2018-11-18 NOTE — Progress Notes (Signed)
[facility] PHYSICAL MEDICINE & REHABILITATION PROGRESS NOTE   Subjective/Complaints: "How many days I go home?" No pain c/os bright and smiling   ROS- limited by cognition /language   Objective:   No results found. Recent Labs    11/16/18 0539  WBC 10.8*  HGB 8.6*  HCT 28.0*  PLT 626*   Recent Labs    11/16/18 0539  NA 140  K 4.0  CL 110  CO2 21*  GLUCOSE 97  BUN 15  CREATININE 0.71  CALCIUM 8.3*    Intake/Output Summary (Last 24 hours) at 11/18/2018 0718 Last data filed at 11/17/2018 1750 Gross per 24 hour  Intake 360 ml  Output -  Net 360 ml     Physical Exam: Vital Signs Blood pressure (!) 119/55, pulse (!) 58, temperature 98.6 F (37 C), temperature source Oral, resp. rate 19, height 5' 1.42" (1.56 m), weight 60.1 kg, SpO2 99 %.   General: No acute distress Mood and affect are appropriate Heart: Regular rate and rhythm no rubs murmurs or extra sounds Lungs: Clear to auscultation, breathing unlabored, no rales or wheezes Abdomen: Positive bowel sounds, soft nontender to palpation, nondistended Extremities: No clubbing, cyanosis, or edema Skin: No evidence of breakdown, no evidence of rash Neurologic: Cranial nerves II through XII intact, motor strength is 5/5 in bilateral deltoid, bicep, tricep, grip, hip flexor, knee extensors, ankle dorsiflexor and plantar flexor Sensory exam normal sensation to light touch and proprioception in bilateral upper and lower extremities Cerebellar exam normal finger to nose to finger as well as heel to shin in bilateral upper and lower extremities Musculoskeletal: Full range of motion in all 4 extremities. No joint swelling  Assessment/Plan: 1. Functional deficits secondary to Cerebellar infarct which require 3+ hours per day of interdisciplinary therapy in a comprehensive inpatient rehab setting.  Physiatrist is providing close team supervision and 24 hour management of active medical problems listed  below.  Physiatrist and rehab team continue to assess barriers to discharge/monitor patient progress toward functional and medical goals  Care Tool:  Bathing  Bathing activity did not occur: Safety/medical concerns Body parts bathed by patient: Right arm, Left arm, Chest, Abdomen, Front perineal area, Right upper leg, Left upper leg, Right lower leg, Left lower leg, Face   Body parts bathed by helper: Buttocks     Bathing assist Assist Level: Minimal Assistance - Patient > 75%     Upper Body Dressing/Undressing Upper body dressing   What is the patient wearing?: Pull over shirt    Upper body assist Assist Level: Contact Guard/Touching assist    Lower Body Dressing/Undressing Lower body dressing    Lower body dressing activity did not occur: N/A(patient is continent, no incontinent brief on) What is the patient wearing?: Underwear/pull up, Pants     Lower body assist Assist for lower body dressing: Supervision/Verbal cueing     Toileting Toileting    Toileting assist Assist for toileting: Contact Guard/Touching assist     Transfers Chair/bed transfer  Transfers assist  Chair/bed transfer activity did not occur: Safety/medical concerns  Chair/bed transfer assist level: Minimal Assistance - Patient > 75%     Locomotion Ambulation   Ambulation assist      Assist level: Minimal Assistance - Patient > 75% Assistive device: No Device Max distance: 75'   Walk 10 feet activity   Assist     Assist level: Contact Guard/Touching assist Assistive device: Walker-rolling   Walk 50 feet activity   Assist    Assist  level: Contact Guard/Touching assist Assistive device: Walker-rolling    Walk 150 feet activity   Assist Walk 150 feet activity did not occur: Safety/medical concerns  Assist level: Contact Guard/Touching assist Assistive device: Walker-rolling    Walk 10 feet on uneven surface  activity   Assist Walk 10 feet on uneven surfaces  activity did not occur: Safety/medical concerns         Wheelchair     Assist Will patient use wheelchair at discharge?: No Type of Wheelchair: Manual    Wheelchair assist level: Minimal Assistance - Patient > 75%(and cues to attend to L) Max wheelchair distance: 40    Wheelchair 50 feet with 2 turns activity    Assist    Wheelchair 50 feet with 2 turns activity did not occur: Safety/medical concerns       Wheelchair 150 feet activity     Assist  Wheelchair 150 feet activity did not occur: Safety/medical concerns       Blood pressure (!) 119/55, pulse (!) 58, temperature 98.6 F (37 C), temperature source Oral, resp. rate 19, height 5' 1.42" (1.56 m), weight 60.1 kg, SpO2 99 %.  Medical Problem List and Plan: 1.  Impaired cognition and ADLs secondary to cerebellar infarction -3 hours of PT/OT/SLP at least 5 days per week Poor safety awareness, fall risk ELOS 5-7 additional days 2.  Antithrombotics: -DVT/anticoagulation:  Pharmaceutical: Other (comment)--Eliquis             -antiplatelet therapy: N/A 3. Pain Management: Tylenol prn for HA/knee pain.  4. Mood: LCSW to follow for evaluation and support when appropriate.              -antipsychotic agents: N/A 5. Neuropsych: This patient is not fully capable of making decisions on her own behalf. 6. Skin/Wound Care: Routine pressure relief measures.  7. Fluids/Electrolytes/Nutrition: Monitor I/O. Does not like dysphagia diet--family supplementing from home per reports.  Check lytes in am. 8. E coli bacteremia/pyelonephritis: Completed antibiotic course on 11/2 9. Pulmonary edema with ARF: 10 A fib with RVR: HR controlled on  11.  Asymptomatic bradycardia: Improving with decrease in metoprolol to 25 mg on 11/02. 12. HTN:  Monitor BP bid--continue Metoprolol.  Today's Vitals   11/17/18 1209 11/17/18 1935 11/17/18 2011 11/18/18 0454  BP: (!) 122/57  133/60 (!) 119/55  Pulse: 61  64 (!) 58  Resp: 18  18 19    Temp: 98.6 F (37 C)  98.9 F (37.2 C) 98.6 F (37 C)  TempSrc: Oral   Oral  SpO2: 99%  98% 99%  Weight:      Height:      PainSc:  0-No pain    Controlled 11/6  Body mass index is 24.7 kg/m. 13. T2DM: Hgb A1c-8.0.  Monitor BS ac/hs. Was on Janumet PTA. AKI has resolved--resume [place] today and metformin tomorrow if lytes sable and wean off Lantus..  CBG (last 3)  Recent Labs    11/17/18 1648 11/17/18 2120 11/18/18 0620  GLUCAP 191* 113* 97  Controlled 11/6 14. Leucocytosis: WBC 15.4-->8.4-->14.3--> 10.8  trending down--monitor for signs of infection. Currently being treated with Keflex 500mg  for UTI.  15. Anemia: Folic TIRW/E31 WNL. Will monitor H/H and watch for signs of bleeding. 11.0-->8.5-->8.6, check stool OB   16. Thrombocytosis: Likely reactive. Stable value 17. Constipation: Bowel regimen with Docusate 100mg  BID PRN and Senna 8.6mg  BID [place].    LOS: 3 days A FACE TO FACE EVALUATION WAS PERFORMED  Charlett Blake 11/18/2018, 7:18 AM

## 2018-11-18 NOTE — Progress Notes (Signed)
Occupational Therapy Session Note  Patient Details  Name: Aimee Parker MRN: [id number] Date of Birth: 09-07-1950  Today's Date: 11/18/2018 OT Individual Time: 4742-5956 OT Individual Time Calculation (min): 75 min   Short Term Goals: Week 1:  OT Short Term Goal 1 (Week 1): Pt will pick out clothing from standing position with contact guard OT Short Term Goal 2 (Week 1): Pt will perform 3/3 grooming tasks at the sink with min guard OT Short Term Goal 3 (Week 1): Pt will bathe 10/10 with min guard sit to stand OT Short Term Goal 4 (Week 1): Pt will perform donning footware with setup  Skilled Therapeutic Interventions/Progress Updates:    Pt greeted in bed with no c/o pain. Dtr and interpretor present. D/c planning completed with DME needs solidified and relayed to SW. Both pt and dtr are in agreement of d/c date early next week. They (pt + dtr) also expressed that they want to use the RW in the home for safety. Therefore walker safety was a focus during session. Had pt practice walk-in shower transfers using device. Dtr with hands on education in regards to providing guarding-supervision assist and cues for DME mgt. They already have nonslip grips on the floor of their shower and dtr plans to place a chair outside of shower so pt can safely dress sit<stand. Pt declined to complete bathing/dressing during tx, but wanted to go outdoors to ambulate. Escorted her to the courtyard via w/c. Continued walker mgt and safety during ambulation and stand<sit transitions. Dtr did well with providing safety cuing. After returning to the room, had dtr practice assisting pt to the bathroom while using device. Extensive education regarding w/c mgt and safety was completed and then dtr was checked off on safety plan as trained caregiver. Pt returned to bed and was left with all needs within reach and dtr present. Dtr aware to inform staff when she leaves so pt can be set up with chair/bed alarms as appropriate.    Both dtr and pt expressed appreciation of family education today  Therapy Documentation Precautions:  Precautions Precautions: Fall Precaution Comments: R side instability standing Restrictions Weight Bearing Restrictions: No ADL: ADL Grooming: Setup Where Assessed-Grooming: Sitting at sink Upper Body Bathing: Minimal assistance Where Assessed-Upper Body Bathing: Shower Lower Body Bathing: Moderate assistance Where Assessed-Lower Body Bathing: Shower Upper Body Dressing: Minimal assistance Where Assessed-Upper Body Dressing: Sitting at sink Where Assessed-Lower Body Dressing: Wheelchair Toileting: Moderate assistance Where Assessed-Toileting: Glass blower/designer: Psychiatric nurse Method: Counselling psychologist: Grab bars, Drop arm Geophysical data processor: Moderate assistance Social research officer, government Method: Stand pivot, Heritage manager: Radio broadcast assistant      Therapy/Group: Individual Therapy  Tonjua Rossetti A Afsa Meany 11/18/2018, 3:57 PM

## 2018-11-18 NOTE — Progress Notes (Signed)
Physical Therapy Session Note  Patient Details  Name: Aimee Parker MRN: [id number] Date of Birth: December 12, 1950  Today's Date: 11/18/2018 PT Individual Time: 0900-0957 PT Individual Time Calculation (min): 57 min   Short Term Goals: Week 1:  PT Short Term Goal 1 (Week 1): Pt will complete least restrictive transfer with min A consistently PT Short Term Goal 2 (Week 1): Pt will ambulate x 100 ft with LRAD and min A PT Short Term Goal 3 (Week 1): Pt will initiate stair training  Skilled Therapeutic Interventions/Progress Updates:   Pt in supine and agreeable to therapy, denies pain. [place] interpretation provided throughout session. Bed mobility w/ min assist and pt ambulated around unit w/ RW >150' at a time w/ CGA. Practiced gait w/o AD, CGA-min assist >150', no overt LOB and pt excited about ambulating w/o RW. Performed Berg Balance Scale as detailed below and worked on standing balance while standing on airex pad. Stood to toss bean bags w/ progressively lower BOS w/o UE support, CGA-min assist w/ tactile cues for hip balance strategies. Practiced stair negotiation w/o rails, as per home set-up, and negotiated 8 (3")  and 4 (6") w/ CGA to ascend and min assist to descend stairs. NuStep 5 min @ level 2 w/ all extremities to work on endurance and global strengthening. Ambulated back to room and ended session in supine, all needs in reach.   Therapy Documentation Precautions:  Precautions Precautions: Fall Precaution Comments: R side instability standing Restrictions Weight Bearing Restrictions: No Pain: Pain Assessment Pain Scale: 0-10 Pain Score: 0-No pain Balance: Balance Balance Assessed: Yes Standardized Balance Assessment Standardized Balance Assessment: Berg Balance Test Berg Balance Test Sit to Stand: Able to stand without using hands and stabilize independently Standing Unsupported: Able to stand 2 minutes with supervision Sitting with Back Unsupported but Feet  Supported on Floor or Stool: Able to sit safely and securely 2 minutes Stand to Sit: Sits safely with minimal use of hands Transfers: Able to transfer safely, minor use of hands Standing Unsupported with Eyes Closed: Able to stand 10 seconds with supervision Standing Ubsupported with Feet Together: Able to place feet together independently and stand for 1 minute with supervision From Standing, Reach Forward with Outstretched Arm: Can reach forward >5 cm safely (2") From Standing Position, Pick up Object from Floor: Able to pick up shoe, needs supervision From Standing Position, Turn to Look Behind Over each Shoulder: Needs supervision when turning Turn 360 Degrees: Needs close supervision or verbal cueing Standing Unsupported, Alternately Place Feet on Step/Stool: Able to complete >2 steps/needs minimal assist Standing Unsupported, One Foot in Front: Able to take small step independently and hold 30 seconds Standing on One Leg: Unable to try or needs assist to prevent fall Total Score: 35  Therapy/Group: Individual Therapy  Oshua Mcconaha K Layonna Dobie 11/18/2018, 9:59 AM

## 2018-11-18 NOTE — Progress Notes (Signed)
Speech Language Pathology Daily Session Note  Patient Details  Name: Aimee Parker MRN: [id number] Date of Birth: [birth year]  Today's Date: 11/18/2018 SLP Individual Time: 1102-1210 SLP Individual Time Calculation (min): 68 min  Short Term Goals: Week 1: SLP Short Term Goal 1 (Week 1): Pt will consume dys 2 textured diet with minimal s/s of overt aspiration and Mod I for swallow strategies. SLP Short Term Goal 1 - Progress (Week 1): Updated due to goal met SLP Short Term Goal 2 (Week 1): Pt will consume thin liquid diet with minimal s/s of overt aspiration including WFL vital signs and Mod I cues for swallow strategies. SLP Short Term Goal 2 - Progress (Week 1): Updated due to goal met SLP Short Term Goal 3 (Week 1): Pt will follow 2 step directions during functional tasks with min A verbal cues. SLP Short Term Goal 4 (Week 1): Pt complete basic, familar tasks with min A verbal cues for problem solving. SLP Short Term Goal 5 (Week 1): Pt will demonstrate self-monitoring and correction of errors during functional problem solving tasks with min A verbal cues.  Skilled Therapeutic Interventions: Skilled ST services focused on education, swallow and cognitive skills. Pt's daughter and interpretor present. SLP facilitated PO consumption of dys 2 textures and thin via straw lunch tray demonstrated ability to self-feed (with daughter arranging items on tray) and no overt s/s aspiration noted. Pt demonstrated delayed throat clear following PO consumption and x1 without PO consumption, pt indicated no senseation of items/liquids in throat, but SLP instructed to preform strong throat clear and swallow to reduce aspiration risk. SLP reduced supervision to intermittent due to pt's ability to follow swallow strategies and vital signs WFL. SLP upgraded STG goals for swallowing. SLP acquried information from pt's daughter pertaining to baseline abilities and noted deficits, she expressed improvement in speech and  cognition "98% back to normal." SLP provided education about goals addressed in ST services and ways to cue pt in order to increase independence. SLP also facilitated basic problem solving and error awareness in novel card sequencing task, pt demonstrated ability to sequence 3 step cards in 3/3 trials and 4 step cards in 2/4 trials with cues required for error awareness during functional verse verbal sequencing. Pt demonstrated ability to describe pervious medication management in logical fashion. SLP suspects pt is near baseline for cognitive skills, recommend focusing on functional problem solving and error awareness. Pt was left in room with call bell within reach and bed alarm set. ST recommends to continue skilled ST services.      Pain Pain Assessment Pain Score: 0-No pain  Therapy/Group: Individual Therapy  Paddack Todorov  [facility] 11/18/2018, 12:34 PM

## 2018-11-18 NOTE — Progress Notes (Addendum)
Social Work Patient ID: Aimee Parker, female   DOB: 06/11/1950, 68 y.o.   MRN: [id number]  Team feels pt will reach her goals by Tuesday and MD is in agreement with. Plan on discharge Tuesday. Will make equipment arrangements and follow up. Spoke with daughter and pt with interpreter to discuss discharge date and follow up needs. Pt declined home health and OP feels not necessary. Does want rolling walker, 3 in 1 and tub seat. Will make referral for DME. Daughter voiced no interpreter needed on Tuesday due to pt's son coming to pick up and he speaks fluent [place].

## 2018-11-18 NOTE — IPOC Note (Addendum)
Overall Plan of Care [facility]) Patient Details Name: Tabitha Riggins MRN: [id number] DOB: [birth year]  Admitting Diagnosis: Embolic stroke [facility])  Hospital Problems: Principal Problem:   Embolic stroke [facility])     Functional Problem List: Nursing Endurance, Medication Management, Safety, Nutrition, Bowel  PT Balance, Endurance, Motor, Safety  OT Balance, Safety, Cognition, Edema, Motor  SLP Cognition  TR         Basic ADL's: OT Grooming, Bathing, Dressing, Toileting     Advanced  ADL's: OT       Transfers: PT Bed Mobility, Bed to Chair, Car, Furniture, Floor  OT Tub/Shower, Agricultural engineer: PT Ambulation, Emergency planning/management officer, Stairs     Additional Impairments: OT None  SLP Swallowing, Social Cognition   (continue assessment)  TR      Anticipated Outcomes Item Anticipated Outcome  Self Feeding n/a  Swallowing  Mod I   Basic self-care  supervision  Toileting  supervision   Bathroom Transfers modI  Bowel/Bladder  pt will be cont B/B with min assist  Transfers  Supervision  Locomotion  Supervision with LRAD  Communication     Cognition     Pain  pain controlled 3/10  Safety/Judgment  pt will demonstrate appropriate safety and judgement with min A   Therapy Plan: PT Intensity: Minimum of 1-2 x/day ,45 to 90 minutes PT Frequency: 5 out of 7 days PT Duration Estimated Length of Stay: 10-14 days OT Intensity: Minimum of 1-2 x/day, 45 to 90 minutes OT Frequency: 5 out of 7 days OT Duration/Estimated Length of Stay: ~10-12 days SLP Intensity: Minumum of 1-2 x/day, 30 to 90 minutes SLP Frequency: 3 to 5 out of 7 days SLP Duration/Estimated Length of Stay: 10-14 days   Due to the current state of emergency, patients may not be receiving their 3-hours of Medicare-mandated therapy.   Team Interventions: Nursing Interventions Patient/Family Education, Bowel Management, Disease Management/Prevention, Pain Management, Medication Management,  Dysphagia/Aspiration Precaution Training, Discharge Planning, Psychosocial Support  PT interventions Ambulation/gait training, Training and development officer, Cognitive remediation/compensation, Community reintegration, Discharge planning, Disease management/prevention, DME/adaptive equipment instruction, Functional mobility training, Neuromuscular re-education, Pain management, Patient/family education, Psychosocial support, Splinting/orthotics, Stair training, Therapeutic Activities, Therapeutic Exercise, UE/LE Strength taining/ROM, UE/LE Coordination activities, Wheelchair propulsion/positioning  OT Interventions Balance/vestibular training, Discharge planning, Pain management, Self Care/advanced ADL retraining, Therapeutic Activities, UE/LE Coordination activities, Cognitive remediation/compensation, Disease mangement/prevention, Functional mobility training, Patient/family education, Skin care/wound managment, Therapeutic Exercise, Community reintegration, Engineer, drilling, Neuromuscular re-education, Psychosocial support, UE/LE Strength taining/ROM  SLP Interventions Cognitive remediation/compensation, Cueing hierarchy, Functional tasks, Dysphagia/aspiration precaution training, Medication managment, Patient/family education  TR Interventions    SW/CM Interventions Discharge Planning, Psychosocial Support, Patient/Family Education   Barriers to Discharge MD Safety awareness  Nursing      PT Decreased caregiver support, Medical stability, Home environment access/layout    OT      SLP      SW       Team Discharge Planning: Destination: PT-Home ,OT- Home , SLP-Home Projected Follow-up: PT-Home health PT, OT-  Outpatient OT, SLP-24 hour supervision/assistance, Home Health SLP(possibly continued ST services in cognition) Projected Equipment Needs: PT-Rolling walker with 5" wheels, To be determined, OT- To be determined, SLP-None recommended by SLP Equipment Details: PT-TBD  pending progress, OT-  Patient/family involved in discharge planning: PT- Patient,  OT- Patient, SLP-Patient  MD ELOS: 7-10d Medical Rehab Prognosis:  Excellent Assessment:  68 year old female with history of T2DM, HTN, vitamin D deficiency who was admitted on 11/01/18  with two day history of fevers up to 103, fatigue, dyspnea and AMS. She was placed on BIPAP and started on broad spectrum antibiotics but continued to have increased WOB with tachypnea intubation. CTA negative for PE and she was found to have AKI with metabolic acidosis and elevated troponins due to demand ischemia.  She developed septic shock due to E coli bacteremia.  CT abdomen/pelvis showed evidence of pyelonephritis with moderate left perinephritic edema and mild left ureter prominence. Blood cultures X 2 positive for Enterobacteriaceae and E coli. 2  D echo showed moderate LVH with EF 60-65% and question of PFO. AKI treated with IVF for hydration and thrombocytopenia felt to be due to sepsis. On 10/25, she was found to have decrease in LOC with diffuse weakness and inability to follow commands. CT head done revealing hypodensity in left posterior cerebellums and extensive low density in body and splenium of corpus callosum. CTA head/neck showed occlusion of left ACA proximal A2 segment, short segment occlusion proximal L-PCA and severe short segment stenosis in L-VA.  She continued to have fevers with concerns of meningitis and ID consulted for input on septic emboli due to endocarditis. MRI brain showed restricted diffusion with swelling and edema within callosal body and splenium, left cingulate gyrus, left globus pallidus, acute ischemic infarct in left cerebellum and bilateral mastoid effusions large on right with fluid in right eustachian tube.   Dr. Pearlean Brownie felt that stroke embolic due to sepsis v/s paradoxical embolism.  TEE done revealing EF 55-60% with no thrombus, mass,shunt or PFO/ASD. BLE dopplers negative for DVT. Her  mentation continued to improve with increase in ability to follow commands and Dr. Drue Second recommended 10 day course of antibiotics with 10/23 as day 1. Patient developed bursts of SVT with A fib on 10/28 and was started on amiodarone with IV heparin. She converted to NSR and tolerated extubation on 10/29 but noted to have post extubation stridor requiring steroids as well as epi and placed briefly on BIPAP. Beside swallow done and patient started on dysphagia 1, nectars due to SOB and dysphonia.  She has been transitioned to Eliquis and completed her antibiotic course. Has had issues with asymptomatic bradycardia with down tol HRs 40-50. She continues to be limited by dysphonia with DOE, fatigue,  RLE weakness and right body inattention   See Team Conference Notes for weekly updates to the plan of care

## 2018-11-19 ENCOUNTER — Inpatient Hospital Stay (HOSPITAL_COMMUNITY): Payer: Medicare Other | Admitting: Occupational Therapy

## 2018-11-19 ENCOUNTER — Inpatient Hospital Stay (HOSPITAL_COMMUNITY): Payer: Medicare Other

## 2018-11-19 ENCOUNTER — Inpatient Hospital Stay (HOSPITAL_COMMUNITY): Payer: Medicare Other | Admitting: Physical Therapy

## 2018-11-19 LAB — GLUCOSE, CAPILLARY
Glucose-Capillary: 169 mg/dL — ABNORMAL HIGH (ref 70–99)
Glucose-Capillary: 168 mg/dL — ABNORMAL HIGH (ref 70–99)
Glucose-Capillary: 135 mg/dL — ABNORMAL HIGH (ref 70–99)

## 2018-11-19 MED ORDER — TRAZODONE HCL 50 MG PO TABS
50.0000 mg | ORAL_TABLET | Freq: Every evening | ORAL | Status: DC | PRN
  Administered 2018-11-19 – 2018-11-20 (×2): 50 mg via ORAL
  Filled 2018-11-19 (×2): qty 1

## 2018-11-19 NOTE — Progress Notes (Addendum)
Speech Language Pathology Daily Session Note  Patient Details  Name: Aimee Parker MRN: [id number] Date of Birth: 1950/08/25  Today's Date: 11/19/2018 SLP Individual Time: 1120-1203 SLP Individual Time Calculation (min): 43 min  Short Term Goals: Week 1: SLP Short Term Goal 1 (Week 1): Pt will consume dys 2 textured diet with minimal s/s of overt aspiration and Mod I for swallow strategies. SLP Short Term Goal 1 - Progress (Week 1): Updated due to goal met SLP Short Term Goal 2 (Week 1): Pt will consume thin liquid diet with minimal s/s of overt aspiration including WFL vital signs and Mod I cues for swallow strategies. SLP Short Term Goal 2 - Progress (Week 1): Updated due to goal met SLP Short Term Goal 3 (Week 1): Pt will follow 2 step directions during functional tasks with min A verbal cues. SLP Short Term Goal 4 (Week 1): Pt complete basic, familar tasks with min A verbal cues for problem solving. SLP Short Term Goal 5 (Week 1): Pt will demonstrate self-monitoring and correction of errors during functional problem solving tasks with min A verbal cues.  Skilled Therapeutic Interventions: Skilled ST services focused on cognitive skills. Pt's daughter was present and video interpretor was utilized during treatment session. SLP facilitated basic problem solving and error awareness skills in novel PEG design task, pt initially required max A verbal cues for problem solving/error awareness fading to supervision A verbal cues for problem solving (1/2 way through task) and min A verbal cues for error awareness (3/4th way through task.) SLP downgraded emergent awareness goal to min A due to slow progress and short ELOS. Pt was left in room with call bell within reach and bed alarm set. ST recommends to continue skilled ST services.      Pain Pain Assessment Pain Scale: 0-10 Pain Score: 0-No pain  Therapy/Group: Individual Therapy  [name]  [facility] 11/19/2018, 12:46 PM

## 2018-11-19 NOTE — Progress Notes (Signed)
Occupational Therapy Session Note  Patient Details  Name: Aimee Parker MRN: [id number] Date of Birth: 10/30/1950  Today's Date: 11/19/2018 OT Individual Time: 9678-9381 OT Individual Time Calculation (min): 58 min    Short Term Goals: Week 1:  OT Short Term Goal 1 (Week 1): Pt will pick out clothing from standing position with contact guard OT Short Term Goal 2 (Week 1): Pt will perform 3/3 grooming tasks at the sink with min guard OT Short Term Goal 3 (Week 1): Pt will bathe 10/10 with min guard sit to stand OT Short Term Goal 4 (Week 1): Pt will perform donning footware with setup  Skilled Therapeutic Interventions/Progress Updates:    Patient in bed, alert and ready for therapy session.  Bed mobility with CS, SPT and ambulation with RW to/from bed, w/c, arm chair with CS/CGA, cues at times for walker placement/safety.  Completed oral care and washing face with CS/set up seated at sink.  Completed seated and standing GMC, trunk and balance activities t/o session with CGA and cues for left side rate and full ROM - improved speed with repetition.  She is able to pick items off of the floor with CGA.  Completed seated Sheldon activities with minimal difficulty.  Ambulation on unit with CGA using RW.  She returned to bed at close of session stating that she is tired.  Bed alarm set and call bell in reach.  Therapy Documentation Precautions:  Precautions Precautions: Fall Precaution Comments: R side instability standing Restrictions Weight Bearing Restrictions: No General:   Vital Signs:  Pain: Pain Assessment Pain Scale: 0-10 Pain Score: 0-No pain  Therapy/Group: Individual Therapy  Carlos Levering 11/19/2018, 9:54 AM

## 2018-11-19 NOTE — Plan of Care (Signed)
  Problem: RH Awareness Goal: LTG: Patient will demonstrate awareness during functional activites type of (SLP) Description: LTG: Patient will demonstrate awareness during functional activites type of (SLP) Flowsheets Taken 11/19/2018 1533 LTG: Patient will demonstrate awareness during cognitive/linguistic activities with assistance of (SLP): Minimal Assistance - Patient > 75% Taken 11/17/2018 1028 Patient will demonstrate during cognitive/linguistic activities awareness type of: Emergent

## 2018-11-19 NOTE — Progress Notes (Signed)
Physical Therapy Session Note  Patient Details  Name: Aimee Parker MRN: [id number] Date of Birth: 1950-09-07  Today's Date: 11/19/2018 PT Individual Time: 2992-4268 PT Individual Time Calculation (min): 61 min   and  Today's Date: 11/19/2018 PT Missed Time: 14 Minutes Missed Time Reason: Patient ill (Comment);Patient fatigue  Short Term Goals: Week 1:  PT Short Term Goal 1 (Week 1): Pt will complete least restrictive transfer with min A consistently PT Short Term Goal 2 (Week 1): Pt will ambulate x 100 ft with LRAD and min A PT Short Term Goal 3 (Week 1): Pt will initiate stair training  Skilled Therapeutic Interventions/Progress Updates:   Pt received supine in bed with her daughter present and pt agreeable to therapy session. In person interpreter, Misaen, present throughout session. Pt's daughter present throughout session. Supine>sit, HOB flat and not using bedrails, with supervision. Ambulated ~149ft to main therapy gym using RW with CGA for steadying - pt noted to push the AD too far forward with increased anterior trunk lean causing decreased control of her balance; however, pt continued to ambulate at same gait speed without slowing down for improved control. Therapist educated pt/daughter regarding pt's decreased safety awareness with this and the importance of having daughter provide increased cuing for pt's safety. Ambulated ~263ft, no AD, with CGA/min assist for balance progressed to higher level gait challenges of horizontal head turns and sudden start/stop. Repeated sit<>stands, no UE support, x15 reps with CGA for steadying and pt demonstrating equal B LE weight bearing targeting B LE strengthening and endurance.  Dynamic standing balance tasks of:  - alternate B LE foot taps on 6" step, no UE support, with CGA for steadying - 5 cone 1/2 circle cone taps with CGA and intermittent min assist for balance and poor motor control to touch cones lightly without stepping on them - 4  square stepping over hockey sticks with CGA for steadying and intermittent min assist for balance when stepping backwards as this was the most challenging direction for her  After performing the 4 square stepping exercise pt reports she feels SOB and dizzy: SpO2 96%, HR 66bpm per pulse oximeter. Sit>supine with supervision and therapist assessed BP 135/63 (MAP 85) HR 66bpm, SpO2 99% - pt also reports feeling like her heart is beating fast (therapist assessed HR manually and confirmed reading was accurate) - pt's daughter felt that the patient likely misspoke and that the patient meant to say she was feeling SOB from the exercise. After supine rest break pt reports dizziness and SOB dissipating - pt agreeable to continue participating in therapy session. Supine>sit with close supervision/CGA for safety.  Reassessed vitals in sitting: BP 132/65 (MAP 84), HR 67bpm, SpO2 99%.  Pt started performing dynamic standing balance task of stepping forward up/down on/off 4" step with CGA/min assist for steadying/balance but then pt reporting feeling tired and requesting to walk back to her room. Ambulated ~158ft back to room using RW with CGA for steadying - pt noted to be walking with decreased speed and appearing overall more fatigued compared to at beginning of session. Sit>supine with supervision. RN notified that pt experienced dizziness during session and that vital signs were stable. Pt left supine in bed with needs in reach, bed alarm on, and pt's daugther present. Missed 14 minutes of skilled physical therapy.   Therapy Documentation Precautions:  Precautions Precautions: Fall Precaution Comments: R side instability standing Restrictions Weight Bearing Restrictions: No  Pain: No reports of pain during session.   Therapy/Group: Individual  Therapy  Tawana Scale, PT, DPT 11/19/2018, 2:31 PM

## 2018-11-20 LAB — GLUCOSE, CAPILLARY
Glucose-Capillary: 124 mg/dL — ABNORMAL HIGH (ref 70–99)
Glucose-Capillary: 140 mg/dL — ABNORMAL HIGH (ref 70–99)
Glucose-Capillary: 249 mg/dL — ABNORMAL HIGH (ref 70–99)
Glucose-Capillary: 124 mg/dL — ABNORMAL HIGH (ref 70–99)

## 2018-11-20 NOTE — Progress Notes (Signed)
[place] PHYSICAL MEDICINE & REHABILITATION PROGRESS NOTE   Subjective/Complaints:   "thank you Dr. Marland Kitchen No c/os, awake and smiling   ROS- limited by cognition /language   Objective:   No results found. No results for input(s): WBC, HGB, HCT, PLT in the last 72 hours. No results for input(s): NA, K, CL, CO2, GLUCOSE, BUN, CREATININE, CALCIUM in the last 72 hours.  Intake/Output Summary (Last 24 hours) at 11/20/2018 0635 Last data filed at 11/19/2018 1928 Gross per 24 hour  Intake 655 ml  Output -  Net 655 ml     Physical Exam: Vital Signs Blood pressure 117/61, pulse 63, temperature 98.2 F (36.8 C), temperature source Oral, resp. rate 20, height 5' 1.42" (1.56 m), weight 57.9 kg, SpO2 97 %.   General: No acute distress Mood and affect are appropriate Heart: Regular rate and rhythm no rubs murmurs or extra sounds Lungs: Clear to auscultation, breathing unlabored, no rales or wheezes Abdomen: Positive bowel sounds, soft nontender to palpation, nondistended Extremities: No clubbing, cyanosis, or edema Skin: No evidence of breakdown, no evidence of rash Neurologic: Cranial nerves II through XII intact, motor strength is 5/5 in bilateral deltoid, bicep, tricep, grip, hip flexor, knee extensors, ankle dorsiflexor and plantar flexor Sensory exam normal sensation to light touch and proprioception in bilateral upper and lower extremities Cerebellar exam normal finger to nose to finger as well as heel to shin in bilateral upper and lower extremities Musculoskeletal: Full range of motion in all 4 extremities. No joint swelling  Assessment/Plan: 1. Functional deficits secondary to Cerebellar infarct which require 3+ hours per day of interdisciplinary therapy in a comprehensive inpatient rehab setting.  Physiatrist is providing close team supervision and 24 hour management of active medical problems listed below.  Physiatrist and rehab team continue to assess barriers to  discharge/monitor patient progress toward functional and medical goals  Care Tool:  Bathing  Bathing activity did not occur: Refused Body parts bathed by patient: Right arm, Left arm, Chest, Abdomen, Front perineal area, Right upper leg, Left upper leg, Right lower leg, Left lower leg, Face   Body parts bathed by helper: Buttocks     Bathing assist Assist Level: Minimal Assistance - Patient > 75%     Upper Body Dressing/Undressing Upper body dressing   What is the patient wearing?: Pull over shirt    Upper body assist Assist Level: Contact Guard/Touching assist    Lower Body Dressing/Undressing Lower body dressing    Lower body dressing activity did not occur: N/A(patient is continent, no incontinent brief on) What is the patient wearing?: Underwear/pull up, Pants     Lower body assist Assist for lower body dressing: Supervision/Verbal cueing     Toileting Toileting    Toileting assist Assist for toileting: Supervision/Verbal cueing     Transfers Chair/bed transfer  Transfers assist  Chair/bed transfer activity did not occur: Safety/medical concerns  Chair/bed transfer assist level: Contact Guard/Touching assist     Locomotion Ambulation   Ambulation assist      Assist level: Minimal Assistance - Patient > 75% Assistive device: No Device Max distance: 2100ft   Walk 10 feet activity   Assist     Assist level: Minimal Assistance - Patient > 75% Assistive device: No Device   Walk 50 feet activity   Assist    Assist level: Minimal Assistance - Patient > 75% Assistive device: No Device    Walk 150 feet activity   Assist Walk 150 feet activity did not occur:  Safety/medical concerns  Assist level: Minimal Assistance - Patient > 75% Assistive device: No Device    Walk 10 feet on uneven surface  activity   Assist Walk 10 feet on uneven surfaces activity did not occur: Safety/medical concerns         Wheelchair     Assist Will  patient use wheelchair at discharge?: No Type of Wheelchair: Manual    Wheelchair assist level: Minimal Assistance - Patient > 75%(and cues to attend to L) Max wheelchair distance: 40    Wheelchair 50 feet with 2 turns activity    Assist    Wheelchair 50 feet with 2 turns activity did not occur: Safety/medical concerns       Wheelchair 150 feet activity     Assist  Wheelchair 150 feet activity did not occur: Safety/medical concerns       Blood pressure 117/61, pulse 63, temperature 98.2 F (36.8 C), temperature source Oral, resp. rate 20, height 5' 1.42" (1.56 m), weight 57.9 kg, SpO2 97 %.  Medical Problem List and Plan: 1.  Impaired cognition and ADLs secondary to cerebellar infarction -3 hours of PT/OT/SLP at least 5 days per week Poor safety awareness, fall risk ELOS ?11/11 2.  Antithrombotics: -DVT/anticoagulation:  Pharmaceutical: Other (comment)--Eliquis             -antiplatelet therapy: N/A 3. Pain Management: Tylenol prn for HA/knee pain.  4. Mood: LCSW to follow for evaluation and support when appropriate.              -antipsychotic agents: N/A 5. Neuropsych: This patient is not fully capable of making decisions on her own behalf. 6. Skin/Wound Care: Routine pressure relief measures.  7. Fluids/Electrolytes/Nutrition: Monitor I/O. Does not like dysphagia diet--family supplementing from home per reports.  Check lytes in am. 8. E coli bacteremia/pyelonephritis: Completed antibiotic course on 11/2 9. Pulmonary edema with ARF: 10 A fib with RVR: HR controlled on  11.  Asymptomatic bradycardia: Improving with decrease in metoprolol to 25 mg on 11/02. 12. HTN:  Monitor BP bid--continue Metoprolol.  Today's Vitals   11/19/18 1352 11/19/18 1916 11/19/18 2356 11/20/18 0454  BP: 124/64 129/60  117/61  Pulse: (!) 59 66  63  Resp: 18 16  20   Temp: 98.9 F (37.2 C) 98.6 F (37 C)  98.2 F (36.8 C)  TempSrc: Oral Oral  Oral  SpO2: 98% 99%  97%  Weight:     57.9 kg  Height:      PainSc:  0-No pain Asleep 0-No pain  Controlled 11/8  Body mass index is 23.79 kg/m. 13. T2DM: Hgb A1c-8.0.  Monitor BS ac/hs. Was on Janumet PTA. AKI has resolved--resume 13/8 today and metformin tomorrow if lytes sable and wean off Lantus..  CBG (last 3)  Recent Labs    11/19/18 1632 11/19/18 2059 11/20/18 0612  GLUCAP 168* 135* 124*  Controlled 11/8 14. Leucocytosis: WBC 15.4-->8.4-->14.3--> 10.8  trending down--monitor for signs of infection.Completed  Keflex 500mg  for UTI.  15. Anemia: Folic acid/B12 WNL. Will monitor H/H and watch for signs of bleeding. 11.0-->8.5-->8.6, check stool OB , repeat CBC in am  16. Thrombocytosis: Likely reactive. Stable value 17. Constipation: Bowel regimen with Docusate 100mg  BID PRN and Senna 8.6mg  BID SCH.    LOS: 5 days A FACE TO FACE EVALUATION WAS PERFORMED  13/8 11/20/2018, 6:35 AM

## 2018-11-21 ENCOUNTER — Inpatient Hospital Stay (HOSPITAL_COMMUNITY): Payer: Medicare Other | Admitting: Occupational Therapy

## 2018-11-21 ENCOUNTER — Inpatient Hospital Stay (HOSPITAL_COMMUNITY): Payer: Medicare Other

## 2018-11-21 LAB — BASIC METABOLIC PANEL
Anion gap: 10 (ref 5–15)
BUN: 17 mg/dL (ref 8–23)
CO2: 21 mmol/L — ABNORMAL LOW (ref 22–32)
Calcium: 9.2 mg/dL (ref 8.9–10.3)
Chloride: 110 mmol/L (ref 98–111)
Creatinine, Ser: 0.86 mg/dL (ref 0.44–1.00)
GFR calc Af Amer: >60 mL/min (ref >60)
GFR calc non Af Amer: >60 mL/min (ref >60)
Glucose, Bld: 123 mg/dL — ABNORMAL HIGH (ref 70–99)
Potassium: 4.1 mmol/L (ref 3.5–5.1)
Sodium: 141 mmol/L (ref 135–145)

## 2018-11-21 LAB — CBC
HCT: 28.1 % — ABNORMAL LOW (ref 36.0–46.0)
Hemoglobin: 8.6 g/dL — ABNORMAL LOW (ref 12.0–15.0)
MCH: 22.1 pg — ABNORMAL LOW (ref 26.0–34.0)
MCHC: 30.6 g/dL (ref 30.0–36.0)
MCV: 72.2 fL — ABNORMAL LOW (ref 80.0–100.0)
Platelets: 576 10*3/uL — ABNORMAL HIGH (ref 150–400)
RBC: 3.89 MIL/uL (ref 3.87–5.11)
RDW: 18.3 % — ABNORMAL HIGH (ref 11.5–15.5)
WBC: 8.3 10*3/uL (ref 4.0–10.5)
nRBC: 0 % (ref 0.0–0.2)

## 2018-11-21 LAB — GLUCOSE, CAPILLARY
Glucose-Capillary: 124 mg/dL — ABNORMAL HIGH (ref 70–99)
Glucose-Capillary: 102 mg/dL — ABNORMAL HIGH (ref 70–99)
Glucose-Capillary: 172 mg/dL — ABNORMAL HIGH (ref 70–99)
Glucose-Capillary: 132 mg/dL — ABNORMAL HIGH (ref 70–99)

## 2018-11-21 NOTE — Progress Notes (Signed)
Social Work Discharge Note   The overall goal for the admission was met for:   Discharge location: Yes-HOME WITH FAMILY WHO WILL PROVIDE 24 HR CARE  Length of Stay: Yes-7 DAYS  Discharge activity level: Yes-SUPERVISION WITH CUES  Home/community participation: Yes  Services provided included: MD, RD, PT, OT, SLP, RN, CM, Pharmacy and SW  Financial Services: Medicaid and Private Insurance: U[employer]-MEDICARE  Follow-up services arranged: DME: [facility], 3 IN 1 & TUB SEAT and Patient/Family has no preference for HH/DME agencies PT  AND DAUGHTER CHANGED THEIR MIND REGARDING OP THERAPIES. SET UP [facility]IA [facility] 11/11 9:15-11:45 AM ALL THREE THERAPIES  Comments (or additional information):DAUGHTER WAS HERE DAILY AND PARTICIPATED IN THERAPIES WITH PT AND WILL BE HER CAREGIVER AT HOME  Patient/Family verbalized understanding of follow-up arrangements: Yes  Individual responsible for coordination of the follow-up plan: DAUGHTER  Confirmed correct DME delivered: Elease Hashimoto 11/21/2018    Elease Hashimoto

## 2018-11-21 NOTE — Progress Notes (Signed)
Speech Language Pathology Discharge Summary  Patient Details  Name: Aimee Parker MRN: [id number] Date of Birth: [birth year]  Today's Date: 11/21/2018 SLP Individual Time: 4327-6147 SLP Individual Time Calculation (min): 55 min   Skilled Therapeutic Interventions:  Skilled ST services focused on education and cognitive skills. Interpretor and pt's daughter present for treatment session. SLP facilitated basic problem solving and error awareness skills in pipe tree task and novel card task , pt required initial mod A verbal cues quickly fading to supervision A verbal cues. Pt demonstrated increase ability of problem solving and error awareness in familiar setting/tasks. SLP provided education to pt and pt's daughter pertaining to purpose of follow up ST services and highlighting problem solving difficulties in her familiar routine. All questions were answered to satisfaction. Pt was left in room with call bell within reach and bed alarm set. ST recommends to continue skilled ST services.     Patient has met 6 of 6 long term goals.  Patient to discharge at overall Supervision;Modified Independent;Min level.  Reasons goals not met:     Clinical Impression/Discharge Summary:   Pt made great progress meeting 6 out 6 goals discharging at Mod I for use of swallow strategies and min-supervision A for cognitive skills. Pt's diet was upgraded from NTL liquids and dys 1 to dys2 and thin liquids due to improvements in swallow function and vital signs remaining WFL. Pt preferred dys 2 diet without upper dentures present in hospital, but SLP suspects pt will consume regular textures functional once dentures are in place. Pt demonstrated improvement in basic problem solving, following 2 step directions and emergent awareness skills in functional and novel tasks. SLP suspects in a familiar environment and with familiar task pt will continue to demonstrate increase performance. Pt benefited from skilled ST services in  order to maximize functional independence and reduce burden of care, requiring supervision and continue ST services.   Care Partner:  Caregiver Able to Provide Assistance: Yes  Type of Caregiver Assistance: Physical;Cognitive  Recommendation:  24 hour supervision/assistance;Home Health SLP  Rationale for SLP Follow Up: Maximize cognitive function and independence;Reduce caregiver burden;Maximize swallowing safety   Equipment: N/A   Reasons for discharge: Discharged from hospital   Patient/Family Agrees with Progress Made and Goals Achieved: Yes    Aimee Parker  [facility] 11/21/2018, 12:33 PM

## 2018-11-21 NOTE — Progress Notes (Addendum)
[place] PHYSICAL MEDICINE & REHABILITATION PROGRESS NOTE   Subjective/Complaints:  No c/os, awake and smiling. States that her daughter is visiting at [place] today.  Hgb 8.6 today, CBC and BMP stable.   ROS- limited by cognition /language   Objective:   No results found. Recent Labs    11/21/18 0615  WBC 8.3  HGB 8.6*  HCT 28.1*  PLT 576*   Recent Labs    11/21/18 0615  NA 141  K 4.1  CL 110  CO2 21*  GLUCOSE 123*  BUN 17  CREATININE 0.86  CALCIUM 9.2    Intake/Output Summary (Last 24 hours) at 11/21/2018 2951 Last data filed at 11/21/2018 0734 Gross per 24 hour  Intake 556 ml  Output -  Net 556 ml     Physical Exam: Vital Signs Blood pressure 120/62, pulse 64, temperature 97.9 F (36.6 C), temperature source Oral, resp. rate 16, height 5' 1.42" (1.56 m), weight 59.4 kg, SpO2 97 %.   General: No acute distress Mood and affect are appropriate Heart: Regular rate and rhythm no rubs murmurs or extra sounds Lungs: Clear to auscultation, breathing unlabored, no rales or wheezes Abdomen: Positive bowel sounds, soft nontender to palpation, nondistended Extremities: No clubbing, cyanosis, or edema Skin: No evidence of breakdown, no evidence of rash Neurologic: Cranial nerves II through XII intact, motor strength is 5/5 in bilateral deltoid, bicep, tricep, grip, hip flexor, knee extensors, ankle dorsiflexor and plantar flexor Sensory exam normal sensation to light touch and proprioception in bilateral upper and lower extremities Cerebellar exam normal finger to nose to finger as well as heel to shin in bilateral upper and lower extremities Musculoskeletal: Full range of motion in all 4 extremities. No joint swelling  Assessment/Plan: 1. Functional deficits secondary to Cerebellar infarct which require 3+ hours per day of interdisciplinary therapy in a comprehensive inpatient rehab setting.  Physiatrist is providing close team supervision and 24 hour management of  active medical problems listed below.  Physiatrist and rehab team continue to assess barriers to discharge/monitor patient progress toward functional and medical goals  Care Tool:  Bathing  Bathing activity did not occur: Refused Body parts bathed by patient: Right arm, Left arm, Chest, Abdomen, Front perineal area, Right upper leg, Left upper leg, Right lower leg, Left lower leg, Face   Body parts bathed by helper: Buttocks     Bathing assist Assist Level: Minimal Assistance - Patient > 75%     Upper Body Dressing/Undressing Upper body dressing   What is the patient wearing?: Pull over shirt    Upper body assist Assist Level: Contact Guard/Touching assist    Lower Body Dressing/Undressing Lower body dressing    Lower body dressing activity did not occur: N/A(patient is continent, no incontinent brief on) What is the patient wearing?: Underwear/pull up, Pants     Lower body assist Assist for lower body dressing: Supervision/Verbal cueing     Toileting Toileting    Toileting assist Assist for toileting: Supervision/Verbal cueing     Transfers Chair/bed transfer  Transfers assist  Chair/bed transfer activity did not occur: Safety/medical concerns  Chair/bed transfer assist level: Contact Guard/Touching assist     Locomotion Ambulation   Ambulation assist      Assist level: Minimal Assistance - Patient > 75% Assistive device: No Device Max distance: 283ft   Walk 10 feet activity   Assist     Assist level: Minimal Assistance - Patient > 75% Assistive device: No Device   Walk 50 feet  activity   Assist    Assist level: Minimal Assistance - Patient > 75% Assistive device: No Device    Walk 150 feet activity   Assist Walk 150 feet activity did not occur: Safety/medical concerns  Assist level: Minimal Assistance - Patient > 75% Assistive device: No Device    Walk 10 feet on uneven surface  activity   Assist Walk 10 feet on uneven  surfaces activity did not occur: Safety/medical concerns         Wheelchair     Assist Will patient use wheelchair at discharge?: No Type of Wheelchair: Manual    Wheelchair assist level: Minimal Assistance - Patient > 75%(and cues to attend to L) Max wheelchair distance: 40    Wheelchair 50 feet with 2 turns activity    Assist    Wheelchair 50 feet with 2 turns activity did not occur: Safety/medical concerns       Wheelchair 150 feet activity     Assist  Wheelchair 150 feet activity did not occur: Safety/medical concerns       Blood pressure 120/62, pulse 64, temperature 97.9 F (36.6 C), temperature source Oral, resp. rate 16, height 5' 1.42" (1.56 m), weight 59.4 kg, SpO2 97 %.  Medical Problem List and Plan: 1.  Impaired cognition and ADLs secondary to cerebellar infarction -3 hours of PT/OT/SLP at least 5 days per week Poor safety awareness, fall risk ELOS ?11/11 2.  Antithrombotics: -DVT/anticoagulation:  Pharmaceutical: Other (comment)--Eliquis             -antiplatelet therapy: N/A 3. Pain Management: Tylenol prn for HA/knee pain.  4. Mood: LCSW to follow for evaluation and support when appropriate.              -antipsychotic agents: N/A 5. Neuropsych: This patient is not fully capable of making decisions on her own behalf. 6. Skin/Wound Care: Routine pressure relief measures.  7. Fluids/Electrolytes/Nutrition: Monitor I/O. Does not like dysphagia diet--family supplementing from home per reports.  Lytes stable 11/9.  8. E coli bacteremia/pyelonephritis: Completed antibiotic course on 11/2 9. Pulmonary edema with ARF: 10 A fib with RVR: HR controlled on  11.  Asymptomatic bradycardia: Improving with decrease in metoprolol to 25 mg on 11/02. 12. HTN:  Monitor BP bid--continue Metoprolol.  Today's Vitals   11/20/18 2300 11/21/18 0300 11/21/18 0514 11/21/18 0808  BP:   120/62   Pulse:   64   Resp:   16   Temp:   97.9 F (36.6 C)   TempSrc:    Oral   SpO2:   97%   Weight:   59.4 kg   Height:      PainSc: Asleep Asleep  0-No pain  Controlled 11/8  Body mass index is 24.41 kg/m. 13. T2DM: Hgb A1c-8.0.  Monitor BS ac/hs. Was on Janumet PTA. AKI has resolved--resume [place] today and metformin tomorrow if lytes sable and wean off Lantus..  CBG (last 3)  Recent Labs    11/20/18 1659 11/20/18 2104 11/21/18 0625  GLUCAP 249* 124* 124*  Controlled 11/8 14. Leucocytosis: WBC 15.4-->8.4-->14.3--> 10.8  trending down--monitor for signs of infection.Completed  Keflex 500mg  for UTI.  15. Anemia: Folic acid/B12 WNL. Will monitor H/H and watch for signs of bleeding. 11.0-->8.5-->8.6, check stool OB , CBC with Hgb 8.6, stable.  16. Thrombocytosis: Likely reactive. Stable value 17. Constipation: Bowel regimen with Docusate 100mg  BID PRN and Senna 8.6mg  BID SCH.    LOS: 6 days A FACE TO FACE EVALUATION WAS PERFORMED  Clint Bolder P Paulkar 11/21/2018, 8:12 AM

## 2018-11-21 NOTE — Progress Notes (Signed)
Physical Therapy Discharge Summary  Patient Details  Name: Shireen Rayburn MRN: [id number] Date of Birth: February 16, 1950  Today's Date: 11/21/2018 PT Individual Time: 1415-1510 PT Individual Time Calculation (min): 55 min  and Today's Date: 11/21/2018 PT Missed Time: 20 Minutes Missed Time Reason: Patient fatigue   Patient has met 7 of 7 long term goals due to improved activity tolerance, improved balance, improved postural control and increased strength.  Patient to discharge at an ambulatory level Supervision.   Patient's care partner is independent to provide the necessary cognitive assistance at discharge. Pt's daughter attended therapy session and therapist provided education throughout on pt's mobility level and impairments.   All goals met.   Recommendation:  Patient will benefit from ongoing skilled PT services in outpatient setting to continue to advance safe functional mobility, address ongoing impairments in vestibular system, balance, strength, and minimize fall risk.  Equipment: RW  Reasons for discharge: treatment goals met  Patient/family agrees with progress made and goals achieved: Yes   PT Treatment Interventions: Pt supine in bed upon PT arrival, agreeable to therapy tx and denies pain. Interpreter present throughout the session. Pt transferred to sitting independently. Pt ambulated throughout the session with RW and supervision up to 200 ft, ambulated without AD with CGA up to 200 ft. Pt performed car transfer this session with supervision, ambulated on unlevel surfaces with supervision. Pt ambulated to the gym and ascended/descended 12 steps with B rails and supervision, reciprocal pattern. Pt participated in berg balance test as detailed below, scored 40/56. During the 360 turn task pt cautious to perform because of dizziness, pt with increased dizziness. Pt and her family originally declining follow up therapy however this therapist educated them on the importance of going  to follow up therapy in order to address balance and vestibular deficits. Pt and family receptive to education and agreeable. Pt requesting to go back to the room secondary to fatigue, ambulated back to room and left supine in bed with needs in reach and bed alarm set. Pt missed 20 minutes of skilled therapy tx this session secondary to fatigue.  PT Discharge Precautions/Restrictions Precautions Precautions: Fall Restrictions Weight Bearing Restrictions: No Cognition Overall Cognitive Status: Impaired/Different from baseline Arousal/Alertness: Awake/alert Orientation Level: Oriented X4 Attention: Focused;Sustained Focused Attention: Appears intact Sustained Attention: Appears intact Selective Attention: Appears intact Memory: Appears intact Problem Solving: Impaired Problem Solving Impairment: Functional complex;Functional basic Safety/Judgment: Impaired Sensation Sensation Light Touch: Appears Intact Proprioception: Appears Intact Additional Comments: sensation appears intact B LEs Coordination Gross Motor Movements are Fluid and Coordinated: No Fine Motor Movements are Fluid and Coordinated: Yes Coordination and Movement Description: impaired 2/2 generalized weakness and balance deficits Heel Shin Test: slower R LE compaared to L Motor  Motor Motor - Discharge Observations: difficulty with alternating UB coordination activities, left side with decreased rhythmic coordination, difficulty initiating activities with left  Mobility Bed Mobility Bed Mobility: Rolling Right;Rolling Left;Supine to Sit;Sit to Supine Rolling Right: Independent Rolling Left: Independent Supine to Sit: Independent Sit to Supine: Independent Transfers Transfers: Sit to Stand;Stand Pivot Transfers Sit to Stand: Supervision/Verbal cueing Stand Pivot Transfers: Supervision/Verbal cueing Transfer (Assistive device): Rolling walker Locomotion  Gait Ambulation: Yes Gait Assistance: Supervision/Verbal  cueing Gait Distance (Feet): 150 Feet Assistive device: Rolling walker Gait Assistance Details: Verbal cues for precautions/safety Gait Gait: Yes Gait Pattern: Impaired Gait velocity: decreased Stairs / Additional Locomotion Stairs: Yes Stairs Assistance: Supervision/Verbal cueing Stair Management Technique: Two rails Number of Stairs: 12 Height of Stairs: 6 Curb: Supervision/Verbal cueing  Wheelchair Mobility Wheelchair Mobility: No  Trunk/Postural Assessment  Cervical Assessment Cervical Assessment: Within Functional Limits Thoracic Assessment Thoracic Assessment: Within Functional Limits Lumbar Assessment Lumbar Assessment: Within Functional Limits Postural Control Postural Control: Within Functional Limits  Balance Standardized Balance Assessment Standardized Balance Assessment: Berg Balance Test Berg Balance Test Sit to Stand: Able to stand without using hands and stabilize independently Standing Unsupported: Able to stand safely 2 minutes Sitting with Back Unsupported but Feet Supported on Floor or Stool: Able to sit safely and securely 2 minutes Stand to Sit: Sits safely with minimal use of hands Transfers: Able to transfer safely, minor use of hands Standing Unsupported with Eyes Closed: Able to stand 10 seconds with supervision Standing Ubsupported with Feet Together: Able to place feet together independently and stand for 1 minute with supervision From Standing, Reach Forward with Outstretched Arm: Reaches forward but needs supervision From Standing Position, Pick up Object from Floor: Able to pick up shoe, needs supervision From Standing Position, Turn to Look Behind Over each Shoulder: Looks behind from both sides and weight shifts well Turn 360 Degrees: Able to turn 360 degrees safely but slowly Standing Unsupported, Alternately Place Feet on Step/Stool: Able to stand independently and complete 8 steps >20 seconds Standing Unsupported, One Foot in Front: Loses  balance while stepping or standing Standing on One Leg: Tries to lift leg/unable to hold 3 seconds but remains standing independently Total Score: 40 Extremity Assessment  RLE Assessment RLE Assessment: Within Functional Limits LLE Assessment LLE Assessment: Within Functional Limits    Netta Corrigan, PT, DPT, CSRS 11/21/2018, 4:48 PM

## 2018-11-21 NOTE — Plan of Care (Signed)
  Problem: Consults Goal: RH STROKE PATIENT EDUCATION Description: See Patient Education module for education specifics  Outcome: Progressing Goal: Diabetes Guidelines if Diabetic/Glucose > 140 Description: If diabetic or lab glucose is > 140 mg/dl - Initiate Diabetes/Hyperglycemia Guidelines & Document Interventions  Outcome: Progressing   Problem: RH SKIN INTEGRITY Goal: RH STG SKIN FREE OF INFECTION/BREAKDOWN Description: Free of infection and skin breakdown, mod I Outcome: Progressing   Problem: RH SAFETY Goal: RH STG ADHERE TO SAFETY PRECAUTIONS W/ASSISTANCE/DEVICE Description: STG Adhere to Safety Precautions With Assistance/Device. Min assist Outcome: Progressing   Problem: RH KNOWLEDGE DEFICIT Goal: RH STG INCREASE KNOWLEDGE OF DIABETES Description: Patient/Family will be able to describe management of T2DM with cues/handouts Outcome: Progressing Goal: RH STG INCREASE KNOWLEDGE OF HYPERTENSION Description: Patient/family will be able to verbalize management of hypertension with cues/handouts Outcome: Progressing Goal: RH STG INCREASE KNOWLEDGE OF STROKE PROPHYLAXIS Description: Patient/family will be able to describe prophylaxis to prevent stroke with cues/handouts Outcome: Progressing   Problem: Consults Goal: Nutrition Consult-if indicated Outcome: Completed/Met   Problem: RH BOWEL ELIMINATION Goal: RH STG MANAGE BOWEL WITH ASSISTANCE Description: STG Manage Bowel with Assistance. Mod I Outcome: Completed/Met   Problem: RH BLADDER ELIMINATION Goal: RH STG MANAGE BLADDER WITH ASSISTANCE Description: STG Manage Bladder With Assistance Mod I Outcome: Completed/Met   Problem: RH PAIN MANAGEMENT Goal: RH STG PAIN MANAGED AT OR BELOW PT'S PAIN GOAL Description: Less than 3 Outcome: Completed/Met

## 2018-11-21 NOTE — Progress Notes (Signed)
Occupational Therapy Discharge Summary  Patient Details  Name: Aimee Parker MRN: [id number] Date of Birth: 1951-01-12  Today's Date: 11/21/2018 OT Individual Time: 0900-1000 OT Individual Time Calculation (min): 60 min    Patient has met 8 of 8 long term goals due to improved activity tolerance, improved balance, postural control, improved attention and improved coordination.  Patient to discharge at overall Supervision level.  Patient's care partner is independent to provide the necessary physical assistance at discharge.    Reasons goals not met: na  Recommendation:  Patient will benefit from ongoing skilled OT services in outpatient setting to continue to advance functional skills in the area of BADL.  Equipment: commode and shower seat  Reasons for discharge: treatment goals met  Patient/family agrees with progress made and goals achieved: Yes  OT Discharge Precautions/Restrictions  Precautions Precautions: Fall General  patient in bed, alert and ready for therapy session.   Interpreter present for therapy session.  Reassessment for discharge completed as documented below.  Reviewed safety in home environment and strategies to improve coordination and balance.  She notes that her left arm continues to feel weak.  Completed [place] activities seated and in stance with CS, ambulation with RW on unit with CS.  She returned to her room, seated in bed, with bed alarm set.  She states that she is pleased with her progress and feels prepared for discharge to home tomorrow with family supervision and support.     Vital Signs   Pain Pain Assessment Pain Scale: 0-10 Pain Score: 0-No pain ADL Supervision level for bathing, dressing, grooming and toileting tasks Vision Wears Glasses: Reading only Patient Visual Report: No change from baseline Additional Comments: patient scans environment during functional task without cues Cognition Overall Cognitive Status: Impaired/Different from  baseline Arousal/Alertness: Awake/alert Orientation Level: Oriented X4 Attention: Focused;Sustained Focused Attention: Appears intact Sustained Attention: Appears intact Selective Attention: Appears intact Memory: Appears intact Awareness: Impaired Awareness Impairment: Emergent impairment Problem Solving: Impaired Problem Solving Impairment: Functional complex;Functional basic Safety/Judgment: Impaired Sensation Sensation Light Touch: Appears Intact Coordination Finger Nose Finger Test: mild dysmetria left 9 Hole Peg Test: R = 34 sec, L = 32 sec, box and blocks:  R = 42, L = 41 Motor  Motor Motor - Discharge Observations: difficulty with alternating UB coordination activities, left side with decreased rhythmic coordination, difficulty initiating activities with left Mobility  Bed Mobility Rolling Right: Independent Rolling Left: Independent Supine to Sit: Independent Sit to Supine: Independent Transfers Sit to Stand: Supervision/Verbal cueing  Trunk/Postural Assessment  Cervical Assessment Cervical Assessment: Within Functional Limits Thoracic Assessment Thoracic Assessment: Within Functional Limits Lumbar Assessment Lumbar Assessment: Within Functional Limits  Balance Static Sitting Balance Static Sitting - Level of Assistance: 7: Independent Dynamic Sitting Balance Dynamic Sitting - Level of Assistance: 6: Modified independent (Device/Increase time) Static Standing Balance Static Standing - Level of Assistance: 5: Stand by assistance Dynamic Standing Balance Dynamic Standing - Level of Assistance: 5: Stand by assistance Extremity/Trunk Assessment RUE Assessment RUE Assessment: Within Functional Limits LUE Assessment LUE Assessment: Within Functional Limits General Strength Comments: mild dysmetria   Carlos Levering 11/21/2018, 12:36 PM

## 2018-11-22 LAB — GLUCOSE, CAPILLARY
Glucose-Capillary: 137 mg/dL — ABNORMAL HIGH (ref 70–99)
Glucose-Capillary: 133 mg/dL — ABNORMAL HIGH (ref 70–99)
Glucose-Capillary: 100 mg/dL — ABNORMAL HIGH (ref 70–99)

## 2018-11-22 MED ORDER — GLUCOSE BLOOD VI STRP: ORAL_STRIP | 3 refills | Status: DC

## 2018-11-22 MED ORDER — ASPIRIN EC 81 MG PO TBEC: 81.0000 mg | DELAYED_RELEASE_TABLET | Freq: Every day | ORAL | 0 refills | Status: DC

## 2018-11-22 MED ORDER — APIXABAN 5 MG PO TABS: 5.0000 mg | ORAL_TABLET | Freq: Two times a day (BID) | ORAL | 0 refills | Status: DC

## 2018-11-22 MED ORDER — ACETAMINOPHEN 325 MG PO TABS: 325.0000 mg | ORAL_TABLET | ORAL | Status: AC | PRN

## 2018-11-22 MED ORDER — SENNA 8.6 MG PO TABS: 1.0000 | ORAL_TABLET | Freq: Two times a day (BID) | ORAL | 0 refills | Status: DC

## 2018-11-22 MED ORDER — METOPROLOL TARTRATE 25 MG PO TABS: 25.0000 mg | ORAL_TABLET | Freq: Two times a day (BID) | ORAL | 0 refills | Status: DC

## 2018-11-22 MED ORDER — LINAGLIPTIN 5 MG PO TABS: 5.0000 mg | ORAL_TABLET | Freq: Every day | ORAL | 0 refills | Status: DC

## 2018-11-22 MED ORDER — ATORVASTATIN CALCIUM 40 MG PO TABS: 40.0000 mg | ORAL_TABLET | Freq: Every day | ORAL | 0 refills | Status: DC

## 2018-11-22 MED ORDER — INSULIN GLARGINE 100 UNIT/ML ~~LOC~~ SOLN: 10.0000 [IU] | SUBCUTANEOUS | 11 refills | Status: DC

## 2018-11-22 MED ORDER — INSULIN SYRINGES (DISPOSABLE) U-100 0.5 ML MISC: 1.0000 "application " | Freq: Every day | 0 refills | Status: DC

## 2018-11-22 MED ORDER — BENZONATATE 100 MG PO CAPS: 100.0000 mg | ORAL_CAPSULE | Freq: Three times a day (TID) | ORAL | 0 refills | Status: DC | PRN

## 2018-11-22 MED FILL — BENZONATATE 100 MG CAPS: 100 | 10 days supply | Qty: 30 | Fill #0

## 2018-11-22 MED FILL — ACCU-CHEK AVIVA PLUS STRP: 25 days supply | Qty: 100 | Fill #0

## 2018-11-22 MED FILL — TRADJENTA 5 MG TABLET: 5 | 30 days supply | Qty: 30 | Fill #0

## 2018-11-22 MED FILL — ELIQUIS 5 MG TABLET: 5 | 30 days supply | Qty: 60 | Fill #0

## 2018-11-22 MED FILL — METOPROLOL TARTRATE 25 MG T: 25 | 30 days supply | Qty: 60 | Fill #0

## 2018-11-22 MED FILL — TRUEPLUS SYR 0.5ML 30GX5/16: 30G X 5/16" | 30 days supply | Qty: 100 | Fill #0

## 2018-11-22 NOTE — Progress Notes (Signed)
[facility] PHYSICAL MEDICINE & REHABILITATION PROGRESS NOTE   Subjective/Complaints:    ROS- limited by cognition /language   Objective:   No results found. Recent Labs    11/21/18 0615  WBC 8.3  HGB 8.6*  HCT 28.1*  PLT 576*   Recent Labs    11/21/18 0615  NA 141  K 4.1  CL 110  CO2 21*  GLUCOSE 123*  BUN 17  CREATININE 0.86  CALCIUM 9.2    Intake/Output Summary (Last 24 hours) at 11/22/2018 0731 Last data filed at 11/21/2018 1757 Gross per 24 hour  Intake 617 ml  Output -  Net 617 ml     Physical Exam: Vital Signs Blood pressure (!) 136/52, pulse 66, temperature 98.6 F (37 C), resp. rate 18, height 5' 1.42" (1.56 m), weight 59.4 kg, SpO2 95 %.   General: No acute distress Mood and affect are appropriate Heart: Regular rate and rhythm no rubs murmurs or extra sounds Lungs: Clear to auscultation, breathing unlabored, no rales or wheezes Abdomen: Positive bowel sounds, soft nontender to palpation, nondistended Extremities: No clubbing, cyanosis, or edema Skin: No evidence of breakdown, no evidence of rash Neurologic: Cranial nerves II through XII intact, motor strength is 5/5 in bilateral deltoid, bicep, tricep, grip, hip flexor, knee extensors, ankle dorsiflexor and plantar flexor Sensory exam normal sensation to light touch and proprioception in bilateral upper and lower extremities Cerebellar exam normal finger to nose to finger as well as heel to shin in bilateral upper and lower extremities Musculoskeletal: Full range of motion in all 4 extremities. No joint swelling  Assessment/Plan: 1. Functional deficits secondary to Cerebellar infarct  Stable for D/C today F/u PCP in 3-4 weeks F/u PM&R 2 weeks See D/C summary See D/C instructions Care Tool:  Bathing  Bathing activity did not occur: Refused Body parts bathed by patient: Right arm, Left arm, Chest, Abdomen, Front perineal area, Right upper leg, Left upper leg, Right lower leg, Left lower  leg, Face, Buttocks   Body parts bathed by helper: Buttocks     Bathing assist Assist Level: Supervision/Verbal cueing     Upper Body Dressing/Undressing Upper body dressing   What is the patient wearing?: Button up shirt    Upper body assist Assist Level: Set up assist    Lower Body Dressing/Undressing Lower body dressing    Lower body dressing activity did not occur: N/A(patient is continent, no incontinent brief on) What is the patient wearing?: Underwear/pull up, Pants     Lower body assist Assist for lower body dressing: Supervision/Verbal cueing     Toileting Toileting    Toileting assist Assist for toileting: Supervision/Verbal cueing     Transfers Chair/bed transfer  Transfers assist  Chair/bed transfer activity did not occur: Safety/medical concerns  Chair/bed transfer assist level: Supervision/Verbal cueing     Locomotion Ambulation   Ambulation assist      Assist level: Supervision/Verbal cueing Assistive device: Walker-rolling Max distance: 237ft   Walk 10 feet activity   Assist     Assist level: Supervision/Verbal cueing Assistive device: Walker-rolling   Walk 50 feet activity   Assist    Assist level: Supervision/Verbal cueing Assistive device: Walker-rolling    Walk 150 feet activity   Assist Walk 150 feet activity did not occur: Safety/medical concerns  Assist level: Supervision/Verbal cueing Assistive device: Walker-rolling    Walk 10 feet on uneven surface  activity   Assist Walk 10 feet on uneven surfaces activity did not occur: Safety/medical concerns  Assist level: Supervision/Verbal cueing Assistive device: Aeronautical engineer Will patient use wheelchair at discharge?: No Type of Wheelchair: Manual    Wheelchair assist level: Minimal Assistance - Patient > 75%(and cues to attend to L) Max wheelchair distance: 40    Wheelchair 50 feet with 2 turns activity    Assist     Wheelchair 50 feet with 2 turns activity did not occur: Safety/medical concerns       Wheelchair 150 feet activity     Assist  Wheelchair 150 feet activity did not occur: Safety/medical concerns       Blood pressure (!) 136/52, pulse 66, temperature 98.6 F (37 C), resp. rate 18, height 5' 1.42" (1.56 m), weight 59.4 kg, SpO2 95 %.  Medical Problem List and Plan: 1.  Impaired cognition and ADLs secondary to cerebellar infarction -3 hours of PT/OT/SLP at least 5 days per week Poor safety awareness, fall risk D/c today  2.  Antithrombotics: -DVT/anticoagulation:  Pharmaceutical: Other (comment)--Eliquis             -antiplatelet therapy: N/A 3. Pain Management: Tylenol prn for HA/knee pain.  4. Mood: LCSW to follow for evaluation and support when appropriate.              -antipsychotic agents: N/A 5. Neuropsych: This patient is not fully capable of making decisions on her own behalf. 6. Skin/Wound Care: Routine pressure relief measures.  7. Fluids/Electrolytes/Nutrition: Monitor I/O. Does not like dysphagia diet--family supplementing from home per reports.  Check lytes in am. 8. E coli bacteremia/pyelonephritis: Completed antibiotic course on 11/2 9. Pulmonary edema with ARF: 10 A fib with RVR: HR controlled on  11.  Asymptomatic bradycardia: Improving with decrease in metoprolol to 25 mg on 11/02. 12. HTN:  Monitor BP bid--continue Metoprolol.  Today's Vitals   11/21/18 1225 11/21/18 1231 11/21/18 1945 11/22/18 0356  BP:   (!) 161/50 (!) 136/52  Pulse:   67 66  Resp:   15 18  Temp:   98.2 F (36.8 C) 98.6 F (37 C)  TempSrc:   Oral   SpO2:   96% 95%  Weight:      Height:      PainSc: 0-No pain 0-No pain 0-No pain   Controlled 11/10 Body mass index is 24.41 kg/m. 13. T2DM: Hgb A1c-8.0.  Monitor BS ac/hs. Was on Janumet PTA. AKI has resolved--resume [place] today and metformin tomorrow if lytes sable and wean off Lantus..  CBG (last 3)  Recent Labs     11/21/18 1646 11/21/18 2123 11/22/18 0624  GLUCAP 172* 132* 100*  Controlled 11/10 14. Leucocytosis: WBC 15.4-->8.4-->14.3--> 10.8  trending down--monitor for signs of infection.Completed  Keflex 500mg  for UTI.  15. Anemia: Folic HENI/D78 WNL. Will monitor H/H and watch for signs of bleeding. 11.0-->8.5-->8.6, check stool OB , repeat hgb 8.8- stable  16. Thrombocytosis: 576K 17. Constipation: Bowel regimen with Docusate 100mg  BID PRN and Senna 8.6mg  BID [place].    LOS: 7 days A FACE TO FACE EVALUATION WAS PERFORMED  Charlett Blake 11/22/2018, 7:31 AM

## 2018-11-22 NOTE — Progress Notes (Signed)
Patient discharged. PA provided education to patient, daughter and grandson prior to departure. No questions or concerns at this time.

## 2018-11-22 NOTE — Discharge Summary (Signed)
Physician Discharge Summary  Patient ID: Aimee Parker MRN: [id number] DOB/AGE: Aug 17, 1950 68 y.o.  Admit date: 11/15/2018 Discharge date: 11/22/2018  Discharge Diagnoses:  Principal Problem:   Embolic stroke [facility]) Active Problems:   Essential hypertension, benign   Cough   Type 2 diabetes mellitus without complication, with long-term current use of insulin [facility])   Discharged Condition: stable  Significant Diagnostic Studies: N/A  BMET: BMP Latest Ref Rng & Units 11/21/2018 11/16/2018 11/15/2018  Glucose 70 - 99 mg/dL 123(H) 97 128(H)  BUN 8 - 23 mg/dL _0 Creatinine 0.44 - 1.00 mg/dL 0.86 0.71 0.72  BUN/Creat Ratio 12 - 28 - - -  Sodium 135 - 145 mmol/L 141 140 137  Potassium 3.5 - 5.1 mmol/L 4.1 4.0 4.1  Chloride 98 - 111 mmol/L 110 110 104  CO2 22 - 32 mmol/L 21(L) 21(L) 23  Calcium 8.9 - 10.3 mg/dL 9.2 8.3(L) 8.5(L)    CBC: CBC Latest Ref Rng & Units 11/21/2018 11/16/2018 11/15/2018  WBC 4.0 - 10.5 K/uL 8.3 10.8(H) 14.3(H)  Hemoglobin 12.0 - 15.0 g/dL 8.6(L) 8.6(L) 8.5(L)  Hematocrit 36.0 - 46.0 % 28.1(L) 28.0(L) 27.9(L)  Platelets 150 - 400 K/uL 576(H) 626(H) 634(H)    CBG: Recent Labs  Lab 11/21/18 0625 11/21/18 1209 11/21/18 1646 11/21/18 2123 11/22/18 0624  GLUCAP 124* 102* 172* 132* 100*    Brief HPI:   Aimee Parker is a 68 y.o. female with history of T2DM, HTN, vitamin D deficiency who was admitted on 11/01/2018 with septic shock from E. coli bacteremia and increased WOB requiring intubation.  She was treated with IV fluids for AKI as well as IV antibiotics.  ID recommended 10-day course of antibiotics with 10/23 is day 1.  On 10/25 she was found to have decrease in LOC with diffuse weakness and inability to follow commands.  CT of head done revealing hypodensity in left posterior cerebellum's and extensive low-density in body and splenium of corpus callosum.  MRI brain showed restricted diffusion with swelling and edema within colossal body and  splenium, left cingulate gyrus, left globus pallidus, acute ischemic infarct left cerebellum and bilateral mastoid effusions.  TEE done revealing EF of 55 to 60% and no thrombus, mass or PFO/ASD.    Dr. Leonie Man felt the stroke was embolic due to sepsis versus paradoxical embolism.  She did develop bursts of SVT with AF on 10/28 and converted to normal saline on amiodarone.  She was started on IV heparin and transition to Eliquis.  She tolerated extubation on 10/29 and post extubation stridor treated with steroids as well as epinephrine.  Swallow evaluation done and she was started on dysphagia 1 with nectar liquids.  She continued to be limited by dysphonia with DOE, fatigue, RLE weakness and right body inattention.  CIR was recommended due to functional decline   Hospital Course: Aimee Parker was admitted to rehab 11/15/2018 for inpatient therapies to consist of PT, ST and OT at least three hours five days a week. Past admission physiatrist, therapy team and rehab RN have worked together to provide customized collaborative inpatient rehab. Blood pressures and heart rate were monitored on TID basis and she is tolerating metoprolol  25 mg bid without recurrent bradycardia.  Her blood sugars have been monitored with ac/hs CBG checks and blood sugars are reasonably controlled.  Januvia was resumed past admission and she continues on Lantus 10 units daily.  She is to follow-up with PCP for further adjustment in diabetic regimen.  She continues on Eliquis with low dose ASA for secondary stroke prevention and H/H is relatively stable without signs of bleeding. Reactive leucocytosis has resolved and she has been afebrile during her stay. Recommend follow up CBC in next 1-2 weeks to monitor for recovery.  Respiratory status is stable and cough has improved with use of Tessalon perles tid. She is continent of bowel and bladder. Po intake has been good with family supplementing with food from home. Diet has been advanced  to dysphagia 2, thins.   She continues to have decrease in fine motor movement LUE with mild dysmetria and balance deficits due to vestibular issues.  Family has been very supportive and have been educated in caregiving.  She has made gains during rehab stay and is currently at supervision level. She will continue to receive follow up outpatient PT, OT and ST at [facility] neuro rehab    after discharge  Rehab course: During patient's stay in rehab weekly team conferences were held to monitor patient's progress, set goals and discuss barriers to discharge. At admission, patient required mod assist with mobility and min care tasks.  She presented with mild to moderate aspiration risk and showed mild cognitive deficits with delayed processing and decrease in verbal output.  She  has had improvement in activity tolerance, balance, postural control as well as ability to compensate for deficits.  She is able to complete ADL tasks with supervision.  She requires supervision for transfers and to ambulate 220' with RW., BERG balance score has improved to 40/56. She is able to climb 12 stairs with supervision and cues for safety. .     Disposition: Home  Diet: Dysphagia 2 diet.  Carb modified/heart healthy  Special Instructions: 1.  Monitor blood sugars AC/HS and follow-up with PCP for further adjustment in medication. 2.  Encourage fluid intake.  3.  Recommend repeat CBC in a couple weeks to monitor for stability/improvement.   Discharge Instructions    Ambulatory referral to Physical Medicine Rehab   Complete by: As directed    1-2 weeks transitional care appt/needs interpreter.     Allergies as of 11/22/2018   No Known Allergies     Medication List    STOP taking these medications   Accu-Chek Aviva Plus w/Device Kit   albuterol (2.5 MG/3ML) 0.083% nebulizer solution Commonly known as: PROVENTIL   aspirin 81 MG chewable tablet Replaced by: aspirin EC 81 MG tablet   insulin aspart 100 UNIT/ML  injection Commonly known as: novoLOG     TAKE these medications   Accu-Chek Softclix Lancets lancets Use as instructed. Check blood glucose levels twice per day by fingerstick   acetaminophen 325 MG tablet Commonly known as: TYLENOL Take 1-2 tablets (325-650 mg total) by mouth every 4 (four) hours as needed for mild pain.   apixaban 5 MG Tabs tablet Commonly known as: ELIQUIS Take 1 tablet (5 mg total) by mouth 2 (two) times daily.   aspirin EC 81 MG tablet Take 1 tablet (81 mg total) by mouth daily. Replaces: aspirin 81 MG chewable tablet   atorvastatin 40 MG tablet Commonly known as: LIPITOR Take 1 tablet (40 mg total) by mouth daily.   benzonatate 100 MG capsule Commonly known as: TESSALON Take 1 capsule (100 mg total) by mouth 3 (three) times daily as needed for cough.   glucose blood test strip Commonly known as: Accu-Chek Aviva Use as instructed. Check blood glucose levels before meals and at bedtime--by sticking your finger. What changed:  additional instructions  Another medication with the same name was removed. Continue taking this medication, and follow the directions you see here.   insulin glargine 100 UNIT/ML injection Commonly known as: LANTUS Inject 0.1 mLs (10 Units total) into the skin daily. What changed: how much to take   Insulin Syringes (Disposable) U-100 0.5 ML Misc 1 application by Does not apply route daily.   linagliptin 5 MG Tabs tablet Commonly known as: TRADJENTA Take 1 tablet (5 mg total) by mouth daily.   metoprolol tartrate 25 MG tablet Commonly known as: LOPRESSOR Take 1 tablet (25 mg total) by mouth 2 (two) times daily.   senna 8.6 MG Tabs tablet Commonly known as: SENOKOT Take 1 tablet (8.6 mg total) by mouth 2 (two) times daily.   Vitamin D (Ergocalciferol) 1.25 MG (50000 UT) Caps capsule Commonly known as: DRISDOL Take 1 capsule (50,000 Units total) by mouth every 7 (seven) days.      Follow-up Information     Kirsteins, Luanna Salk, MD Follow up.   Specialty: Physical Medicine and Rehabilitation Why: Office will call you with follow up appointment Contact information: [place] [address] [phone]        Gildardo Pounds, NP. Call on 11/23/2018.   Specialty: Nurse Practitioner Why: for post hospital follow up Contact information: [place] [address] [phone]        [place]. Call on 11/23/2018.   Why: for stroke follow up Contact information: [address]     [place] [id number] [phone]          Signed: Bary Leriche 11/23/2018, 5:29 PM

## 2018-11-23 ENCOUNTER — Telehealth: Payer: Self-pay

## 2018-11-23 ENCOUNTER — Ambulatory Visit: Payer: Medicare Other | Admitting: Physical Therapy

## 2018-11-23 ENCOUNTER — Other Ambulatory Visit: Payer: Self-pay | Admitting: Pharmacist

## 2018-11-23 ENCOUNTER — Encounter: Payer: Medicare Other | Admitting: Occupational Therapy

## 2018-11-23 ENCOUNTER — Ambulatory Visit: Payer: Medicare Other

## 2018-11-23 DIAGNOSIS — E119 Type 2 diabetes mellitus without complications: Secondary | ICD-10-CM

## 2018-11-23 MED ORDER — ACCU-CHEK AVIVA DEVI: 0 refills | Status: AC

## 2018-11-23 NOTE — Telephone Encounter (Signed)
Transition Care Management Follow-up Telephone Call  [place] interpreter # [phone] with [facility] [employer] assisted.  Patient confirmed that the best number to reach her is # [phone]   Date of discharge and from where: 11/22/2018, [place]  How have you been since you were released from the hospital? She stated " my health is very good."   Any questions or concerns? No questions/concerns except for needing the glucometer.  She denied any signs of bleeding and said she is not having any difficulty eating.    Items Reviewed:  Did the pt receive and understand the discharge instructions provided? Yes, she said the instructions were reviewed as well as the medication list prior to her discharge and she has no questions.   Medications obtained and verified? She said that she has all medications including the new ones and is aware of the changes with her medications as well as those discontinued. Again she said that she understands her medications and did not have any questions or need to review the med list. She also confirmed that she has no problem administering insulin and did not have any questions about the insulin.   Any new allergies since your discharge? None reported   Do you have support at home? Yes, lives with her husband and her children  Other (ie: DME, Home Health, etc) no home health ordered.   She has a RW, 3:1 commode and tub seat.  Her glucometer is broken, Benard Halsted, RPH placed order for new machine for patient.  Order sent to [facility] at patient's request. She said that she know hows to use the machine and has test strips and lancets she just needs the machine   Functional Questionnaire: (I = Independent and D = Dependent) ADL's:independent , family provides needed assistance    Follow up appointments reviewed:    PCP Hospital f/u appt confirmed? Appointment scheduled for 12/26/2018 @ 0930.  She did not want to  schedule an appointment any sooner.   [facility] f/u appt confirmed? She has appointment with rehab on 12/05/2018 as well as outpatient appointments scheduled for PT/OT/ST  Are transportation arrangements needed?no, her children drive  If their condition worsens, is the pt aware to call  their PCP or go to the ED? yes  Was the patient provided with contact information for the PCP's office or ED? Yes, she was given the number for [facility]  Was the pt encouraged to call back with questions or concerns? yes

## 2018-11-23 NOTE — Telephone Encounter (Signed)
Transition Care Management Follow-up Telephone Call Date of discharge and from where: 11/22/2018, [place] placed with assistance of [place] interpreter # [phone] with [place].  Call placed to # [phone] and  # [phone] (Glus-son) , no voicemails set up.  Call placed to An (grandson) who answered and requested this CM call his grandfather who was with his grandmother # [phone].  Call was made to that number provided and there was no voicemail at that number either.

## 2018-11-23 NOTE — Telephone Encounter (Signed)
From discharge call.  Her follow up appointment is not until 12/26/2018 and she did not want to schedule anything sooner.      She said that she has all medications including the new ones and is aware of the changes with her medications as well as those discontinued. she said that she understands her medications and did not have any questions or need to review the med list. She also confirmed that she has no problem administering insulin and did not have any questions about the insulin.    Her glucometer is broken, Aimee Parker, RPH placed order for new machine for patient.  Order sent to [facility] at patient's request. She said that she knows hows to use the machine and has test strips and lancets she just needs the machine

## 2018-11-24 ENCOUNTER — Telehealth: Payer: Self-pay | Admitting: Registered Nurse

## 2018-11-24 NOTE — Telephone Encounter (Signed)
Transitional Care call Transitional Questions answered by [name] Aimee Parker  Patient name: Aimee Parker DOB: [birth year] 1. Are you/is patient experiencing any problems since coming home? No a. Are there any questions regarding any aspect of care? No 2. Are there any questions regarding medications administration/dosing? No a. Are meds being taken as prescribed? Yes b. "Patient should review meds with caller to confirm" Medication List Reviewed 3. Have there been any falls? No 4. Has Home Health been to the house and/or have they contacted you? Going to Outpatient Neuro Rehabilitation appointments scheduled a. If not, have you tried to contact them? NA b. Can we help you contact them? No 5. Are bowels and bladder emptying properly? Yes a. Are there any unexpected incontinence issues? No b. If applicable, is patient following bowel/bladder programs? No 6. Any fevers, problems with breathing, unexpected pain? No 7. Are there any skin problems or new areas of breakdown? No 8. Has the patient/family member arranged specialty MD follow up (ie cardiology/neurology/renal/surgical/etc.)?  Instructed Aimee Parker to schedule HFU appointment with [facility], PCP appointment scheduled.  a. Can we help arrange? NA 9. Does the patient need any other services or support that we can help arrange? No 10. Are caregivers following through as expected in assisting the patient? Yes 11. Has the patient quit smoking, drinking alcohol, or using drugs as recommended? (                        )  Appointment date/time 12/05/2018  arrival time 11:00 for 11:20 appointment with Dr. Tarry Kos. [place]

## 2018-11-27 NOTE — Telephone Encounter (Signed)
NOTED

## 2018-11-28 ENCOUNTER — Ambulatory Visit: Payer: Medicare Other | Attending: Physical Medicine & Rehabilitation

## 2018-11-28 ENCOUNTER — Other Ambulatory Visit: Payer: Self-pay

## 2018-11-28 DIAGNOSIS — R2681 Unsteadiness on feet: Secondary | ICD-10-CM | POA: Insufficient documentation

## 2018-11-28 DIAGNOSIS — I69318 Other symptoms and signs involving cognitive functions following cerebral infarction: Secondary | ICD-10-CM | POA: Insufficient documentation

## 2018-11-28 DIAGNOSIS — M6281 Muscle weakness (generalized): Secondary | ICD-10-CM | POA: Diagnosis present

## 2018-11-28 DIAGNOSIS — R41841 Cognitive communication deficit: Secondary | ICD-10-CM

## 2018-11-28 DIAGNOSIS — R2689 Other abnormalities of gait and mobility: Secondary | ICD-10-CM | POA: Diagnosis present

## 2018-11-28 DIAGNOSIS — R490 Dysphonia: Secondary | ICD-10-CM | POA: Insufficient documentation

## 2018-11-28 DIAGNOSIS — R278 Other lack of coordination: Secondary | ICD-10-CM | POA: Diagnosis present

## 2018-11-29 ENCOUNTER — Telehealth: Payer: Self-pay

## 2018-11-29 NOTE — Therapy (Signed)
[facility] [address] [place], [place], [address] Phone: [phone]   Fax:  [phone]  Speech Language Pathology Evaluation  Patient Details  Name: Aimee Parker MRN: [id number] Date of Birth: January 30, 1950 Referring Provider (SLP): Alysia Penna, MD   Encounter Date: 11/28/2018  End of Session - 11/29/18 1439    Visit Number  1    Number of Visits  17    Date for SLP Re-Evaluation  02/24/19    SLP Start Time  0936    SLP Stop Time   1016    SLP Time Calculation (min)  40 min    Activity Tolerance  Patient tolerated treatment well       Past Medical History:  Diagnosis Date  . Diabetes mellitus without complication ([place])   . Hypertension   . Vitamin D deficiency     Past Surgical History:  Procedure Laterality Date  . EYE SURGERY     Left eye surgery  . PARS PLANA VITRECTOMY Right 05/13/2017   Procedure: PARS PLANA VITRECTOMY 25 GAUGE FOR ENDOPHTHALMITIS;  Surgeon: Hurman Horn, MD;  Location: [facility];  Service: Ophthalmology;  Laterality: Right;    There were no vitals filed for this visit.  Subjective Assessment - 11/28/18 0936    Subjective  Pt arrives with husband.    Patient is accompained by:  Family member   husband        SLP Evaluation [facility] - 11/29/18 0001      SLP Visit Information   SLP Received On  11/28/18    Referring Provider (SLP)  Alysia Penna, MD    Onset Date  11-01-18    Medical Diagnosis  CVAs/sepsis      Subjective   Subjective  Waited 9 minutes for [place] interpreter with [employer] system. Did not begin eval until 0948.    Patient/Family Stated Goal  Improve pt independence/reduce caregiver burden      General Information   HPI  On 11-01-18 pt admitted due to septic shock, was intubated 11-02-18. On 11-06-18 diffuse cortical and subcortical CVA s were ID'd via MRI. Pt was on CIR until 11-22-18. She is receiving 24-7 care.       Prior  Functional Status   Cognitive/Linguistic Baseline  Within functional limits    Type of Home  House     Lives With  Spouse;Family    Vocation  Full time employment      Cognition   Overall Cognitive Status  Impaired/Different from baseline    Area of Impairment  Awareness;Memory    Memory Comments  husband reports he assists pt with meds more than prior to hospitalization    Awareness Comments  pt did not demo emergent awareness of errors on clock drawing (two "2", lines from each number to center, arrow to indicate a "hand" vs. a line on one hand only).    Behaviors  Impulsive      Auditory Comprehension   Overall Auditory Comprehension  Appears within functional limits for tasks assessed      Verbal Expression   Overall Verbal Expression  Appears within functional limits for tasks assessed      Motor Speech   Phonation  Hoarse   mild-mod                     SLP Education - 11/29/18 1439    Education Details  deficit areas (attention, among others)    Person(s) Educated  Patient;Spouse    Methods  Explanation    Comprehension  Verbalized understanding   via interperter      SLP Short Term Goals - 11/29/18 1549      SLP SHORT TERM GOAL #1   Title  pt will demo selective attention for therapy tasks for 10 minutes x3 in min noisy environment x3 sessions    Time  4    Period  Weeks    Status  New      SLP SHORT TERM GOAL #2   Title  pt will demo emermgent awareness in simple cognitive communication tasks with mod A occasionally x3 sessions    Time  4    Period  Weeks    Status  New       SLP Long Term Goals - 11/29/18 1552      SLP LONG TERM GOAL #1   Title  pt will demo emermgent awareness in simple cognitive communication tasks with min A rarely x3 sessions    Time  8    Period  Weeks   or 17 sessions, for all LTGs     SLP LONG TERM GOAL #2   Title  pt will demo 25 minutes selective attention for mod complex therapy tasks in mod noisy  environment x 3 sessions    Time  8    Period  Weeks    Status  New       Plan - 11/29/18 1441    Clinical Impression Statement  Pt is a [place] female requiring [place] interpreter for ST. She presents today with cognitive communication deficits in at least attention and awareness. Pt under 24-7 supervision currently. See body of evaluation for details; Cognitive communication eval needs to be completed next session or two sessions, given need for interpreter assistance.    Speech Therapy Frequency  2x / week    Duration  --   8 weeks or 17 sessions   Treatment/Interventions  Cognitive reorganization;Internal/external aids;Patient/family education;Compensatory strategies;SLP instruction and feedback;Functional tasks    Potential to Achieve Goals  Good    Potential Considerations  Severity of impairments    Consulted and Agree with Plan of Care  Patient;Family member/caregiver    Family Member Consulted  husband       Patient will benefit from skilled therapeutic intervention in order to improve the following deficits and impairments:   Cognitive communication deficit  Hoarseness    Problem List Patient Active Problem List   Diagnosis Date Noted  . DM (diabetes mellitus), type 2 (HCC) 11/15/2018  . Embolic stroke (HCC) 11/15/2018  . Urinary tract infection without hematuria   . E coli bacteremia 11/08/2018  . Cerebral embolism with cerebral infarction 11/07/2018  . Pneumonia   . Pyelonephritis   . Endotracheal tube present   . Septic shock (HCC)   . Sepsis (HCC) 11/01/2018  . Acute respiratory failure (HCC) 11/01/2018  . Endophthalmitis, acute 05/13/2017  . Healthcare maintenance 09/09/2016  . DM (diabetes mellitus) type 2, uncontrolled, with ketoacidosis (HCC) 05/17/2014  . Heart murmur 12/14/2013  . Cough 07/06/2013  . Hypertriglyceridemia 07/06/2013  . Type 2 diabetes mellitus without complication, with long-term current use of insulin (HCC) 07/06/2013  .  Essential hypertension, benign 12/22/2012  . Hyperglycemia 11/17/2012  . Hypertension, uncontrolled 11/17/2012    SCHINKE,CARL ,MS, CCC-SLP  11/29/2018, 3:53 PM  [facility] [address] [place], [place], [address] Phone: [phone]   Fax:  [phone]  Name: Aimee Parker  Aimee Parker MRN: [id number] Date of Birth: [birth year]

## 2018-11-29 NOTE — Telephone Encounter (Signed)
Call placed to patient with assistance of [place] interpreter # [phone] with [facility] [employer] to inquire if patient has received her glucometer. She said that she has not received it.  Instructed her to contact her pharmacy and call us if she has any questions.  She said that she would have her husband take her to the pharmacy today.

## 2018-11-30 ENCOUNTER — Ambulatory Visit: Payer: Medicare Other | Admitting: Nurse Practitioner

## 2018-12-01 ENCOUNTER — Ambulatory Visit: Payer: Medicare Other | Admitting: Speech Pathology

## 2018-12-01 ENCOUNTER — Other Ambulatory Visit: Payer: Self-pay

## 2018-12-01 DIAGNOSIS — R41841 Cognitive communication deficit: Secondary | ICD-10-CM

## 2018-12-02 ENCOUNTER — Ambulatory Visit: Payer: Medicare Other | Admitting: Physical Therapy

## 2018-12-02 NOTE — Therapy (Signed)
[facility] [address] [place], [place], [address] Phone: [phone]   Fax:  [phone]  Speech Language Pathology Treatment  Patient Details  Name: Aimee Parker MRN: [id number] Date of Birth: [birth year] Referring Provider (SLP): Claudette Laws, MD   Encounter Date: 12/01/2018  End of Session - 12/02/18 1000    Visit Number  2    Number of Visits  17    Date for SLP Re-Evaluation  02/24/19    SLP Start Time  0845    SLP Stop Time   0930    SLP Time Calculation (min)  45 min    Activity Tolerance  Patient tolerated treatment well       Past Medical History:  Diagnosis Date  . Diabetes mellitus without complication (HCC)   . Hypertension   . Vitamin D deficiency     Past Surgical History:  Procedure Laterality Date  . EYE SURGERY     Left eye surgery  . PARS PLANA VITRECTOMY Right 05/13/2017   Procedure: PARS PLANA VITRECTOMY 25 GAUGE FOR ENDOPHTHALMITIS;  Surgeon: Edmon Crape, MD;  Location: [facility] OR;  Service: Ophthalmology;  Laterality: Right;    There were no vitals filed for this visit.  Subjective Assessment - 12/02/18 0959    Subjective  "I forget easily"    Patient is accompained by:  Family member   husband, tele interpreter [phone]   Currently in Pain?  No/denies            ADULT SLP TREATMENT - 12/02/18 0959      General Information   Behavior/Cognition  Alert;Cooperative      Treatment Provided   Treatment provided  Cognitive-Linquistic      Cognitive-Linquistic Treatment   Treatment focused on  Cognition;Patient/family/caregiver education    Skilled Treatment  SLP conducted pt interview via interpreter re: memory given pt's "s" statement. Pt reports misplacing items, forgetting names, forgetting what she walked into a room to do, forgetting details about conversations. "Sometimes I am talking about something and I forget." Completed CLQT via [place] interpreter with  scores as follows: Attention: 152/215 (mild), Memory: 117/185 (moderate), Executive functions: 17/40 (moderate), Language*: 23/37 (moderate), visuospatial skills 69/105 (mild). Overall severity: moderate. *Language score not likely a true reflection of pt ability due to cultural/linguistic differences. SLP educated pt and husband on pt's deficit areas and use of compensatory aids and strategies to assist pt with functional recall and schedule management.       Assessment / Recommendations / Plan   Plan  Goals updated      Progression Toward Goals   Progression toward goals  Progressing toward goals       SLP Education - 12/02/18 0959    Education Details  compensatory aids, use of planner to assist with recall and schedule management    Person(s) Educated  Patient;Spouse    Methods  Explanation    Comprehension  Verbalized understanding   via interpreter      SLP Short Term Goals - 12/02/18 1009      SLP SHORT TERM GOAL #1   Title  pt will demo selective attention for therapy tasks for 10 minutes x3 in min noisy environment x3 sessions    Time  4    Period  Weeks    Status  On-going      SLP SHORT TERM GOAL #2   Title  pt will demo emermgent awareness in simple cognitive communication tasks with mod A occasionally  x3 sessions    Time  4    Period  Weeks    Status  On-going      SLP SHORT TERM GOAL #3   Title  Pt will establish a memory compensation system and bring with her to 3 therapy sessions.    Time  4    Period  Weeks    Status  New       SLP Long Term Goals - 12/02/18 1010      SLP LONG TERM GOAL #1   Title  pt will demo emermgent awareness in simple cognitive communication tasks with min A rarely x3 sessions    Time  8    Period  Weeks   or 17 sessions, for all LTGs   Status  On-going      SLP LONG TERM GOAL #2   Title  pt will demo 25 minutes selective attention for mod complex therapy tasks in mod noisy environment x 3 sessions    Time  8    Period   Weeks    Status  On-going      SLP LONG TERM GOAL #3   Title  pt will successfully use memory system/strategies to access information about appointments, daily tasks or planned activities  x3 sessions    Time  8    Period  Weeks    Status  New      SLP LONG TERM GOAL #4   Title  Pt will report losing/misplacing items less than 2 times per week.    Time  8    Period  Weeks    Status  New      SLP LONG TERM GOAL #5   Title  Pt will use compensations for attention/executive function (checklists, timers, pre-planning) to complete tasks at home x5 per pt or spouse report    Time  8    Period  Weeks    Status  New       Plan - 12/02/18 1001    Clinical Impression Statement  Pt is a [place] female requiring [place] interpreter for [place]. She presents today with cognitive communication deficits in attention, memory, executive function and awareness. Pt under 24-7 supervision currently. See body of evaluation for details; Cognitive Linguistic Quick Test completed today. SLP educated pt and spouse re: use of memory compensations and aids. I recommend skilled ST to address the above deficits in order to improve pt's independence and quality of life.    Speech Therapy Frequency  2x / week    Duration  --   8 weeks or 17 sessions   Treatment/Interventions  Cognitive reorganization;Internal/external aids;Patient/family education;Compensatory strategies;SLP instruction and feedback;Functional tasks    Potential to Achieve Goals  Good    Potential Considerations  Severity of impairments    Consulted and Agree with Plan of Care  Patient;Family member/caregiver    Family Member Consulted  husband       Patient will benefit from skilled therapeutic intervention in order to improve the following deficits and impairments:   Cognitive communication deficit    Problem List Patient Active Problem List   Diagnosis Date Noted  . DM (diabetes mellitus), type 2 ([place]) 11/15/2018  . Embolic stroke  ([place]) 26/71/2458  . Urinary tract infection without hematuria   . E coli bacteremia 11/08/2018  . Cerebral embolism with cerebral infarction 11/07/2018  . Pneumonia   . Pyelonephritis   . Endotracheal tube present   . Septic shock ([place])   . Sepsis (  HCC) 11/01/2018  . Acute respiratory failure (HCC) 11/01/2018  . Endophthalmitis, acute 05/13/2017  . Healthcare maintenance 09/09/2016  . DM (diabetes mellitus) type 2, uncontrolled, with ketoacidosis (HCC) 05/17/2014  . Heart murmur 12/14/2013  . Cough 07/06/2013  . Hypertriglyceridemia 07/06/2013  . Type 2 diabetes mellitus without complication, with long-term current use of insulin (HCC) 07/06/2013  . Essential hypertension, benign 12/22/2012  . Hyperglycemia 11/17/2012  . Hypertension, uncontrolled 11/17/2012   Rondel BatonMary Beth Shaquoya Cosper, MS, CCC-SLP Speech-Language Pathologist  Arlana LindauMary E Terrence Wishon 12/02/2018, 10:19 AM  [facility] [address] [place], [place], [address] Phone: [phone]   Fax:  [phone]   Name: Aimee Parker MRN: [id number] Date of Birth: [birth year]

## 2018-12-05 ENCOUNTER — Encounter: Payer: Self-pay | Admitting: Physical Medicine and Rehabilitation

## 2018-12-05 ENCOUNTER — Other Ambulatory Visit: Payer: Self-pay

## 2018-12-05 ENCOUNTER — Encounter
Payer: Medicare Other | Attending: Physical Medicine and Rehabilitation | Admitting: Physical Medicine and Rehabilitation

## 2018-12-05 VITALS — BP 158/78 | HR 85 | Temp 98.9°F | Ht 61.5 in | Wt 134.0 lb

## 2018-12-05 DIAGNOSIS — I639 Cerebral infarction, unspecified: Secondary | ICD-10-CM | POA: Diagnosis present

## 2018-12-05 DIAGNOSIS — F5101 Primary insomnia: Secondary | ICD-10-CM | POA: Diagnosis not present

## 2018-12-05 MED ORDER — MELATONIN 5 MG PO CAPS: 1.0000 | ORAL_CAPSULE | Freq: Every evening | ORAL | 0 refills | Status: DC | PRN

## 2018-12-05 NOTE — Patient Instructions (Signed)
To help you sleep better, I recommend chamomile tea at night, no screen time, and melatonin 5mg  tablet, which I have sent to your pharmacy.

## 2018-12-05 NOTE — Progress Notes (Signed)
Subjective:    Patient ID: Aimee Parker, female    DOB: 1950/05/30, 68 y.o.   MRN: [id number]  HPI  Mrs. Strauch presents for hospital follow-up following CVA which left her with left-sided hemiplegia. She is doing very well and has a wonderful attitude. She has no complaints and continues to see gains in her strength. She has been working with therapy three times per week and feels she is able to accomplish the tasks she desires. Denies pain, constipation, depression. She is living with her son and husband.   She does complain of difficulty falling asleep at night. She does not watch TV before bed.   Pain Inventory Average Pain 0 Pain Right Now 0 My pain is no pain  In the last 24 hours, has pain interfered with the following? General activity 0 Relation with others 0 Enjoyment of life 0 What TIME of day is your pain at its worst? no pain Sleep (in general) Poor  Pain is worse with: no pain Pain improves with: no pain Relief from Meds: no pain  Mobility use a walker  Function retired  Neuro/Psych weakness trouble walking dizziness  Prior Studies Any changes since last visit?  no  Physicians involved in your care Any changes since last visit?  no Primary care Bertram Denver NP   Family History  Problem Relation Age of Onset  . Hypertension Mother   . Hypertension Father    Social History   Socioeconomic History  . Marital status: Married    Spouse name: Not on file  . Number of children: Not on file  . Years of education: Not on file  . Highest education level: Not on file  Occupational History  . Not on file  Social Needs  . Financial resource strain: Not very hard  . Food insecurity    Worry: Not on file    Inability: Not on file  . Transportation needs    Medical: Not on file    Non-medical: Not on file  Tobacco Use  . Smoking status: Never Smoker  . Smokeless tobacco: Never Used  Substance and Sexual Activity  . Alcohol use: No  . Drug  use: No  . Sexual activity: Not Currently  Lifestyle  . Physical activity    Days per week: Not on file    Minutes per session: Not on file  . Stress: Not on file  Relationships  . Social Musician on phone: Not on file    Gets together: Not on file    Attends religious service: Not on file    Active member of club or organization: Not on file    Attends meetings of clubs or organizations: Not on file    Relationship status: Not on file  Other Topics Concern  . Not on file  Social History Narrative  . Not on file   Past Surgical History:  Procedure Laterality Date  . EYE SURGERY     Left eye surgery  . PARS PLANA VITRECTOMY Right 05/13/2017   Procedure: PARS PLANA VITRECTOMY 25 GAUGE FOR ENDOPHTHALMITIS;  Surgeon: Edmon Crape, MD;  Location: [facility] OR;  Service: Ophthalmology;  Laterality: Right;   Past Medical History:  Diagnosis Date  . Diabetes mellitus without complication (HCC)   . Hypertension   . Vitamin D deficiency    BP (!) 158/78   Pulse 85   Temp 98.9 F (37.2 C)   Ht 5' 1.5" (1.562 m)   Wt  134 lb (60.8 kg)   SpO2 97%   BMI 24.91 kg/m   Opioid Risk Score:   Fall Risk Score:  `1  Depression screen PHQ 2/9  Depression screen [facility] 2/9 12/05/2018 01/17/2018 04/08/2017 09/09/2016 02/26/2016 12/19/2015 05/23/2015  Decreased Interest 0 0 0 0 2 0 0  Down, Depressed, Hopeless 0 0 0 0 0 0 0  PHQ - 2 Score 0 0 0 0 2 0 0  Altered sleeping 2 0 - - 0 - -  Tired, decreased energy 0 2 - - 1 - -  Change in appetite 0 0 - - 1 - -  Feeling bad or failure about yourself  0 0 - - 0 - -  Trouble concentrating 0 0 - - 0 - -  Moving slowly or fidgety/restless 0 0 - - 0 - -  Suicidal thoughts 0 0 - - 0 - -  PHQ-9 Score 2 2 - - 4 - -    Review of Systems  Constitutional: Negative.   HENT: Negative.   Eyes: Negative.   Respiratory: Negative.   Cardiovascular: Negative.   Gastrointestinal: Negative.   Endocrine: Negative.   Genitourinary: Negative.    Musculoskeletal: Positive for gait problem.  Skin: Negative.   Allergic/Immunologic: Negative.   Neurological: Positive for dizziness and weakness.  Hematological: Negative.   Psychiatric/Behavioral: Negative.   All other systems reviewed and are negative.      Objective:   Physical Exam  Gen: no distress, normal appearing HEENT: oral mucosa pink and moist, NCAT Cardio: Reg rate Chest: normal effort, normal rate of breathing Abd: soft, non-distended Ext: no edema Skin: intact Neuro: AOx3. Follows commands.  Musculoskeletal: 5/5 strength throughout with the exception of her left side. She has 4/5 EF, EE, and 4+/5 HF and KE. Functional gait with RW.  Psych: pleasant, normal affect       Assessment & Plan:  [name] is a 68 year old woman who presents for follow-up after CVA. She is doing well with no complaints of falls, pain, depression, or constipation.   To help her sleep better, I recommend chamomile tea at night, no screen time, and melatonin 5mg  tablet, which I have sent to her pharmacy.  Continue therapy three times per week to work on regaining left-sided strength and cognitive communication deficit. Educated that she should work on her home exercise program on days when she does not have therapy.   Continue walking 15 minutes multiple times per day. Eventually may benefit from [facility] to improve balance.   Thirty minutes of face to face patient care time were spent during this visit. All questions were encouraged and answered. Follow up with me in 4 weeks.  Visit performed with aid of interpreter.

## 2018-12-06 ENCOUNTER — Ambulatory Visit: Payer: Medicare Other

## 2018-12-06 DIAGNOSIS — R41841 Cognitive communication deficit: Secondary | ICD-10-CM | POA: Diagnosis not present

## 2018-12-06 NOTE — Therapy (Signed)
[facility] [address] [place], [place], [address] Phone: [phone]   Fax:  [phone]  Speech Language Pathology Treatment  Patient Details  Name: Aimee Parker MRN: [id number] Date of Birth: [birth year] Referring Provider (SLP): Alysia Penna, MD   Encounter Date: 12/06/2018  End of Session - 12/06/18 1226    Visit Number  3    Number of Visits  17    Date for SLP Re-Evaluation  02/24/19    SLP Start Time  1106    SLP Stop Time   1147    SLP Time Calculation (min)  41 min    Activity Tolerance  Patient tolerated treatment well       Past Medical History:  Diagnosis Date  . Diabetes mellitus without complication ([place])   . Hypertension   . Vitamin D deficiency     Past Surgical History:  Procedure Laterality Date  . EYE SURGERY     Left eye surgery  . PARS PLANA VITRECTOMY Right 05/13/2017   Procedure: PARS PLANA VITRECTOMY 25 GAUGE FOR ENDOPHTHALMITIS;  Surgeon: Hurman Horn, MD;  Location: [facility];  Service: Ophthalmology;  Laterality: Right;    There were no vitals filed for this visit.  Subjective Assessment - 12/06/18 1114    Subjective  Pt entered with husband.    Patient is accompained by:  Interpreter   Evelena Leyden, via Stratus   Currently in Pain?  No/denies            ADULT SLP TREATMENT - 12/06/18 1135      General Information   Behavior/Cognition  Alert;Cooperative;Pleasant mood      Treatment Provided   Treatment provided  Cognitive-Linquistic      Cognitive-Linquistic Treatment   Treatment focused on  Cognition;Patient/family/caregiver education    Skilled Treatment  With interpreter Evelena Leyden [phone]) SLP educated pt/husband about a memory aide (3-ring binder) with calendar and journal for review of daily tasks. SLP explained memory journal in detail and told pt/husband what rationale is for using it. Pt and husband demonstrated understanding. Husband stated he is having to remind  pt to take her meds. SLP suggested that they set an alarm and pt can ask husband to get meds for her. Pt did not correctly recall meds at 7am, 11am, 3pm , 8pm (pt said 7am, 9am, 1pm, 7pm). Told husband that when alarm goes off to wait for pt to ask him for her meds.       Assessment / Recommendations / Plan   Plan  Continue with current plan of care      Progression Toward Goals   Progression toward goals  Progressing toward goals       SLP Education - 12/06/18 1226    Education Details  memory book/binder, set alarms at med times and have pt ask husband to get her meds    Person(s) Educated  Patient;Spouse    Methods  Explanation    Comprehension  Verbalized understanding;Need further instruction       SLP Short Term Goals - 12/06/18 1227      SLP [place] #1   Title  pt will demo selective attention for therapy tasks for 10 minutes x3 in min noisy environment x3 sessions    Time  4    Period  Weeks    Status  On-going      SLP SHORT TERM GOAL #2   Title  pt will demo emermgent awareness in simple cognitive communication  tasks with mod A occasionally x3 sessions    Time  4    Period  Weeks    Status  On-going      SLP SHORT TERM GOAL #3   Title  Pt will establish a memory compensation system and bring with her to 3 therapy sessions.    Time  4    Period  Weeks    Status  New       SLP Long Term Goals - 12/06/18 1228      SLP LONG TERM GOAL #1   Title  pt will demo emermgent awareness in simple cognitive communication tasks with min A rarely x3 sessions    Time  8    Period  Weeks   or 17 sessions, for all LTGs   Status  On-going      SLP LONG TERM GOAL #2   Title  pt will demo 25 minutes selective attention for mod complex therapy tasks in mod noisy environment x 3 sessions    Time  8    Period  Weeks    Status  On-going      SLP LONG TERM GOAL #3   Title  pt will successfully use memory system/strategies to access information about appointments, daily  tasks or planned activities  x3 sessions    Time  8    Period  Weeks    Status  New      SLP LONG TERM GOAL #4   Title  Pt will report losing/misplacing items less than 2 times per week.    Time  8    Period  Weeks    Status  New      SLP LONG TERM GOAL #5   Title  Pt will use compensations for attention/executive function (checklists, timers, pre-planning) to complete tasks at home x5 per pt or spouse report    Time  8    Period  Weeks    Status  New       Plan - 12/06/18 1227    Clinical Impression Statement  Pt is a [place] female requiring [place] interpreter for ST. She presents today with cognitive communication deficits in attention, memory, executive function and awareness. Pt under 24-7 supervision currently. See "skilled treatment" for details; Cognitive Linguistic Quick Test completed today. SLP educated pt and spouse re: use of memory compensations and aids. I recommend skilled ST to address the above deficits in order to improve pt's independence and quality of life.    Speech Therapy Frequency  2x / week    Duration  --   8 weeks or 17 sessions   Treatment/Interventions  Cognitive reorganization;Internal/external aids;Patient/family education;Compensatory strategies;SLP instruction and feedback;Functional tasks    Potential to Achieve Goals  Good    Potential Considerations  Severity of impairments    Consulted and Agree with Plan of Care  Patient;Family member/caregiver    Family Member Consulted  husband       Patient will benefit from skilled therapeutic intervention in order to improve the following deficits and impairments:   Cognitive communication deficit    Problem List Patient Active Problem List   Diagnosis Date Noted  . DM (diabetes mellitus), type 2 (HCC) 11/15/2018  . Embolic stroke (HCC) 11/15/2018  . Urinary tract infection without hematuria   . E coli bacteremia 11/08/2018  . Cerebral embolism with cerebral infarction 11/07/2018  .  Pneumonia   . Pyelonephritis   . Endotracheal tube present   . Septic shock (HCC)   .  Sepsis (HCC) 11/01/2018  . Acute respiratory failure (HCC) 11/01/2018  . Endophthalmitis, acute 05/13/2017  . Healthcare maintenance 09/09/2016  . DM (diabetes mellitus) type 2, uncontrolled, with ketoacidosis (HCC) 05/17/2014  . Heart murmur 12/14/2013  . Cough 07/06/2013  . Hypertriglyceridemia 07/06/2013  . Type 2 diabetes mellitus without complication, with long-term current use of insulin (HCC) 07/06/2013  . Essential hypertension, benign 12/22/2012  . Hyperglycemia 11/17/2012  . Hypertension, uncontrolled 11/17/2012    Ashlyn Cabler ,MS, CCC-SLP  12/06/2018, 12:28 PM  [facility] [address] [place], [place], [address] Phone: [phone]   Fax:  [phone]   Name: Aimee Parker MRN: [id number] Date of Birth: 1950/02/17

## 2018-12-07 ENCOUNTER — Ambulatory Visit: Payer: Medicare Other | Admitting: Occupational Therapy

## 2018-12-07 ENCOUNTER — Other Ambulatory Visit: Payer: Self-pay

## 2018-12-07 DIAGNOSIS — M6281 Muscle weakness (generalized): Secondary | ICD-10-CM

## 2018-12-07 DIAGNOSIS — R278 Other lack of coordination: Secondary | ICD-10-CM

## 2018-12-07 DIAGNOSIS — I69318 Other symptoms and signs involving cognitive functions following cerebral infarction: Secondary | ICD-10-CM

## 2018-12-07 DIAGNOSIS — R41841 Cognitive communication deficit: Secondary | ICD-10-CM | POA: Diagnosis not present

## 2018-12-07 DIAGNOSIS — R2681 Unsteadiness on feet: Secondary | ICD-10-CM

## 2018-12-07 NOTE — Therapy (Signed)
[facility] [address] [place], [place], [address] Phone: [phone]   Fax:  [phone]  Occupational Therapy Evaluation  Patient Details  Name: Aimee Parker MRN: [id number] Date of Birth: [birth year] Referring Provider (OT): Dr. Alysia Penna   Encounter Date: 12/07/2018  OT End of Session - 12/07/18 1252    Visit Number  1    Number of Visits  17    Date for OT Re-Evaluation  02/06/19    Authorization Type  [employer] MCR primary, MCD secondary    Authorization - Visit Number  1    Authorization - Number of Visits  10    OT Start Time  0630    OT Stop Time  1100    OT Time Calculation (min)  45 min    Activity Tolerance  Patient tolerated treatment well    Behavior During Therapy  [facility] for tasks assessed/performed       Past Medical History:  Diagnosis Date  . Diabetes mellitus without complication ([place])   . Hypertension   . Vitamin D deficiency     Past Surgical History:  Procedure Laterality Date  . EYE SURGERY     Left eye surgery  . PARS PLANA VITRECTOMY Right 05/13/2017   Procedure: PARS PLANA VITRECTOMY 25 GAUGE FOR ENDOPHTHALMITIS;  Surgeon: Hurman Horn, MD;  Location: [facility];  Service: Ophthalmology;  Laterality: Right;    There were no vitals filed for this visit.  Subjective Assessment - 12/07/18 1023    Patient is accompanied by:  Family member;Interpreter   husband, stratus used for interpretation   Pertinent History  CVA 10/2018. Admitted for septic shock on 11/01/18 and intubated 11/02/18. MRI showed CVA on 11/06/18.    Limitations  fall risk, needs interpreter    Patient Stated Goals  get balance better    Currently in Pain?  No/denies        [facility] OT Assessment - 12/07/18 0001      Assessment   Medical Diagnosis  CVA   mild RT hemiparesis   Referring Provider (OT)  Dr. Alysia Penna    Onset Date/Surgical Date  11/01/18    Hand Dominance  Right    Prior Therapy  acute,  inpatient rehab      Precautions   Precaution Comments  no heavy lifting, no driving (pt did not drive before)       [place] Shower/Tub  Walk-in Shower   shower chair   Additional Comments  Pt lives in 1 story home w/ 1 step to enter    Lives With  Spouse;Daughter      Prior Function   Level of [place]  Retired   stopped working from [employer]     ADL   Eating/Feeding  Independent    Grooming  Independent    Upper Body Bathing  Minimal assistance    Lower Body Bathing  Minimal assistance    Upper Body Dressing  Increased time;Independent    Lower Body Dressing  Increased time;Independent    Leisure centre manager  Minimal assistance    ADL comments  Daughter currently performing all IADLS      Mobility   Mobility Status Comments  uses RW      Written Expression   Dominant Hand  Right    Handwriting  --   NO CHANGES     Vision -  History   Additional Comments  Pt reports no changes from stroke      Cognition   Cognition Comments  Pt does have difficulty w/ comprehension/understanding (even w/ interpreter) and memory per pt report via interpreter      Observation/Other Assessments   Observations  Pt initially told me LT side weaker however Rt side appears slightly weaker. However gross motor coordination w/ tossing ball was extremely impaired LUE      Sensation   Light Touch  --   intact per pt report via interpreter     Coordination   Finger Nose Finger Test  appears intact BUE's    9 Hole Peg Test  Right;Left    Right 9 Hole Peg Test  34.68 sec    Left 9 Hole Peg Test  32.10 sec    Coordination  Pt able to toss ball Rt hand w/o difficulty, Lt hand w/ max difficulty and possible apraxia?       Edema   Edema  none      ROM / Strength   AROM / PROM / Strength  AROM;Strength      AROM   Overall AROM Comments  BUE AROM WNL's. (Lt side weakness)       Strength   Overall Strength  Comments  MMT grossly 4/5 RUE, 4+/5 LUE      Hand Function   Right Hand Grip (lbs)  22 LBS    Left Hand Grip (lbs)  35 LBS                        OT Short Term Goals - 12/07/18 1300      OT SHORT TERM GOAL #1   Title  Independent with HEP for BUE arm strengthening, Rt hand strengthening, and LUE gross motor coordination - 01/05/19    Time  4    Period  Weeks    Status  New      OT SHORT TERM GOAL #2   Title  Pt to bathe self w/ supervision and A/E prn    Baseline  min assist    Time  4    Period  Weeks    Status  New      OT SHORT TERM GOAL #3   Title  Pt to improve Rt grip strength to 30 lbs for opening jars/containers    Baseline  22 lbs (Lt = 35 lbs)    Time  4    Period  Weeks    Status  New      OT SHORT TERM GOAL #4   Title  Pt to stand to fold towels and wash dishes for 10 minutes w/o rest    Time  4    Period  Weeks    Status  New      OT SHORT TERM GOAL #5   Title  Pt to make simple snack/sandwich w/ supervision    Time  4    Period  Weeks    Status  New        OT Long Term Goals - 12/07/18 1302      OT LONG TERM GOAL #1   Title  Pt to return to simple cooking tasks at mod I level    Time  8    Period  Weeks    Status  New      OT LONG TERM GOAL #2   Title  Pt to return to light household maintence/cleaning  and laundry tasks mod I level    Time  8    Period  Weeks    Status  New      OT LONG TERM GOAL #3   Title  Pt to attend to Rt side and perform tabletop visual scanning without cues    Time  8    Period  Weeks    Status  New      OT LONG TERM GOAL #4   Title  Pt to demo sufficient strength BUE's to put groceries away and retrieve/replace items up to 3 lbs on high shelf    Time  8    Period  Weeks    Status  New            Plan - 12/07/18 1253    Clinical Impression Statement  Pt is a 68 y.o. [place] female who presents to outpatient rehab for O.T. evaluation following stroke on 11/06/18. Pt was admitted to  hospital on 11/01/18 for septic shock d/t e. coli bacterial infection and required intubation on 11/02/18. On 11/06/18, pt developed symptoms of weakness and LOC and MRI showed Lt cerebellar stroke. Pt presents today with decreased balance, slightly decreased RUE weakness, decreased LUE gross motor coordination, cognitive changes, and inability to perform IALDS. Pt requires interpreter    OT Occupational Profile and History  Detailed Assessment- Review of Records and additional review of physical, cognitive, psychosocial history related to current functional performance    Occupational performance deficits (Please refer to evaluation for details):  ADL's;IADL's    Body Structure / Function / Physical Skills  ADL;IADL;Body mechanics;Balance;Mobility;Sensation;Coordination;UE functional use;Decreased knowledge of use of DME;Proprioception    Cognitive Skills  Attention;Memory;Thought;Understand    Rehab Potential  Good    Clinical Decision Making  Several treatment options, min-mod task modification necessary    Comorbidities Affecting Occupational Performance:  May have comorbidities impacting occupational performance    Modification or Assistance to Complete Evaluation   Max significant modification of tasks or assist is necessary to complete   partly due to language barrier   OT Frequency  2x / week    OT Duration  8 weeks   plus eval (however may only need O.T. 6 weeks)   OT Treatment/Interventions  Self-care/ADL training;Therapeutic exercise;Functional Mobility Training;Neuromuscular education;Manual Therapy;Therapeutic activities;DME and/or AE instruction;Cognitive remediation/compensation;Visual/perceptual remediation/compensation;Moist Heat;Passive range of motion;Patient/family education    Plan  HEP for BUE strengthening, Lt gross motor coordination, and putty HEP for Rt hand    Consulted and Agree with Plan of Care  Patient   via interpreter      Patient will benefit from skilled  therapeutic intervention in order to improve the following deficits and impairments:   Body Structure / Function / Physical Skills: ADL, IADL, Body mechanics, Balance, Mobility, Sensation, Coordination, UE functional use, Decreased knowledge of use of DME, Proprioception Cognitive Skills: Attention, Memory, Thought, Understand     Visit Diagnosis: Muscle weakness (generalized)  Other lack of coordination  Other symptoms and signs involving cognitive functions following cerebral infarction  Unsteadiness on feet    Problem List Patient Active Problem List   Diagnosis Date Noted  . DM (diabetes mellitus), type 2 (HCC) 11/15/2018  . Embolic stroke (HCC) 11/15/2018  . Urinary tract infection without hematuria   . E coli bacteremia 11/08/2018  . Cerebral embolism with cerebral infarction 11/07/2018  . Pneumonia   . Pyelonephritis   . Endotracheal tube present   . Septic shock (HCC)   . Sepsis (HCC) 11/01/2018  .  Acute respiratory failure (HCC) 11/01/2018  . Endophthalmitis, acute 05/13/2017  . Healthcare maintenance 09/09/2016  . DM (diabetes mellitus) type 2, uncontrolled, with ketoacidosis (HCC) 05/17/2014  . Heart murmur 12/14/2013  . Cough 07/06/2013  . Hypertriglyceridemia 07/06/2013  . Type 2 diabetes mellitus without complication, with long-term current use of insulin (HCC) 07/06/2013  . Essential hypertension, benign 12/22/2012  . Hyperglycemia 11/17/2012  . Hypertension, uncontrolled 11/17/2012    Kelli Churn, OTR/L 12/07/2018, 1:08 PM   [facility] [address] [place], [place], [address] Phone: [phone]   Fax:  [phone]  Name: Aimee Parker MRN: [id number] Date of Birth: 11-18-1950

## 2018-12-12 ENCOUNTER — Ambulatory Visit: Payer: Medicare Other | Admitting: Rehabilitation

## 2018-12-12 ENCOUNTER — Encounter: Payer: Self-pay | Admitting: Rehabilitation

## 2018-12-12 ENCOUNTER — Other Ambulatory Visit: Payer: Self-pay

## 2018-12-12 DIAGNOSIS — R2689 Other abnormalities of gait and mobility: Secondary | ICD-10-CM

## 2018-12-12 DIAGNOSIS — R41841 Cognitive communication deficit: Secondary | ICD-10-CM | POA: Diagnosis not present

## 2018-12-12 DIAGNOSIS — R2681 Unsteadiness on feet: Secondary | ICD-10-CM

## 2018-12-12 DIAGNOSIS — M6281 Muscle weakness (generalized): Secondary | ICD-10-CM

## 2018-12-12 NOTE — Therapy (Signed)
[facility] [address] [place], [place], [address] Phone: [phone]   Fax:  [phone]  Physical Therapy Evaluation  Patient Details  Name: Aimee Parker MRN: [id number] Date of Birth: [birth year] Referring Provider (PT): Claudette LawsAndrew Kirsteins, MD   Encounter Date: 12/12/2018  PT End of Session - 12/12/18 1328    Visit Number  1    Number of Visits  17    Date for PT Re-Evaluation  02/10/19    Authorization Type  UHC Medicare/medicaid (10th visit progress note needed)    PT Start Time  0850    PT Stop Time  0940    PT Time Calculation (min)  50 min    Activity Tolerance  Patient tolerated treatment well    Behavior During Therapy  [facility] for tasks assessed/performed       Past Medical History:  Diagnosis Date  . Diabetes mellitus without complication (HCC)   . Hypertension   . Vitamin D deficiency     Past Surgical History:  Procedure Laterality Date  . EYE SURGERY     Left eye surgery  . PARS PLANA VITRECTOMY Right 05/13/2017   Procedure: PARS PLANA VITRECTOMY 25 GAUGE FOR ENDOPHTHALMITIS;  Surgeon: Edmon Crapeankin, Gary A, MD;  [facility];  Service: Ophthalmology;  Laterality: Right;    There were no vitals filed for this visit.   Subjective Assessment - 12/12/18 0854    Subjective  Per interpretor-"I would like to recover back to where I was before"    Patient is accompained by:  Family member;Interpreter   [name] (pronounced)   Pertinent History  HTN, DMII    Limitations  Walking;House hold activities    Patient Stated Goals  "I want to be like I was before."    Currently in Pain?  No/denies         [facility] PT Assessment - 12/12/18 0856      Assessment   Medical Diagnosis  CVA    Referring Provider (PT)  Claudette LawsAndrew Kirsteins, MD    Onset Date/Surgical Date  11/01/18    Hand Dominance  Right      Precautions   Precautions  Fall    Precaution Comments  no heavy lifting, no driving (pt did not drive  before)       Balance Screen   Has the patient fallen in the past 6 months  No    Has the patient had a decrease in activity level because of a fear of falling?   Yes    Is the patient reluctant to leave their home because of a fear of falling?   Yes      Home Environment   Living Environment  Private residence    Living Arrangements  Spouse/significant other;Children    Available Help at Discharge  Available 24 hours/day    Type of Home  House    Home Access  Stairs to enter    Home Layout  One level    Home Equipment  Walker - 2 wheels;Cane - single point;Shower seat    Additional Comments  walk in shower       Prior Function   Level of Independence  Independent    Vocation  Retired    Leisure  Would occassionally do some shopping, help take care of grand kids       Cognition   Overall Cognitive Status  Impaired/Different from baseline    Area of Impairment  Awareness;Memory    Memory  Comments  husband reports he assists pt with meds more than prior to hospitalization      Observation/Other Assessments   Observations  slouched posture, posterior pelvic tilt.  She notes R LE weakness when walking       Sensation   Light Touch  Appears Intact      Coordination   Gross Motor Movements are Fluid and Coordinated  Yes    Fine Motor Movements are Fluid and Coordinated  Yes    Coordination and Movement Description  Slightly impaired due to weakness, but grossly [facility]    Heel Shin Test  Slightly slower on R, mostly WFL      ROM / Strength   AROM / PROM / Strength  Strength      Strength   Overall Strength  Deficits    Overall Strength Comments  R hip flex 3+/5, L hip flex 4/5, R knee ext 4/5, R knee flex 4/5, R ankle DF 3+/5.  L knee ext 4/5, L knee flex 3+/5 (some difficulty with following interpretor), L ankle DF 3+/5      Bed Mobility   Bed Mobility  Not assessed      Transfers   Transfers  Sit to Stand;Stand to Sit    Sit to Stand  6: Modified independent  (Device/Increase time)    Five time sit to stand comments   15.03 secs without hands, but legs relying on back of mat to stand upright    Stand to Sit  6: Modified independent (Device/Increase time)      Ambulation/Gait   Ambulation/Gait  Yes    Ambulation/Gait Assistance  5: Supervision    Ambulation/Gait Assistance Details  Pt ambulatory with RW for most of session at S to mod I level.  Did briefly assess her gait without AD and note she is close S to min/guard at times.  Feel that she will progress off RW quickly however did recommend close S from husband if ambulating without AD.  Pt verbalized understanding through interpretor.     Ambulation Distance (Feet)  200 Feet    Assistive device  Rolling walker    Gait Pattern  Step-through pattern;Decreased stride length;Trunk flexed    Ambulation Surface  Level;Indoor    Gait velocity  2.19 ft/sec with RW    Stairs  Yes    Stairs Assistance  5: Supervision    Stairs Assistance Details (indicate cue type and reason)  S for safety     Stair Management Technique  Two rails;Step to pattern;Forwards    Number of Stairs  4    Height of Stairs  6      Standardized Balance Assessment   Standardized Balance Assessment  Berg Balance Test      Berg Balance Test   Sit to Stand  Able to stand  independently using hands    Standing Unsupported  Able to stand safely 2 minutes    Sitting with Back Unsupported but Feet Supported on Floor or Stool  Able to sit safely and securely 2 minutes    Stand to Sit  Sits safely with minimal use of hands    Transfers  Able to transfer safely, definite need of hands    Standing Unsupported with Eyes Closed  Able to stand 10 seconds with supervision    Standing Unsupported with Feet Together  Able to place feet together independently and stand for 1 minute with supervision    From Standing, Reach Forward with Outstretched Arm  Can  reach forward >12 cm safely (5")    From Standing Position, Pick up Object from Floor   Able to pick up shoe, needs supervision    From Standing Position, Turn to Look Behind Over each Shoulder  Looks behind from both sides and weight shifts well    Turn 360 Degrees  Able to turn 360 degrees safely but slowly    Standing Unsupported, Alternately Place Feet on Step/Stool  Able to complete 4 steps without aid or supervision    Standing Unsupported, One Foot in Front  Able to plae foot ahead of the other independently and hold 30 seconds    Standing on One Leg  Tries to lift leg/unable to hold 3 seconds but remains standing independently    Total Score  42    Berg comment:  37-45 significant (>80%)                Objective measurements completed on examination: See above findings.              PT Education - 12/12/18 1327    Education Details  Education on evaluation results, POC, goals.    Person(s) Educated  Patient    Methods  Explanation    Comprehension  Verbalized understanding       PT Short Term Goals - 12/12/18 1335      PT SHORT TERM GOAL #1   Title  Pt will be ind with initial HEP in order to improve balance and functional mobility.  (Target Date: 01/11/19)    Time  4    Period  Weeks    Status  New    Target Date  01/11/19      PT SHORT TERM GOAL #2   Title  Pt will improve BERG balance score to 46/56 in order to indicate decreased fall risk.      PT SHORT TERM GOAL #3   Title  Pt will improve gait speed to >/=2.62 ft/sec with LRAD in order to indicate safe community ambulation.      PT SHORT TERM GOAL #4   Title  Pt will perform 5TSS in </=11 secs without UE support and no reliance of LEs on chair for support to indicate improved functional strength.      PT SHORT TERM GOAL #5   Title  Pt will ambulate x 100' at mod I level in home without AD in order to indicate improved independence in home.      Additional Short Term Goals   Additional Short Term Goals  Yes      PT SHORT TERM GOAL #6   Title  Pt will ambulate x 500' w/ LRAD  at S level over unlevel paved surfaces in order to indicate safe community ambulation.        PT Long Term Goals - 12/12/18 1339      PT LONG TERM GOAL #1   Title  Pt will be ind with final HEP in order to indicate decreased fall risk and improved functional mobility (Target Date: 02/10/2019)    Time  8    Period  Weeks    Status  New    Target Date  02/10/19      PT LONG TERM GOAL #2   Title  Pt will improve BERG balance score to >/=50/56 in order to indicate decreased fall risk.      PT LONG TERM GOAL #3   Title  Pt will ambulate at gait speed of </=2.62 ft/sec without  AD in order to indicate safe and more independent community ambulation.      PT LONG TERM GOAL #4   Title  Pt will ambulate over varying outdoor surfaces x 1000' without AD at S level in order to indicate more independent community negotiation.      PT LONG TERM GOAL #5   Title  Assess DGI/FGA as able and write appropriate goal.             Plan - 12/12/18 1329    Clinical Impression Statement  Pt presents s/p L cerebellar CVA on 11/06/18 (was hospitalized for septic shock requiring intubation on 11/01/18) with mild R hemiparesis, impaired cognition and decreased balance. Note history of HTN and DMII.  Upon PT evaluation, note 5TSS time of 15.03 secs without UE support but reliance of LEs on mat for stability, gait speed of 2.19 ft/sec with RW indicative of limited community ambulation, and BERG balance score of 42/56 indicative of significant fall risk.  Pt will benefit from skilled OP neuro PT in order to address deficits.    Personal Factors and Comorbidities  Comorbidity 2;Comorbidity 3+    Comorbidities  HTN, DMII, CVA    Examination-Activity Limitations  Caring for Others;Stairs;Locomotion Level;Carry    Examination-Participation Restrictions  Community Activity;Medication Management;Laundry    Stability/Clinical Decision Making  Evolving/Moderate complexity    Clinical Decision Making  Moderate     Rehab Potential  Excellent    PT Frequency  2x / week    PT Duration  8 weeks    PT Treatment/Interventions  ADLs/Self Care Home Management;Aquatic Therapy;DME Instruction;Gait training;Stair training;Functional mobility training;Therapeutic activities;Therapeutic exercise;Balance training;Neuromuscular re-education;Cognitive remediation;Patient/family education;Orthotic Fit/Training;Passive range of motion;Vestibular    PT Next Visit Plan  Work on gait without device-obstacles, around and over, work on quality of gait without device (can she ambulate safely in home without S?), provide with HEP for high level balance and strengthening    Consulted and Agree with Plan of Care  Patient;Other (Comment)   husband/pt through interpretor      Patient will benefit from skilled therapeutic intervention in order to improve the following deficits and impairments:  Abnormal gait, Decreased activity tolerance, Decreased balance, Decreased cognition, Decreased endurance, Decreased knowledge of precautions, Decreased mobility, Decreased strength, Decreased safety awareness, Impaired perceived functional ability, Impaired flexibility, Postural dysfunction  Visit Diagnosis: Unsteadiness on feet  Muscle weakness (generalized)  Other abnormalities of gait and mobility     Problem List Patient Active Problem List   Diagnosis Date Noted  . DM (diabetes mellitus), type 2 ([place]) 11/15/2018  . Embolic stroke ([place]) 50/53/9767  . Urinary tract infection without hematuria   . E coli bacteremia 11/08/2018  . Cerebral embolism with cerebral infarction 11/07/2018  . Pneumonia   . Pyelonephritis   . Endotracheal tube present   . Septic shock ([place])   . Sepsis ([place]) 11/01/2018  . Acute respiratory failure ([place]) 11/01/2018  . Endophthalmitis, acute 05/13/2017  . Healthcare maintenance 09/09/2016  . DM (diabetes mellitus) type 2, uncontrolled, with ketoacidosis ([place]) 05/17/2014  . Heart murmur 12/14/2013  .  Cough 07/06/2013  . Hypertriglyceridemia 07/06/2013  . Type 2 diabetes mellitus without complication, with long-term current use of insulin ([place]) 07/06/2013  . Essential hypertension, benign 12/22/2012  . Hyperglycemia 11/17/2012  . Hypertension, uncontrolled 11/17/2012    Cameron Sprang, PT, MPT [facility] [address] [place], [place], [address] Phone: [phone]   Fax:  [phone] 12/12/18, 1:43 PM  Name: Aimee Weipert  Parker MRN: [id number] Date of Birth: 1950/07/30

## 2018-12-13 ENCOUNTER — Ambulatory Visit: Payer: Medicare Other | Admitting: Occupational Therapy

## 2018-12-13 ENCOUNTER — Ambulatory Visit: Payer: Medicare Other | Attending: Physical Medicine & Rehabilitation

## 2018-12-13 DIAGNOSIS — M6281 Muscle weakness (generalized): Secondary | ICD-10-CM | POA: Diagnosis present

## 2018-12-13 DIAGNOSIS — I69318 Other symptoms and signs involving cognitive functions following cerebral infarction: Secondary | ICD-10-CM | POA: Diagnosis present

## 2018-12-13 DIAGNOSIS — R2681 Unsteadiness on feet: Secondary | ICD-10-CM | POA: Insufficient documentation

## 2018-12-13 DIAGNOSIS — R41841 Cognitive communication deficit: Secondary | ICD-10-CM | POA: Insufficient documentation

## 2018-12-13 DIAGNOSIS — R2689 Other abnormalities of gait and mobility: Secondary | ICD-10-CM | POA: Insufficient documentation

## 2018-12-13 DIAGNOSIS — R278 Other lack of coordination: Secondary | ICD-10-CM

## 2018-12-13 NOTE — Patient Instructions (Signed)
1. Grip Strengthening (Resistive Putty)   Squeeze putty using thumb and all fingers. Repeat _20___ times. Do __2__ sessions per day.   2. Roll putty into tube on table and pinch between each finger and thumb x 10 reps each. (can do ring and small finger together)     Copyright  VHI. All rights reserved.   

## 2018-12-13 NOTE — Therapy (Signed)
[facility] [address] [place], [place], [address] Phone: [phone]   Fax:  [phone]  Occupational Therapy Treatment  Patient Details  Name: Aimee Parker MRN: [id number] Date of Birth: 1951-01-07 Referring Provider (OT): Dr. Claudette LawsAndrew Kirsteins   Encounter Date: 12/13/2018  OT End of Session - 12/13/18 0906    Visit Number  2    Number of Visits  17    Date for OT Re-Evaluation  02/06/19    Authorization Type  [employer] MCR primary, MCD secondary    Authorization - Visit Number  2    Authorization - Number of Visits  10    OT Start Time  0805    OT Stop Time  0845    OT Time Calculation (min)  40 min    Activity Tolerance  Patient tolerated treatment well    Behavior During Therapy  [facility] for tasks assessed/performed       Past Medical History:  Diagnosis Date  . Diabetes mellitus without complication (HCC)   . Hypertension   . Vitamin D deficiency     Past Surgical History:  Procedure Laterality Date  . EYE SURGERY     Left eye surgery  . PARS PLANA VITRECTOMY Right 05/13/2017   Procedure: PARS PLANA VITRECTOMY 25 GAUGE FOR ENDOPHTHALMITIS;  Surgeon: Edmon Crapeankin, Gary A, MD;  [facility];  Service: Ophthalmology;  Laterality: Right;    There were no vitals filed for this visit.  Subjective Assessment - 12/13/18 0809    Patient is accompanied by:  Family member;Interpreter   husband, stratus used for interpretation   Pertinent History  CVA 10/2018. Admitted for septic shock on 11/01/18 and intubated 11/02/18. MRI showed CVA on 11/06/18.    Limitations  fall risk, needs interpreter    Patient Stated Goals  get balance better    Currently in Pain?  Yes    Pain Score  4     Pain Location  Head    Pain Descriptors / Indicators  Aching    Pain Onset  More than a month ago    Pain Frequency  Intermittent    Aggravating Factors   laying on her side    Pain Relieving Factors  laying on her back                  Treatment: Pt was instructed in red putty HEP for RUE, see pt instructions. Min v.c via interpreter. Cane exercises for shoulder flexion, chest press and shoulder abduction, 10-20 reps each, mod v.c for LUE use due to apraxia Biceps curls 20 reps each each left and right UE's with 2 lbs weight, Standing to perform functional reaching and sustained pinch to place and remove graded clothespins from vertical antennae, min v.c Arm bike x 5 mins level 1 for reciprocal movement.            OT Short Term Goals - 12/07/18 1300      OT SHORT TERM GOAL #1   Title  Independent with HEP for BUE arm strengthening, Rt hand strengthening, and LUE gross motor coordination - 01/05/19    Time  4    Period  Weeks    Status  New      OT SHORT TERM GOAL #2   Title  Pt to bathe self w/ supervision and A/E prn    Baseline  min assist    Time  4    Period  Weeks    Status  New      OT SHORT TERM GOAL #3   Title  Pt to improve Rt grip strength to 30 lbs for opening jars/containers    Baseline  22 lbs (Lt = 35 lbs)    Time  4    Period  Weeks    Status  New      OT SHORT TERM GOAL #4   Title  Pt to stand to fold towels and wash dishes for 10 minutes w/o rest    Time  4    Period  Weeks    Status  New      OT SHORT TERM GOAL #5   Title  Pt to make simple snack/sandwich w/ supervision    Time  4    Period  Weeks    Status  New        OT Long Term Goals - 12/07/18 1302      OT LONG TERM GOAL #1   Title  Pt to return to simple cooking tasks at mod I level    Time  8    Period  Weeks    Status  New      OT LONG TERM GOAL #2   Title  Pt to return to light household maintence/cleaning and laundry tasks mod I level    Time  8    Period  Weeks    Status  New      OT LONG TERM GOAL #3   Title  Pt to attend to Rt side and perform tabletop visual scanning without cues    Time  8    Period  Weeks    Status  New      OT LONG TERM GOAL #4   Title  Pt to demo  sufficient strength BUE's to put groceries away and retrieve/replace items up to 3 lbs on high shelf    Time  8    Period  Weeks    Status  New            Plan - 12/13/18 0906    Clinical Impression Statement  Pt is progressing towards goals. She demonstrates understanding of red putty HEP.    OT Occupational Profile and History  Detailed Assessment- Review of Records and additional review of physical, cognitive, psychosocial history related to current functional performance    Occupational performance deficits (Please refer to evaluation for details):  ADL's;IADL's    Body Structure / Function / Physical Skills  ADL;IADL;Body mechanics;Balance;Mobility;Sensation;Coordination;UE functional use;Decreased knowledge of use of DME;Proprioception    Cognitive Skills  Attention;Memory;Thought;Understand    Rehab Potential  Good    Clinical Decision Making  Several treatment options, min-mod task modification necessary    Comorbidities Affecting Occupational Performance:  May have comorbidities impacting occupational performance    Modification or Assistance to Complete Evaluation   Max significant modification of tasks or assist is necessary to complete   partly due to language barrier   OT Frequency  2x / week    OT Duration  8 weeks   plus eval (however may only need O.T. 6 weeks)   OT Treatment/Interventions  Self-care/ADL training;Therapeutic exercise;Functional Mobility Training;Neuromuscular education;Manual Therapy;Therapeutic activities;DME and/or AE instruction;Cognitive remediation/compensation;Visual/perceptual remediation/compensation;Moist Heat;Passive range of motion;Patient/family education    Plan  Cane exercises, issue as HEP, functional use of UE's    Consulted and Agree with Plan of Care  Patient   via interpreter      Patient will benefit from skilled therapeutic intervention in  order to improve the following deficits and impairments:   Body Structure / Function /  Physical Skills: ADL, IADL, Body mechanics, Balance, Mobility, Sensation, Coordination, UE functional use, Decreased knowledge of use of DME, Proprioception Cognitive Skills: Attention, Memory, Thought, Understand     Visit Diagnosis: Muscle weakness (generalized)  Other lack of coordination  Other symptoms and signs involving cognitive functions following cerebral infarction  Other abnormalities of gait and mobility    Problem List Patient Active Problem List   Diagnosis Date Noted  . DM (diabetes mellitus), type 2 ([place]) 11/15/2018  . Embolic stroke ([place]) 65/99/3570  . Urinary tract infection without hematuria   . E coli bacteremia 11/08/2018  . Cerebral embolism with cerebral infarction 11/07/2018  . Pneumonia   . Pyelonephritis   . Endotracheal tube present   . Septic shock ([place])   . Sepsis ([place]) 11/01/2018  . Acute respiratory failure ([place]) 11/01/2018  . Endophthalmitis, acute 05/13/2017  . Healthcare maintenance 09/09/2016  . DM (diabetes mellitus) type 2, uncontrolled, with ketoacidosis ([place]) 05/17/2014  . Heart murmur 12/14/2013  . Cough 07/06/2013  . Hypertriglyceridemia 07/06/2013  . Type 2 diabetes mellitus without complication, with long-term current use of insulin ([place]) 07/06/2013  . Essential hypertension, benign 12/22/2012  . Hyperglycemia 11/17/2012  . Hypertension, uncontrolled 11/17/2012    Aimee Parker 12/13/2018, 9:08 AM Theone Murdoch, OTR/L [phone] Phone: [phone] 9:11 AM 12/13/18 [place] [address] [place], [place], [address] Phone: [phone]   Fax:  [phone]  Name: Aimee Parker MRN: [id number] Date of Birth: [birth year]

## 2018-12-13 NOTE — Therapy (Signed)
[facility] [address] [place], [place], [address] Phone: [phone]   Fax:  [phone]  Speech Language Pathology Treatment  Patient Details  Name: Aimee Parker MRN: [id number] Date of Birth: Jul 04, 1950 Referring Provider (SLP): Alysia Penna, MD   Encounter Date: 12/13/2018  End of Session - 12/13/18 1252    Visit Number  4    Number of Visits  17    Date for SLP Re-Evaluation  02/24/19    SLP Start Time  0850    SLP Stop Time   0930    SLP Time Calculation (min)  40 min    Activity Tolerance  Patient tolerated treatment well       Past Medical History:  Diagnosis Date  . Diabetes mellitus without complication ([place])   . Hypertension   . Vitamin D deficiency     Past Surgical History:  Procedure Laterality Date  . EYE SURGERY     Left eye surgery  . PARS PLANA VITRECTOMY Right 05/13/2017   Procedure: PARS PLANA VITRECTOMY 25 GAUGE FOR ENDOPHTHALMITIS;  Surgeon: Hurman Horn, MD;  Location: [facility];  Service: Ophthalmology;  Laterality: Right;    There were no vitals filed for this visit.  Subjective Assessment - 12/13/18 0854    Subjective  Aimee Parker [phone]) from [employer] interpreted today.    Patient is accompained by:  Interpreter;Family member   husband   Currently in Pain?  Yes    Pain Score  4     Pain Location  Head    Pain Orientation  Mid    Pain Descriptors / Indicators  Aching    Pain Type  Acute pain    Pain Onset  More than a month ago            ADULT SLP TREATMENT - 12/13/18 0856      General Information   Behavior/Cognition  Alert;Cooperative;Pleasant mood      Treatment Provided   Treatment provided  Cognitive-Linquistic      Cognitive-Linquistic Treatment   Treatment focused on  Cognition;Patient/family/caregiver education    Skilled Treatment  SLP asked pt and husband about meds. They report alarms are helping [place] tell husband when to give her the meds. SLP  asked about memory book - pt stated she wrote things down but did not do so when asked further. Pt reports her memory has improved since she has been d/c'd; husband disagrees and states pt now eats prior to getting insulin med which she did not do prior to hospitalization. SLP collaborated with husband and pt to figure a system to remind pt not to eat and to take insulin. SLP made sign as an example and husband translated the sign for the patient. Pt recalled she was finished for today without further therapies.      Assessment / Recommendations / Plan   Plan  Continue with current plan of care      Progression Toward Goals   Progression toward goals  Progressing toward goals       SLP Education - 12/13/18 1252    Education Details  compensations for pt not remembering to take insulin prior to POs    Person(s) Educated  Patient;Spouse    Methods  Explanation;Demonstration    Comprehension  Verbalized understanding;Returned demonstration       SLP Short Term Goals - 12/13/18 1253      SLP SHORT TERM GOAL #1   Title  pt will demo selective  attention for therapy tasks for 10 minutes x3 in min noisy environment x3 sessions    Time  3    Period  Weeks    Status  On-going      SLP SHORT TERM GOAL #2   Title  pt will demo emermgent awareness in simple cognitive communication tasks with mod A occasionally x3 sessions    Time  3    Period  Weeks    Status  On-going      SLP SHORT TERM GOAL #3   Title  Pt will establish a memory compensation system and bring with her to 3 therapy sessions.    Time  3    Period  Weeks    Status  New       SLP Long Term Goals - 12/13/18 1253      SLP LONG TERM GOAL #1   Title  pt will demo emermgent awareness in simple cognitive communication tasks with min A rarely x3 sessions    Time  7    Period  Weeks   or 17 sessions, for all LTGs   Status  On-going      SLP LONG TERM GOAL #2   Title  pt will demo 25 minutes selective attention for mod  complex therapy tasks in mod noisy environment x 3 sessions    Time  7    Period  Weeks    Status  On-going      SLP LONG TERM GOAL #3   Title  pt will successfully use memory system/strategies to access information about appointments, daily tasks or planned activities  x3 sessions    Time  7    Period  Weeks    Status  New      SLP LONG TERM GOAL #4   Title  Pt will report losing/misplacing items less than 2 times per week.    Time  7    Period  Weeks    Status  New      SLP LONG TERM GOAL #5   Title  Pt will use compensations for attention/executive function (checklists, timers, pre-planning) to complete tasks at home x5 per pt or spouse report    Time  7    Period  Weeks    Status  New       Plan - 12/13/18 1253    Clinical Impression Statement  Pt is a [place] female requiring [place] interpreter for ST. She presents today with cognitive communication deficits in attention, memory, executive function and awareness. Pt under 24-7 supervision currently. See "skilled treatment" for details; Cognitive Linguistic Quick Test completed today. SLP educated pt and spouse re: use of memory compensations and aids. I recommend skilled ST to address the above deficits in order to improve pt's independence and quality of life.    Speech Therapy Frequency  2x / week    Duration  --   8 weeks or 17 sessions   Treatment/Interventions  Cognitive reorganization;Internal/external aids;Patient/family education;Compensatory strategies;SLP instruction and feedback;Functional tasks    Potential to Achieve Goals  Good    Potential Considerations  Severity of impairments    Consulted and Agree with Plan of Care  Patient;Family member/caregiver    Family Member Consulted  husband       Patient will benefit from skilled therapeutic intervention in order to improve the following deficits and impairments:   Cognitive communication deficit    Problem List Patient Active Problem List    Diagnosis Date Noted  .  DM (diabetes mellitus), type 2 (HCC) 11/15/2018  . Embolic stroke (HCC) 11/15/2018  . Urinary tract infection without hematuria   . E coli bacteremia 11/08/2018  . Cerebral embolism with cerebral infarction 11/07/2018  . Pneumonia   . Pyelonephritis   . Endotracheal tube present   . Septic shock (HCC)   . Sepsis (HCC) 11/01/2018  . Acute respiratory failure (HCC) 11/01/2018  . Endophthalmitis, acute 05/13/2017  . Healthcare maintenance 09/09/2016  . DM (diabetes mellitus) type 2, uncontrolled, with ketoacidosis (HCC) 05/17/2014  . Heart murmur 12/14/2013  . Cough 07/06/2013  . Hypertriglyceridemia 07/06/2013  . Type 2 diabetes mellitus without complication, with long-term current use of insulin (HCC) 07/06/2013  . Essential hypertension, benign 12/22/2012  . Hyperglycemia 11/17/2012  . Hypertension, uncontrolled 11/17/2012    Elektra Wartman ,MS, CCC-SLP  12/13/2018, 12:54 PM  [facility] [address] [place], [place], [address] Phone: [phone]   Fax:  [phone]   Name: Aimee Parker MRN: [id number] Date of Birth: [birth year]

## 2018-12-15 ENCOUNTER — Other Ambulatory Visit: Payer: Self-pay

## 2018-12-15 ENCOUNTER — Encounter: Payer: Self-pay | Admitting: Occupational Therapy

## 2018-12-15 ENCOUNTER — Ambulatory Visit: Payer: Medicare Other | Admitting: Occupational Therapy

## 2018-12-15 DIAGNOSIS — M6281 Muscle weakness (generalized): Secondary | ICD-10-CM

## 2018-12-15 DIAGNOSIS — R41841 Cognitive communication deficit: Secondary | ICD-10-CM | POA: Diagnosis not present

## 2018-12-15 DIAGNOSIS — I69318 Other symptoms and signs involving cognitive functions following cerebral infarction: Secondary | ICD-10-CM

## 2018-12-15 DIAGNOSIS — R278 Other lack of coordination: Secondary | ICD-10-CM

## 2018-12-15 NOTE — Therapy (Signed)
[facility] [address] [place], [place], [address] Phone: [phone]   Fax:  [phone]  Occupational Therapy Treatment  Patient Details  Name: Aimee Parker MRN: [id number] Date of Birth: Jul 05, 1950 Referring Provider (OT): Dr. Claudette LawsAndrew Kirsteins   Encounter Date: 12/15/2018  OT End of Session - 12/15/18 1035    Visit Number  3    Number of Visits  17    Date for OT Re-Evaluation  02/06/19    Authorization Type  [employer] MCR primary, MCD secondary    Authorization - Visit Number  3    Authorization - Number of Visits  10    OT Start Time  1022    OT Stop Time  1100    OT Time Calculation (min)  38 min    Activity Tolerance  Patient tolerated treatment well    Behavior During Therapy  [facility] for tasks assessed/performed       Past Medical History:  Diagnosis Date  . Diabetes mellitus without complication (HCC)   . Hypertension   . Vitamin D deficiency     Past Surgical History:  Procedure Laterality Date  . EYE SURGERY     Left eye surgery  . PARS PLANA VITRECTOMY Right 05/13/2017   Procedure: PARS PLANA VITRECTOMY 25 GAUGE FOR ENDOPHTHALMITIS;  Surgeon: Edmon Crapeankin, Gary A, MD;  [facility];  Service: Ophthalmology;  Laterality: Right;    There were no vitals filed for this visit.  Subjective Assessment - 12/15/18 1051    Patient is accompanied by:  Family member;Interpreter   husband, [employer] used for interpretation   Pertinent History  CVA 10/2018. Admitted for septic shock on 11/01/18 and intubated 11/02/18. MRI showed CVA on 11/06/18.    Limitations  fall risk, needs interpreter    Patient Stated Goals  get balance better    Currently in Pain?  No/denies    Pain Onset  More than a month ago        Treatment: Seated functional use of bilateral UE's to place various sized pegs into semi circle with right and left UE's, pt demonstrates more coordinated movement today. Standing to fold towels, washcloths  and to place/ remove graded clothespins with right and left UE's for sustained pinch and functional reach. Pt was able to stand for 7 mins without rest break. Arm bike x 6 mins level 3 for reciprocal movement and conditioning.                     OT Short Term Goals - 12/07/18 1300      OT SHORT TERM GOAL #1   Title  Independent with HEP for BUE arm strengthening, Rt hand strengthening, and LUE gross motor coordination - 01/05/19    Time  4    Period  Weeks    Status  New      OT SHORT TERM GOAL #2   Title  Pt to bathe self w/ supervision and A/E prn    Baseline  min assist    Time  4    Period  Weeks    Status  New      OT SHORT TERM GOAL #3   Title  Pt to improve Rt grip strength to 30 lbs for opening jars/containers    Baseline  22 lbs (Lt = 35 lbs)    Time  4    Period  Weeks    Status  New      OT  SHORT TERM GOAL #4   Title  Pt to stand to fold towels and wash dishes for 10 minutes w/o rest    Time  4    Period  Weeks    Status  New      OT SHORT TERM GOAL #5   Title  Pt to make simple snack/sandwich w/ supervision    Time  4    Period  Weeks    Status  New        OT Long Term Goals - 12/07/18 1302      OT LONG TERM GOAL #1   Title  Pt to return to simple cooking tasks at mod I level    Time  8    Period  Weeks    Status  New      OT LONG TERM GOAL #2   Title  Pt to return to light household maintence/cleaning and laundry tasks mod I level    Time  8    Period  Weeks    Status  New      OT LONG TERM GOAL #3   Title  Pt to attend to Rt side and perform tabletop visual scanning without cues    Time  8    Period  Weeks    Status  New      OT LONG TERM GOAL #4   Title  Pt to demo sufficient strength BUE's to put groceries away and retrieve/replace items up to 3 lbs on high shelf    Time  8    Period  Weeks    Status  New            Plan - 12/15/18 1035    Clinical Impression Statement  Pt is progressing towards goals. She  demonstrates understanding of red putty HEP.    OT Occupational Profile and History  Detailed Assessment- Review of Records and additional review of physical, cognitive, psychosocial history related to current functional performance    Occupational performance deficits (Please refer to evaluation for details):  ADL's;IADL's    Body Structure / Function / Physical Skills  ADL;IADL;Body mechanics;Balance;Mobility;Sensation;Coordination;UE functional use;Decreased knowledge of use of DME;Proprioception    Cognitive Skills  Attention;Memory;Thought;Understand    Rehab Potential  Good    Clinical Decision Making  Several treatment options, min-mod task modification necessary    Comorbidities Affecting Occupational Performance:  May have comorbidities impacting occupational performance    Modification or Assistance to Complete Evaluation   Max significant modification of tasks or assist is necessary to complete   partly due to language barrier   OT Frequency  2x / week    OT Duration  8 weeks   plus eval (however may only need O.T. 6 weeks)   OT Treatment/Interventions  Self-care/ADL training;Therapeutic exercise;Functional Mobility Training;Neuromuscular education;Manual Therapy;Therapeutic activities;DME and/or AE instruction;Cognitive remediation/compensation;Visual/perceptual remediation/compensation;Moist Heat;Passive range of motion;Patient/family education    Plan  Cane exercises,consider  issuing as HEP, simple functional tasks, use [place] interpreter    Consulted and Agree with Plan of Care  Patient   via interpreter      Patient will benefit from skilled therapeutic intervention in order to improve the following deficits and impairments:   Body Structure / Function / Physical Skills: ADL, IADL, Body mechanics, Balance, Mobility, Sensation, Coordination, UE functional use, Decreased knowledge of use of DME, Proprioception Cognitive Skills: Attention, Memory, Thought, Understand      Visit Diagnosis: No diagnosis found.    Problem List Patient Active Problem  List   Diagnosis Date Noted  . DM (diabetes mellitus), type 2 (HCC) 11/15/2018  . Embolic stroke (HCC) 11/15/2018  . Urinary tract infection without hematuria   . E coli bacteremia 11/08/2018  . Cerebral embolism with cerebral infarction 11/07/2018  . Pneumonia   . Pyelonephritis   . Endotracheal tube present   . Septic shock (HCC)   . Sepsis (HCC) 11/01/2018  . Acute respiratory failure (HCC) 11/01/2018  . Endophthalmitis, acute 05/13/2017  . Healthcare maintenance 09/09/2016  . DM (diabetes mellitus) type 2, uncontrolled, with ketoacidosis (HCC) 05/17/2014  . Heart murmur 12/14/2013  . Cough 07/06/2013  . Hypertriglyceridemia 07/06/2013  . Type 2 diabetes mellitus without complication, with long-term current use of insulin (HCC) 07/06/2013  . Essential hypertension, benign 12/22/2012  . Hyperglycemia 11/17/2012  . Hypertension, uncontrolled 11/17/2012    Rashi Giuliani 12/15/2018, 10:51 AM  [facility] [address] [place], [place], [address] Phone: [phone]   Fax:  [phone]  Name: Aimee Parker MRN: [id number] Date of Birth: [birth year]

## 2018-12-23 ENCOUNTER — Other Ambulatory Visit: Payer: Self-pay

## 2018-12-23 ENCOUNTER — Ambulatory Visit: Payer: Medicare Other

## 2018-12-23 DIAGNOSIS — R41841 Cognitive communication deficit: Secondary | ICD-10-CM | POA: Diagnosis not present

## 2018-12-23 NOTE — Patient Instructions (Signed)
    B?n nn vi?t m?i th? ra gi?y ho?c bo th?c trong ?i?n tho?i, n?u b?n ngh? r?ng mnh c th? qun ?i?u g ?.

## 2018-12-23 NOTE — Therapy (Signed)
[facility] [address] [place], [place], [address] Phone: [phone]   Fax:  [phone]  Speech Language Pathology Treatment  Patient Details  Name: Aimee Parker MRN: [id number] Date of Birth: 1950/01/21 Referring Provider (SLP): Claudette Laws, MD   Encounter Date: 12/23/2018  End of Session - 12/23/18 1320    Visit Number  5    Number of Visits  17    Date for SLP Re-Evaluation  02/24/19    SLP Start Time  0935    SLP Stop Time   1015    SLP Time Calculation (min)  40 min    Activity Tolerance  Patient tolerated treatment well       Past Medical History:  Diagnosis Date  . Diabetes mellitus without complication (HCC)   . Hypertension   . Vitamin D deficiency     Past Surgical History:  Procedure Laterality Date  . EYE SURGERY     Left eye surgery  . PARS PLANA VITRECTOMY Right 05/13/2017   Procedure: PARS PLANA VITRECTOMY 25 GAUGE FOR ENDOPHTHALMITIS;  Surgeon: Edmon Crape, MD;  Location: [facility] OR;  Service: Ophthalmology;  Laterality: Right;    There were no vitals filed for this visit.  Subjective Assessment - 12/23/18 0932    Subjective  Pt arrives with husband today.    Patient is accompained by:  Interpreter;Family member   husband, and Marcial Pacas #[id number] [employer]   Currently in Pain?  No/denies            ADULT SLP TREATMENT - 12/23/18 0935      General Information   Behavior/Cognition  Alert;Cooperative;Pleasant mood      Treatment Provided   Treatment provided  Cognitive-Linquistic      Cognitive-Linquistic Treatment   Treatment focused on  Cognition;Patient/family/caregiver education    Skilled Treatment  Pt husband tells SLP (Agrees with pt) that the sign made last time has been helpful for pt remembering to take insulin prior to eating. Husband is supervising pt taking her meds. SLP asked pt and husband directly about any other situations where pt is not recalling things.  Husband stated pt short term memory is still affected and she is forgetting things happening in the last few days. SLP suggested to pt/husband that pt could keep journal about events of each day - "She does not remember trivial things - like if she went to bed." SLP asked pt if she was bothered by not remembering things like this. Pt and husband deny pt is doing anything dangerous at home due to decr'd memory. Daughter is cooking - pt acknowledges she might forget things about cooking. Pt uses stove to rewarm food. SLP suggested pt use microwave to rewarm food and/or husband verify pt turning stove off.  Husband and pt demo understanding of this. SLP gave pt scenario of work involving short term memory and pt stated she would be successful with recall. SLP inquired what pt might do if she were  not completely sure she would recall. SLP introduced/reviewed memory strategies for pt/husband. SLP reiterated that pt should make sure she is able to perform her dutues at work physically AND cognitively and that husband could assist pt in making that decision. SLP told pt that the MD would have to tell her it was cleared for her to return to work - Dr. Carlis Abbott from [facility]. SLP showed pt picture and pt showed SLP an appointment card for that practice for 01-04-19. SLP  told pt/husband to return next session with other ideas of how SLP can assist with memory and cognition.       Assessment / Recommendations / Plan   Plan  Continue with current plan of care      Progression Toward Goals   Progression toward goals  Progressing toward goals       SLP Education - 12/23/18 1319    Education Details  taking notes at work would be helpful when/if pt returns to work    [employer]) Educated  Patient;Spouse    Methods  Explanation    Comprehension  Verbalized understanding       SLP Short Term Goals - 12/23/18 1320      SLP [place] #1   Title  pt will demo selective attention for  therapy tasks for 10 minutes x3 in min noisy environment x3 sessions    Time  2    Period  Weeks    Status  On-going      SLP SHORT TERM GOAL #2   Title  pt will demo emermgent awareness in simple cognitive communication tasks with mod A occasionally x3 sessions    Time  2    Period  Weeks    Status  On-going      SLP SHORT TERM GOAL #3   Title  Pt will establish a memory compensation system and bring with her to 3 therapy sessions.    Time  2    Period  Weeks    Status  New       SLP Long Term Goals - 12/23/18 1321      SLP LONG TERM GOAL #1   Title  pt will demo emermgent awareness in simple cognitive communication tasks with min A rarely x3 sessions    Time  6    Period  Weeks   or 17 sessions, for all LTGs   Status  On-going      SLP LONG TERM GOAL #2   Title  pt will demo 25 minutes selective attention for mod complex therapy tasks in mod noisy environment x 3 sessions    Time  6    Period  Weeks    Status  On-going      SLP LONG TERM GOAL #3   Title  pt will successfully use memory system/strategies to access information about appointments, daily tasks or planned activities  x3 sessions    Time  6    Period  Weeks    Status  On-going      SLP LONG TERM GOAL #4   Title  Pt will report losing/misplacing items less than 2 times per week.    Time  6    Period  Weeks    Status  On-going      SLP LONG TERM GOAL #5   Title  Pt will use compensations for attention/executive function (checklists, timers, pre-planning) to complete tasks at home x5 per pt or spouse report    Time  7    Period  Weeks    Status  New       Plan - 12/23/18 1320    Clinical Impression Statement  Pt is a [place] female requiring [place] interpreter for [place]. She presents today with cognitive communication deficits in attention, memory, executive function and awareness. Pt cont under 24-7 supervision currently. See "skilled treatment" for details; Cognitive Linguistic Quick Test  completed today. SLP educated pt and spouse re: use of memory compensations and  aids. I recommend skilled ST to address the above deficits in order to improve pt's independence and quality of life.    Speech Therapy Frequency  2x / week    Duration  --   8 weeks or 17 sessions   Treatment/Interventions  Cognitive reorganization;Internal/external aids;Patient/family education;Compensatory strategies;SLP instruction and feedback;Functional tasks    Potential to Achieve Goals  Good    Potential Considerations  Severity of impairments    Consulted and Agree with Plan of Care  Patient;Family member/caregiver    Family Member Consulted  husband       Patient will benefit from skilled therapeutic intervention in order to improve the following deficits and impairments:   Cognitive communication deficit    Problem List Patient Active Problem List   Diagnosis Date Noted  . DM (diabetes mellitus), type 2 (HCC) 11/15/2018  . Embolic stroke (HCC) 11/15/2018  . Urinary tract infection without hematuria   . E coli bacteremia 11/08/2018  . Cerebral embolism with cerebral infarction 11/07/2018  . Pneumonia   . Pyelonephritis   . Endotracheal tube present   . Septic shock (HCC)   . Sepsis (HCC) 11/01/2018  . Acute respiratory failure (HCC) 11/01/2018  . Endophthalmitis, acute 05/13/2017  . Healthcare maintenance 09/09/2016  . DM (diabetes mellitus) type 2, uncontrolled, with ketoacidosis (HCC) 05/17/2014  . Heart murmur 12/14/2013  . Cough 07/06/2013  . Hypertriglyceridemia 07/06/2013  . Type 2 diabetes mellitus without complication, with long-term current use of insulin (HCC) 07/06/2013  . Essential hypertension, benign 12/22/2012  . Hyperglycemia 11/17/2012  . Hypertension, uncontrolled 11/17/2012    SCHINKE,CARL ,MS, CCC-SLP  12/23/2018, 1:22 PM  [facility] [address] [place], [place], [address] Phone: [phone]    Fax:  [phone]   Name: Aggie MoatsMai Thi Parker MRN: [id number] Date of Birth: December 12, 1950

## 2018-12-26 ENCOUNTER — Other Ambulatory Visit: Payer: Self-pay

## 2018-12-26 ENCOUNTER — Encounter: Payer: Self-pay | Admitting: Nurse Practitioner

## 2018-12-26 ENCOUNTER — Ambulatory Visit: Payer: Medicare Other | Attending: Nurse Practitioner | Admitting: Nurse Practitioner

## 2018-12-26 DIAGNOSIS — I69354 Hemiplegia and hemiparesis following cerebral infarction affecting left non-dominant side: Secondary | ICD-10-CM

## 2018-12-26 DIAGNOSIS — R531 Weakness: Secondary | ICD-10-CM

## 2018-12-26 DIAGNOSIS — I63442 Cerebral infarction due to embolism of left cerebellar artery: Secondary | ICD-10-CM

## 2018-12-26 NOTE — Progress Notes (Signed)
Virtual Visit via Telephone Note Due to national recommendations of social distancing due to COVID 19, telehealth visit is felt to be most appropriate for this patient at this time.  I discussed the limitations, risks, security and privacy concerns of performing an evaluation and management service by telephone and the availability of in person appointments. I also discussed with the patient that there may be a patient responsible charge related to this service. The patient expressed understanding and agreed to proceed.    I connected with Aimee Parker on 12/26/18  at   9:30 AM EST  EDT by telephone and verified that I am speaking with the correct person using two identifiers.   Consent I discussed the limitations, risks, security and privacy concerns of performing an evaluation and management service by telephone and the availability of in person appointments. I also discussed with the patient that there may be a patient responsible charge related to this service. The patient expressed understanding and agreed to proceed.   Location of Patient: Private Residence   Location of Provider: [facility]    Persons participating in Telemedicine visit: Bertram Denver FNP-BC YY [place] CMA [address]  Interpreter ID Bonita Quin [id number]   History of Present Illness: Telemedicine visit for: HFU  Admitted on 11-01-2018. Hospital course was complicated by acute resp failure with hypoxia requiring BIPAP, UTI with sepsis and Afib with RVR(resolved on DC) and embolic/ischemic stroke (TEE done revealing EF of 55 to 60% and no thrombus, mass or PFO/ASD) Admitted to IP rehab after dc from hospital on 11-3 and dc'd home with OP rehab on 11-22-2018. Still receiving physical therapy. Using cane to assist with mobility due to residual left sided weakness. Her daughter is assisting her with ADLs. I offered PCS services however Aimee Parker declined today.    She has a blood pressure  device. Last reading 134/70. Does not check frequently. Blood glucose reading this morning 84 usually running 100-110. She denies any symptoms of hypoglycemia. A1c due in February. She has not yet followed up with Urology for left hydronephrosis due to pyelonephritis which she was treated for while IP.   Today she continues on metoprolol BID and Eliquis. Amlodipine 10 mg, lisinopril 40 mg and HCTZ 25 mg were dc'd on discharge from IP as well as glimiperide and janumet. She was started on Tradjenta 5mg  daily and Lantus 10 units daily upon Discharge from IP rehab.  Lab Results  Component Value Date   HGBA1C 8.0 (H) 11/14/2018   BP Readings from Last 3 Encounters:  01/04/19 (!) 144/72  12/30/18 128/63  12/27/18 130/68   Past Medical History:  Diagnosis Date  . Diabetes mellitus without complication (HCC)   . Hypertension   . Vitamin D deficiency     Past Surgical History:  Procedure Laterality Date  . EYE SURGERY     Left eye surgery  . PARS PLANA VITRECTOMY Right 05/13/2017   Procedure: PARS PLANA VITRECTOMY 25 GAUGE FOR ENDOPHTHALMITIS;  Surgeon: 07/13/2017, MD;  Location: [facility] OR;  Service: Ophthalmology;  Laterality: Right;    Family History  Problem Relation Age of Onset  . Hypertension Mother   . Hypertension Father     Social History   Socioeconomic History  . Marital status: Married    Spouse name: Not on file  . Number of children: Not on file  . Years of education: Not on file  . Highest education level: Not on file  Occupational History  .  Not on file  Tobacco Use  . Smoking status: Never Smoker  . Smokeless tobacco: Never Used  Substance and Sexual Activity  . Alcohol use: No  . Drug use: No  . Sexual activity: Not Currently  Other Topics Concern  . Not on file  Social History Narrative  . Not on file   Social Determinants of Health   Financial Resource Strain: Low Risk   . Difficulty of Paying Living Expenses: Not very hard  Food Insecurity:   .  Worried About Charity fundraiser in the Last Year: Not on file  . Ran Out of Food in the Last Year: Not on file  Transportation Needs:   . Lack of Transportation (Medical): Not on file  . Lack of Transportation (Non-Medical): Not on file  Physical Activity:   . Days of Exercise per Week: Not on file  . Minutes of Exercise per Session: Not on file  Stress:   . Feeling of Stress : Not on file  Social Connections:   . Frequency of Communication with Friends and Family: Not on file  . Frequency of Social Gatherings with Friends and Family: Not on file  . Attends Religious Services: Not on file  . Active Member of Clubs or Organizations: Not on file  . Attends Archivist Meetings: Not on file  . Marital Status: Not on file     Observations/Objective: Awake, alert and oriented x 3   Review of Systems  Constitutional: Negative for fever, malaise/fatigue and weight loss.  HENT: Negative.  Negative for nosebleeds.   Eyes: Negative.  Negative for blurred vision, double vision and photophobia.  Respiratory: Negative.  Negative for cough and shortness of breath.   Cardiovascular: Negative.  Negative for chest pain, palpitations and leg swelling.  Gastrointestinal: Negative.  Negative for heartburn, nausea and vomiting.  Musculoskeletal: Negative.  Negative for myalgias.  Neurological: Positive for weakness (left sided). Negative for dizziness, focal weakness, seizures and headaches.  Psychiatric/Behavioral: Positive for memory loss. Negative for suicidal ideas.    Assessment and Plan: Aimee Parker was seen today for hospitalization follow-up.  Diagnoses and all orders for this visit:  Cerebrovascular accident (CVA) due to embolism of left cerebellar artery [facility]) -     Ambulatory referral to Neurology   Will refer to Neurology regarding time frame for Eliquis.   Follow Up Instructions Return in about 2 months (around 02/26/2019).     I discussed the assessment and treatment plan  with the patient. The patient was provided an opportunity to ask questions and all were answered. The patient agreed with the plan and demonstrated an understanding of the instructions.   The patient was advised to call back or seek an in-person evaluation if the symptoms worsen or if the condition fails to improve as anticipated.  I provided  minutes of non-face-to-face time during this encounter including median intraservice time, reviewing previous notes, labs, imaging, medications and explaining diagnosis and management.  Gildardo Pounds, FNP-BC

## 2018-12-27 ENCOUNTER — Ambulatory Visit: Payer: Medicare Other

## 2018-12-27 VITALS — BP 130/68

## 2018-12-27 DIAGNOSIS — R41841 Cognitive communication deficit: Secondary | ICD-10-CM

## 2018-12-27 DIAGNOSIS — R2689 Other abnormalities of gait and mobility: Secondary | ICD-10-CM

## 2018-12-27 DIAGNOSIS — M6281 Muscle weakness (generalized): Secondary | ICD-10-CM

## 2018-12-27 NOTE — Patient Instructions (Signed)
Access Code: [email or web address]  URL: [email or web address]  Date: 12/27/2018  Prepared by: Cherly Anderson   Exercises Walking March - 3 reps - 1 sets - 2x daily - 7x weekly Side Stepping with Counter Support - 3 reps - 1 sets - 2x daily - 7x weekly

## 2018-12-27 NOTE — Therapy (Signed)
[facility] [address] [place], [place], [address] Phone: [phone]   Fax:  [phone]  Physical Therapy Treatment  Patient Details  Name: Aimee Parker MRN: [id number] Date of Birth: [birth year] Referring Provider (PT): Claudette Laws, MD   Encounter Date: 12/27/2018  PT End of Session - 12/27/18 0848    Visit Number  2    Number of Visits  17    Date for PT Re-Evaluation  02/10/19    Authorization Type  [employer] Medicare/medicaid (10th visit progress note needed)    PT Start Time  0843    PT Stop Time  0928    PT Time Calculation (min)  45 min    Activity Tolerance  Patient tolerated treatment well    Behavior During Therapy  [facility] for tasks assessed/performed       Past Medical History:  Diagnosis Date  . Diabetes mellitus without complication (HCC)   . Hypertension   . Vitamin D deficiency     Past Surgical History:  Procedure Laterality Date  . EYE SURGERY     Left eye surgery  . PARS PLANA VITRECTOMY Right 05/13/2017   Procedure: PARS PLANA VITRECTOMY 25 GAUGE FOR ENDOPHTHALMITIS;  Surgeon: Edmon Crape, MD;  Location: [facility] OR;  Service: Ophthalmology;  Laterality: Right;    Vitals:   12/27/18 0850 12/27/18 0851  BP: 132/70 130/68    Subjective Assessment - 12/27/18 0845    Subjective  Pt reports that she has been using quad cane at home. Reports that when she gets some dizziness at times like room spinning when first stands up and head hurts some. Only lasts a couple seconds. Has been occuring since stroke.    Patient is accompained by:  Family member;Interpreter   [name] (pronounced)   Pertinent History  HTN, DMII    Limitations  Walking;House hold activities    Patient Stated Goals  "I want to be like I was before."    Currently in Pain?  No/denies             Vestibular Assessment - 12/27/18 0855      Oculomotor Exam   Oculomotor Alignment  Normal    Ocular ROM  WFL    Smooth  Pursuits  Intact    Saccades  Hypometric               [facility] Adult PT Treatment/Exercise - 12/27/18 0904      Ambulation/Gait   Ambulation/Gait  Yes    Ambulation/Gait Assistance  5: Supervision    Ambulation/Gait Assistance Details  Pt was given cues to increase step length    Ambulation Distance (Feet)  115 Feet   80' into clinic with [facility]   Assistive device  None    Gait Pattern  Step-through pattern;Decreased step length - right;Decreased step length - left    Ambulation Surface  Level;Indoor      Neuro Re-ed    Neuro Re-ed Details   Weaving in and out of 4 cones x 4 close SBA with verbal cues to try to increase step length as short steps. In // bars: reciprocal steps over 4 cones with verbal cues and demonstration for reciprocal steps with bilateral UE support then 1 UE support x 3 laps each, side stepping over 4 cones with UE support.  Pt reported a little lightheadedness with activities. Marching gait in // bars 6' x 4, tandem gait in // bars with 1 UE support, attempted none but less  stability 6' x 4. Step-ups on 4" step x 10 each leg with UE support with verbal cues to look up in mirror throughout. Pt denied any dizziness with step-ups. Pt instructed if she gets dizzy with activities to try to focus on point or if too bad to pause until clears.              PT Education - 12/27/18 1209    Education Details  Pt given initial HEP.    Person(s) Educated  Patient    Methods  Explanation;Handout    Comprehension  Verbalized understanding;Returned demonstration       PT Short Term Goals - 12/12/18 1335      PT SHORT TERM GOAL #1   Title  Pt will be ind with initial HEP in order to improve balance and functional mobility.  (Target Date: 01/11/19)    Time  4    Period  Weeks    Status  New    Target Date  01/11/19      PT SHORT TERM GOAL #2   Title  Pt will improve BERG balance score to 46/56 in order to indicate decreased fall risk.      PT SHORT TERM GOAL #3    Title  Pt will improve gait speed to >/=2.62 ft/sec with LRAD in order to indicate safe community ambulation.      PT SHORT TERM GOAL #4   Title  Pt will perform 5TSS in </=11 secs without UE support and no reliance of LEs on chair for support to indicate improved functional strength.      PT SHORT TERM GOAL #5   Title  Pt will ambulate x 100' at mod I level in home without AD in order to indicate improved independence in home.      Additional Short Term Goals   Additional Short Term Goals  Yes      PT SHORT TERM GOAL #6   Title  Pt will ambulate x 500' w/ LRAD at S level over unlevel paved surfaces in order to indicate safe community ambulation.        PT Long Term Goals - 12/12/18 1339      PT LONG TERM GOAL #1   Title  Pt will be ind with final HEP in order to indicate decreased fall risk and improved functional mobility (Target Date: 02/10/2019)    Time  8    Period  Weeks    Status  New    Target Date  02/10/19      PT LONG TERM GOAL #2   Title  Pt will improve BERG balance score to >/=50/56 in order to indicate decreased fall risk.      PT LONG TERM GOAL #3   Title  Pt will ambulate at gait speed of </=2.62 ft/sec without AD in order to indicate safe and more independent community ambulation.      PT LONG TERM GOAL #4   Title  Pt will ambulate over varying outdoor surfaces x 1000' without AD at S level in order to indicate more independent community negotiation.      PT LONG TERM GOAL #5   Title  Assess DGI/FGA as able and write appropriate goal.            Plan - 12/27/18 1210    Clinical Impression Statement  Pt was able to progress to gait without AD today at supervision level. Pt does have decreased step length with negotiating around obstacles. Challenged  with increased SLS time activities especially on right with less stability. PT did assess orthostatic BPs and no real change. Visual screen showed slowed saccadic eye movements. Pt's  dizziness/lightheadedness with transient during activities involving more movement today.    Personal Factors and Comorbidities  Comorbidity 2;Comorbidity 3+    Comorbidities  HTN, DMII, CVA    Examination-Activity Limitations  Caring for Others;Stairs;Locomotion Level;Carry    Examination-Participation Restrictions  Community Activity;Medication Management;Laundry    Stability/Clinical Decision Making  Evolving/Moderate complexity    Rehab Potential  Excellent    PT Frequency  2x / week    PT Duration  8 weeks    PT Treatment/Interventions  ADLs/Self Care Home Management;Aquatic Therapy;DME Instruction;Gait training;Stair training;Functional mobility training;Therapeutic activities;Therapeutic exercise;Balance training;Neuromuscular re-education;Cognitive remediation;Patient/family education;Orthotic Fit/Training;Passive range of motion;Vestibular    PT Next Visit Plan  Work on gait without device-obstacles, around and over, work on quality of gait without device, continue with high level balance and strengthening.    Consulted and Agree with Plan of Care  Patient;Other (Comment)   husband/pt through interpretor      Patient will benefit from skilled therapeutic intervention in order to improve the following deficits and impairments:  Abnormal gait, Decreased activity tolerance, Decreased balance, Decreased cognition, Decreased endurance, Decreased knowledge of precautions, Decreased mobility, Decreased strength, Decreased safety awareness, Impaired perceived functional ability, Impaired flexibility, Postural dysfunction  Visit Diagnosis: Muscle weakness (generalized)  Other abnormalities of gait and mobility     Problem List Patient Active Problem List   Diagnosis Date Noted  . DM (diabetes mellitus), type 2 ([place]) 11/15/2018  . Embolic stroke ([place]) 58/85/0277  . Urinary tract infection without hematuria   . E coli bacteremia 11/08/2018  . Cerebral embolism with cerebral infarction  11/07/2018  . Pneumonia   . Pyelonephritis   . Endotracheal tube present   . Septic shock ([place])   . Sepsis ([place]) 11/01/2018  . Acute respiratory failure ([place]) 11/01/2018  . Endophthalmitis, acute 05/13/2017  . Healthcare maintenance 09/09/2016  . DM (diabetes mellitus) type 2, uncontrolled, with ketoacidosis ([place]) 05/17/2014  . Heart murmur 12/14/2013  . Cough 07/06/2013  . Hypertriglyceridemia 07/06/2013  . Type 2 diabetes mellitus without complication, with long-term current use of insulin ([place]) 07/06/2013  . Essential hypertension, benign 12/22/2012  . Hyperglycemia 11/17/2012  . Hypertension, uncontrolled 11/17/2012    Electa Sniff, PT, DPT, NCS 12/27/2018, 12:14 PM  [facility] [address] [place], [place], [address] Phone: [phone]   Fax:  [phone]  Name: Aimee Parker MRN: [id number] Date of Birth: 1950/10/17

## 2018-12-28 NOTE — Therapy (Signed)
[facility] [address] [place], [place], [address] Phone: [phone]   Fax:  [phone]  Speech Language Pathology Treatment  Patient Details  Name: Aimee Parker MRN: [id number] Date of Birth: Dec 10, 1950 Referring Provider (SLP): Alysia Penna, MD   Encounter Date: 12/27/2018  End of Session - 12/28/18 0838    Visit Number  6    Number of Visits  17    Date for SLP Re-Evaluation  02/24/19    SLP Start Time  0934    SLP Stop Time   5093    SLP Time Calculation (min)  41 min    Activity Tolerance  Patient tolerated treatment well       Past Medical History:  Diagnosis Date  . Diabetes mellitus without complication ([place])   . Hypertension   . Vitamin D deficiency     Past Surgical History:  Procedure Laterality Date  . EYE SURGERY     Left eye surgery  . PARS PLANA VITRECTOMY Right 05/13/2017   Procedure: PARS PLANA VITRECTOMY 25 GAUGE FOR ENDOPHTHALMITIS;  Surgeon: Hurman Horn, MD;  Location: [facility];  Service: Ophthalmology;  Laterality: Right;    There were no vitals filed for this visit.  Subjective Assessment - 12/27/18 0940    Subjective  Aimee Parker [phone]) with [employer] interpreting today. Pt arrives with husband.    Currently in Pain?  No/denies            ADULT SLP TREATMENT - 12/28/18 0001      General Information   Behavior/Cognition  Alert;Cooperative;Pleasant mood      Treatment Provided   Treatment provided  Cognitive-Linquistic      Cognitive-Linquistic Treatment   Treatment focused on  Cognition;Patient/family/caregiver education    Skilled Treatment  "I am doing better thanks to your therapy." SLP asked pt what are some examples of things she has not recalled, or forgotten in the last few days. "My husband helps me to remember by writing some things down for me." Pt stated sign for meds. SLP asked pt and husband if there are any lingering deficits to date. Pt responded that  she might forget something (proapective memory), or is misplacing things. SLP suggested pt use timers and alarms, and that she should leave things in the room where they belong, and husband can assist pt with this. SLP used putty as an example. Husband states pt's memory has improved greatly. SLP worked with pt's selective attention  by having her describe mod complex situations while having the radio on. Pt described these situations without error. SLP asked pt if it was harder to focus with radio on, "A little harder because the radio was noisy."       Assessment / Recommendations / [place] with current plan of care      Progression Toward Goals   Progression toward goals  Progressing toward goals       SLP Education - 12/28/18 0838    Education Details  rationale for attention tasks in therapy session    Person(s) Educated  Patient    Methods  Explanation    Comprehension  Verbalized understanding       SLP Short Term Goals - 12/28/18 0839      SLP SHORT TERM GOAL #1   Title  pt will demo selective attention for therapy tasks for 10 minutes x3 in min noisy environment x3 sessions    Baseline  12-27-18  Time  1    Period  Weeks    Status  Partially Met      SLP SHORT TERM GOAL #2   Title  pt will demo emermgent awareness in simple cognitive communication tasks with mod A occasionally x3 sessions    Baseline  12-27-18    Time  1    Period  Weeks    Status  Partially Met      SLP SHORT TERM GOAL #3   Title  Pt will establish a memory compensation system and bring with her to 3 therapy sessions.    Time  1    Period  Weeks    Status  Deferred   pt satisfied with husband as her "memory system"      SLP Long Term Goals - 12/28/18 0839      SLP LONG TERM GOAL #1   Title  pt will demo emermgent awareness in simple cognitive communication tasks with min A rarely x3 sessions    Time  5    Period  Weeks   or 17 sessions, for all LTGs   Status  On-going       SLP LONG TERM GOAL #2   Title  pt will demo 25 minutes selective attention for mod complex therapy tasks in mod noisy environment x 3 sessions    Time  5    Period  Weeks    Status  On-going      SLP LONG TERM GOAL #3   Title  pt will successfully use memory system/strategies to access information about appointments, daily tasks or planned activities  x3 sessions    Time  5    Period  Weeks    Status  On-going      SLP LONG TERM GOAL #4   Title  Pt will report losing/misplacing items less than 2 times per week.    Time  5    Period  Weeks    Status  On-going      SLP LONG TERM GOAL #5   Title  Pt will use compensations for attention/executive function (checklists, timers, pre-planning) to complete tasks at home x5 per pt or spouse report    Time  5    Period  Weeks    Status  New       Plan - 12/28/18 5573    Clinical Impression Statement  Pt is a [place] female requiring [place] interpreter for [place]. She cont to present today with cognitive communication deficits in attention, memory, executive function and awareness. Pt cont under 24-7 supervision currently. Husband reports her memory skills are improving. See "skilled treatment" for details; Cognitive Linguistic Quick Test completed today. SLP educated pt and spouse re: use of memory compensations and aids. I recommend skilled ST to address the above deficits in order to improve pt's independence and quality of life.    Speech Therapy Frequency  2x / week    Duration  --   8 weeks or 17 sessions   Treatment/Interventions  Cognitive reorganization;Internal/external aids;Patient/family education;Compensatory strategies;SLP instruction and feedback;Functional tasks    Potential to Achieve Goals  Good    Potential Considerations  Severity of impairments    Consulted and Agree with Plan of Care  Patient;Family member/caregiver    Family Member Consulted  husband       Patient will benefit from skilled therapeutic intervention  in order to improve the following deficits and impairments:   Cognitive communication deficit    Problem List  Patient Active Problem List   Diagnosis Date Noted  . DM (diabetes mellitus), type 2 ([place]) 11/15/2018  . Embolic stroke ([place]) 20/35/5974  . Urinary tract infection without hematuria   . E coli bacteremia 11/08/2018  . Cerebral embolism with cerebral infarction 11/07/2018  . Pneumonia   . Pyelonephritis   . Endotracheal tube present   . Septic shock ([place])   . Sepsis ([place]) 11/01/2018  . Acute respiratory failure ([place]) 11/01/2018  . Endophthalmitis, acute 05/13/2017  . Healthcare maintenance 09/09/2016  . DM (diabetes mellitus) type 2, uncontrolled, with ketoacidosis ([place]) 05/17/2014  . Heart murmur 12/14/2013  . Cough 07/06/2013  . Hypertriglyceridemia 07/06/2013  . Type 2 diabetes mellitus without complication, with long-term current use of insulin ([place]) 07/06/2013  . Essential hypertension, benign 12/22/2012  . Hyperglycemia 11/17/2012  . Hypertension, uncontrolled 11/17/2012    Kelilah Hebard ,[place], [place]  12/28/2018, 8:40 AM  [facility] [address] [place], [place], [address] Phone: [phone]   Fax:  [phone]   Name: Aimee Parker MRN: [id number] Date of Birth: March 01, 1950

## 2018-12-30 ENCOUNTER — Ambulatory Visit: Payer: Medicare Other

## 2018-12-30 ENCOUNTER — Ambulatory Visit: Payer: Medicare Other | Admitting: Physical Therapy

## 2018-12-30 ENCOUNTER — Encounter: Payer: Self-pay | Admitting: Physical Therapy

## 2018-12-30 ENCOUNTER — Other Ambulatory Visit: Payer: Self-pay

## 2018-12-30 VITALS — BP 128/63 | HR 75

## 2018-12-30 DIAGNOSIS — R41841 Cognitive communication deficit: Secondary | ICD-10-CM

## 2018-12-30 DIAGNOSIS — R2689 Other abnormalities of gait and mobility: Secondary | ICD-10-CM

## 2018-12-30 DIAGNOSIS — R2681 Unsteadiness on feet: Secondary | ICD-10-CM

## 2018-12-30 DIAGNOSIS — M6281 Muscle weakness (generalized): Secondary | ICD-10-CM

## 2018-12-30 NOTE — Therapy (Signed)
[facility] [address] [place], [place], [address] Phone: [phone]   Fax:  [phone]  Physical Therapy Treatment  Patient Details  Name: Aimee Parker MRN: [id number] Date of Birth: 1950-12-27 Referring Provider (PT): Claudette Laws, MD   Encounter Date: 12/30/2018  PT End of Session - 12/30/18 1220    Visit Number  3    Number of Visits  17    Date for PT Re-Evaluation  02/10/19    Authorization Type  [employer] Medicare/medicaid (10th visit progress note needed)    PT Start Time  1020    PT Stop Time  1100    PT Time Calculation (min)  40 min    Equipment Utilized During Treatment  Gait belt    Activity Tolerance  Patient tolerated treatment well    Behavior During Therapy  WFL for tasks assessed/performed       Past Medical History:  Diagnosis Date  . Diabetes mellitus without complication (HCC)   . Hypertension   . Vitamin D deficiency     Past Surgical History:  Procedure Laterality Date  . EYE SURGERY     Left eye surgery  . PARS PLANA VITRECTOMY Right 05/13/2017   Procedure: PARS PLANA VITRECTOMY 25 GAUGE FOR ENDOPHTHALMITIS;  Surgeon: Edmon Crape, MD;  Location: [facility] OR;  Service: Ophthalmology;  Laterality: Right;    Vitals:   12/30/18 1026  BP: 128/63  Pulse: 75    Subjective Assessment - 12/30/18 1023    Subjective  Once in a while she gets really dizzy. Feeling pretty dizzy right now. No falls. Walks at home by holding onto furniture.    Patient is accompained by:  Family member;Interpreter   [name] (pronounced)   Pertinent History  HTN, DMII    Limitations  Walking;House hold activities    Patient Stated Goals  "I want to be like I was before."    Currently in Pain?  No/denies                       [facility] Adult PT Treatment/Exercise - 12/30/18 0001      Ambulation/Gait   Ambulation/Gait  Yes    Ambulation/Gait Assistance  5: Supervision;4: Min guard     Ambulation/Gait Assistance Details  Initially verbal and demonstrative cues for correct cane sequencing with small based quad cane and step through pattern, after initial cueing good carryover throughout 2 laps of gait. x2 laps with no AD with min guard from therapist asking pt to increase her walking speed, perform head nods/turns up and down with no LOB. Needed cues throughout session for increased step length B    Ambulation Distance (Feet)  460 Feet   x2 laps quad cane, x2 laps no AD   Assistive device  None    Gait Pattern  Step-through pattern;Decreased step length - right;Decreased step length - left;Narrow base of support    Ambulation Surface  Level;Indoor        Access Code: [email or web address]  URL: [email or web address]  Date: 12/30/2018  Prepared by: Sherlie Ban   Added new additions to HEP:   Exercises Walking March - 3 reps - 1 sets - 2x daily - 7x weekly Side Stepping with Counter Support - 3 reps - 1 sets - 2x daily - 7x weekly  New:  Tandem Walking with Counter Support - 3 reps - 1 sets - 2x daily - 7x weekl Backward Walking with Counter Support - 3 reps -  1 sets - 2x daily - 7x weekly  Toe Walking with Counter Support - 3 reps - 1 sets - 2x daily - 7x weekly Sit to Stand - 10 reps - 2 sets - 2x daily - 7x weekly, no UE support, focus on slow eccentric control       PT Education - 12/30/18 1220    Education Details  additions to HEP, gait training with small base quad cane    Person(s) Educated  Patient    Methods  Explanation;Demonstration;Handout;Verbal cues    Comprehension  Verbalized understanding;Returned demonstration;Need further instruction;Tactile cues required       PT Short Term Goals - 12/12/18 1335      PT SHORT TERM GOAL #1   Title  Pt will be ind with initial HEP in order to improve balance and functional mobility.  (Target Date: 01/11/19)    Time  4    Period  Weeks    Status  New    Target Date  01/11/19      PT SHORT TERM GOAL  #2   Title  Pt will improve BERG balance score to 46/56 in order to indicate decreased fall risk.      PT SHORT TERM GOAL #3   Title  Pt will improve gait speed to >/=2.62 ft/sec with LRAD in order to indicate safe community ambulation.      PT SHORT TERM GOAL #4   Title  Pt will perform 5TSS in </=11 secs without UE support and no reliance of LEs on chair for support to indicate improved functional strength.      PT SHORT TERM GOAL #5   Title  Pt will ambulate x 100' at mod I level in home without AD in order to indicate improved independence in home.      Additional Short Term Goals   Additional Short Term Goals  Yes      PT SHORT TERM GOAL #6   Title  Pt will ambulate x 500' w/ LRAD at S level over unlevel paved surfaces in order to indicate safe community ambulation.        PT Long Term Goals - 12/12/18 1339      PT LONG TERM GOAL #1   Title  Pt will be ind with final HEP in order to indicate decreased fall risk and improved functional mobility (Target Date: 02/10/2019)    Time  8    Period  Weeks    Status  New    Target Date  02/10/19      PT LONG TERM GOAL #2   Title  Pt will improve BERG balance score to >/=50/56 in order to indicate decreased fall risk.      PT LONG TERM GOAL #3   Title  Pt will ambulate at gait speed of </=2.62 ft/sec without AD in order to indicate safe and more independent community ambulation.      PT LONG TERM GOAL #4   Title  Pt will ambulate over varying outdoor surfaces x 1000' without AD at S level in order to indicate more independent community negotiation.      PT LONG TERM GOAL #5   Title  Assess DGI/FGA as able and write appropriate goal.            Plan - 12/30/18 1226    Clinical Impression Statement  Focus of today's skilled session was gait training with small base quad cane and without an AD, and adding standing balance at countertop  to pt's HEP. Pt needing initial verbal and demonstrative cues for proper step through 2  point gait pattern with small base quad cane - pt with good carryover during 230' bout of gait, however when ambulating out of clinic, pt reverts back to old pattern and needing tactile cues from therapist to perform. Ambulated without AD throughout session with min guard - asking pt to performing dynamic tasks with no LOB. Pt reporting increased dizziness when performing head nods up and down. Will continue to progress towards LTGs.    Personal Factors and Comorbidities  Comorbidity 2;Comorbidity 3+    Comorbidities  HTN, DMII, CVA    Examination-Activity Limitations  Caring for Others;Stairs;Locomotion Level;Carry    Examination-Participation Restrictions  Community Activity;Medication Management;Laundry    Stability/Clinical Decision Making  Evolving/Moderate complexity    Rehab Potential  Excellent    PT Frequency  2x / week    PT Duration  8 weeks    PT Treatment/Interventions  ADLs/Self Care Home Management;Aquatic Therapy;DME Instruction;Gait training;Stair training;Functional mobility training;Therapeutic activities;Therapeutic exercise;Balance training;Neuromuscular re-education;Cognitive remediation;Patient/family education;Orthotic Fit/Training;Passive range of motion;Vestibular    PT Next Visit Plan  Work on gait without device-obstacles, around and over, work on quality of gait without device, continue with high level balance and strengthening.    PT Home Exercise Plan  [email or web address]    Consulted and Agree with Plan of Care  Patient;Other (Comment)   husband/pt through interpretor      Patient will benefit from skilled therapeutic intervention in order to improve the following deficits and impairments:  Abnormal gait, Decreased activity tolerance, Decreased balance, Decreased cognition, Decreased endurance, Decreased knowledge of precautions, Decreased mobility, Decreased strength, Decreased safety awareness, Impaired perceived functional ability, Impaired flexibility, Postural  dysfunction  Visit Diagnosis: Other abnormalities of gait and mobility  Muscle weakness (generalized)  Unsteadiness on feet     Problem List Patient Active Problem List   Diagnosis Date Noted  . DM (diabetes mellitus), type 2 (HCC) 11/15/2018  . Embolic stroke (HCC) 11/15/2018  . Urinary tract infection without hematuria   . E coli bacteremia 11/08/2018  . Cerebral embolism with cerebral infarction 11/07/2018  . Pneumonia   . Pyelonephritis   . Endotracheal tube present   . Septic shock (HCC)   . Sepsis (HCC) 11/01/2018  . Acute respiratory failure (HCC) 11/01/2018  . Endophthalmitis, acute 05/13/2017  . Healthcare maintenance 09/09/2016  . DM (diabetes mellitus) type 2, uncontrolled, with ketoacidosis (HCC) 05/17/2014  . Heart murmur 12/14/2013  . Cough 07/06/2013  . Hypertriglyceridemia 07/06/2013  . Type 2 diabetes mellitus without complication, with long-term current use of insulin (HCC) 07/06/2013  . Essential hypertension, benign 12/22/2012  . Hyperglycemia 11/17/2012  . Hypertension, uncontrolled 11/17/2012    Drake Leachhloe N Elbert Spickler, PT, DPT  12/30/2018, 1:25 PM  [facility] [address] [place], [place], [address] Phone: [phone]   Fax:  [phone]  Name: Aimee Parker MRN: [id number] Date of Birth: April 06, 1950

## 2018-12-30 NOTE — Therapy (Signed)
[facility] [address] [place], [place], [address] Phone: [phone]   Fax:  [phone]  Speech Language Pathology Treatment  Patient Details  Name: Aimee Parker MRN: [id number] Date of Birth: Jun 09, 1950 Referring Provider (SLP): Alysia Penna, MD   Encounter Date: 12/30/2018  End of Session - 12/30/18 1359    Visit Number  7    Number of Visits  17    Date for SLP Re-Evaluation  02/24/19    SLP Start Time  0935    SLP Stop Time   1015    SLP Time Calculation (min)  40 min    Activity Tolerance  Patient tolerated treatment well       Past Medical History:  Diagnosis Date  . Diabetes mellitus without complication ([place])   . Hypertension   . Vitamin D deficiency     Past Surgical History:  Procedure Laterality Date  . EYE SURGERY     Left eye surgery  . PARS PLANA VITRECTOMY Right 05/13/2017   Procedure: PARS PLANA VITRECTOMY 25 GAUGE FOR ENDOPHTHALMITIS;  Surgeon: Hurman Horn, MD;  Location: [facility];  Service: Ophthalmology;  Laterality: Right;    There were no vitals filed for this visit.  Subjective Assessment - 12/30/18 0941    Subjective  Pt arrives with husband.    Patient is accompained by:  Interpreter   han [phone] interpreting today; [place]- husband   Currently in Pain?  No/denies            ADULT SLP TREATMENT - 12/30/18 0943      General Information   Behavior/Cognition  Alert;Cooperative;Pleasant mood      Treatment Provided   Treatment provided  Cognitive-Linquistic      Cognitive-Linquistic Treatment   Treatment focused on  Cognition;Patient/family/caregiver education    Skilled Treatment  SLP practiced with pt using auditory means (no way of interpreting written material). Simple sequences told to SLP with shortened responses (e.g. 2-3 steps instead of 4-5) requiring SLP to ask for follow up questions. Unsure if this is cultural or due to decr in cognition/organization.  SLP asked pt re: totals for coin combinations <$1 with radio playing for selective attentoin -60% success. Pt stated it was not more difficult to complete the task with the radio on because pt was "concentrating very hard on what (SLP) are saying." (interpreter translation). Pt asked for repeats approx 30% of the time. Aware of her errors 1/3. Pt's husband jumped in and answered SLP's question about what pt has next.       Assessment / Recommendations / Plan   Plan  Continue with current plan of care      Progression Toward Goals   Progression toward goals  Progressing toward goals       SLP Education - 12/30/18 1354    Education Details  rationale using radio and for doing attention tasks    Person(s) Educated  Patient    Methods  Explanation    Comprehension  Verbalized understanding       SLP Short Term Goals - 12/30/18 1359      SLP SHORT TERM GOAL #1   Title  pt will demo selective attention for therapy tasks for 10 minutes x3 in min noisy environment x3 sessions    Baseline  12-27-18    Time  --    Period  --    Status  Partially Met      SLP SHORT TERM GOAL #2  Title  pt will demo emermgent awareness in simple cognitive communication tasks with mod A occasionally x3 sessions    Baseline  12-27-18    Time  --    Period  --    Status  Partially Met      SLP SHORT TERM GOAL #3   Title  Pt will establish a memory compensation system and bring with her to 3 therapy sessions.    Time  --    Period  --    Status  Deferred   pt satisfied with husband as her "memory system"      SLP Long Term Goals - 12/30/18 1400      SLP LONG TERM GOAL #1   Title  pt will demo emermgent awareness in simple cognitive communication tasks with min A rarely x3 sessions    Time  5    Period  Weeks   or 17 sessions, for all LTGs   Status  On-going      SLP LONG TERM GOAL #2   Title  pt will demo 25 minutes selective attention for mod complex therapy tasks in mod noisy environment x 3  sessions    Time  5    Period  Weeks    Status  On-going      SLP LONG TERM GOAL #3   Title  pt will successfully use memory system/strategies to access information about appointments, daily tasks or planned activities  x3 sessions    Time  5    Period  Weeks    Status  On-going      SLP LONG TERM GOAL #4   Title  Pt will report losing/misplacing items less than 2 times per week.    Time  5    Period  Weeks    Status  On-going      SLP LONG TERM GOAL #5   Title  Pt will use compensations for attention/executive function (checklists, timers, pre-planning) to complete tasks at home x5 per pt or spouse report    Time  5    Period  Weeks    Status  New       Plan - 12/30/18 1359    Clinical Impression Statement  Pt is a [place] female requiring [place] interpreter for [place]. She cont to present today with cognitive communication deficits in attention, memory, executive function and awareness. Pt cont under 24-7 supervision currently. Husband reports her memory skills are improving. See "skilled treatment" for details; Cognitive Linguistic Quick Test completed today. SLP educated pt and spouse re: use of memory compensations and aids. I recommend skilled ST to address the above deficits in order to improve pt's independence and quality of life.    Speech Therapy Frequency  2x / week    Duration  --   8 weeks or 17 sessions   Treatment/Interventions  Cognitive reorganization;Internal/external aids;Patient/family education;Compensatory strategies;SLP instruction and feedback;Functional tasks    Potential to Achieve Goals  Good    Potential Considerations  Severity of impairments    Consulted and Agree with Plan of Care  Patient;Family member/caregiver    Family Member Consulted  husband       Patient will benefit from skilled therapeutic intervention in order to improve the following deficits and impairments:   Cognitive communication deficit    Problem List Patient Active  Problem List   Diagnosis Date Noted  . DM (diabetes mellitus), type 2 ([place]) 11/15/2018  . Embolic stroke ([place]) 16/96/7893  . Urinary tract  infection without hematuria   . E coli bacteremia 11/08/2018  . Cerebral embolism with cerebral infarction 11/07/2018  . Pneumonia   . Pyelonephritis   . Endotracheal tube present   . Septic shock ([place])   . Sepsis ([place]) 11/01/2018  . Acute respiratory failure ([place]) 11/01/2018  . Endophthalmitis, acute 05/13/2017  . Healthcare maintenance 09/09/2016  . DM (diabetes mellitus) type 2, uncontrolled, with ketoacidosis ([place]) 05/17/2014  . Heart murmur 12/14/2013  . Cough 07/06/2013  . Hypertriglyceridemia 07/06/2013  . Type 2 diabetes mellitus without complication, with long-term current use of insulin ([place]) 07/06/2013  . Essential hypertension, benign 12/22/2012  . Hyperglycemia 11/17/2012  . Hypertension, uncontrolled 11/17/2012    Alyan Hartline ,[place], CCC-SLP.  12/30/2018, 2:01 PM  [facility] [address] [place], [place], [address] Phone: [phone]   Fax:  [phone]   Name: Aimee Parker MRN: [id number] Date of Birth: [birth year]

## 2019-01-02 ENCOUNTER — Ambulatory Visit: Payer: Medicare Other | Admitting: Physical Therapy

## 2019-01-02 ENCOUNTER — Other Ambulatory Visit: Payer: Self-pay

## 2019-01-02 DIAGNOSIS — R2681 Unsteadiness on feet: Secondary | ICD-10-CM

## 2019-01-02 DIAGNOSIS — R41841 Cognitive communication deficit: Secondary | ICD-10-CM | POA: Diagnosis not present

## 2019-01-02 DIAGNOSIS — R2689 Other abnormalities of gait and mobility: Secondary | ICD-10-CM

## 2019-01-02 DIAGNOSIS — R278 Other lack of coordination: Secondary | ICD-10-CM

## 2019-01-02 DIAGNOSIS — M6281 Muscle weakness (generalized): Secondary | ICD-10-CM

## 2019-01-02 NOTE — Patient Instructions (Signed)
Needs to schedule out more ST sessions, needs 2x week for 4 more weeks. Has been working with Glendell Docker. Please try to schedule same time as PT/OT. Thank you!

## 2019-01-02 NOTE — Therapy (Signed)
[facility] [address] [place], [place], [address] Phone: [phone]   Fax:  [phone]  Physical Therapy Treatment  Patient Details  Name: Aimee MoatsMai Thi Parker MRN: [id number] Date of Birth: 03/12/1950 Referring Provider (PT): Claudette LawsAndrew Kirsteins, MD   Encounter Date: 01/02/2019  PT End of Session - 01/02/19 1208    Visit Number  4    Number of Visits  17    Date for PT Re-Evaluation  02/10/19    Authorization Type  UHC Medicare/medicaid (10th visit progress note needed)    PT Start Time  0930    PT Stop Time  1015    PT Time Calculation (min)  45 min    Equipment Utilized During Treatment  Gait belt    Activity Tolerance  Patient tolerated treatment well    Behavior During Therapy  [facility] for tasks assessed/performed       Past Medical History:  Diagnosis Date  . Diabetes mellitus without complication (HCC)   . Hypertension   . Vitamin D deficiency     Past Surgical History:  Procedure Laterality Date  . EYE SURGERY     Left eye surgery  . PARS PLANA VITRECTOMY Right 05/13/2017   Procedure: PARS PLANA VITRECTOMY 25 GAUGE FOR ENDOPHTHALMITIS;  Surgeon: Edmon Crapeankin, Gary A, MD;  [facility];  Service: Ophthalmology;  Laterality: Right;    There were no vitals filed for this visit.  Subjective Assessment - 01/02/19 0937    Subjective  Doing good, no falls. States that exercises are going well.    Patient is accompained by:  Family member;Interpreter   [name] (pronounced)   Pertinent History  HTN, DMII    Limitations  Walking;House hold activities    Patient Stated Goals  "I want to be like I was before."    Currently in Pain?  No/denies                       [facility]ercise - 01/02/19 0953      Ambulation/Gait   Ambulation/Gait  Yes    Ambulation/Gait Assistance  5: Supervision    Ambulation/Gait Assistance Details  cues for larger step length B without AD. Into and out of  session with small base quad cane pt demonstrates smaller/shuffled steps.     Ambulation Distance (Feet)  230 Feet    Assistive device  None    Gait Pattern  Step-through pattern;Decreased step length - right;Decreased step length - left;Narrow base of support    Ambulation Surface  Level;Indoor      Neuro Re-ed    Neuro Re-ed Details   In // bars, On blue foam: SLS steps B to 4" step no UE support 1 x  10 reps, progressing to turning 4" step on taller side for higher surface 1 x 10 reps - pt with occasional leaning towards R side. Semi-circle taps to 3 cones 1 x 10 reps standing on RLE for increased hip/knee flexion on L, 1 x 10 reps on LLE for SLS pt with increased difficulty performing on L and needed UE support. Stepping over and back black foam beam on floor with red circle on floor for visual cueing for increased step length - stepping over with RLE 1 x 10 reps.       Exercises   Exercises  Knee/Hip      Knee/Hip Exercises: Standing   Forward Step Up  Both;1 set;10 reps;Hand Hold: 0;Step Height: 4";Other (comment)  cues to spot an object to prevent dizziness         Balance Exercises - 01/02/19 1006      Balance Exercises: Standing   Stepping Strategy  Anterior;Posterior;Foam/compliant surface;Other reps (comment)   on blue foam beam 1 x 15 reps each   Rockerboard  --          PT Short Term Goals - 12/12/18 1335      PT SHORT TERM GOAL #1   Title  Pt will be ind with initial HEP in order to improve balance and functional mobility.  (Target Date: 01/11/19)    Time  4    Period  Weeks    Status  New    Target Date  01/11/19      PT SHORT TERM GOAL #2   Title  Pt will improve BERG balance score to 46/56 in order to indicate decreased fall risk.      PT SHORT TERM GOAL #3   Title  Pt will improve gait speed to >/=2.62 ft/sec with LRAD in order to indicate safe community ambulation.      PT SHORT TERM GOAL #4   Title  Pt will perform 5TSS in </=11 secs without UE  support and no reliance of LEs on chair for support to indicate improved functional strength.      PT SHORT TERM GOAL #5   Title  Pt will ambulate x 100' at mod I level in home without AD in order to indicate improved independence in home.      Additional Short Term Goals   Additional Short Term Goals  Yes      PT SHORT TERM GOAL #6   Title  Pt will ambulate x 500' w/ LRAD at S level over unlevel paved surfaces in order to indicate safe community ambulation.        PT Long Term Goals - 12/12/18 1339      PT LONG TERM GOAL #1   Title  Pt will be ind with final HEP in order to indicate decreased fall risk and improved functional mobility (Target Date: 02/10/2019)    Time  8    Period  Weeks    Status  New    Target Date  02/10/19      PT LONG TERM GOAL #2   Title  Pt will improve BERG balance score to >/=50/56 in order to indicate decreased fall risk.      PT LONG TERM GOAL #3   Title  Pt will ambulate at gait speed of </=2.62 ft/sec without AD in order to indicate safe and more independent community ambulation.      PT LONG TERM GOAL #4   Title  Pt will ambulate over varying outdoor surfaces x 1000' without AD at S level in order to indicate more independent community negotiation.      PT LONG TERM GOAL #5   Title  Assess DGI/FGA as able and write appropriate goal.            Plan - 01/02/19 1224    Clinical Impression Statement  [employer] interpreter used during session. Focus of today's session was gait training with no AD and dynamic balance activities for SLS and on compliant surfaces. Pt does best with demonstrative/visual cues on how to perform exercises. Pt able to demonstrate increased step length with initial verbal cue, however poor carryover at end of session when pt ambulates out of clinic with small base quad cane (for community distances).  Pt with increased difficulty with SLS on L, needing min guard. Will continue to progress towards LTGs.    Personal Factors  and Comorbidities  Comorbidity 2;Comorbidity 3+    Comorbidities  HTN, DMII, CVA    Examination-Activity Limitations  Caring for Others;Stairs;Locomotion Level;Carry    Examination-Participation Restrictions  Community Activity;Medication Management;Laundry    Stability/Clinical Decision Making  Evolving/Moderate complexity    Rehab Potential  Excellent    PT Frequency  2x / week    PT Duration  8 weeks    PT Treatment/Interventions  ADLs/Self Care Home Management;Aquatic Therapy;DME Instruction;Gait training;Stair training;Functional mobility training;Therapeutic activities;Therapeutic exercise;Balance training;Neuromuscular re-education;Cognitive remediation;Patient/family education;Orthotic Fit/Training;Passive range of motion;Vestibular    PT Next Visit Plan  Work on gait without device-obstacles, around and over, work on quality of gait without device, continue with high level balance and strengthening.    PT Home Exercise Plan  [id number]    Consulted and Agree with Plan of Care  Patient;Other (Comment)   husband/pt through interpretor      Patient will benefit from skilled therapeutic intervention in order to improve the following deficits and impairments:  Abnormal gait, Decreased activity tolerance, Decreased balance, Decreased cognition, Decreased endurance, Decreased knowledge of precautions, Decreased mobility, Decreased strength, Decreased safety awareness, Impaired perceived functional ability, Impaired flexibility, Postural dysfunction  Visit Diagnosis: Other abnormalities of gait and mobility  Muscle weakness (generalized)  Unsteadiness on feet  Other lack of coordination     Problem List Patient Active Problem List   Diagnosis Date Noted  . DM (diabetes mellitus), type 2 ([place]) 11/15/2018  . Embolic stroke ([place]) 42/59/5638  . Urinary tract infection without hematuria   . E coli bacteremia 11/08/2018  . Cerebral embolism with cerebral infarction 11/07/2018  .  Pneumonia   . Pyelonephritis   . Endotracheal tube present   . Septic shock ([place])   . Sepsis ([place]) 11/01/2018  . Acute respiratory failure ([place]) 11/01/2018  . Endophthalmitis, acute 05/13/2017  . Healthcare maintenance 09/09/2016  . DM (diabetes mellitus) type 2, uncontrolled, with ketoacidosis ([place]) 05/17/2014  . Heart murmur 12/14/2013  . Cough 07/06/2013  . Hypertriglyceridemia 07/06/2013  . Type 2 diabetes mellitus without complication, with long-term current use of insulin ([place]) 07/06/2013  . Essential hypertension, benign 12/22/2012  . Hyperglycemia 11/17/2012  . Hypertension, uncontrolled 11/17/2012    Arliss Journey, PT, DPT  01/02/2019, 12:27 PM  [place] [address] [place], [place], [address] Phone: [phone]   Fax:  [phone]  Name: Keaira Whitehurst MRN: [id number] Date of Birth: Jun 22, 1950

## 2019-01-04 ENCOUNTER — Other Ambulatory Visit: Payer: Self-pay

## 2019-01-04 ENCOUNTER — Telehealth: Payer: Self-pay | Admitting: Physical Medicine and Rehabilitation

## 2019-01-04 ENCOUNTER — Encounter: Payer: Self-pay | Admitting: Physical Medicine and Rehabilitation

## 2019-01-04 ENCOUNTER — Encounter
Payer: Medicare Other | Attending: Physical Medicine and Rehabilitation | Admitting: Physical Medicine and Rehabilitation

## 2019-01-04 VITALS — BP 144/72 | HR 86 | Ht 62.0 in | Wt 139.0 lb

## 2019-01-04 DIAGNOSIS — I639 Cerebral infarction, unspecified: Secondary | ICD-10-CM | POA: Insufficient documentation

## 2019-01-04 DIAGNOSIS — F5101 Primary insomnia: Secondary | ICD-10-CM | POA: Diagnosis present

## 2019-01-04 DIAGNOSIS — I63119 Cerebral infarction due to embolism of unspecified vertebral artery: Secondary | ICD-10-CM | POA: Diagnosis not present

## 2019-01-04 NOTE — Progress Notes (Addendum)
Subjective:    Patient ID: Glenna Fellows, female    DOB: 1950/02/23, 68 y.o.   MRN: [id number]  HPI  Mrs. Leckey presents for follow-up of her ischemic stroke.  She has been doing very well. She has transitioned to outpatient therapy. She walks frequently around her home for 15 minutes at a time. 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 She requests a work note.    Pain Inventory Average Pain 0 Pain Right Now 0 My pain is no pain  In the last 24 hours, has pain interfered with the following? General activity 0 Relation with others 0 Enjoyment of life 0 What TIME of day is your pain at its worst? no pain Sleep (in general) Good  Pain is worse with: no pain Pain improves with: no pain Relief from Meds: no pain  Mobility walk with assistance use a cane  Function retired  Neuro/Psych No problems in this area  Prior Studies Any changes since last visit?  no  Physicians involved in your care Any changes since last visit?  no   Family History  Problem Relation Age of Onset  . Hypertension Mother   . Hypertension Father    Social History   Socioeconomic History  . Marital status: Married    Spouse name: Not on file  . Number of children: Not on file  . Years of education: Not on file  . Highest education level: Not on file  Occupational History  . Not on file  Tobacco Use  . Smoking status: Never Smoker  . Smokeless tobacco: Never Used  Substance and Sexual Activity  . Alcohol use: No  . Drug use: No  . Sexual activity: Not Currently  Other Topics Concern  . Not on file  Social History Narrative  . Not on file   Social Determinants of Health   Financial Resource Strain: Low Risk   . Difficulty of Paying Living Expenses: Not very hard  Food Insecurity:   . Worried About Charity fundraiser in the Last Year: Not on file  . Ran Out of Food in the Last Year: Not on file  Transportation Needs:   .  Lack of Transportation (Medical): Not on file  . Lack of Transportation (Non-Medical): Not on file  Physical Activity:   . Days of Exercise per Week: Not on file  . Minutes of Exercise per Session: Not on file  Stress:   . Feeling of Stress : Not on file  Social Connections:   . Frequency of Communication with Friends and Family: Not on file  . Frequency of Social Gatherings with Friends and Family: Not on file  . Attends Religious Services: Not on file  . Active Member of Clubs or Organizations: Not on file  . Attends Archivist Meetings: Not on file  . Marital Status: Not on file   Past Surgical History:  Procedure Laterality Date  . EYE SURGERY     Left eye surgery  . PARS PLANA VITRECTOMY Right 05/13/2017   Procedure: PARS PLANA VITRECTOMY 25 GAUGE FOR ENDOPHTHALMITIS;  Surgeon: Hurman Horn, MD;  Location: [facility];  Service: Ophthalmology;  Laterality: Right;   Past Medical History:  Diagnosis Date  . Diabetes mellitus without complication ([place])   . Hypertension   . Vitamin D deficiency    Ht 5\' 2"  (1.575 m)   Wt 139 lb (63 kg)   BMI 25.42 kg/m  Opioid Risk Score:   Fall Risk Score:  `1  Depression screen PHQ 2/9  Depression screen [facility] 2/9 12/05/2018 01/17/2018 04/08/2017 09/09/2016 02/26/2016 12/19/2015 05/23/2015  Decreased Interest 0 0 0 0 2 0 0  Down, Depressed, Hopeless 0 0 0 0 0 0 0  PHQ - 2 Score 0 0 0 0 2 0 0  Altered sleeping 2 0 - - 0 - -  Tired, decreased energy 0 2 - - 1 - -  Change in appetite 0 0 - - 1 - -  Feeling bad or failure about yourself  0 0 - - 0 - -  Trouble concentrating 0 0 - - 0 - -  Moving slowly or fidgety/restless 0 0 - - 0 - -  Suicidal thoughts 0 0 - - 0 - -  PHQ-9 Score 2 2 - - 4 - -    Review of Systems  Constitutional: Negative.   HENT: Negative.   Eyes: Negative.   Respiratory: Negative.   Cardiovascular: Negative.   Gastrointestinal: Negative.   Endocrine: Negative.   Genitourinary: Negative.   Musculoskeletal:  Negative.   Skin: Negative.   Neurological: Negative.   Hematological: Negative.   Psychiatric/Behavioral: Negative.   All other systems reviewed and are negative.      Objective:   Physical Exam Gen: no distress, normal appearing HEENT: oral mucosa pink and moist, NCAT Cardio: Reg rate Chest: normal effort, normal rate of breathing Abd: soft, non-distended Ext: no edema Skin: intact Neuro: AOx3. Follows commands.  Musculoskeletal: 5/5 strength throughout with the exception of her left side. She has 4+/5 EF, EE, and 4+/5 HF and KE. Functional gait with RW.  Psych: pleasant, normal affect       Assessment & Plan:  Mrs. Zehner is a 68 year old woman who presents for follow-up after CVA. She is doing well with no complaints of falls, pain, depression, or constipation.   To help her sleep better, I recommend chamomile tea at night, no screen time, and melatonin 5mg  tablet, which I sent to her pharmacy last visit.  Continue therapy three times per week to work on regaining left-sided strength and cognitive communication deficit. Educated that she should work on her home exercise program on days when she does not have therapy.   Continue walking multiple times per day. Increase from 15 minute to 20 minute sessions. Eventually may benefit from [facility] to improve balance.   Provided with note to refrain from working during her recovery.   10 minutes of face to face patient care time were spent during this visit. All questions were encouraged and answered. Follow up with me in 2 months to assess progress with therapy and to monitor insomnia and balance.   Visit performed with aid of interpreter.

## 2019-01-05 NOTE — Telephone Encounter (Signed)
This was not an actual telephone encounter. Entered in error.

## 2019-01-10 ENCOUNTER — Ambulatory Visit: Payer: Medicare Other | Admitting: Occupational Therapy

## 2019-01-10 ENCOUNTER — Other Ambulatory Visit: Payer: Self-pay

## 2019-01-10 DIAGNOSIS — I69318 Other symptoms and signs involving cognitive functions following cerebral infarction: Secondary | ICD-10-CM

## 2019-01-10 DIAGNOSIS — R41841 Cognitive communication deficit: Secondary | ICD-10-CM | POA: Diagnosis not present

## 2019-01-10 DIAGNOSIS — M6281 Muscle weakness (generalized): Secondary | ICD-10-CM

## 2019-01-10 DIAGNOSIS — R278 Other lack of coordination: Secondary | ICD-10-CM

## 2019-01-10 DIAGNOSIS — R2681 Unsteadiness on feet: Secondary | ICD-10-CM

## 2019-01-10 NOTE — Therapy (Signed)
[facility] [address] [place], [place], [address] Phone: [phone]   Fax:  [phone]  Occupational Therapy Treatment  Patient Details  Name: Aimee MoatsMai Thi Parker MRN: [id number] Date of Birth: 11/13/1950 Referring Provider (OT): Dr. Claudette LawsAndrew Kirsteins   Encounter Date: 01/10/2019  OT End of Session - 01/10/19 0807    Visit Number  4    Number of Visits  17    Date for OT Re-Evaluation  02/06/19    Authorization Type  [employer] MCR primary, MCD secondary    Authorization - Visit Number  4    Authorization - Number of Visits  10    OT Start Time  0804    OT Stop Time  0844    OT Time Calculation (min)  40 min    Activity Tolerance  Patient tolerated treatment well    Behavior During Therapy  [facility] for tasks assessed/performed       Past Medical History:  Diagnosis Date  . Diabetes mellitus without complication (HCC)   . Hypertension   . Vitamin D deficiency     Past Surgical History:  Procedure Laterality Date  . EYE SURGERY     Left eye surgery  . PARS PLANA VITRECTOMY Right 05/13/2017   Procedure: PARS PLANA VITRECTOMY 25 GAUGE FOR ENDOPHTHALMITIS;  Surgeon: Edmon Crapeankin, Gary A, MD;  [facility];  Service: Ophthalmology;  Laterality: Right;    There were no vitals filed for this visit.  Subjective Assessment - 01/10/19 0806    Currently in Pain?  No/denies            Treatment:supine closed chain shoulder flexion and chest press, followed by seated midrange closed chain shoulder flexion with ball, min-mod v.c and tactile cues(interpreter via Stratus) Closed chain diagonals with ball, mod facilitation/ v.c (Therapist attempted cane exercises, however pt preformed better with ball, compensation noted with LUE., therapist attempted simple yellow therabnd shoulder abduction however due to motor planning difficulties this was discontinued. Standing to perform functional reaching with RUE to place graded  clothespins on antennae followed by removing with LUE for sustained pinch. Gripper set at level 2 for sustained grip with RUE, min difficulty/ drops. Arm bike x 5 mins level 3 for reciprocal movement and conditioning.                OT Education - 01/10/19 1001    Education Details  supine closed chain shoulder flexion and chest press, followed by seated closed chain shoulder flexion- verbally instructed via interpreter.    Person(s) Educated  Patient    Methods  Explanation;Demonstration;Verbal cues    Comprehension  Verbalized understanding;Returned demonstration;Verbal cues required;Tactile cues required       OT Short Term Goals - 01/10/19 0808      OT SHORT TERM GOAL #1   Title  Independent with HEP for BUE arm strengthening, Rt hand strengthening, and LUE gross motor coordination - extended to 01/20/2019 due to missed visits    Time  4    Period  Weeks    Status  On-going      OT SHORT TERM GOAL #2   Title  Pt to bathe self w/ supervision and A/E prn    Baseline  min assist    Time  4    Period  Weeks    Status  Achieved      OT SHORT TERM GOAL #3   Title  Pt to improve Rt grip strength to 30 lbs for  opening jars/containers    Baseline  22 lbs (Lt = 35 lbs)    Time  4    Period  Weeks    Status  On-going      OT SHORT TERM GOAL #4   Title  Pt to stand to fold towels and wash dishes for 10 minutes w/o rest    Time  4    Period  Weeks    Status  On-going      OT SHORT TERM GOAL #5   Title  Pt to make simple snack/sandwich w/ supervision    Time  4    Period  Weeks    Status  On-going        OT Long Term Goals - 12/07/18 1302      OT LONG TERM GOAL #1   Title  Pt to return to simple cooking tasks at mod I level    Time  8    Period  Weeks    Status  New      OT LONG TERM GOAL #2   Title  Pt to return to light household maintence/cleaning and laundry tasks mod I level    Time  8    Period  Weeks    Status  New      OT LONG TERM GOAL #3    Title  Pt to attend to Rt side and perform tabletop visual scanning without cues    Time  8    Period  Weeks    Status  New      OT LONG TERM GOAL #4   Title  Pt to demo sufficient strength BUE's to put groceries away and retrieve/replace items up to 3 lbs on high shelf    Time  8    Period  Weeks    Status  New            Plan - 01/10/19 2423    Clinical Impression Statement  Pt is progressing towards goals. She continues to demonstrate motor planning difficulties with LUE.    OT Occupational Profile and History  Detailed Assessment- Review of Records and additional review of physical, cognitive, psychosocial history related to current functional performance    Occupational performance deficits (Please refer to evaluation for details):  ADL's;IADL's    Body Structure / Function / Physical Skills  ADL;IADL;Body mechanics;Balance;Mobility;Sensation;Coordination;UE functional use;Decreased knowledge of use of DME;Proprioception    Cognitive Skills  Attention;Memory;Thought;Understand    Rehab Potential  Good    Clinical Decision Making  Several treatment options, min-mod task modification necessary    Comorbidities Affecting Occupational Performance:  May have comorbidities impacting occupational performance    Modification or Assistance to Complete Evaluation   Max significant modification of tasks or assist is necessary to complete   partly due to language barrier   OT Frequency  2x / week    OT Duration  8 weeks   plus eval (however may only need O.T. 6 weeks)   OT Treatment/Interventions  Self-care/ADL training;Therapeutic exercise;Functional Mobility Training;Neuromuscular education;Manual Therapy;Therapeutic activities;DME and/or AE instruction;Cognitive remediation/compensation;Visual/perceptual remediation/compensation;Moist Heat;Passive range of motion;Patient/family education    Plan  simple functional tasks, use [place] interpreter, ball ex supine or seated    Consulted  and Agree with Plan of Care  Patient   via interpreter      Patient will benefit from skilled therapeutic intervention in order to improve the following deficits and impairments:   Body Structure / Function / Physical Skills: ADL, IADL, Body  mechanics, Balance, Mobility, Sensation, Coordination, UE functional use, Decreased knowledge of use of DME, Proprioception Cognitive Skills: Attention, Memory, Thought, Understand     Visit Diagnosis: Other lack of coordination  Other symptoms and signs involving cognitive functions following cerebral infarction  Unsteadiness on feet  Muscle weakness (generalized)    Problem List Patient Active Problem List   Diagnosis Date Noted  . DM (diabetes mellitus), type 2 (HCC) 11/15/2018  . Embolic stroke (HCC) 11/15/2018  . Urinary tract infection without hematuria   . E coli bacteremia 11/08/2018  . Cerebral embolism with cerebral infarction 11/07/2018  . Pneumonia   . Pyelonephritis   . Endotracheal tube present   . Septic shock (HCC)   . Sepsis (HCC) 11/01/2018  . Acute respiratory failure (HCC) 11/01/2018  . Endophthalmitis, acute 05/13/2017  . Healthcare maintenance 09/09/2016  . DM (diabetes mellitus) type 2, uncontrolled, with ketoacidosis (HCC) 05/17/2014  . Heart murmur 12/14/2013  . Cough 07/06/2013  . Hypertriglyceridemia 07/06/2013  . Type 2 diabetes mellitus without complication, with long-term current use of insulin (HCC) 07/06/2013  . Essential hypertension, benign 12/22/2012  . Hyperglycemia 11/17/2012  . Hypertension, uncontrolled 11/17/2012    Josiyah Tozzi 01/10/2019, 10:09 AM Keene Breath, OTR/L [phone] Phone: [phone] 10:10 AM 12/29/20[facility] [address] [place], [place], [address] Phone: [phone]   Fax:  [phone]  Name: Nattaly Yebra MRN: [id number] Date of Birth: 1950-10-02

## 2019-01-12 ENCOUNTER — Other Ambulatory Visit: Payer: Self-pay

## 2019-01-12 ENCOUNTER — Ambulatory Visit: Payer: Medicare Other

## 2019-01-12 DIAGNOSIS — R41841 Cognitive communication deficit: Secondary | ICD-10-CM | POA: Diagnosis not present

## 2019-01-12 NOTE — Patient Instructions (Signed)
Please bring in your blood sugar and blood pressure infomration next visit

## 2019-01-12 NOTE — Therapy (Signed)
[facility] [address] [place], [place], [address] Phone: [phone]   Fax:  [phone]  Speech Language Pathology Treatment  Patient Details  Name: Aimee Parker MRN: [id number] Date of Birth: [birth year] Referring Provider (SLP): Alysia Penna, MD   Encounter Date: 01/12/2019  End of Session - 01/12/19 1103    Visit Number  8    Number of Visits  17    Date for SLP Re-Evaluation  02/24/19    SLP Start Time  0939   waited for [place] interpreter   SLP Stop Time   1016    SLP Time Calculation (min)  37 min    Activity Tolerance  Patient tolerated treatment well       Past Medical History:  Diagnosis Date  . Diabetes mellitus without complication ([facility])   . Hypertension   . Vitamin D deficiency     Past Surgical History:  Procedure Laterality Date  . EYE SURGERY     Left eye surgery  . PARS PLANA VITRECTOMY Right 05/13/2017   Procedure: PARS PLANA VITRECTOMY 25 GAUGE FOR ENDOPHTHALMITIS;  Surgeon: Hurman Horn, MD;  Location: [facility];  Service: Ophthalmology;  Laterality: Right;    There were no vitals filed for this visit.  Subjective Assessment - 01/12/19 0935    Subjective  Pt arrives with husband.    Patient is accompained by:  Family member   Interpreter Evelena Leyden [phone]   Currently in Pain?  No/denies            ADULT SLP TREATMENT - 01/12/19 0947      General Information   Behavior/Cognition  Alert;Cooperative;Pleasant mood      Treatment Provided   Treatment provided  Cognitive-Linquistic      Cognitive-Linquistic Treatment   Treatment focused on  Cognition;Patient/family/caregiver education    Skilled Treatment  Husband conveys to SLP that pt continues to forget things - short term memory. When asked directly, pt agrees. SLP explained through interpreter the use of a diary or journal for pt to track activities she has done each day. Husband states pt has notebook where she tracks  medical info like BP and blood sugar. SLP asked pt to bring this information next time. "She does not remember minor things - like heat up the food."(husband) SLP explained that if pt is having difficulty with these things at home she very likely would have difficulty at work. SLP suggested to patiet that she might need compensate with a small notebook or a checklist to ensure she fulfills all necessary items in each room prior to moving to next room. Pt acknowledged understanding via interpreter. SLP highlilghted that remembering to bring in the medical info sheet/notebook is a good thing to do to practice her memory. SLP asked pt how she would remember to bring in the sheet - told SLP she would write it on the calendar. SLP asked pt how she would remember to put it on the calendar and pt stated she would bring it (asked husband when next apptointment was). SLP specifically asked husband not to assist pt remember to bring in medical info sheet. SLP asked if there was anything happening at home that pt was forgetting that impacted her safety. Pt and husband agree that pt is not unsafe with what she does not remember, it is "little things" like forgetting where the phone is. She has tried to leave the phone in one place or if she is boiling water for tea she  stays at the stove until it is done. SLP asked pt directly to tell her what therapy she has next - pt husband told her. SLP asked pt what she would do if she wanted to look at her therapy schedule and pt stated she would look on the calendar.       Assessment / Recommendations / Plan   Plan  Continue with current plan of care      Progression Toward Goals   Progression toward goals  Progressing toward goals       SLP Education - 01/12/19 1102    Education Details  using compensations for memor at home and at work - pt/husband report pt doing so independently    Person(s) Educated  Patient;Spouse    Methods  Explanation    Comprehension  Verbalized  understanding       SLP Short Term Goals - 12/30/18 1359      SLP SHORT TERM GOAL #1   Title  pt will demo selective attention for therapy tasks for 10 minutes x3 in min noisy environment x3 sessions    Baseline  12-27-18    Time  --    Period  --    Status  Partially Met      SLP SHORT TERM GOAL #2   Title  pt will demo emermgent awareness in simple cognitive communication tasks with mod A occasionally x3 sessions    Baseline  12-27-18    Time  --    Period  --    Status  Partially Met      SLP SHORT TERM GOAL #3   Title  Pt will establish a memory compensation system and bring with her to 3 therapy sessions.    Time  --    Period  --    Status  Deferred   pt satisfied with husband as her "memory system"      SLP Long Term Goals - 01/12/19 1105      SLP LONG TERM GOAL #1   Title  pt will demo emermgent awareness in simple cognitive communication tasks with min A rarely x3 sessions    Time  4    Period  Weeks   or 17 sessions, for all LTGs   Status  On-going      SLP LONG TERM GOAL #2   Title  pt will demo 25 minutes selective attention for mod complex therapy tasks in mod noisy environment x 3 sessions    Time  4    Period  Weeks    Status  On-going      SLP LONG TERM GOAL #3   Title  pt will successfully use memory system/strategies to access information about appointments, daily tasks or planned activities  x3 sessions    Time  4    Period  Weeks    Status  On-going      SLP LONG TERM GOAL #4   Title  Pt will report losing/misplacing items less than 2 times per week.    Time  4    Period  Weeks    Status  On-going      SLP LONG TERM GOAL #5   Title  Pt will use compensations for attention/executive function (checklists, timers, pre-planning) to complete tasks at home x5 per pt or spouse report    Baseline  01-12-19    Time  4    Period  Weeks    Status  On-going  Plan - 01/12/19 1020    Clinical Impression Statement  Pt is a [place] female  requiring [place] interpreter for [place] waited 9 minutes for a [employer] interpreter today. She cont to present today with cognitive communication deficits in attention, memory.Husband states memory is still impaired for short term memory - pt and husband report pt is using compensations independently. See "skilled treatment" for details; I recommend skilled ST to continue at once/week (due to progress reported to SLP) to address the above deficits in order to improve pt's independence and quality of life.    Speech Therapy Frequency  1x /week    Duration  --   8 weeks or 17 sessions   Treatment/Interventions  Cognitive reorganization;Internal/external aids;Patient/family education;Compensatory strategies;SLP instruction and feedback;Functional tasks    Potential to Achieve Goals  Good    Potential Considerations  Severity of impairments    Consulted and Agree with Plan of Care  Patient;Family member/caregiver    Family Member Consulted  husband       Patient will benefit from skilled therapeutic intervention in order to improve the following deficits and impairments:   Cognitive communication deficit    Problem List Patient Active Problem List   Diagnosis Date Noted  . DM (diabetes mellitus), type 2 ([facility] [place]) 11/15/2018  . Embolic stroke ([facility]) 37/09/6436  . Urinary tract infection without hematuria   . E coli bacteremia 11/08/2018  . Cerebral embolism with cerebral infarction 11/07/2018  . Pneumonia   . Pyelonephritis   . Endotracheal tube present   . Septic shock ([facility])   . Sepsis ([place]) 11/01/2018  . Acute respiratory failure ([place] [facility]) 11/01/2018  . Endophthalmitis, acute 05/13/2017  . Healthcare maintenance 09/09/2016  . DM (diabetes mellitus) type 2, uncontrolled, with ketoacidosis ([facility]) 05/17/2014  . Heart murmur 12/14/2013  . Cough 07/06/2013  . Hypertriglyceridemia 07/06/2013  . Type 2 diabetes mellitus without complication, with long-term current use of insulin ([place])  07/06/2013  . Essential hypertension, benign 12/22/2012  . Hyperglycemia 11/17/2012  . Hypertension, uncontrolled 11/17/2012    Selmer Adduci ,[place], [place]    01/12/2019, 11:06 AM  [employer] [address] [place], [place], [address] Phone: [phone]   Fax:  [phone]   Name: Aimee Parker MRN: [id number] Date of Birth: [birth year]

## 2019-01-14 ENCOUNTER — Encounter: Payer: Self-pay | Admitting: Nurse Practitioner

## 2019-01-16 ENCOUNTER — Other Ambulatory Visit: Payer: Self-pay

## 2019-01-16 ENCOUNTER — Ambulatory Visit: Payer: Medicare Other | Attending: Physical Medicine & Rehabilitation | Admitting: Occupational Therapy

## 2019-01-16 ENCOUNTER — Ambulatory Visit: Payer: Medicare Other | Admitting: Physical Therapy

## 2019-01-16 ENCOUNTER — Encounter: Payer: Self-pay | Admitting: Occupational Therapy

## 2019-01-16 ENCOUNTER — Encounter: Payer: Self-pay | Admitting: Physical Therapy

## 2019-01-16 DIAGNOSIS — R41841 Cognitive communication deficit: Secondary | ICD-10-CM | POA: Insufficient documentation

## 2019-01-16 DIAGNOSIS — R2681 Unsteadiness on feet: Secondary | ICD-10-CM | POA: Diagnosis present

## 2019-01-16 DIAGNOSIS — R2689 Other abnormalities of gait and mobility: Secondary | ICD-10-CM | POA: Diagnosis present

## 2019-01-16 DIAGNOSIS — M6281 Muscle weakness (generalized): Secondary | ICD-10-CM | POA: Diagnosis present

## 2019-01-16 DIAGNOSIS — I69318 Other symptoms and signs involving cognitive functions following cerebral infarction: Secondary | ICD-10-CM | POA: Diagnosis present

## 2019-01-16 DIAGNOSIS — R278 Other lack of coordination: Secondary | ICD-10-CM

## 2019-01-16 NOTE — Therapy (Signed)
[facility] [address] [place], [place], [address] Phone: [phone]   Fax:  [phone]  Occupational Therapy Treatment  Patient Details  Name: Aimee Parker MRN: [id number] Date of Birth: 19-Jul-1950 Referring Provider (OT): Dr. Claudette Laws   Encounter Date: 01/16/2019  OT End of Session - 01/16/19 1208    Visit Number  5    Number of Visits  17    Date for OT Re-Evaluation  02/06/19    Authorization Type  [employer] MCR primary, MCD secondary    Authorization - Visit Number  5    Authorization - Number of Visits  10    OT Start Time  [id number]    OT Stop Time  1013    OT Time Calculation (min)  46 min    Activity Tolerance  Patient tolerated treatment well       Past Medical History:  Diagnosis Date  . Diabetes mellitus without complication ([facility])   . Hypertension   . Vitamin D deficiency     Past Surgical History:  Procedure Laterality Date  . EYE SURGERY     Left eye surgery  . PARS PLANA VITRECTOMY Right 05/13/2017   Procedure: PARS PLANA VITRECTOMY 25 GAUGE FOR ENDOPHTHALMITIS;  Surgeon: Edmon Crape, MD;  Location: [facility] OR;  Service: Ophthalmology;  Laterality: Right;    There were no vitals filed for this visit.  Subjective Assessment - 01/16/19 0929    Subjective   That was good (via interpreter on [employer])    Patient is accompanied by:  Family member;Interpreter   interpreter via [employer]   Pertinent History  CVA 10/2018. Admitted for septic shock on 11/01/18 and intubated 11/02/18. MRI showed CVA on 11/06/18.    Limitations  fall risk, needs interpreter    Patient Stated Goals  get balance better    Currently in Pain?  No/denies                   OT Treatments/Exercises (OP) - 01/16/19 0001      ADLs   Home Maintenance  Addressed sweeping floor and using dustpan - pt able to complete with supervision. Also addressed simulated laundry including carrying basket, picking up clothes from  floor, placing in washer then placing in dryer, carrying to table, folding.  Pt able to complete all activities and only required supervision for picking up clothes off floor.  Pt tolerated 15 minutes of standing activity then reported fatigue level of 4/10 stating "its my legs that get tired."        Neurological Re-education Exercises   Other Exercises 1  Gripper on #2 picking up 1 inch blocks with only 2 drops and little difficulty.      Other Exercises 2  Reviewed putty as pt was doing fast lgiht squeezes with putty. Pt wit improved performance after instructiona and practice.  Also utlized red putty with pegs to address R pinch strength.  MMT for BUE's shows 5/5 strength at this time.               OT Education - 01/16/19 1203    Education Details  reinforced how to cmplete putty to ensure full benefit    Person(s) Educated  Patient    Methods  Explanation;Demonstration;Verbal cues    Comprehension  Verbalized understanding;Returned demonstration       OT Short Term Goals - 01/16/19 1204      OT SHORT TERM GOAL #1   Title  Independent  with HEP for BUE arm strengthening, Rt hand strengthening, and LUE gross motor coordination - extended to 01/20/2019 due to missed visits    Time  4    Period  Weeks    Status  On-going      OT SHORT TERM GOAL #2   Title  Pt to bathe self w/ supervision and A/E prn    Baseline  min assist    Time  4    Period  Weeks    Status  Achieved      OT SHORT TERM GOAL #3   Title  Pt to improve Rt grip strength to 30 lbs for opening jars/containers    Baseline  22 lbs (Lt = 35 lbs)    Time  4    Period  Weeks    Status  On-going   01/16/2019  27 pounds     OT SHORT TERM GOAL #4   Title  Pt to stand to fold towels and wash dishes for 10 minutes w/o rest    Time  4    Period  Weeks    Status  Achieved   01/16/2019  15 minutes then rated fatigue 4/10     OT SHORT TERM GOAL #5   Title  Pt to make simple snack/sandwich w/ supervision    Time  4     Period  Weeks    Status  Achieved        OT Long Term Goals - 01/16/19 1205      OT LONG TERM GOAL #1   Title  Pt to return to simple cooking tasks at mod I level    Time  8    Period  Weeks    Status  On-going      OT LONG TERM GOAL #2   Title  Pt to return to light household maintence/cleaning and laundry tasks mod I level    Time  8    Period  Weeks    Status  On-going      OT LONG TERM GOAL #3   Title  Pt to attend to Rt side and perform tabletop visual scanning without cues    Time  8    Period  Weeks    Status  On-going      OT LONG TERM GOAL #4   Title  Pt to demo sufficient strength BUE's to put groceries away and retrieve/replace items up to 3 lbs on high shelf    Time  8    Period  Weeks    Status  On-going            Plan - 01/16/19 1206    Clinical Impression Statement  Pt progressing toward OT goals. Patient with functional strength in BUE's at this time except for grip strength. Pt demonstrating improved motor planning with functional tasks in context.    OT Occupational Profile and History  Detailed Assessment- Review of Records and additional review of physical, cognitive, psychosocial history related to current functional performance    Occupational performance deficits (Please refer to evaluation for details):  ADL's;IADL's    Body Structure / Function / Physical Skills  ADL;IADL;Body mechanics;Balance;Mobility;Sensation;Coordination;UE functional use;Decreased knowledge of use of DME;Proprioception    Cognitive Skills  Attention;Memory;Thought;Understand    Rehab Potential  Good    Clinical Decision Making  Several treatment options, min-mod task modification necessary    Comorbidities Affecting Occupational Performance:  May have comorbidities impacting occupational performance    Modification or  Assistance to Complete Evaluation   Max significant modification of tasks or assist is necessary to complete    OT Frequency  2x / week    OT Duration  8  weeks    OT Treatment/Interventions  Self-care/ADL training;Therapeutic exercise;Functional Mobility Training;Neuromuscular education;Manual Therapy;Therapeutic activities;DME and/or AE instruction;Cognitive remediation/compensation;Visual/perceptual remediation/compensation;Moist Heat;Passive range of motion;Patient/family education    Plan  simple functional tasks, use [place] interpreter, grip strength.  Check sustained overhead functional use and issue some home mgmt tasks pt is safe to complete. Pt is doing some limited cooking, washing dishes. Could do laundry, sweeping as well.    Consulted and Agree with Plan of Care  Patient       Patient will benefit from skilled therapeutic intervention in order to improve the following deficits and impairments:   Body Structure / Function / Physical Skills: ADL, IADL, Body mechanics, Balance, Mobility, Sensation, Coordination, UE functional use, Decreased knowledge of use of DME, Proprioception Cognitive Skills: Attention, Memory, Thought, Understand     Visit Diagnosis: Other lack of coordination  Other symptoms and signs involving cognitive functions following cerebral infarction  Unsteadiness on feet  Muscle weakness (generalized)    Problem List Patient Active Problem List   Diagnosis Date Noted  . DM (diabetes mellitus), type 2 ([facility]) 11/15/2018  . Embolic stroke ([facility]) 11/15/2018  . Urinary tract infection without hematuria   . E coli bacteremia 11/08/2018  . Cerebral embolism with cerebral infarction 11/07/2018  . Pneumonia   . Pyelonephritis   . Endotracheal tube present   . Septic shock ([facility])   . Sepsis ([facility]) 11/01/2018  . Acute respiratory failure ([facility]) 11/01/2018  . Endophthalmitis, acute 05/13/2017  . Healthcare maintenance 09/09/2016  . DM (diabetes mellitus) type 2, uncontrolled, with ketoacidosis ([facility]) 05/17/2014  . Heart murmur 12/14/2013  . Cough 07/06/2013  . Hypertriglyceridemia 07/06/2013  . Type 2 diabetes  mellitus without complication, with long-term current use of insulin ([facility]) 07/06/2013  . Essential hypertension, benign 12/22/2012  . Hyperglycemia 11/17/2012  . Hypertension, uncontrolled 11/17/2012    Norton Pastel, OTR/L 01/16/2019, 12:12 PM  [facility] [address] [place], [place], [address] Phone: [phone]   Fax:  [phone]  Name: Flor Whitacre MRN: [id number] Date of Birth: 03-03-1950

## 2019-01-17 NOTE — Therapy (Signed)
[facility] [address] [place], [place], [address] Phone: [phone]   Fax:  [phone]  Physical Therapy Treatment  Patient Details  Name: Aimee Parker MRN: [id number] Date of Birth: 04/12/1950 Referring Provider (PT): Alysia Penna, MD   Encounter Date: 01/16/2019  PT End of Session - 01/17/19 0923    Visit Number  5    Number of Visits  17    Date for PT Re-Evaluation  02/10/19    Authorization Type  [employer] Medicare/medicaid (10th visit progress note needed)    PT Start Time  1018    PT Stop Time  1100    PT Time Calculation (min)  42 min    Equipment Utilized During Treatment  Gait belt    Activity Tolerance  Patient tolerated treatment well    Behavior During Therapy  WFL for tasks assessed/performed       Past Medical History:  Diagnosis Date  . Diabetes mellitus without complication ([place])   . Hypertension   . Vitamin D deficiency     Past Surgical History:  Procedure Laterality Date  . EYE SURGERY     Left eye surgery  . PARS PLANA VITRECTOMY Right 05/13/2017   Procedure: PARS PLANA VITRECTOMY 25 GAUGE FOR ENDOPHTHALMITIS;  Surgeon: Hurman Horn, MD;  Location: [facility];  Service: Ophthalmology;  Laterality: Right;    There were no vitals filed for this visit.  Subjective Assessment - 01/16/19 1022    Subjective  Doing well, no falls. Exercises are going well at home.    Patient is accompained by:  Family member;Interpreter   [name] (pronounced)   Pertinent History  HTN, DMII    Limitations  Walking;House hold activities    Patient Stated Goals  "I want to be like I was before."    Currently in Pain?  No/denies         [facility] PT Assessment - 01/16/19 1027      Berg Balance Test   Sit to Stand  Able to stand without using hands and stabilize independently    Standing Unsupported  Able to stand safely 2 minutes    Sitting with Back Unsupported but Feet Supported on Floor or Stool  Able to  sit safely and securely 2 minutes    Stand to Sit  Sits safely with minimal use of hands    Transfers  Able to transfer safely, minor use of hands    Standing Unsupported with Eyes Closed  Able to stand 10 seconds safely    Standing Unsupported with Feet Together  Able to place feet together independently and stand 1 minute safely    From Standing, Reach Forward with Outstretched Arm  Can reach forward >12 cm safely (5")    From Standing Position, Pick up Object from Floor  Able to pick up shoe safely and easily    From Standing Position, Turn to Look Behind Over each Shoulder  Looks behind from both sides and weight shifts well    Turn 360 Degrees  Able to turn 360 degrees safely but slowly    Standing Unsupported, Alternately Place Feet on Step/Stool  Able to complete >2 steps/needs minimal assist    Standing Unsupported, One Foot in Front  Able to plae foot ahead of the other independently and hold 30 seconds    Standing on One Leg  Able to lift leg independently and hold equal to or more than 3 seconds    Total Score  47    Berg comment:  47/56                   [facility] Adult PT Treatment/Exercise - 01/16/19 1027      Transfers   Transfers  Sit to Stand;Stand to Sit    Sit to Stand  6: Modified independent (Device/Increase time)    Five time sit to stand comments   11.59 seconds without UE support. not relying on bracing BLEs.       Ambulation/Gait   Ambulation/Gait  Yes    Ambulation/Gait Assistance  5: Supervision    Ambulation/Gait Assistance Details  Ambulated initially around gym in session with no AD, pt needs initial cueing for increased step length B with no LOB. Practiced gait training with SPC vs. quad base cane (what pt currently uses in the environment and is not very stable with this device, pt reporting feeling fearful with this cane). Pt needing multi-modal cueing for proper sequencing of cane and with step length - after massed practice indoors and outdoors on  unlevel surfaces, pt still reverts back to more shorter/narrowed steps and impaired sequencing with cane without frequent cues. Pt does demonstrate improved balance with SPC vs. small base quad cane and subjectively reports feeling more balance. Will purchase one after session today.     Ambulation Distance (Feet)  600 Feet    Assistive device  None;Straight cane    Gait Pattern  Step-through pattern;Decreased step length - right;Decreased step length - left;Narrow base of support;Decreased stride length   shuffled steps at times   Ambulation Surface  Level;Indoor;Unlevel;Outdoor;Paved    Gait velocity  16.22 secs = 2.02  ft/sec, 16.34 sec = 2.01 ft/sec with [facility]             PT Education - 01/17/19 0922    Education Details  gait training with [facility] - instructions to purchase one, progress towards goals    Person(s) Educated  Patient;Spouse    Methods  Explanation;Demonstration    Comprehension  Verbalized understanding;Returned demonstration;Need further instruction       PT Short Term Goals - 01/16/19 1026      PT SHORT TERM GOAL #1   Title  Pt will be ind with initial HEP in order to improve balance and functional mobility.  (Target Date: 01/11/19)    Baseline  pt reports she is independent with HEP    Time  4    Period  Weeks    Status  Achieved    Target Date  01/11/19      PT SHORT TERM GOAL #2   Title  Pt will improve BERG balance score to 46/56 in order to indicate decreased fall risk.    Baseline  47/56    Status  Achieved      PT SHORT TERM GOAL #3   Title  Pt will improve gait speed to >/=2.62 ft/sec with LRAD in order to indicate safe community ambulation.    Baseline  16.22 secs = 2.02  ft/sec, 16.34 sec = 2.01 ft/sec with SPC    Status  Not Met      PT SHORT TERM GOAL #4   Title  Pt will perform 5TSS in </=11 secs without UE support and no reliance of LEs on chair for support to indicate improved functional strength.    Baseline  11.59 seconds without UE  support.    Status  Partially Met      PT SHORT TERM GOAL #5  Title  Pt will ambulate x 100' at mod I level in home without AD in order to indicate improved independence in home.    Baseline  x100' with supervision with no AD    Status  Partially Met      PT SHORT TERM GOAL #6   Title  Pt will ambulate x 500' w/ LRAD at S level over unlevel paved surfaces in order to indicate safe community ambulation.    Baseline  able to ambulate 300' with [facility] outsdoors with supervision (unable to perform full 500' due to weather)    Status  Partially Met        PT Long Term Goals - 12/12/18 1339      PT LONG TERM GOAL #1   Title  Pt will be ind with final HEP in order to indicate decreased fall risk and improved functional mobility (Target Date: 02/10/2019)    Time  8    Period  Weeks    Status  New    Target Date  02/10/19      PT LONG TERM GOAL #2   Title  Pt will improve BERG balance score to >/=50/56 in order to indicate decreased fall risk.      PT LONG TERM GOAL #3   Title  Pt will ambulate at gait speed of </=2.62 ft/sec without AD in order to indicate safe and more independent community ambulation.      PT LONG TERM GOAL #4   Title  Pt will ambulate over varying outdoor surfaces x 1000' without AD at S level in order to indicate more independent community negotiation.      PT LONG TERM GOAL #5   Title  Assess DGI/FGA as able and write appropriate goal.            Plan - 01/17/19 1502    Clinical Impression Statement  Focus of today's skilled session was assessing pt's STGs. Pt has partially met 3 out of 6 STGs  in regards to gait without AD, gait in the community with SPC, and 5x sit <> stand. Pt continues to need supervision when ambulating around therapy gym with no AD. Pt has met 2 out of 6 STGs in regards to performing her HEP and increasing BERG score to a 47/56. Did not meet goals related to gait speed with SPC and with no AD (2.02 ft/sec for both) indicating pt is a  limited community ambulator. Remainder of session focused on gait training with SPC in the community vs. small base quad cane. Pt demonstrating and subjectively feeling more balanced with use of SPC. Will purchase later today. Pt is progressing well - will continue to progress towards LTGs    Personal Factors and Comorbidities  Comorbidity 2;Comorbidity 3+    Comorbidities  HTN, DMII, CVA    Examination-Activity Limitations  Caring for Others;Stairs;Locomotion Level;Carry    Examination-Participation Restrictions  Community Activity;Medication Management;Laundry    Stability/Clinical Decision Making  Evolving/Moderate complexity    Rehab Potential  Excellent    PT Frequency  2x / week    PT Duration  8 weeks    PT Treatment/Interventions  ADLs/Self Care Home Management;Aquatic Therapy;DME Instruction;Gait training;Stair training;Functional mobility training;Therapeutic activities;Therapeutic exercise;Balance training;Neuromuscular re-education;Cognitive remediation;Patient/family education;Orthotic Fit/Training;Passive range of motion;Vestibular    PT Next Visit Plan  perform DGI/FGA . Work on gait without device-obstacles, around and over, work on quality of gait without device, continue with high level balance and strengthening.    PT Home Exercise Plan  [id number]    Consulted and Agree with Plan of Care  Patient;Other (Comment)   husband/pt through interpretor      Patient will benefit from skilled therapeutic intervention in order to improve the following deficits and impairments:  Abnormal gait, Decreased activity tolerance, Decreased balance, Decreased cognition, Decreased endurance, Decreased knowledge of precautions, Decreased mobility, Decreased strength, Decreased safety awareness, Impaired perceived functional ability, Impaired flexibility, Postural dysfunction  Visit Diagnosis: Other symptoms and signs involving cognitive functions following cerebral infarction  Unsteadiness on  feet  Muscle weakness (generalized)  Other lack of coordination     Problem List Patient Active Problem List   Diagnosis Date Noted  . DM (diabetes mellitus), type 2 ([place]) 11/15/2018  . Embolic stroke ([place]) 84/73/0856  . Urinary tract infection without hematuria   . E coli bacteremia 11/08/2018  . Cerebral embolism with cerebral infarction 11/07/2018  . Pneumonia   . Pyelonephritis   . Endotracheal tube present   . Septic shock ([place])   . Sepsis ([place]) 11/01/2018  . Acute respiratory failure ([place]) 11/01/2018  . Endophthalmitis, acute 05/13/2017  . Healthcare maintenance 09/09/2016  . DM (diabetes mellitus) type 2, uncontrolled, with ketoacidosis ([place]) 05/17/2014  . Heart murmur 12/14/2013  . Cough 07/06/2013  . Hypertriglyceridemia 07/06/2013  . Type 2 diabetes mellitus without complication, with long-term current use of insulin ([place]) 07/06/2013  . Essential hypertension, benign 12/22/2012  . Hyperglycemia 11/17/2012  . Hypertension, uncontrolled 11/17/2012    Arliss Journey, PT, DPT  01/17/2019, 3:03 PM  [facility] [address] [place], [place], [address] Phone: [phone]   Fax:  [phone]  Name: Aimee Parker MRN: [id number] Date of Birth: April 26, 1950

## 2019-01-18 ENCOUNTER — Ambulatory Visit: Payer: Medicare Other | Admitting: Occupational Therapy

## 2019-01-18 ENCOUNTER — Other Ambulatory Visit: Payer: Self-pay

## 2019-01-18 ENCOUNTER — Encounter: Payer: Self-pay | Admitting: Physical Therapy

## 2019-01-18 ENCOUNTER — Ambulatory Visit: Payer: Medicare Other

## 2019-01-18 ENCOUNTER — Ambulatory Visit: Payer: Medicare Other | Admitting: Physical Therapy

## 2019-01-18 ENCOUNTER — Encounter: Payer: Self-pay | Admitting: Occupational Therapy

## 2019-01-18 DIAGNOSIS — I69318 Other symptoms and signs involving cognitive functions following cerebral infarction: Secondary | ICD-10-CM

## 2019-01-18 DIAGNOSIS — R2681 Unsteadiness on feet: Secondary | ICD-10-CM

## 2019-01-18 DIAGNOSIS — M6281 Muscle weakness (generalized): Secondary | ICD-10-CM

## 2019-01-18 DIAGNOSIS — R2689 Other abnormalities of gait and mobility: Secondary | ICD-10-CM

## 2019-01-18 DIAGNOSIS — R41841 Cognitive communication deficit: Secondary | ICD-10-CM

## 2019-01-18 DIAGNOSIS — R278 Other lack of coordination: Secondary | ICD-10-CM | POA: Diagnosis not present

## 2019-01-18 NOTE — Therapy (Signed)
[facility] [address] [place], [place], [address] Phone: [phone]   Fax:  [phone]  Physical Therapy Treatment  Patient Details  Name: Aimee Parker MRN: [id number] Date of Birth: [birth year] Referring Provider (PT): Alysia Penna, MD   Encounter Date: 01/18/2019  PT End of Session - 01/18/19 1135    Visit Number  6    Number of Visits  17    Date for PT Re-Evaluation  02/10/19    Authorization Type  [employer] Medicare/medicaid (10th visit progress note needed)    PT Start Time  0934    PT Stop Time  1015    PT Time Calculation (min)  41 min    Equipment Utilized During Treatment  Gait belt    Activity Tolerance  Patient tolerated treatment well    Behavior During Therapy  [facility] for tasks assessed/performed       Past Medical History:  Diagnosis Date  . Diabetes mellitus without complication ([place])   . Hypertension   . Vitamin D deficiency     Past Surgical History:  Procedure Laterality Date  . EYE SURGERY     Left eye surgery  . PARS PLANA VITRECTOMY Right 05/13/2017   Procedure: PARS PLANA VITRECTOMY 25 GAUGE FOR ENDOPHTHALMITIS;  Surgeon: Hurman Horn, MD;  Location: [facility];  Service: Ophthalmology;  Laterality: Right;    There were no vitals filed for this visit.  Subjective Assessment - 01/18/19 0938    Subjective  Ordered a cane - should be getting here soon. Reports that she has not been dizzy at all lately.    Patient is accompained by:  Family member;Interpreter   [name] (pronounced)   Pertinent History  HTN, DMII    Limitations  Walking;House hold activities    Patient Stated Goals  "I want to be like I was before."    Currently in Pain?  No/denies                       [facility] Adult PT Treatment/Exercise - 01/18/19 0001      Ambulation/Gait   Ambulation/Gait  Yes    Ambulation/Gait Assistance  5: Supervision    Ambulation/Gait Assistance Details  cues for larger step  length B and heel > toe pattern. When pt focusing on heel > toe, pt ambulates with a more narrow BOS. 2 x 25' obstacle negotiation weaving in and out of cones, min guard for balance and cues for step length     Ambulation Distance (Feet)  300 Feet    Assistive device  None    Gait Pattern  Step-through pattern;Decreased step length - right;Decreased step length - left;Narrow base of support;Decreased stride length   small step lengths B   Ambulation Surface  Level;Indoor      Neuro Re-ed    Neuro Re-ed Details   On blue foam in corner: wider BOS progressing to more narrow BOS multiple reps with eyes closed ranging from 15-30 seconds. Min guard/min A for balance. Wide BOS eyes closed 3 x 5 reps head turns side to side, 3 x 5 reps head nods up and down. On blue/red mats next to countertop, all with intermittent single UE/fingertip support: forward marching down and back 3 reps with cues for hip/knee flexion and slow and controlled walking, forward tandem walking down and back 3 reps, retro gait down and back 4 reps with cued for increased step length especially with LLE.  PT Short Term Goals - 01/16/19 1026      PT SHORT TERM GOAL #1   Title  Pt will be ind with initial HEP in order to improve balance and functional mobility.  (Target Date: 01/11/19)    Baseline  pt reports she is independent with HEP    Time  4    Period  Weeks    Status  Achieved    Target Date  01/11/19      PT SHORT TERM GOAL #2   Title  Pt will improve BERG balance score to 46/56 in order to indicate decreased fall risk.    Baseline  47/56    Status  Achieved      PT SHORT TERM GOAL #3   Title  Pt will improve gait speed to >/=2.62 ft/sec with LRAD in order to indicate safe community ambulation.    Baseline  16.22 secs = 2.02  ft/sec, 16.34 sec = 2.01 ft/sec with SPC    Status  Not Met      PT SHORT TERM GOAL #4   Title  Pt will perform 5TSS in </=11 secs without UE support and no reliance of  LEs on chair for support to indicate improved functional strength.    Baseline  11.59 seconds without UE support.    Status  Partially Met      PT SHORT TERM GOAL #5   Title  Pt will ambulate x 100' at mod I level in home without AD in order to indicate improved independence in home.    Baseline  x100' with supervision with no AD    Status  Partially Met      PT SHORT TERM GOAL #6   Title  Pt will ambulate x 500' w/ LRAD at S level over unlevel paved surfaces in order to indicate safe community ambulation.    Baseline  able to ambulate 300' with [facility] outsdoors with supervision (unable to perform full 500' due to weather)    Status  Partially Met        PT Long Term Goals - 12/12/18 1339      PT LONG TERM GOAL #1   Title  Pt will be ind with final HEP in order to indicate decreased fall risk and improved functional mobility (Target Date: 02/10/2019)    Time  8    Period  Weeks    Status  New    Target Date  02/10/19      PT LONG TERM GOAL #2   Title  Pt will improve BERG balance score to >/=50/56 in order to indicate decreased fall risk.      PT LONG TERM GOAL #3   Title  Pt will ambulate at gait speed of </=2.62 ft/sec without AD in order to indicate safe and more independent community ambulation.      PT LONG TERM GOAL #4   Title  Pt will ambulate over varying outdoor surfaces x 1000' without AD at S level in order to indicate more independent community negotiation.      PT LONG TERM GOAL #5   Title  Assess DGI/FGA as able and write appropriate goal.            Plan - 01/18/19 1135    Clinical Impression Statement  Focus of today's skilled session was balance on compliant surfaces and gait training with no AD. Pt reported 4/10 dizziness after standing balance with eyes closed on foam for 5 reps - reported no dizziness  after additional reps. Pt with increased difficulty with balance eyes closed with feet together. Pt needing frequent cues today for longer steps especially  for retro gait on compliant surfaces and obstacle negotiation. Pt is progressing well - will continue to progress towards LTGs.    Personal Factors and Comorbidities  Comorbidity 2;Comorbidity 3+    Comorbidities  HTN, DMII, CVA    Examination-Activity Limitations  Caring for Others;Stairs;Locomotion Level;Carry    Examination-Participation Restrictions  Community Activity;Medication Management;Laundry    Stability/Clinical Decision Making  Evolving/Moderate complexity    Rehab Potential  Excellent    PT Frequency  2x / week    PT Duration  8 weeks    PT Treatment/Interventions  ADLs/Self Care Home Management;Aquatic Therapy;DME Instruction;Gait training;Stair training;Functional mobility training;Therapeutic activities;Therapeutic exercise;Balance training;Neuromuscular re-education;Cognitive remediation;Patient/family education;Orthotic Fit/Training;Passive range of motion;Vestibular    PT Next Visit Plan  perform DGI/FGA . Work on gait without device-obstacles, around and over, work on quality of gait without device, continue with high level balance and strengthening. eyes closed balance on compliant surfaces.    PT Home Exercise Plan  [id number]    Consulted and Agree with Plan of Care  Patient;Other (Comment)   husband/pt through interpretor      Patient will benefit from skilled therapeutic intervention in order to improve the following deficits and impairments:  Abnormal gait, Decreased activity tolerance, Decreased balance, Decreased cognition, Decreased endurance, Decreased knowledge of precautions, Decreased mobility, Decreased strength, Decreased safety awareness, Impaired perceived functional ability, Impaired flexibility, Postural dysfunction  Visit Diagnosis: Muscle weakness (generalized)  Other abnormalities of gait and mobility  Unsteadiness on feet     Problem List Patient Active Problem List   Diagnosis Date Noted  . DM (diabetes mellitus), type 2 ([place]) 11/15/2018  .  Embolic stroke ([place]) 59/92/3414  . Urinary tract infection without hematuria   . E coli bacteremia 11/08/2018  . Cerebral embolism with cerebral infarction 11/07/2018  . Pneumonia   . Pyelonephritis   . Endotracheal tube present   . Septic shock ([place])   . Sepsis ([place]) 11/01/2018  . Acute respiratory failure ([place]) 11/01/2018  . Endophthalmitis, acute 05/13/2017  . Healthcare maintenance 09/09/2016  . DM (diabetes mellitus) type 2, uncontrolled, with ketoacidosis ([place]) 05/17/2014  . Heart murmur 12/14/2013  . Cough 07/06/2013  . Hypertriglyceridemia 07/06/2013  . Type 2 diabetes mellitus without complication, with long-term current use of insulin ([place]) 07/06/2013  . Essential hypertension, benign 12/22/2012  . Hyperglycemia 11/17/2012  . Hypertension, uncontrolled 11/17/2012    Arliss Journey, PT, DPT  01/18/2019, 11:39 AM  [facility] [address] [place], [place], [address] Phone: [phone]   Fax:  [phone]  Name: Aimee Parker MRN: [id number] Date of Birth: April 27, 1950

## 2019-01-18 NOTE — Therapy (Signed)
[facility] [address] [place], [place], [address] Phone: [phone]   Fax:  [phone]  Speech Language Pathology Treatment/Discharge Summary  Patient Details  Name: Aimee Parker MRN: [id number] Date of Birth: 01/30/1950 Referring Provider (SLP): Alysia Penna, MD   Encounter Date: 01/18/2019  End of Session - 01/18/19 0935    Visit Number  9    Number of Visits  17    Date for SLP Re-Evaluation  02/24/19    SLP Start Time  0850    SLP Stop Time   0928    SLP Time Calculation (min)  38 min    Activity Tolerance  Patient tolerated treatment well       Past Medical History:  Diagnosis Date  . Diabetes mellitus without complication ([facility])   . Hypertension   . Vitamin D deficiency     Past Surgical History:  Procedure Laterality Date  . EYE SURGERY     Left eye surgery  . PARS PLANA VITRECTOMY Right 05/13/2017   Procedure: PARS PLANA VITRECTOMY 25 GAUGE FOR ENDOPHTHALMITIS;  Surgeon: Hurman Horn, MD;  Location: [facility];  Service: Ophthalmology;  Laterality: Right;    There were no vitals filed for this visit.  Subjective Assessment - 01/18/19 0855    Subjective  Pt arrives with husband.    Patient is accompained by:  Interpreter            ADULT SLP TREATMENT - 01/18/19 0858      General Information   Behavior/Cognition  Alert;Cooperative;Pleasant mood      Treatment Provided   Treatment provided  Cognitive-Linquistic      Cognitive-Linquistic Treatment   Treatment focused on  Cognition;Patient/family/caregiver education    Skilled Treatment  Pt brought in BP info and blood glucose level info as SLP instructed. SLP asked BP and glucose levels this AM and pt told SLP 146/70 BP and 64 for glucose. SLP told pt that 146 seemed high but that was prior to BP and glucose meds; after meds pt reported 146 decr'd to WNL. As SLP has seen that pt asks husband about appointment info SLP asked pt about  whether or not she is looking at information  - pt stated every morning she looks at appointments and gets an idea about what she has to do that day. Pt able to tell SLP when her appointments end today, without cues. SLP asked pt husband if he has any concerns about pt's memory or concentration. He says that pt has less forgetfullness and has regained much of her skills in memory and concentration. SLP told pt/husband that it appears pt's memory and concentration are at levels that pt is safe at home. In direct questioning, both pt and husband agree pt is safe at home. SLP reiterated pt may need to make some modifications in terms of memory and focus if she returns to work.  SLP reminded pt about checklists and a small notebook to use. SLP also highlighted sticky notes and other memory compensations.        Assessment / Recommendations / Plan   Plan  Discharge SLP treatment due to (comment)   as best as can be ascertained, pt is at baseline when using compensations     Progression Toward Goals   Progression toward goals  Progressing toward goals       SLP Education - 01/18/19 0934    Education Details  compensations might be necessary when/if pt returns to work,  and may be helpful at home as well    Person(s) Educated  Patient;Spouse    Methods  Explanation    Comprehension  Verbalized understanding       SLP Short Term Goals - 12/30/18 1359      SLP SHORT TERM GOAL #1   Title  pt will demo selective attention for therapy tasks for 10 minutes x3 in min noisy environment x3 sessions    Baseline  12-27-18    Time  --    Period  --    Status  Partially Met      SLP SHORT TERM GOAL #2   Title  pt will demo emermgent awareness in simple cognitive communication tasks with mod A occasionally x3 sessions    Baseline  12-27-18    Time  --    Period  --    Status  Partially Met      SLP SHORT TERM GOAL #3   Title  Pt will establish a memory compensation system and bring with her to 3  therapy sessions.    Time  --    Period  --    Status  Deferred   pt satisfied with husband as her "memory system"      SLP Long Term Goals - 01/18/19 0903      SLP LONG TERM GOAL #1   Title  pt will demo emermgent awareness in simple cognitive communication tasks with min A rarely x3 sessions    Time  --    Period  --   or 17 sessions, for all LTGs   Status  Partially Met      SLP LONG TERM GOAL #2   Title  pt will demo 25 minutes selective attention for mod complex therapy tasks in mod noisy environment x 3 sessions    Time  --    Period  --    Status  Partially Met      SLP LONG TERM GOAL #3   Title  pt will successfully use memory system/strategies to access information about appointments, daily tasks or planned activities  x3 sessions    Baseline  01-18-19    Time  --    Period  --    Status  Partially Met      SLP LONG TERM GOAL #4   Title  Pt will report losing/misplacing items less than 2 times per week.    Time  --    Period  --    Status  Achieved      SLP LONG TERM GOAL #5   Title  Pt will use compensations for attention/executive function (checklists, timers, pre-planning) to complete tasks at home x5 per pt or spouse report    Baseline  01-12-19, 01-18-19    Time  --    Period  --    Status  Partially Met       Plan - 01/18/19 0935    Clinical Impression Statement  Pt is a [place] female requiring [place] interpreter for [place]. Believed that pt WNL voice quality is harsh/mild hoarse. Husband and pt report pt skills are functional/normal due to pt using compensations independently. Pt has not met all LTGs but has partially met or met all of them, based largely upon pt and husband reports. See "skilled treatment" for details. Pt and husband agree that pt has benefitted from [place] and that she is performing at safe levels at home and is using compensations independently.    Treatment/Interventions  Cognitive reorganization;Internal/external aids;Patient/family  education;Compensatory strategies;SLP instruction and feedback;Functional tasks    Potential to Achieve Goals  Good    Potential Considerations  Severity of impairments    Consulted and Agree with Plan of Care  Patient;Family member/caregiver    Family Member Consulted  husband       Patient will benefit from skilled therapeutic intervention in order to improve the following deficits and impairments:   Cognitive communication deficit   SPEECH THERAPY DISCHARGE SUMMARY  Visits from Start of Care: 9  Current functional level related to goals / functional outcomes: See "skilled intervention" and goal summary above.   Remaining deficits: Mild cognitive lintguisitic deficits - SLP told pt she may require copmensations for memory when/if she returns to work.   Education / Equipment: Compensatory strategies for memory and attention.   Plan: Patient agrees to discharge.  Patient goals were partially met. Patient is being discharged due to being pleased with the current functional level.  ?????       Problem List Patient Active Problem List   Diagnosis Date Noted  . DM (diabetes mellitus), type 2 ([facility]) 11/15/2018  . Embolic stroke ([facility]) 70/96/2836  . Urinary tract infection without hematuria   . E coli bacteremia 11/08/2018  . Cerebral embolism with cerebral infarction 11/07/2018  . Pneumonia   . Pyelonephritis   . Endotracheal tube present   . Septic shock ([facility])   . Sepsis ([facility]) 11/01/2018  . Acute respiratory failure ([facility]) 11/01/2018  . Endophthalmitis, acute 05/13/2017  . Healthcare maintenance 09/09/2016  . DM (diabetes mellitus) type 2, uncontrolled, with ketoacidosis ([facility]) 05/17/2014  . Heart murmur 12/14/2013  . Cough 07/06/2013  . Hypertriglyceridemia 07/06/2013  . Type 2 diabetes mellitus without complication, with long-term current use of insulin ([facility]) 07/06/2013  . Essential hypertension, benign 12/22/2012  . Hyperglycemia 11/17/2012  . Hypertension,  uncontrolled 11/17/2012    Preeti Winegardner ,[place], [place]  01/18/2019, 1:05 PM  [facility] [address] [place], [place], [address] Phone: [phone]   Fax:  [phone]   Name: Guyla Bless MRN: [id number] Date of Birth: 01-13-1950

## 2019-01-18 NOTE — Therapy (Signed)
[facility] [address] [place], [place], [address] Phone: [phone]   Fax:  [phone]  Occupational Therapy Treatment  Patient Details  Name: Aimee Parker MRN: [id number] Date of Birth: 01-Sep-1950 Referring Provider (OT): Dr. Alysia Penna   Encounter Date: 01/18/2019  OT End of Session - 01/18/19 1023    Visit Number  6    Number of Visits  17    Date for OT Re-Evaluation  02/06/19    Authorization Type  [employer] MCR primary, MCD secondary    Authorization - Visit Number  6    Authorization - Number of Visits  10    OT Start Time  1020    OT Stop Time  1100    OT Time Calculation (min)  40 min    Activity Tolerance  Patient tolerated treatment well       Past Medical History:  Diagnosis Date  . Diabetes mellitus without complication ([facility])   . Hypertension   . Vitamin D deficiency     Past Surgical History:  Procedure Laterality Date  . EYE SURGERY     Left eye surgery  . PARS PLANA VITRECTOMY Right 05/13/2017   Procedure: PARS PLANA VITRECTOMY 25 GAUGE FOR ENDOPHTHALMITIS;  Surgeon: Hurman Horn, MD;  Location: [facility];  Service: Ophthalmology;  Laterality: Right;    There were no vitals filed for this visit.  Subjective Assessment - 01/18/19 1348    Subjective   Denies pain via [place]    Patient is accompanied by:  Family member;Interpreter   interpreter via [place]   Pertinent History  CVA 10/2018. Admitted for septic shock on 11/01/18 and intubated 11/02/18. MRI showed CVA on 11/06/18.    Limitations  fall risk, needs interpreter    Patient Stated Goals  get balance better    Currently in Pain?  No/denies           Treatment: Basic cooking task to fry an egg, pt required min v.c to locate correct burner but she completed the task safely and turned off stove without prompts. Min v.c to allow the pan to cool before washing(pt reports she though she had to hurry up since there was limited  time in therapy session. Gripper set at level 2 to pick up 1 inch blocks, min-mod drops with RUE. Standing to place and remove graded clothespins form vertical antennae with RUE for sustained pinch. Arm bike x 6 mins level 3 for conditioning and reciprocal movement                   OT Short Term Goals - 01/18/19 1022      OT SHORT TERM GOAL #1   Title  Independent with HEP for BUE arm strengthening, Rt hand strengthening, and LUE gross motor coordination - extended to 01/20/2019 due to missed visits    Time  4    Period  Weeks    Status  Achieved      OT SHORT TERM GOAL #2   Title  Pt to bathe self w/ supervision and A/E prn    Baseline  min assist    Time  4    Period  Weeks    Status  Achieved      OT SHORT TERM GOAL #3   Title  Pt to improve Rt grip strength to 30 lbs for opening jars/containers    Baseline  22 lbs (Lt = 35 lbs)    Time  4  Period  Weeks    Status  On-going   01/16/2019  27 pounds     OT SHORT TERM GOAL #4   Title  Pt to stand to fold towels and wash dishes for 10 minutes w/o rest    Time  4    Period  Weeks    Status  Achieved   01/16/2019  15 minutes then rated fatigue 4/10     OT SHORT TERM GOAL #5   Title  Pt to make simple snack/sandwich w/ supervision    Time  4    Period  Weeks    Status  Achieved        OT Long Term Goals - 01/18/19 1041      OT LONG TERM GOAL #1   Title  Pt to return to simple cooking tasks at mod I level    Time  8    Period  Weeks    Status  On-going      OT LONG TERM GOAL #2   Title  Pt to return to light household maintence/cleaning and laundry tasks mod I level    Time  8    Period  Weeks    Status  On-going      OT LONG TERM GOAL #3   Title  Pt to attend to Rt side and perform tabletop visual scanning without cues    Time  8    Period  Weeks    Status  On-going      OT LONG TERM GOAL #4   Title  Pt to demo sufficient strength BUE's to put groceries away and retrieve/replace items up to 3  lbs on high shelf    Time  8    Period  Weeks    Status  On-going              Patient will benefit from skilled therapeutic intervention in order to improve the following deficits and impairments:           Visit Diagnosis: Muscle weakness (generalized)  Other abnormalities of gait and mobility  Unsteadiness on feet  Other symptoms and signs involving cognitive functions following cerebral infarction    Problem List Patient Active Problem List   Diagnosis Date Noted  . DM (diabetes mellitus), type 2 ([facility]) 11/15/2018  . Embolic stroke ([facility]) 11/15/2018  . Urinary tract infection without hematuria   . E coli bacteremia 11/08/2018  . Cerebral embolism with cerebral infarction 11/07/2018  . Pneumonia   . Pyelonephritis   . Endotracheal tube present   . Septic shock ([facility])   . Sepsis ([facility]) 11/01/2018  . Acute respiratory failure ([facility]) 11/01/2018  . Endophthalmitis, acute 05/13/2017  . Healthcare maintenance 09/09/2016  . DM (diabetes mellitus) type 2, uncontrolled, with ketoacidosis ([facility]) 05/17/2014  . Heart murmur 12/14/2013  . Cough 07/06/2013  . Hypertriglyceridemia 07/06/2013  . Type 2 diabetes mellitus without complication, with long-term current use of insulin ([facility]) 07/06/2013  . Essential hypertension, benign 12/22/2012  . Hyperglycemia 11/17/2012  . Hypertension, uncontrolled 11/17/2012    Jahree Dermody 01/18/2019, 1:49 PM  [facility] [address] [place], [place], [address] Phone: [phone]   Fax:  [phone]  Name: Aimee Parker MRN: [id number] Date of Birth: Mar 13, 1950

## 2019-01-20 ENCOUNTER — Encounter: Payer: Medicare Other | Admitting: Occupational Therapy

## 2019-01-20 ENCOUNTER — Ambulatory Visit: Payer: Medicare Other | Admitting: Physical Therapy

## 2019-01-23 ENCOUNTER — Other Ambulatory Visit: Payer: Self-pay

## 2019-01-23 ENCOUNTER — Ambulatory Visit: Payer: Medicare Other | Admitting: Physical Therapy

## 2019-01-23 ENCOUNTER — Ambulatory Visit: Payer: Medicare Other | Admitting: Occupational Therapy

## 2019-01-23 DIAGNOSIS — R2689 Other abnormalities of gait and mobility: Secondary | ICD-10-CM

## 2019-01-23 DIAGNOSIS — R278 Other lack of coordination: Secondary | ICD-10-CM | POA: Diagnosis not present

## 2019-01-23 DIAGNOSIS — M6281 Muscle weakness (generalized): Secondary | ICD-10-CM

## 2019-01-23 DIAGNOSIS — R2681 Unsteadiness on feet: Secondary | ICD-10-CM

## 2019-01-23 NOTE — Patient Instructions (Signed)
Access Code: [email or web address]  URL: [email or web address]  Date: 01/23/2019  Prepared by: Sherlie Ban   Exercises Walking March - 3 reps - 1 sets - 2x daily - 7x weekly Side Stepping with Counter Support - 3 reps - 1 sets - 2x daily - 7x weekly Tandem Walking with Counter Support - 3 reps - 1 sets - 2x daily - 7x weekly Backward Walking with Counter Support - 3 reps - 1 sets - 2x daily - 7x weekly Toe Walking with Counter Support - 3 reps - 1 sets - 2x daily - 7x weekly Sit to Stand - 10 reps - 2 sets - 2x daily - 7x weekly Walking with Head Nod - 3 sets - 2x daily - 7x weekly Walking with Head Rotation - 3 sets - 2x daily - 7x weekly Romberg Stance with Eyes Closed - 3 sets - 20-30 hold - 2x daily - 7x weekly

## 2019-01-23 NOTE — Therapy (Signed)
[facility] [address] [place], [place], [address] Phone: [phone]   Fax:  [phone]  Physical Therapy Treatment  Patient Details  Name: Aimee Parker MRN: [id number] Date of Birth: 1950/12/21 Referring Provider (PT): Alysia Penna, MD   Encounter Date: 01/23/2019  PT End of Session - 01/23/19 1204    Visit Number  7    Number of Visits  17    Date for PT Re-Evaluation  02/10/19    Authorization Type  [employer]HC Medicare/medicaid (10th visit progress note needed)    PT Start Time  1015    PT Stop Time  1100    PT Time Calculation (min)  45 min    Equipment Utilized During Treatment  Gait belt    Activity Tolerance  Patient tolerated treatment well    Behavior During Therapy  [facility] for tasks assessed/performed       Past Medical History:  Diagnosis Date  . Diabetes mellitus without complication ([place])   . Hypertension   . Vitamin D deficiency     Past Surgical History:  Procedure Laterality Date  . EYE SURGERY     Left eye surgery  . PARS PLANA VITRECTOMY Right 05/13/2017   Procedure: PARS PLANA VITRECTOMY 25 GAUGE FOR ENDOPHTHALMITIS;  Surgeon: Hurman Horn, MD;  Location: [facility];  Service: Ophthalmology;  Laterality: Right;    There were no vitals filed for this visit.  Subjective Assessment - 01/23/19 1019    Subjective  Got her SPC - states she likes it. States that dizziness is getting better.    Patient is accompained by:  Family member;Interpreter   [name] (pronounced)   Pertinent History  HTN, DMII    Limitations  Walking;House hold activities    Patient Stated Goals  "I want to be like I was before."    Currently in Pain?  No/denies         [facility] PT Assessment - 01/23/19 1031      Functional Gait  Assessment   Gait assessed   Yes    Gait Level Surface  Walks 20 ft, slow speed, abnormal gait pattern, evidence for imbalance or deviates 10-15 in outside of the 12 in walkway width. Requires  more than 7 sec to ambulate 20 ft.    Change in Gait Speed  Able to change speed, demonstrates mild gait deviations, deviates 6-10 in outside of the 12 in walkway width, or no gait deviations, unable to achieve a major change in velocity, or uses a change in velocity, or uses an assistive device.    Gait with Horizontal Head Turns  Performs head turns smoothly with slight change in gait velocity (eg, minor disruption to smooth gait path), deviates 6-10 in outside 12 in walkway width, or uses an assistive device.    Gait with Vertical Head Turns  Performs task with slight change in gait velocity (eg, minor disruption to smooth gait path), deviates 6 - 10 in outside 12 in walkway width or uses assistive device    Gait and Pivot Turn  Pivot turns safely within 3 sec and stops quickly with no loss of balance.    Step Over Obstacle  Is able to step over one shoe box (4.5 in total height) but must slow down and adjust steps to clear box safely. May require verbal cueing.    Gait with Narrow Base of Support  Ambulates less than 4 steps heel to toe or cannot perform without assistance.  Gait with Eyes Closed  Walks 20 ft, slow speed, abnormal gait pattern, evidence for imbalance, deviates 10-15 in outside 12 in walkway width. Requires more than 9 sec to ambulate 20 ft.    Ambulating Backwards  Walks 20 ft, slow speed, abnormal gait pattern, evidence for imbalance, deviates 10-15 in outside 12 in walkway width.    Steps  Two feet to a stair, must use rail.    Total Score  14    FGA comment:  14/30                   [facility] Adult PT Treatment/Exercise - 01/23/19 0001      Ambulation/Gait   Ambulation/Gait  Yes    Ambulation/Gait Assistance  5: Supervision    Ambulation/Gait Assistance Details  cues for step length     Ambulation Distance (Feet)  200 Feet   approx. throughout session   Assistive device  None    Gait Pattern  Step-through pattern;Decreased step length - right;Decreased step  length - left;Narrow base of support;Decreased stride length    Ambulation Surface  Level;Indoor      Neuro Re-ed    Neuro Re-ed Details   At countertop for intermittent UE support: stepping over single black foam on floor at the lowest height 1 x 10 reps B, stepping over 3 black foam at lowest height down and back 6 reps progressing to no UE support and cues for reciprocal pattern and increased L hip/knee flexion, progressing to black foam at the highest height down and back 2 reps with intermittent UE support. Min guard for balance. Pt reporting dizziness after last rep but subsided immediately after stopping activity and standing to rest against countertop.          Access Code: [email or web address]  URL: [email or web address]  Date: 01/23/2019  Prepared by: Janann August   Exercises Walking March - 3 reps - 1 sets - 2x daily - 7x weekly Side Stepping with Counter Support - 3 reps - 1 sets - 2x daily - 7x weekly Tandem Walking with Counter Support - 3 reps - 1 sets - 2x daily - 7x weekly Backward Walking with Counter Support - 3 reps - 1 sets - 2x daily - 7x weekly Toe Walking with Counter Support - 3 reps - 1 sets - 2x daily - 7x weekly Sit to Stand - 10 reps - 2 sets - 2x daily - 7x weekly  New additions to HEP at countertop and in corner:  Walking with Head Nod - 3 sets - 2x daily - 7x weekly Walking with Head Rotation - 3 sets - 2x daily - 7x weekly Romberg Stance with Eyes Closed - 3 sets - 20-30 hold - 2x daily - 7x weekly     PT Education - 01/23/19 1203    Education Details  additions to HEP, fall risk based on FGA    Person(s) Educated  Patient    Methods  Explanation;Demonstration;Handout    Comprehension  Verbalized understanding;Returned demonstration       PT Short Term Goals - 01/16/19 1026      PT SHORT TERM GOAL #1   Title  Pt will be ind with initial HEP in order to improve balance and functional mobility.  (Target Date: 01/11/19)    Baseline  pt  reports she is independent with HEP    Time  4    Period  Weeks    Status  Achieved    Target Date  01/11/19  PT SHORT TERM GOAL #2   Title  Pt will improve BERG balance score to 46/56 in order to indicate decreased fall risk.    Baseline  47/56    Status  Achieved      PT SHORT TERM GOAL #3   Title  Pt will improve gait speed to >/=2.62 ft/sec with LRAD in order to indicate safe community ambulation.    Baseline  16.22 secs = 2.02  ft/sec, 16.34 sec = 2.01 ft/sec with SPC    Status  Not Met      PT SHORT TERM GOAL #4   Title  Pt will perform 5TSS in </=11 secs without UE support and no reliance of LEs on chair for support to indicate improved functional strength.    Baseline  11.59 seconds without UE support.    Status  Partially Met      PT SHORT TERM GOAL #5   Title  Pt will ambulate x 100' at mod I level in home without AD in order to indicate improved independence in home.    Baseline  x100' with supervision with no AD    Status  Partially Met      PT SHORT TERM GOAL #6   Title  Pt will ambulate x 500' w/ LRAD at S level over unlevel paved surfaces in order to indicate safe community ambulation.    Baseline  able to ambulate 300' with [facility] outsdoors with supervision (unable to perform full 500' due to weather)    Status  Partially Met        PT Long Term Goals - 01/23/19 1219      PT LONG TERM GOAL #1   Title  Pt will be ind with final HEP in order to indicate decreased fall risk and improved functional mobility (Target Date: 02/10/2019)    Time  8    Period  Weeks    Status  New      PT LONG TERM GOAL #2   Title  Pt will improve BERG balance score to >/=50/56 in order to indicate decreased fall risk.      PT LONG TERM GOAL #3   Title  Pt will ambulate at gait speed of </=2.62 ft/sec without AD in order to indicate safe and more independent community ambulation.      PT LONG TERM GOAL #4   Title  Pt will ambulate over varying outdoor surfaces x 1000' without AD  at S level in order to indicate more independent community negotiation.      PT LONG TERM GOAL #5   Title  Patient will improve FGA score to at least an 18/30 in order to demo decr fall risk.    Baseline  14/30 on 01/23/19    Status  Revised            Plan - 01/23/19 1218    Clinical Impression Statement  Performed the FGA today with no AD and pt scored a 14/30 indicating a high fall risk, revised LTG as appropriate. Added gait with head turns/head nods and corner balance feet together eyes closed to pt's HEP - pt verbalized and demonstrated understanding. Remainder of session focused on stepping over obstacles with focus on SLS and L hip/knee flexion. Pt does well with increased repetitions and was able to perform with no UE support at end of session. Will continue to progress towards LTGs.    Personal Factors and Comorbidities  Comorbidity 2;Comorbidity 3+    Comorbidities  HTN,  DMII, CVA    Examination-Activity Limitations  Caring for Others;Stairs;Locomotion Level;Carry    Examination-Participation Restrictions  Community Activity;Medication Management;Laundry    Stability/Clinical Decision Making  Evolving/Moderate complexity    Rehab Potential  Excellent    PT Frequency  2x / week    PT Duration  8 weeks    PT Treatment/Interventions  ADLs/Self Care Home Management;Aquatic Therapy;DME Instruction;Gait training;Stair training;Functional mobility training;Therapeutic activities;Therapeutic exercise;Balance training;Neuromuscular re-education;Cognitive remediation;Patient/family education;Orthotic Fit/Training;Passive range of motion;Vestibular    PT Next Visit Plan  backwards gait. Work on gait without device-obstacles, around and over, work on quality of gait without device, continue with high level balance and strengthening. eyes closed balance on compliant surfaces.    PT Home Exercise Plan  [email or web address]    Consulted and Agree with Plan of Care  Patient;Other (Comment)   husband/pt  through interpretor      Patient will benefit from skilled therapeutic intervention in order to improve the following deficits and impairments:  Abnormal gait, Decreased activity tolerance, Decreased balance, Decreased cognition, Decreased endurance, Decreased knowledge of precautions, Decreased mobility, Decreased strength, Decreased safety awareness, Impaired perceived functional ability, Impaired flexibility, Postural dysfunction  Visit Diagnosis: Other abnormalities of gait and mobility  Muscle weakness (generalized)  Unsteadiness on feet     Problem List Patient Active Problem List   Diagnosis Date Noted  . DM (diabetes mellitus), type 2 ([place]) 11/15/2018  . Embolic stroke ([place]) 83/33/8329  . Urinary tract infection without hematuria   . E coli bacteremia 11/08/2018  . Cerebral embolism with cerebral infarction 11/07/2018  . Pneumonia   . Pyelonephritis   . Endotracheal tube present   . Septic shock ([place])   . Sepsis ([place]) 11/01/2018  . Acute respiratory failure ([place]) 11/01/2018  . Endophthalmitis, acute 05/13/2017  . Healthcare maintenance 09/09/2016  . DM (diabetes mellitus) type 2, uncontrolled, with ketoacidosis ([place]) 05/17/2014  . Heart murmur 12/14/2013  . Cough 07/06/2013  . Hypertriglyceridemia 07/06/2013  . Type 2 diabetes mellitus without complication, with long-term current use of insulin ([place]) 07/06/2013  . Essential hypertension, benign 12/22/2012  . Hyperglycemia 11/17/2012  . Hypertension, uncontrolled 11/17/2012    Arliss Journey, PT, DPT  01/23/2019, 12:19 PM  [facility] [address] [place], [place], [address] Phone: [phone]   Fax:  [phone]  Name: Aimee Parker MRN: [id number] Date of Birth: 1950-11-27

## 2019-01-24 ENCOUNTER — Ambulatory Visit: Payer: Medicare Other

## 2019-01-25 ENCOUNTER — Encounter: Payer: Self-pay | Admitting: Physical Therapy

## 2019-01-25 ENCOUNTER — Ambulatory Visit: Payer: Medicare Other | Admitting: Physical Therapy

## 2019-01-25 ENCOUNTER — Encounter: Payer: Self-pay | Admitting: Occupational Therapy

## 2019-01-25 ENCOUNTER — Other Ambulatory Visit: Payer: Self-pay

## 2019-01-25 ENCOUNTER — Ambulatory Visit: Payer: Medicare Other | Admitting: Occupational Therapy

## 2019-01-25 DIAGNOSIS — R278 Other lack of coordination: Secondary | ICD-10-CM

## 2019-01-25 DIAGNOSIS — R2681 Unsteadiness on feet: Secondary | ICD-10-CM

## 2019-01-25 DIAGNOSIS — R2689 Other abnormalities of gait and mobility: Secondary | ICD-10-CM

## 2019-01-25 DIAGNOSIS — M6281 Muscle weakness (generalized): Secondary | ICD-10-CM

## 2019-01-25 DIAGNOSIS — I69318 Other symptoms and signs involving cognitive functions following cerebral infarction: Secondary | ICD-10-CM

## 2019-01-25 NOTE — Therapy (Signed)
[facility] [address] [place], [place], [address] Phone: [phone]   Fax:  [phone]  Physical Therapy Treatment  Patient Details  Name: Aimee Parker MRN: [id number] Date of Birth: December 01, 1950 Referring Provider (PT): Alysia Penna, MD   Encounter Date: 01/25/2019  PT End of Session - 01/25/19 1113    Visit Number  8    Number of Visits  17    Date for PT Re-Evaluation  02/10/19    Authorization Type  [employer]HC Medicare/medicaid (10th visit progress note needed)    PT Start Time  1018    PT Stop Time  1100    PT Time Calculation (min)  42 min    Equipment Utilized During Treatment  Gait belt    Activity Tolerance  Patient tolerated treatment well    Behavior During Therapy  WFL for tasks assessed/performed       Past Medical History:  Diagnosis Date  . Diabetes mellitus without complication ([place])   . Hypertension   . Vitamin D deficiency     Past Surgical History:  Procedure Laterality Date  . EYE SURGERY     Left eye surgery  . PARS PLANA VITRECTOMY Right 05/13/2017   Procedure: PARS PLANA VITRECTOMY 25 GAUGE FOR ENDOPHTHALMITIS;  Surgeon: Aimee Horn, MD;  Location: [facility];  Service: Ophthalmology;  Laterality: Right;    There were no vitals filed for this visit.  Subjective Assessment - 01/25/19 1020    Subjective  Has tried the new exercises at home. States that they are going well. No falls.    Patient is accompained by:  Family member;Interpreter   [name] (pronounced)   Pertinent History  HTN, DMII    Limitations  Walking;House hold activities    Patient Stated Goals  "I want to be like I was before."    Currently in Pain?  No/denies                       [facility] Adult PT Treatment/Exercise - 01/25/19 0001      Ambulation/Gait   Ambulation/Gait  Yes    Ambulation/Gait Assistance  4: Min assist    Ambulation/Gait Assistance Details  With therapist providing BUE support to  pt during gait with no AD (therapist ambulating backwards with pt ambulating forwards) for increased gait speed and step length B- at start of session and throughout session between activities. Pt with initial carryover when not walking with pt, however will continue to need to practice to reinforce larger step lengths     Ambulation Distance (Feet)  200 Feet    Assistive device  None    Gait Pattern  Step-through pattern;Decreased step length - right;Decreased step length - left;Narrow base of support;Decreased stride length    Ambulation Surface  Level;Indoor      Neuro Re-ed    Neuro Re-ed Details   Standing by // bars with no UE support: Forward stepping strategy towards colored targets 1 x 10 reps progressing standing on blue foam with forward stepping strategy to targets 1 x 10 reps - needed 2 episodes of min A for balance , posterior stepping strategy 1 x 10 reps on foam, lateral stepping strategy 1 x 10 reps on foam. With floor ladder next to countertop: alternating forward walking with reciprocal step through pattern to facilitate longer step length - cues for heel > toe pattern down and back 4 reps, and then side stepping with floor ladder, manual cues  from therapist at pelvis for facing counter and cues for larger step width. Intermittent standing/seated rest breaks taken in between. Standing on blue foam beam in // bars: wide BOS 2 x 5 reps head nods, 2 x 5 reps head turns - progressed to performing same head nods/head turns with narrower BOS. With wider BOS - eyes closed, 5 reps ranging from 5-15 seconds, needing min guard for balance.                PT Short Term Goals - 01/16/19 1026      PT SHORT TERM GOAL #1   Title  Pt will be ind with initial HEP in order to improve balance and functional mobility.  (Target Date: 01/11/19)    Baseline  pt reports she is independent with HEP    Time  4    Period  Weeks    Status  Achieved    Target Date  01/11/19      PT SHORT TERM GOAL  #2   Title  Pt will improve BERG balance score to 46/56 in order to indicate decreased fall risk.    Baseline  47/56    Status  Achieved      PT SHORT TERM GOAL #3   Title  Pt will improve gait speed to >/=2.62 ft/sec with LRAD in order to indicate safe community ambulation.    Baseline  16.22 secs = 2.02  ft/sec, 16.34 sec = 2.01 ft/sec with SPC    Status  Not Met      PT SHORT TERM GOAL #4   Title  Pt will perform 5TSS in </=11 secs without UE support and no reliance of LEs on chair for support to indicate improved functional strength.    Baseline  11.59 seconds without UE support.    Status  Partially Met      PT SHORT TERM GOAL #5   Title  Pt will ambulate x 100' at mod I level in home without AD in order to indicate improved independence in home.    Baseline  x100' with supervision with no AD    Status  Partially Met      PT SHORT TERM GOAL #6   Title  Pt will ambulate x 500' w/ LRAD at S level over unlevel paved surfaces in order to indicate safe community ambulation.    Baseline  able to ambulate 300' with [facility] outsdoors with supervision (unable to perform full 500' due to weather)    Status  Partially Met        PT Long Term Goals - 01/23/19 1219      PT LONG TERM GOAL #1   Title  Pt will be ind with final HEP in order to indicate decreased fall risk and improved functional mobility (Target Date: 02/10/2019)    Time  8    Period  Weeks    Status  New      PT LONG TERM GOAL #2   Title  Pt will improve BERG balance score to >/=50/56 in order to indicate decreased fall risk.      PT LONG TERM GOAL #3   Title  Pt will ambulate at gait speed of </=2.62 ft/sec without AD in order to indicate safe and more independent community ambulation.      PT LONG TERM GOAL #4   Title  Pt will ambulate over varying outdoor surfaces x 1000' without AD at S level in order to indicate more independent community negotiation.  PT LONG TERM GOAL #5   Title  Patient will improve FGA  score to at least an 18/30 in order to demo decr fall risk.    Baseline  14/30 on 01/23/19    Status  Revised            Plan - 01/25/19 1200    Clinical Impression Statement  Focus of today's skilled session was balance strategies and gait training with focus on step length with no AD. Pt performed well when therapist providing BUE support to pt (therapist ambulating backwards and pt walking forward) for increased gait speed and step length. Better carryover at end of session, however pt will continue to need more practice. Only had minimal dizziness during one rep of head nods balance. Will continue to progress towards LTGs    Personal Factors and Comorbidities  Comorbidity 2;Comorbidity 3+    Comorbidities  HTN, DMII, CVA    Examination-Activity Limitations  Caring for Others;Stairs;Locomotion Level;Carry    Examination-Participation Restrictions  Community Activity;Medication Management;Laundry    Stability/Clinical Decision Making  Evolving/Moderate complexity    Rehab Potential  Excellent    PT Frequency  2x / week    PT Duration  8 weeks    PT Treatment/Interventions  ADLs/Self Care Home Management;Aquatic Therapy;DME Instruction;Gait training;Stair training;Functional mobility training;Therapeutic activities;Therapeutic exercise;Balance training;Neuromuscular re-education;Cognitive remediation;Patient/family education;Orthotic Fit/Training;Passive range of motion;Vestibular    PT Next Visit Plan  backwards gait and gait training/step length with no AD. Work on gait without device-obstacles, around and over, work on quality of gait without device, continue with high level balance and strengthening. eyes closed balance on compliant surfaces.    PT Home Exercise Plan  [email or web address]    Consulted and Agree with Plan of Care  Patient;Other (Comment)   husband/pt through interpretor      Patient will benefit from skilled therapeutic intervention in order to improve the following deficits and  impairments:  Abnormal gait, Decreased activity tolerance, Decreased balance, Decreased cognition, Decreased endurance, Decreased knowledge of precautions, Decreased mobility, Decreased strength, Decreased safety awareness, Impaired perceived functional ability, Impaired flexibility, Postural dysfunction  Visit Diagnosis: Muscle weakness (generalized)  Unsteadiness on feet  Other abnormalities of gait and mobility  Other lack of coordination     Problem List Patient Active Problem List   Diagnosis Date Noted  . DM (diabetes mellitus), type 2 ([place]) 11/15/2018  . Embolic stroke ([place]) 20/80/2233  . Urinary tract infection without hematuria   . E coli bacteremia 11/08/2018  . Cerebral embolism with cerebral infarction 11/07/2018  . Pneumonia   . Pyelonephritis   . Endotracheal tube present   . Septic shock ([place])   . Sepsis ([place]) 11/01/2018  . Acute respiratory failure ([place]) 11/01/2018  . Endophthalmitis, acute 05/13/2017  . Healthcare maintenance 09/09/2016  . DM (diabetes mellitus) type 2, uncontrolled, with ketoacidosis ([place]) 05/17/2014  . Heart murmur 12/14/2013  . Cough 07/06/2013  . Hypertriglyceridemia 07/06/2013  . Type 2 diabetes mellitus without complication, with long-term current use of insulin ([place]) 07/06/2013  . Essential hypertension, benign 12/22/2012  . Hyperglycemia 11/17/2012  . Hypertension, uncontrolled 11/17/2012    Arliss Journey, PT, DPT  01/25/2019, 12:16 PM  [facility] [address] [place], [place], [address] Phone: [phone]   Fax:  [phone]  Name: Eladia Frame MRN: [id number] Date of Birth: 06-17-1950

## 2019-01-25 NOTE — Therapy (Signed)
[facility] [address] [place], [place], [address] Phone: [phone]   Fax:  [phone]  Occupational Therapy Treatment  Patient Details  Name: Aimee Parker MRN: [id number] Date of Birth: 02-17-1950 Referring Provider (OT): Dr. Claudette Laws   Encounter Date: 01/25/2019  OT End of Session - 01/25/19 1035    Visit Number  7    Number of Visits  17    Date for OT Re-Evaluation  02/06/19    Authorization Type  [employer] MCR primary, MCD secondary    Authorization - Visit Number  7    Authorization - Number of Visits  10    OT Start Time  0932    OT Stop Time  1015    OT Time Calculation (min)  43 min    Activity Tolerance  Patient tolerated treatment well       Past Medical History:  Diagnosis Date  . Diabetes mellitus without complication (HCC)   . Hypertension   . Vitamin D deficiency     Past Surgical History:  Procedure Laterality Date  . EYE SURGERY     Left eye surgery  . PARS PLANA VITRECTOMY Right 05/13/2017   Procedure: PARS PLANA VITRECTOMY 25 GAUGE FOR ENDOPHTHALMITIS;  Surgeon: Edmon Crape, MD;  Location: [facility] OR;  Service: Ophthalmology;  Laterality: Right;    There were no vitals filed for this visit.  Subjective Assessment - 01/25/19 0938    Patient is accompanied by:  Family member;Interpreter   husband, interpretor via [place]   Pertinent History  CVA 10/2018. Admitted for septic shock on 11/01/18 and intubated 11/02/18. MRI showed CVA on 11/06/18.    Limitations  fall risk, needs interpreter    Patient Stated Goals  get balance better                   OT Treatments/Exercises (OP) - 01/25/19 0001      ADLs   ADL Comments  Pt reports she is cooking daily however husband helping out with lifitng pots of liquid, etc due to balance and safety.  Pt also reports that on "good days" she can sweep the floor, do the laundry and wash the dishes.  Pt reports her "bad days" she feels  tired.  States she has more good days than bad - averages 4 good days per week.  Reassessed R visual field - pt with improved R field and sees to approximately 30* into R field in all directions with peripheral vision only.  Pt reports she no longer feels she is missing things on the R at home in a familiar environment.        Neurological Re-education Exercises   Other Exercises 1  Gripper on #3 5 blocks x3 with moderate difficulty. Stacking and unstacking blocks (stacks of 4) to address sustained muscle activity on gripper #2 with little difficulty. Pt reports fatigue of 3/10 at end of all gripper activities.  Addressed UE strengthening as well as assessing LTG #4. Pt able to lift maximum of 5 pounds x3 into overhead cabinet with first LUE then RUE after lifting the following:  2 pounds x3, 3 pounds x3, 4 pounds x3 with each UE.  Pt reports it is "heavy" but not truly hard. Feels the activity just "makes me feel tired but not really my arm tired."      Other Exercises 2  Also addressed functional ambulation/dynamic standing balance with emphasis on increasing speed and step length. Pt able  to complete 115 ft with HH assist to facilitate and steady however was then fatigued and needed to rest - pt with mild SOB however quickly recovered.                OT Short Term Goals - 01/18/19 1022      OT SHORT TERM GOAL #1   Title  Independent with HEP for BUE arm strengthening, Rt hand strengthening, and LUE gross motor coordination - extended to 01/20/2019 due to missed visits    Time  4    Period  Weeks    Status  Achieved      OT SHORT TERM GOAL #2   Title  Pt to bathe self w/ supervision and A/E prn    Baseline  min assist    Time  4    Period  Weeks    Status  Achieved      OT SHORT TERM GOAL #3   Title  Pt to improve Rt grip strength to 30 lbs for opening jars/containers    Baseline  22 lbs (Lt = 35 lbs)    Time  4    Period  Weeks    Status  On-going   01/16/2019  27 pounds     OT  SHORT TERM GOAL #4   Title  Pt to stand to fold towels and wash dishes for 10 minutes w/o rest    Time  4    Period  Weeks    Status  Achieved   01/16/2019  15 minutes then rated fatigue 4/10     OT SHORT TERM GOAL #5   Title  Pt to make simple snack/sandwich w/ supervision    Time  4    Period  Weeks    Status  Achieved        OT Long Term Goals - 01/25/19 1030      OT LONG TERM GOAL #1   Title  Pt to return to simple cooking tasks at mod I level    Time  8    Period  Weeks    Status  On-going      OT LONG TERM GOAL #2   Title  Pt to return to light household maintence/cleaning and laundry tasks mod I level    Time  8    Period  Weeks    Status  On-going   01/25/19 - pt cooking daily, can wash dishes/sweep/do laundry on "good days" - 4/7 days per week. Husband does assist with heavy items when cooking     OT LONG TERM GOAL #3   Title  Pt to attend to Rt side and perform tabletop visual scanning without cues    Time  8    Period  Weeks    Status  On-going      OT LONG TERM GOAL #4   Title  Pt to demo sufficient strength BUE's to put groceries away and retrieve/replace items up to 3 lbs on high shelf    Time  8    Period  Weeks    Status  Achieved   able to lift 5 pound object x3 each UE           Plan - 01/25/19 1032    Clinical Impression Statement  Pt is progressing toward goals. Pt's biggest challenges at this time appear to be balance and activity tolerance.  Vision and UE functional use are improving.    OT Occupational Profile and History  Detailed  Assessment- Review of Records and additional review of physical, cognitive, psychosocial history related to current functional performance    Occupational performance deficits (Please refer to evaluation for details):  ADL's;IADL's    Body Structure / Function / Physical Skills  ADL;IADL;Body mechanics;Balance;Mobility;Sensation;Coordination;UE functional use;Decreased knowledge of use of DME;Proprioception     Rehab Potential  Good    Clinical Decision Making  Several treatment options, min-mod task modification necessary    Comorbidities Affecting Occupational Performance:  May have comorbidities impacting occupational performance    Modification or Assistance to Complete Evaluation   Max significant modification of tasks or assist is necessary to complete    OT Frequency  2x / week    OT Duration  8 weeks    OT Treatment/Interventions  Self-care/ADL training;Therapeutic exercise;Functional Mobility Training;Neuromuscular education;Manual Therapy;Therapeutic activities;DME and/or AE instruction;Cognitive remediation/compensation;Visual/perceptual remediation/compensation;Moist Heat;Passive range of motion;Patient/family education    Plan  simple functional tasks, use [place] interpreter, grip strength.  Check sustained overhead functional use and issue some home mgmt tasks pt is safe to complete. Pt is doing some limited cooking, washing dishes. Could do laundry, sweeping as well.    Consulted and Agree with Plan of Care  Patient       Patient will benefit from skilled therapeutic intervention in order to improve the following deficits and impairments:   Body Structure / Function / Physical Skills: ADL, IADL, Body mechanics, Balance, Mobility, Sensation, Coordination, UE functional use, Decreased knowledge of use of DME, Proprioception       Visit Diagnosis: Muscle weakness (generalized)  Unsteadiness on feet  Other symptoms and signs involving cognitive functions following cerebral infarction  Other lack of coordination    Problem List Patient Active Problem List   Diagnosis Date Noted  . DM (diabetes mellitus), type 2 (HCC) 11/15/2018  . Embolic stroke (HCC) 11/15/2018  . Urinary tract infection without hematuria   . E coli bacteremia 11/08/2018  . Cerebral embolism with cerebral infarction 11/07/2018  . Pneumonia   . Pyelonephritis   . Endotracheal tube present   . Septic  shock (HCC)   . Sepsis (HCC) 11/01/2018  . Acute respiratory failure (HCC) 11/01/2018  . Endophthalmitis, acute 05/13/2017  . Healthcare maintenance 09/09/2016  . DM (diabetes mellitus) type 2, uncontrolled, with ketoacidosis (HCC) 05/17/2014  . Heart murmur 12/14/2013  . Cough 07/06/2013  . Hypertriglyceridemia 07/06/2013  . Type 2 diabetes mellitus without complication, with long-term current use of insulin (HCC) 07/06/2013  . Essential hypertension, benign 12/22/2012  . Hyperglycemia 11/17/2012  . Hypertension, uncontrolled 11/17/2012    Norton Pastel, OTR/L 01/25/2019, 10:36 AM   [facility] [address] [place], [place], [address] Phone: [phone]   Fax:  [phone]  Name: Genora Arp MRN: [id number] Date of Birth: 09-20-1950

## 2019-01-30 ENCOUNTER — Ambulatory Visit: Payer: Medicare Other | Admitting: Occupational Therapy

## 2019-01-30 ENCOUNTER — Encounter: Payer: Self-pay | Admitting: Occupational Therapy

## 2019-01-30 ENCOUNTER — Encounter: Payer: Self-pay | Admitting: Physical Therapy

## 2019-01-30 ENCOUNTER — Other Ambulatory Visit: Payer: Self-pay

## 2019-01-30 ENCOUNTER — Ambulatory Visit: Payer: Medicare Other | Admitting: Physical Therapy

## 2019-01-30 DIAGNOSIS — R2681 Unsteadiness on feet: Secondary | ICD-10-CM

## 2019-01-30 DIAGNOSIS — R278 Other lack of coordination: Secondary | ICD-10-CM | POA: Diagnosis not present

## 2019-01-30 DIAGNOSIS — M6281 Muscle weakness (generalized): Secondary | ICD-10-CM

## 2019-01-30 DIAGNOSIS — R2689 Other abnormalities of gait and mobility: Secondary | ICD-10-CM

## 2019-01-30 DIAGNOSIS — I69318 Other symptoms and signs involving cognitive functions following cerebral infarction: Secondary | ICD-10-CM

## 2019-01-30 NOTE — Therapy (Signed)
[facility] [address] [place], [place], [address] Phone: [phone]   Fax:  [phone]  Occupational Therapy Treatment  Patient Details  Name: Aimee Parker MRN: [id number] Date of Birth: [birth year] Referring Provider (OT): Dr. Alysia Penna   Encounter Date: 01/30/2019  OT End of Session - 01/30/19 1255    Visit Number  8    Number of Visits  17    Date for OT Re-Evaluation  02/06/19    Authorization Type  UHC MCR primary, MCD secondary    Authorization - Visit Number  8    Authorization - Number of Visits  10    OT Start Time  0847    OT Stop Time  [phone]    OT Time Calculation (min)  41 min    Activity Tolerance  Patient tolerated treatment well       Past Medical History:  Diagnosis Date  . Diabetes mellitus without complication ([place])   . Hypertension   . Vitamin D deficiency     Past Surgical History:  Procedure Laterality Date  . EYE SURGERY     Left eye surgery  . PARS PLANA VITRECTOMY Right 05/13/2017   Procedure: PARS PLANA VITRECTOMY 25 GAUGE FOR ENDOPHTHALMITIS;  Surgeon: Hurman Horn, MD;  Location: [employer] [facility];  Service: Ophthalmology;  Laterality: Right;    There were no vitals filed for this visit.  Subjective Assessment - 01/30/19 0852    Subjective   When I walk like that if feels more normal    Pertinent History  CVA 10/2018. Admitted for septic shock on 11/01/18 and intubated 11/02/18. MRI showed CVA on 11/06/18.    Limitations  fall risk, needs interpreter    Patient Stated Goals  get balance better    Currently in Pain?  No/denies                   OT Treatments/Exercises (OP) - 01/30/19 0001      Visual/Perceptual Exercises   Other Exercises  Table top scanning activity (double number cancellation at 64M level with 100% accuracy. Also completed symbol digits modality test with increased time but 100% accuracy      Neurological Re-education Exercises   Other  Exercises 1  Neuor re ed to address dynamic standing balance and functional ambulation with emphasis on more narrow BOS, step length, increasing speed and small head turns - all to decrease fall risk and reduce energy consumption.  Pt with improved performance with this after practice and repetition.  Pt expressed that she is fearful of falling at home and that is why she takes small steps and moves slowly.  Practiced with pt wearng gait belt and husband next to pt to provide steadying assist.  Pt and husband able to return demonstrate and discussed with PT for continued practice in next session. PT, pt and husband in agreement - see PT note.                OT Short Term Goals - 01/30/19 1251      OT SHORT TERM GOAL #1   Title  Independent with HEP for BUE arm strengthening, Rt hand strengthening, and LUE gross motor coordination - extended to 01/20/2019 due to missed visits    Time  4    Period  Weeks    Status  Achieved      OT SHORT TERM GOAL #2   Title  Pt to bathe self w/ supervision and A/E  prn    Baseline  min assist    Time  4    Period  Weeks    Status  Achieved      OT SHORT TERM GOAL #3   Title  Pt to improve Rt grip strength to 30 lbs for opening jars/containers    Baseline  22 lbs (Lt = 35 lbs)    Time  4    Period  Weeks    Status  On-going   01/16/2019  27 pounds     OT SHORT TERM GOAL #4   Title  Pt to stand to fold towels and wash dishes for 10 minutes w/o rest    Time  4    Period  Weeks    Status  Achieved   01/16/2019  15 minutes then rated fatigue 4/10     OT SHORT TERM GOAL #5   Title  Pt to make simple snack/sandwich w/ supervision    Time  4    Period  Weeks    Status  Achieved        OT Long Term Goals - 01/30/19 1251      OT LONG TERM GOAL #1   Title  Pt to return to simple cooking tasks at mod I level    Time  8    Period  Weeks    Status  On-going      OT LONG TERM GOAL #2   Title  Pt to return to light household maintence/cleaning and  laundry tasks mod I level    Time  8    Period  Weeks    Status  On-going   01/25/19 - pt cooking daily, can wash dishes/sweep/do laundry on "good days" - 4/7 days per week. Husband does assist with heavy items when cooking     OT LONG TERM GOAL #3   Title  Pt to attend to Rt side and perform tabletop visual scanning without cues    Time  8    Period  Weeks    Status  Achieved      OT LONG TERM GOAL #4   Title  Pt to demo sufficient strength BUE's to put groceries away and retrieve/replace items up to 3 lbs on high shelf    Time  8    Period  Weeks    Status  Achieved   able to lift 5 pound object x3 each UE           Plan - 01/30/19 1252    Clinical Impression Statement  Pt with continued improvement in faciliated functional ambulation and dynamic standing balance. Pt continues to demonstrate decreased activity tolerance.    OT Occupational Profile and History  Detailed Assessment- Review of Records and additional review of physical, cognitive, psychosocial history related to current functional performance    Occupational performance deficits (Please refer to evaluation for details):  ADL's;IADL's    Body Structure / Function / Physical Skills  ADL;IADL;Body mechanics;Balance;Mobility;Sensation;Coordination;UE functional use;Decreased knowledge of use of DME;Proprioception    Cognitive Skills  Attention;Memory;Thought;Understand    Rehab Potential  Good    Clinical Decision Making  Several treatment options, min-mod task modification necessary    Comorbidities Affecting Occupational Performance:  May have comorbidities impacting occupational performance    Modification or Assistance to Complete Evaluation   Max significant modification of tasks or assist is necessary to complete    OT Frequency  2x / week    OT Duration  8 weeks  OT Treatment/Interventions  Self-care/ADL training;Therapeutic exercise;Functional Mobility Training;Neuromuscular education;Manual  Therapy;Therapeutic activities;DME and/or AE instruction;Cognitive remediation/compensation;Visual/perceptual remediation/compensation;Moist Heat;Passive range of motion;Patient/family education    Plan  simple functional tasks, use [place] interpreter, grip strength.  NMR for functional ambulation, dynamic standing balance. Check sustained overhead functional use and issue some home mgmt tasks pt is safe to complete. Pt is doing some limited cooking, washing dishes. Could do laundry, sweeping as well.    Consulted and Agree with Plan of Care  Patient;Family member/caregiver    Family Member Consulted  husband       Patient will benefit from skilled therapeutic intervention in order to improve the following deficits and impairments:   Body Structure / Function / Physical Skills: ADL, IADL, Body mechanics, Balance, Mobility, Sensation, Coordination, UE functional use, Decreased knowledge of use of DME, Proprioception Cognitive Skills: Attention, Memory, Thought, Understand     Visit Diagnosis: Muscle weakness (generalized)  Unsteadiness on feet  Other lack of coordination  Other symptoms and signs involving cognitive functions following cerebral infarction    Problem List Patient Active Problem List   Diagnosis Date Noted  . DM (diabetes mellitus), type 2 (HCC) 11/15/2018  . Embolic stroke (HCC) 11/15/2018  . Urinary tract infection without hematuria   . E coli bacteremia 11/08/2018  . Cerebral embolism with cerebral infarction 11/07/2018  . Pneumonia   . Pyelonephritis   . Endotracheal tube present   . Septic shock (HCC)   . Sepsis (HCC) 11/01/2018  . Acute respiratory failure (HCC) 11/01/2018  . Endophthalmitis, acute 05/13/2017  . Healthcare maintenance 09/09/2016  . DM (diabetes mellitus) type 2, uncontrolled, with ketoacidosis (HCC) 05/17/2014  . Heart murmur 12/14/2013  . Cough 07/06/2013  . Hypertriglyceridemia 07/06/2013  . Type 2 diabetes mellitus without  complication, with long-term current use of insulin (HCC) 07/06/2013  . Essential hypertension, benign 12/22/2012  . Hyperglycemia 11/17/2012  . Hypertension, uncontrolled 11/17/2012    Norton Pastel, OTR/L 01/30/2019, 12:56 PM  [facility] [address] [place], [place], [address] Phone: [phone]   Fax:  [phone]  Name: Aimee Parker MRN: [id number] Date of Birth: 02/01/1950

## 2019-01-30 NOTE — Therapy (Signed)
[facility] [address] [place], [place], [address] Phone: [phone]   Fax:  [phone]  Physical Therapy Treatment  Patient Details  Name: Aimee Parker MRN: [id number] Date of Birth: [birth year] Referring Provider (PT): Alysia Penna, MD   Encounter Date: 01/30/2019  PT End of Session - 01/30/19 1022    Visit Number  9    Number of Visits  17    Date for PT Re-Evaluation  02/10/19    Authorization Type  UHC Medicare/medicaid (10th visit progress note needed)    PT Start Time  0935    PT Stop Time  1017    PT Time Calculation (min)  42 min    Equipment Utilized During Treatment  Gait belt    Activity Tolerance  Patient tolerated treatment well    Behavior During Therapy  [facility] for tasks assessed/performed       Past Medical History:  Diagnosis Date  . Diabetes mellitus without complication ([place])   . Hypertension   . Vitamin D deficiency     Past Surgical History:  Procedure Laterality Date  . EYE SURGERY     Left eye surgery  . PARS PLANA VITRECTOMY Right 05/13/2017   Procedure: PARS PLANA VITRECTOMY 25 GAUGE FOR ENDOPHTHALMITIS;  Surgeon: Hurman Horn, MD;  Location: [place];  Service: Ophthalmology;  Laterality: Right;    There were no vitals filed for this visit.  Subjective Assessment - 01/30/19 0939    Subjective  States that she is doing well. Feels as if her walking is getting better with longer steps.    Patient is accompained by:  Family member;Interpreter   [name] (pronounced)   Pertinent History  HTN, DMII    Limitations  Walking;House hold activities    Patient Stated Goals  "I want to be like I was before."    Currently in Pain?  No/denies                       [facility] Adult PT Treatment/Exercise - 01/30/19 1024      Ambulation/Gait   Ambulation/Gait  Yes    Ambulation/Gait Assistance  4: Min guard;5: Supervision    Ambulation/Gait Assistance Details  Initial cues  for increased step length B with more wide BOS, pt with reciprocal arm swing when performing with larger stride length. Practiced 1 lap with pt's husband providing min guard with gait belt with pt taking larger step lengths B, instructed on performing at home as part of HEP as they have a long hallway - states that they have a gait belt like this at home. Performed gait throughout session today with no AD - progressing to supervision while maintaining stride length.      Ambulation Distance (Feet)  805 Feet    Assistive device  None    Gait Pattern  Step-through pattern;Decreased step length - right;Decreased step length - left;Narrow base of support;Decreased stride length    Ambulation Surface  Level;Indoor      Knee/Hip Exercises: Standing   Forward Step Up  Hand Hold: 0;2 sets;10 reps;Step Height: 6"    Forward Step Up Limitations  Standing on blue foam beam: 1 x 10 reps step up B onto 6" step with single UE support          Balance Exercises - 01/30/19 1030      Balance Exercises: Standing   Standing Eyes Closed  Wide (BOA);Foam/compliant surface;5 reps;20 secs;Other (comment)   progressing  to 20-30 seconds, initial dizziness    Other Standing Exercises  Standing on rocker board: holding board steady 3 x 20 seconds, 2 x 10 reps head turns, 2 x 10 reps head nods with min A intermittently for balance, pt reporting 3/10 dizziness while performing head motions, subsided immediately afterwards, got better with 2nd rep.         PT Education - 01/30/19 1031    Education Details  gait training at home with no AD, use of gait belt.    Person(s) Educated  Patient;Spouse    Methods  Explanation;Demonstration    Comprehension  Verbalized understanding;Returned demonstration       PT Short Term Goals - 01/16/19 1026      PT SHORT TERM GOAL #1   Title  Pt will be ind with initial HEP in order to improve balance and functional mobility.  (Target Date: 01/11/19)    Baseline  pt reports she  is independent with HEP    Time  4    Period  Weeks    Status  Achieved    Target Date  01/11/19      PT SHORT TERM GOAL #2   Title  Pt will improve BERG balance score to 46/56 in order to indicate decreased fall risk.    Baseline  47/56    Status  Achieved      PT SHORT TERM GOAL #3   Title  Pt will improve gait speed to >/=2.62 ft/sec with LRAD in order to indicate safe community ambulation.    Baseline  16.22 secs = 2.02  ft/sec, 16.34 sec = 2.01 ft/sec with SPC    Status  Not Met      PT SHORT TERM GOAL #4   Title  Pt will perform 5TSS in </=11 secs without UE support and no reliance of LEs on chair for support to indicate improved functional strength.    Baseline  11.59 seconds without UE support.    Status  Partially Met      PT SHORT TERM GOAL #5   Title  Pt will ambulate x 100' at mod I level in home without AD in order to indicate improved independence in home.    Baseline  x100' with supervision with no AD    Status  Partially Met      PT SHORT TERM GOAL #6   Title  Pt will ambulate x 500' w/ LRAD at S level over unlevel paved surfaces in order to indicate safe community ambulation.    Baseline  able to ambulate 300' with [facility] outsdoors with supervision (unable to perform full 500' due to weather)    Status  Partially Met        PT Long Term Goals - 01/23/19 1219      PT LONG TERM GOAL #1   Title  Pt will be ind with final HEP in order to indicate decreased fall risk and improved functional mobility (Target Date: 02/10/2019)    Time  8    Period  Weeks    Status  New      PT LONG TERM GOAL #2   Title  Pt will improve BERG balance score to >/=50/56 in order to indicate decreased fall risk.      PT LONG TERM GOAL #3   Title  Pt will ambulate at gait speed of </=2.62 ft/sec without AD in order to indicate safe and more independent community ambulation.      PT LONG TERM  GOAL #4   Title  Pt will ambulate over varying outdoor surfaces x 1000' without AD at S level  in order to indicate more independent community negotiation.      PT LONG TERM GOAL #5   Title  Patient will improve FGA score to at least an 18/30 in order to demo decr fall risk.    Baseline  14/30 on 01/23/19    Status  Revised            Plan - 01/30/19 1032    Clinical Impression Statement  Focus of today's skilled session focused on gait training with no AD and balance strategies on compliant surfaces with focus on eyes closed/head turns. Pt with improvement of step length B with increased repetition today and reciprocal arm swing, progressing to supervision during gait. Pt subjectively reporting feeling like this is her more normal balance. Pt reported mild dizziness 2-3/10 with balance with head motions and eyes closed during initial rep, but it improved with increased repetition. Pt is progressing well - will continue to progress towards LTGs.    Personal Factors and Comorbidities  Comorbidity 2;Comorbidity 3+    Comorbidities  HTN, DMII, CVA    Examination-Activity Limitations  Caring for Others;Stairs;Locomotion Level;Carry    Examination-Participation Restrictions  Community Activity;Medication Management;Laundry    Stability/Clinical Decision Making  Evolving/Moderate complexity    Rehab Potential  Excellent    PT Frequency  2x / week    PT Duration  8 weeks    PT Treatment/Interventions  ADLs/Self Care Home Management;Aquatic Therapy;DME Instruction;Gait training;Stair training;Functional mobility training;Therapeutic activities;Therapeutic exercise;Balance training;Neuromuscular re-education;Cognitive remediation;Patient/family education;Orthotic Fit/Training;Passive range of motion;Vestibular    PT Next Visit Plan  10th visit progress note at next session. backwards gait and gait training/step length with no AD. Work on gait without device-obstacles, around and over, work on quality of gait without device, continue with high level balance and strengthening. eyes closed balance  on compliant surfaces.    PT Home Exercise Plan  [id number]    Consulted and Agree with Plan of Care  Patient;Other (Comment)   husband/pt through interpretor      Patient will benefit from skilled therapeutic intervention in order to improve the following deficits and impairments:  Abnormal gait, Decreased activity tolerance, Decreased balance, Decreased cognition, Decreased endurance, Decreased knowledge of precautions, Decreased mobility, Decreased strength, Decreased safety awareness, Impaired perceived functional ability, Impaired flexibility, Postural dysfunction  Visit Diagnosis: Muscle weakness (generalized)  Unsteadiness on feet  Other abnormalities of gait and mobility  Other lack of coordination     Problem List Patient Active Problem List   Diagnosis Date Noted  . DM (diabetes mellitus), type 2 ([place]) 11/15/2018  . Embolic stroke ([place]) 24/40/1027  . Urinary tract infection without hematuria   . E coli bacteremia 11/08/2018  . Cerebral embolism with cerebral infarction 11/07/2018  . Pneumonia   . Pyelonephritis   . Endotracheal tube present   . Septic shock ([place])   . Sepsis ([place]) 11/01/2018  . Acute respiratory failure ([place]) 11/01/2018  . Endophthalmitis, acute 05/13/2017  . Healthcare maintenance 09/09/2016  . DM (diabetes mellitus) type 2, uncontrolled, with ketoacidosis ([place]) 05/17/2014  . Heart murmur 12/14/2013  . Cough 07/06/2013  . Hypertriglyceridemia 07/06/2013  . Type 2 diabetes mellitus without complication, with long-term current use of insulin ([place]) 07/06/2013  . Essential hypertension, benign 12/22/2012  . Hyperglycemia 11/17/2012  . Hypertension, uncontrolled 11/17/2012    Arliss Journey, PT, DPT  01/30/2019, 10:34 AM  [place]  [facility] [address] [place], [place], [address] Phone: [phone]   Fax:  [phone]  Name: Aimee Parker MRN: [id number] Date of Birth:  27-Jan-1950

## 2019-02-01 ENCOUNTER — Other Ambulatory Visit: Payer: Self-pay

## 2019-02-01 ENCOUNTER — Ambulatory Visit: Payer: Medicare Other | Admitting: Occupational Therapy

## 2019-02-01 ENCOUNTER — Encounter: Payer: Self-pay | Admitting: Physical Therapy

## 2019-02-01 ENCOUNTER — Ambulatory Visit: Payer: Medicare Other | Admitting: Physical Therapy

## 2019-02-01 DIAGNOSIS — R278 Other lack of coordination: Secondary | ICD-10-CM | POA: Diagnosis not present

## 2019-02-01 DIAGNOSIS — R2689 Other abnormalities of gait and mobility: Secondary | ICD-10-CM

## 2019-02-01 DIAGNOSIS — M6281 Muscle weakness (generalized): Secondary | ICD-10-CM

## 2019-02-01 DIAGNOSIS — R2681 Unsteadiness on feet: Secondary | ICD-10-CM

## 2019-02-01 NOTE — Therapy (Signed)
[facility] [address] [place], [place], [address] Phone: [phone]   Fax:  [phone]  Occupational Therapy Treatment  Patient Details  Name: Aimee Parker MRN: [id number] Date of Birth: Aug 16, 1950 Referring Provider (OT): Dr. Claudette Laws   Encounter Date: 02/01/2019  OT End of Session - 02/01/19 1232    Visit Number  9    Number of Visits  17    Date for OT Re-Evaluation  02/06/19    Authorization Type  UHC MCR primary, MCD secondary    Authorization - Visit Number  9    Authorization - Number of Visits  10    OT Start Time  1015    OT Stop Time  1055    OT Time Calculation (min)  40 min    Activity Tolerance  Patient tolerated treatment well    Behavior During Therapy  [facility] for tasks assessed/performed       Past Medical History:  Diagnosis Date  . Diabetes mellitus without complication ([facility])   . Hypertension   . Vitamin D deficiency     Past Surgical History:  Procedure Laterality Date  . EYE SURGERY     Left eye surgery  . PARS PLANA VITRECTOMY Right 05/13/2017   Procedure: PARS PLANA VITRECTOMY 25 GAUGE FOR ENDOPHTHALMITIS;  Surgeon: Edmon Crape, MD;  Location: [facility] OR;  Service: Ophthalmology;  Laterality: Right;    There were no vitals filed for this visit.  Subjective Assessment - 02/01/19 1016    Subjective   Pt reports dizziness during session today 7/10 when looking up in extreme ranges    Patient is accompanied by:  Family member;Interpreter   husband   Pertinent History  CVA 10/2018. Admitted for septic shock on 11/01/18 and intubated 11/02/18. MRI showed CVA on 11/06/18.    Limitations  fall risk, needs interpreter    Currently in Pain?  No/denies       Gripper set at level 2 resistance to pick up blocks Rt hand for sustained grip strength w/ no difficulty. Increased to level 3 resistance to stack and unstack blocks (3 stacks of 5) w/ mod difficulty and drops and 2 rest breaks  required.  Standing to retrieve and replace 10 cones RUE w/ 1 lb wrist weight for UE strength/endurance.  Worked on sustained overhead functional use (screwing/unscrewing nuts and bolts) on high surface - however after completing 3, pt became dizzy (7/10) due to extreme upward gaze and needed to sit for 2-3 minutes before dizziness subsided. Pt reports dizziness when looking up/down at home, but worse when looking up and dizziness increases if in extreme ranges. Pt had no difficulty sustaining RUE in high flexion.  Pt ambulating while carrying cup 1/2 full of H20 w/ no spills or drops but required mod cues for normal step length (used gait belt and CGA). Pt simulated sweeping w/ no LOB. Pt shown alternative ways for picking up dirt for fall prevention (vs. Leaning over to get dirt off floor)                       OT Short Term Goals - 01/30/19 1251      OT SHORT TERM GOAL #1   Title  Independent with HEP for BUE arm strengthening, Rt hand strengthening, and LUE gross motor coordination - extended to 01/20/2019 due to missed visits    Time  4    Period  Weeks    Status  Achieved      OT SHORT TERM GOAL #2   Title  Pt to bathe self w/ supervision and A/E prn    Baseline  min assist    Time  4    Period  Weeks    Status  Achieved      OT SHORT TERM GOAL #3   Title  Pt to improve Rt grip strength to 30 lbs for opening jars/containers    Baseline  22 lbs (Lt = 35 lbs)    Time  4    Period  Weeks    Status  On-going   01/16/2019  27 pounds     OT SHORT TERM GOAL #4   Title  Pt to stand to fold towels and wash dishes for 10 minutes w/o rest    Time  4    Period  Weeks    Status  Achieved   01/16/2019  15 minutes then rated fatigue 4/10     OT SHORT TERM GOAL #5   Title  Pt to make simple snack/sandwich w/ supervision    Time  4    Period  Weeks    Status  Achieved        OT Long Term Goals - 01/30/19 1251      OT LONG TERM GOAL #1   Title  Pt to return to  simple cooking tasks at mod I level    Time  8    Period  Weeks    Status  On-going      OT LONG TERM GOAL #2   Title  Pt to return to light household maintence/cleaning and laundry tasks mod I level    Time  8    Period  Weeks    Status  On-going   01/25/19 - pt cooking daily, can wash dishes/sweep/do laundry on "good days" - 4/7 days per week. Husband does assist with heavy items when cooking     OT LONG TERM GOAL #3   Title  Pt to attend to Rt side and perform tabletop visual scanning without cues    Time  8    Period  Weeks    Status  Achieved      OT LONG TERM GOAL #4   Title  Pt to demo sufficient strength BUE's to put groceries away and retrieve/replace items up to 3 lbs on high shelf    Time  8    Period  Weeks    Status  Achieved   able to lift 5 pound object x3 each UE           Plan - 02/01/19 1233    Clinical Impression Statement  Pt with increased dizziness today 7/10 and pain at back of neck with extreme upward gaze which subsided after a few minutes (MD informed). Pt tolerated all other treatment well    Occupational performance deficits (Please refer to evaluation for details):  ADL's;IADL's    Body Structure / Function / Physical Skills  ADL;IADL;Body mechanics;Balance;Mobility;Sensation;Coordination;UE functional use;Decreased knowledge of use of DME;Proprioception    Cognitive Skills  Attention;Memory;Thought;Understand    Rehab Potential  Good    OT Frequency  2x / week    OT Duration  8 weeks    OT Treatment/Interventions  Self-care/ADL training;Therapeutic exercise;Functional Mobility Training;Neuromuscular education;Manual Therapy;Therapeutic activities;DME and/or AE instruction;Cognitive remediation/compensation;Visual/perceptual remediation/compensation;Moist Heat;Passive range of motion;Patient/family education    Plan  10th progress note!    Consulted and Agree with Plan of Care  Patient;Family  member/caregiver    Family Member Consulted  husband        Patient will benefit from skilled therapeutic intervention in order to improve the following deficits and impairments:   Body Structure / Function / Physical Skills: ADL, IADL, Body mechanics, Balance, Mobility, Sensation, Coordination, UE functional use, Decreased knowledge of use of DME, Proprioception Cognitive Skills: Attention, Memory, Thought, Understand     Visit Diagnosis: Muscle weakness (generalized)  Unsteadiness on feet    Problem List Patient Active Problem List   Diagnosis Date Noted  . DM (diabetes mellitus), type 2 ([facility]) 11/15/2018  . Embolic stroke ([facility]) 11/15/2018  . Urinary tract infection without hematuria   . E coli bacteremia 11/08/2018  . Cerebral embolism with cerebral infarction 11/07/2018  . Pneumonia   . Pyelonephritis   . Endotracheal tube present   . Septic shock ([facility])   . Sepsis ([facility]) 11/01/2018  . Acute respiratory failure ([facility]) 11/01/2018  . Endophthalmitis, acute 05/13/2017  . Healthcare maintenance 09/09/2016  . DM (diabetes mellitus) type 2, uncontrolled, with ketoacidosis ([facility]) 05/17/2014  . Heart murmur 12/14/2013  . Cough 07/06/2013  . Hypertriglyceridemia 07/06/2013  . Type 2 diabetes mellitus without complication, with long-term current use of insulin ([facility]) 07/06/2013  . Essential hypertension, benign 12/22/2012  . Hyperglycemia 11/17/2012  . Hypertension, uncontrolled 11/17/2012    Kelli Churn, OTR/L 02/01/2019, 12:35 PM  [facility] [address] [place], [place], [address] Phone: [phone]   Fax:  [phone]  Name: Aimee Parker MRN: [id number] Date of Birth: 1950/08/12

## 2019-02-01 NOTE — Therapy (Signed)
[facility] [address] [place], [place], [address] Phone: [phone]   Fax:  [phone]  Physical Therapy Treatment/10th Visit Progress Note   Patient Details  Name: Aimee Parker MRN: [id number] Date of Birth: December 08, 1950 Referring Provider (PT): Alysia Penna, MD   Encounter Date: 02/01/2019  Progress Note Reporting Period 12/12/18 to 02/01/19   See note below for Objective Data and Assessment of Progress/Goals.   PT End of Session - 02/01/19 1150    Visit Number  10    Number of Visits  17    Date for PT Re-Evaluation  02/10/19    Authorization Type  UHC Medicare/medicaid (10th visit progress note needed)    PT Start Time  0933    PT Stop Time  1016    PT Time Calculation (min)  43 min    Equipment Utilized During Treatment  Gait belt    Activity Tolerance  Patient tolerated treatment well    Behavior During Therapy  WFL for tasks assessed/performed       Past Medical History:  Diagnosis Date  . Diabetes mellitus without complication ([place])   . Hypertension   . Vitamin D deficiency     Past Surgical History:  Procedure Laterality Date  . EYE SURGERY     Left eye surgery  . PARS PLANA VITRECTOMY Right 05/13/2017   Procedure: PARS PLANA VITRECTOMY 25 GAUGE FOR ENDOPHTHALMITIS;  Surgeon: Hurman Horn, MD;  Location: [facility];  Service: Ophthalmology;  Laterality: Right;    There were no vitals filed for this visit.  Subjective Assessment - 02/01/19 0936    Subjective  Doing well - LLE is feeling weaker than the R. Has been walking at home with her husband and taking big steps.    Patient is accompained by:  Family member;Interpreter   [name] (pronounced)   Pertinent History  HTN, DMII    Limitations  Walking;House hold activities    Patient Stated Goals  "I want to be like I was before."    Currently in Pain?  No/denies                       [facility] Adult PT Treatment/Exercise -  02/01/19 0001      Ambulation/Gait   Ambulation/Gait  Yes    Ambulation/Gait Assistance  5: Supervision    Ambulation/Gait Assistance Details  Focus on larger step length with reciprocal arm swing, pt ambulating with less narrow BOS today. therapist providing supervision today and maintain able to maintain gait pattern with 3 laps around therapy gym.      Ambulation Distance (Feet)  330 Feet    Assistive device  None    Gait Pattern  Step-through pattern;Narrow base of support;Decreased stride length    Ambulation Surface  Level;Indoor      Exercises   Exercises  Other Exercises    Other Exercises   2 x 10 reps supine bridging with hip ABD with 3 second hold at the top -needed cues for technique, 2 x 10 reps clamshells B with red theraband, more difficulty with LLE. Tall kneeling mini squats 2 x 10 reps cues for posture, technique, and glute activation - need for UE support on block, Tall kneeling hip ABD B  1 x 10 reps with BUE support, Tall kneeling balance with no UE support and eyes closed - 5 x 10-15 second reps. Pt reporting initial dizziness when performing (3/10)- after 1st rep then pt reporting  no dizziness.        Access Code: [email or web address]  URL: [email or web address]  Date: 02/01/2019  Prepared by: Janann August   Exercises Walking March - 3 reps - 1 sets - 2x daily - 7x weekly Side Stepping with Counter Support - 3 reps - 1 sets - 2x daily - 7x weekly Tandem Walking with Counter Support - 3 reps - 1 sets - 2x daily - 7x weekly Backward Walking with Counter Support - 3 reps - 1 sets - 2x daily - 7x weekly Toe Walking with Counter Support - 3 reps - 1 sets - 2x daily - 7x weekly Sit to Stand - 10 reps - 2 sets - 2x daily - 7x weekly Walking with Head Nod - 3 sets - 2x daily - 7x weekly Walking with Head Rotation - 3 sets - 2x daily - 7x weekly Romberg Stance with Eyes Closed - 3 sets - 20-30 hold - 2x daily - 7x weekly  New additions to HEP:  Supine Bridge - 10  reps - 2 sets - 2x daily - 5x weekly Clamshell with Resistance - 10 reps - 2 sets - 1x daily - 5x weekly -with red theraband        PT Education - 02/01/19 1149    Education Details  LE strengthening additions to HEP    Person(s) Educated  Patient;Spouse    Methods  Explanation;Demonstration;Handout    Comprehension  Verbalized understanding;Returned demonstration       PT Short Term Goals - 01/16/19 1026      PT SHORT TERM GOAL #1   Title  Pt will be ind with initial HEP in order to improve balance and functional mobility.  (Target Date: 01/11/19)    Baseline  pt reports she is independent with HEP    Time  4    Period  Weeks    Status  Achieved    Target Date  01/11/19      PT SHORT TERM GOAL #2   Title  Pt will improve BERG balance score to 46/56 in order to indicate decreased fall risk.    Baseline  47/56    Status  Achieved      PT SHORT TERM GOAL #3   Title  Pt will improve gait speed to >/=2.62 ft/sec with LRAD in order to indicate safe community ambulation.    Baseline  16.22 secs = 2.02  ft/sec, 16.34 sec = 2.01 ft/sec with SPC    Status  Not Met      PT SHORT TERM GOAL #4   Title  Pt will perform 5TSS in </=11 secs without UE support and no reliance of LEs on chair for support to indicate improved functional strength.    Baseline  11.59 seconds without UE support.    Status  Partially Met      PT SHORT TERM GOAL #5   Title  Pt will ambulate x 100' at mod I level in home without AD in order to indicate improved independence in home.    Baseline  x100' with supervision with no AD    Status  Partially Met      PT SHORT TERM GOAL #6   Title  Pt will ambulate x 500' w/ LRAD at S level over unlevel paved surfaces in order to indicate safe community ambulation.    Baseline  able to ambulate 300' with [facility] outsdoors with supervision (unable to perform full 500' due to weather)    Status  Partially  Met        PT Long Term Goals - 01/23/19 1219      PT LONG  TERM GOAL #1   Title  Pt will be ind with final HEP in order to indicate decreased fall risk and improved functional mobility (Target Date: 02/10/2019)    Time  8    Period  Weeks    Status  New      PT LONG TERM GOAL #2   Title  Pt will improve BERG balance score to >/=50/56 in order to indicate decreased fall risk.      PT LONG TERM GOAL #3   Title  Pt will ambulate at gait speed of </=2.62 ft/sec without AD in order to indicate safe and more independent community ambulation.      PT LONG TERM GOAL #4   Title  Pt will ambulate over varying outdoor surfaces x 1000' without AD at S level in order to indicate more independent community negotiation.      PT LONG TERM GOAL #5   Title  Patient will improve FGA score to at least an 18/30 in order to demo decr fall risk.    Baseline  14/30 on 01/23/19    Status  Revised            Plan - 02/01/19 1151    Clinical Impression Statement  10th visit progress note: Pt ambulated 330' around session today with supervision with no AD  and pt able to demonstrate improved step length B, reciprocal arm swing, and a less narrow BOS. Pt continues to have mild 3/10 dizziness after initial repetitions of balance with eyes closed/head motions - subsides after initial rep and no reported dizziness with remaining reps. Remainder of session focused on LE strengthening - with pt weaker with LLE>RLE. Added supine bridges and clamshells to HEP. Pt is progressing well - will continue to progress towards LTGs.    Personal Factors and Comorbidities  Comorbidity 2;Comorbidity 3+    Comorbidities  HTN, DMII, CVA    Examination-Activity Limitations  Caring for Others;Stairs;Locomotion Level;Carry    Examination-Participation Restrictions  Community Activity;Medication Management;Laundry    Stability/Clinical Decision Making  Evolving/Moderate complexity    Rehab Potential  Excellent    PT Frequency  2x / week    PT Duration  8 weeks    PT Treatment/Interventions   ADLs/Self Care Home Management;Aquatic Therapy;DME Instruction;Gait training;Stair training;Functional mobility training;Therapeutic activities;Therapeutic exercise;Balance training;Neuromuscular re-education;Cognitive remediation;Patient/family education;Orthotic Fit/Training;Passive range of motion;Vestibular    PT Next Visit Plan  goals due 1/29. backwards gait and gait training/step length with no AD (work on dynamic gait tasks with no AD). Work on obstacles, around and over, continue with high level balance and strengthening. eyes closed balance on compliant surfaces (assess for dizziness).    PT Home Exercise Plan  [email or web address]    Consulted and Agree with Plan of Care  Patient;Other (Comment)   husband/pt through interpretor      Patient will benefit from skilled therapeutic intervention in order to improve the following deficits and impairments:  Abnormal gait, Decreased activity tolerance, Decreased balance, Decreased cognition, Decreased endurance, Decreased knowledge of precautions, Decreased mobility, Decreased strength, Decreased safety awareness, Impaired perceived functional ability, Impaired flexibility, Postural dysfunction  Visit Diagnosis: Muscle weakness (generalized)  Unsteadiness on feet  Other abnormalities of gait and mobility  Other lack of coordination     Problem List Patient Active Problem List   Diagnosis Date Noted  . DM (diabetes mellitus), type 2 ([facility])  11/15/2018  . Embolic stroke ([place]) 10/15/4959  . Urinary tract infection without hematuria   . E coli bacteremia 11/08/2018  . Cerebral embolism with cerebral infarction 11/07/2018  . Pneumonia   . Pyelonephritis   . Endotracheal tube present   . Septic shock ([place])   . Sepsis ([facility]) 11/01/2018  . Acute respiratory failure ([place]) 11/01/2018  . Endophthalmitis, acute 05/13/2017  . Healthcare maintenance 09/09/2016  . DM (diabetes mellitus) type 2, uncontrolled, with ketoacidosis ([facility]) 05/17/2014  . Heart  murmur 12/14/2013  . Cough 07/06/2013  . Hypertriglyceridemia 07/06/2013  . Type 2 diabetes mellitus without complication, with long-term current use of insulin ([facility]) 07/06/2013  . Essential hypertension, benign 12/22/2012  . Hyperglycemia 11/17/2012  . Hypertension, uncontrolled 11/17/2012    Arliss Journey, PT, DPT  02/01/2019, 12:23 PM  [facility] [address] [place], [place], [address] Phone: [phone]   Fax:  [phone]  Name: Aimee Parker MRN: [id number] Date of Birth: 12/19/1950

## 2019-02-01 NOTE — Patient Instructions (Signed)
Access Code: [email or web address]  URL: [email or web address]  Date: 02/01/2019  Prepared by: Sherlie Ban   Exercises Walking March - 3 reps - 1 sets - 2x daily - 7x weekly Side Stepping with Counter Support - 3 reps - 1 sets - 2x daily - 7x weekly Tandem Walking with Counter Support - 3 reps - 1 sets - 2x daily - 7x weekly Backward Walking with Counter Support - 3 reps - 1 sets - 2x daily - 7x weekly Toe Walking with Counter Support - 3 reps - 1 sets - 2x daily - 7x weekly Sit to Stand - 10 reps - 2 sets - 2x daily - 7x weekly Walking with Head Nod - 3 sets - 2x daily - 7x weekly Walking with Head Rotation - 3 sets - 2x daily - 7x weekly Romberg Stance with Eyes Closed - 3 sets - 20-30 hold - 2x daily - 7x weekly Supine Bridge - 10 reps - 2 sets - 2x daily - 5x weekly Clamshell with Resistance - 10 reps - 2 sets - 1x daily - 5x weekly

## 2019-02-03 ENCOUNTER — Ambulatory Visit: Payer: Medicare Other

## 2019-02-06 ENCOUNTER — Ambulatory Visit: Payer: Medicare Other

## 2019-02-06 ENCOUNTER — Other Ambulatory Visit: Payer: Self-pay

## 2019-02-06 ENCOUNTER — Encounter: Payer: Medicare Other | Admitting: Occupational Therapy

## 2019-02-06 DIAGNOSIS — R2689 Other abnormalities of gait and mobility: Secondary | ICD-10-CM

## 2019-02-06 DIAGNOSIS — R278 Other lack of coordination: Secondary | ICD-10-CM | POA: Diagnosis not present

## 2019-02-06 DIAGNOSIS — M6281 Muscle weakness (generalized): Secondary | ICD-10-CM

## 2019-02-06 NOTE — Therapy (Signed)
[facility] [address] [place], [place], [address] Phone: [phone]   Fax:  [phone]  Physical Therapy Treatment  Patient Details  Name: Aimee Parker MRN: [id number] Date of Birth: 07/20/1950 Referring Provider (PT): Alysia Penna, MD   Encounter Date: 02/06/2019  PT End of Session - 02/06/19 0934    Visit Number  11    Number of Visits  17    Date for PT Re-Evaluation  02/10/19    Authorization Type  [employer]HC Medicare/medicaid (10th visit progress note needed)    PT Start Time  0930    PT Stop Time  1014    PT Time Calculation (min)  44 min    Equipment Utilized During Treatment  Gait belt   vietnamese interpreter service   Activity Tolerance  Patient tolerated treatment well    Behavior During Therapy  [facility] for tasks assessed/performed       Past Medical History:  Diagnosis Date  . Diabetes mellitus without complication ([place])   . Hypertension   . Vitamin D deficiency     Past Surgical History:  Procedure Laterality Date  . EYE SURGERY     Left eye surgery  . PARS PLANA VITRECTOMY Right 05/13/2017   Procedure: PARS PLANA VITRECTOMY 25 GAUGE FOR ENDOPHTHALMITIS;  Surgeon: Hurman Horn, MD;  Location: [facility];  Service: Ophthalmology;  Laterality: Right;    There were no vitals filed for this visit.  Subjective Assessment - 02/06/19 0934    Subjective  Pt reports that she is doing well. Has been walking everyday at home.    Patient is accompained by:  Family member;Interpreter   [name] (pronounced)   Pertinent History  HTN, DMII    Limitations  Walking;House hold activities    Patient Stated Goals  "I want to be like I was before."    Currently in Pain?  No/denies                       [facility] Adult PT Treatment/Exercise - 02/06/19 0935      Transfers   Transfers  Sit to Stand;Stand to Sit    Sit to Stand  7: Independent    Stand to Sit  7: Independent      Ambulation/Gait   Ambulation/Gait  Yes    Ambulation/Gait Assistance  5: Supervision    Ambulation/Gait Assistance Details  Pt was cued to take large steps and let arms swing    Ambulation Distance (Feet)  575 Feet    Assistive device  None    Gait Pattern  Step-through pattern    Ambulation Surface  Level;Indoor    Gait velocity  5.8 sec in 20 ft=3.53f/sec or 1.062m      Neuro Re-ed    Neuro Re-ed Details   Dynamic gait activities: marching gait over mat 6', weaving in and out of 4 cones, stepping over 4 hurdles with reciprocal pattern. Performed x 4 laps supervision. Pt was cued to increase step length with weaving as initially very short shuffled steps. Side stepping on mat 6' x 6, side stepping over 4 cones with verbal cues and demonstration for form. In hallway: marching gait 30' x 2, backwards gait 30' x 2. Pt reported being a little tired after and needing to sit a minute. Pt stood on rockerboard positioned ant/post trying to maintain level. Was challenged with this. Tried closing eyes but multiple LOB needing min assist, eyes open with reaching x 30  sec. Denied any dizziness.      Exercises   Exercises  Other Exercises    Other Exercises   Bridges x 10, sidelying clamshell x 10 bilateral with PT providing manual resistance. Pt needed some cuing not to roll back on left as was more difficult.               PT Short Term Goals - 01/16/19 1026      PT SHORT TERM GOAL #1   Title  Pt will be ind with initial HEP in order to improve balance and functional mobility.  (Target Date: 01/11/19)    Baseline  pt reports she is independent with HEP    Time  4    Period  Weeks    Status  Achieved    Target Date  01/11/19      PT SHORT TERM GOAL #2   Title  Pt will improve BERG balance score to 46/56 in order to indicate decreased fall risk.    Baseline  47/56    Status  Achieved      PT SHORT TERM GOAL #3   Title  Pt will improve gait speed to >/=2.62 ft/sec with LRAD in order to indicate safe  community ambulation.    Baseline  16.22 secs = 2.02  ft/sec, 16.34 sec = 2.01 ft/sec with SPC    Status  Not Met      PT SHORT TERM GOAL #4   Title  Pt will perform 5TSS in </=11 secs without UE support and no reliance of LEs on chair for support to indicate improved functional strength.    Baseline  11.59 seconds without UE support.    Status  Partially Met      PT SHORT TERM GOAL #5   Title  Pt will ambulate x 100' at mod I level in home without AD in order to indicate improved independence in home.    Baseline  x100' with supervision with no AD    Status  Partially Met      PT SHORT TERM GOAL #6   Title  Pt will ambulate x 500' w/ LRAD at S level over unlevel paved surfaces in order to indicate safe community ambulation.    Baseline  able to ambulate 300' with [facility] outsdoors with supervision (unable to perform full 500' due to weather)    Status  Partially Met        PT Long Term Goals - 01/23/19 1219      PT LONG TERM GOAL #1   Title  Pt will be ind with final HEP in order to indicate decreased fall risk and improved functional mobility (Target Date: 02/10/2019)    Time  8    Period  Weeks    Status  New      PT LONG TERM GOAL #2   Title  Pt will improve BERG balance score to >/=50/56 in order to indicate decreased fall risk.      PT LONG TERM GOAL #3   Title  Pt will ambulate at gait speed of </=2.62 ft/sec without AD in order to indicate safe and more independent community ambulation.      PT LONG TERM GOAL #4   Title  Pt will ambulate over varying outdoor surfaces x 1000' without AD at S level in order to indicate more independent community negotiation.      PT LONG TERM GOAL #5   Title  Patient will improve FGA score to at least  an 18/30 in order to demo decr fall risk.    Baseline  14/30 on 01/23/19    Status  Revised            Plan - 02/06/19 1224    Clinical Impression Statement  Pt was able to demonstrate good carryover from last session with larger  steps and reciprocal arm swing. Was challenged with standing on rockerboard and with eyes closed with decreased stability.    Personal Factors and Comorbidities  Comorbidity 2;Comorbidity 3+    Comorbidities  HTN, DMII, CVA    Examination-Activity Limitations  Caring for Others;Stairs;Locomotion Level;Carry    Examination-Participation Restrictions  Community Activity;Medication Management;Laundry    Stability/Clinical Decision Making  Evolving/Moderate complexity    Rehab Potential  Excellent    PT Frequency  2x / week    PT Duration  8 weeks    PT Treatment/Interventions  ADLs/Self Care Home Management;Aquatic Therapy;DME Instruction;Gait training;Stair training;Functional mobility training;Therapeutic activities;Therapeutic exercise;Balance training;Neuromuscular re-education;Cognitive remediation;Patient/family education;Orthotic Fit/Training;Passive range of motion;Vestibular    PT Next Visit Plan  Goals due next visit for recert? backwards gait and gait training/step length with no AD (work on dynamic gait tasks with no AD). Work on obstacles, around and over, continue with high level balance and strengthening. eyes closed balance on compliant surfaces (assess for dizziness).    PT Home Exercise Plan  [email or web address]    Consulted and Agree with Plan of Care  Patient;Other (Comment)   husband/pt through interpretor      Patient will benefit from skilled therapeutic intervention in order to improve the following deficits and impairments:  Abnormal gait, Decreased activity tolerance, Decreased balance, Decreased cognition, Decreased endurance, Decreased knowledge of precautions, Decreased mobility, Decreased strength, Decreased safety awareness, Impaired perceived functional ability, Impaired flexibility, Postural dysfunction  Visit Diagnosis: Muscle weakness (generalized)  Other abnormalities of gait and mobility     Problem List Patient Active Problem List   Diagnosis Date Noted  . DM  (diabetes mellitus), type 2 ([facility]) 11/15/2018  . Embolic stroke ([place]) 82/64/1583  . Urinary tract infection without hematuria   . E coli bacteremia 11/08/2018  . Cerebral embolism with cerebral infarction 11/07/2018  . Pneumonia   . Pyelonephritis   . Endotracheal tube present   . Septic shock ([place])   . Sepsis ([place]) 11/01/2018  . Acute respiratory failure ([place]) 11/01/2018  . Endophthalmitis, acute 05/13/2017  . Healthcare maintenance 09/09/2016  . DM (diabetes mellitus) type 2, uncontrolled, with ketoacidosis ([place]) 05/17/2014  . Heart murmur 12/14/2013  . Cough 07/06/2013  . Hypertriglyceridemia 07/06/2013  . Type 2 diabetes mellitus without complication, with long-term current use of insulin ([place]) 07/06/2013  . Essential hypertension, benign 12/22/2012  . Hyperglycemia 11/17/2012  . Hypertension, uncontrolled 11/17/2012    Electa Sniff, PT, DPT, NCS 02/06/2019, 12:27 PM  [facility] [address] [place], [place], [address] Phone: [phone]   Fax:  [phone]  Name: Tritia Endo MRN: [id number] Date of Birth: [birth year]

## 2019-02-08 ENCOUNTER — Ambulatory Visit: Payer: Medicare Other | Admitting: Physical Therapy

## 2019-02-08 ENCOUNTER — Ambulatory Visit: Payer: Medicare Other | Admitting: Occupational Therapy

## 2019-02-08 ENCOUNTER — Other Ambulatory Visit: Payer: Self-pay

## 2019-02-08 ENCOUNTER — Encounter: Payer: Self-pay | Admitting: Occupational Therapy

## 2019-02-08 VITALS — BP 123/54 | HR 70

## 2019-02-08 DIAGNOSIS — I69318 Other symptoms and signs involving cognitive functions following cerebral infarction: Secondary | ICD-10-CM

## 2019-02-08 DIAGNOSIS — R278 Other lack of coordination: Secondary | ICD-10-CM

## 2019-02-08 DIAGNOSIS — M6281 Muscle weakness (generalized): Secondary | ICD-10-CM

## 2019-02-08 DIAGNOSIS — R2689 Other abnormalities of gait and mobility: Secondary | ICD-10-CM

## 2019-02-08 DIAGNOSIS — R2681 Unsteadiness on feet: Secondary | ICD-10-CM

## 2019-02-08 NOTE — Therapy (Signed)
[facility] [address] [place], [place], [address] Phone: [phone]   Fax:  [phone]  Occupational Therapy Treatment  Patient Details  Name: Aimee Parker MRN: [id number] Date of Birth: 23-Jul-1950 Referring Provider (OT): Dr. Alysia Penna   Encounter Date: 02/08/2019  OT End of Session - 02/08/19 0940    Visit Number  10    Number of Visits  17    Date for OT Re-Evaluation  02/06/19    Authorization Type  UHC MCR primary, MCD secondary    Authorization - Visit Number  10    Authorization - Number of Visits  10    OT Start Time  [phone]    OT Stop Time  1015    OT Time Calculation (min)  38 min    Activity Tolerance  Patient tolerated treatment well    Behavior During Therapy  [facility] for tasks assessed/performed       Past Medical History:  Diagnosis Date  . Diabetes mellitus without complication ([place])   . Hypertension   . Vitamin D deficiency     Past Surgical History:  Procedure Laterality Date  . EYE SURGERY     Left eye surgery  . PARS PLANA VITRECTOMY Right 05/13/2017   Procedure: PARS PLANA VITRECTOMY 25 GAUGE FOR ENDOPHTHALMITIS;  Surgeon: Hurman Horn, MD;  Location: [facility];  Service: Ophthalmology;  Laterality: Right;    There were no vitals filed for this visit.  Subjective Assessment - 02/08/19 0933    Subjective   Pt reports dizzziness when looking up or down.  Able to do laundry now.    Pertinent History  CVA 10/2018. Admitted for septic shock on 11/01/18 and intubated 11/02/18. MRI showed CVA on 11/06/18.    Limitations  fall risk, needs interpreter    Patient Stated Goals  get balance better    Currently in Pain?  No/denies         Picking up blocks using gripper set at level 2 with black spring for sustained grip strength with min difficulty.  Arm bike x36mn level 1 for reciprocal movement/conditioning without rest (forward/backwards).  Pt reported mild dizziness that quickly  resolved after this activity, but denied dizziness the rest of the session.  Standing, wt. Shifts forward/back for improved balance for ADLs while tossing beanbags to target with min-mod cueing, particularly initially. (use of 1 UE support)  Ambulating (close supervision) with bowl and cup (1 in each hand) with close supervision and min cueing for larger steps for improved balance as pt tends to ambulate with short step length affecting balance.  Standing and then Ambulating (close supervision)in small area to retrieve items from various locations with min v.c. for incr safety/technique, particularly with retrieving items from floor (recommended use of 1 UE for support), no dizziness reported.  Standing, functional reaching with RUE to place/remove clothespins with 1-8lb resistance on vertical pole incorporating lateral wt. Shifts with no LOB.    Standing, performing diagonals with BUEs with ball for lateral wt. Shifts/trunk rotations with no dizziness reported.        OT Short Term Goals - 02/08/19 02876     OT SHORT TERM GOAL #1   Title  Independent with HEP for BUE arm strengthening, Rt hand strengthening, and LUE gross motor coordination - extended to 01/20/2019 due to missed visits    Time  4    Period  Weeks    Status  Achieved  OT SHORT TERM GOAL #2   Title  Pt to bathe self w/ supervision and A/E prn    Baseline  min assist    Time  4    Period  Weeks    Status  Achieved      OT SHORT TERM GOAL #3   Title  Pt to improve Rt grip strength to 30 lbs for opening jars/containers    Baseline  22 lbs (Lt = 35 lbs)    Time  4    Period  Weeks    Status  Achieved   01/16/2019  27 pounds.  02/08/19:  30.6lbs     OT SHORT TERM GOAL #4   Title  Pt to stand to fold towels and wash dishes for 10 minutes w/o rest    Time  4    Period  Weeks    Status  Achieved   01/16/2019  15 minutes then rated fatigue 4/10     OT SHORT TERM GOAL #5   Title  Pt to make simple snack/sandwich w/  supervision    Time  4    Period  Weeks    Status  Achieved        OT Long Term Goals - 01/30/19 1251      OT LONG TERM GOAL #1   Title  Pt to return to simple cooking tasks at mod I level    Time  8    Period  Weeks    Status  On-going      OT LONG TERM GOAL #2   Title  Pt to return to light household maintence/cleaning and laundry tasks mod I level    Time  8    Period  Weeks    Status  On-going   01/25/19 - pt cooking daily, can wash dishes/sweep/do laundry on "good days" - 4/7 days per week. Husband does assist with heavy items when cooking     OT LONG TERM GOAL #3   Title  Pt to attend to Rt side and perform tabletop visual scanning without cues    Time  8    Period  Weeks    Status  Achieved      OT LONG TERM GOAL #4   Title  Pt to demo sufficient strength BUE's to put groceries away and retrieve/replace items up to 3 lbs on high shelf    Time  8    Period  Weeks    Status  Achieved   able to lift 5 pound object x3 each UE           Plan - 02/08/19 0941    Clinical Impression Statement  Reporting period:  12/07/18-02/08/19.   Pt is making good progress towards goals with all 5 STGs met and 2/4 LTGs met.  Pt would benefit from continued occupational therapy to incr safety and independence for return to previous IADLs.    Occupational performance deficits (Please refer to evaluation for details):  ADL's;IADL's    Body Structure / Function / Physical Skills  ADL;IADL;Body mechanics;Balance;Mobility;Sensation;Coordination;UE functional use;Decreased knowledge of use of DME;Proprioception    Cognitive Skills  Attention;Memory;Thought;Understand    Rehab Potential  Good    OT Frequency  2x / week    OT Duration  8 weeks    OT Treatment/Interventions  Self-care/ADL training;Therapeutic exercise;Functional Mobility Training;Neuromuscular education;Manual Therapy;Therapeutic activities;DME and/or AE instruction;Cognitive remediation/compensation;Visual/perceptual  remediation/compensation;Moist Heat;Passive range of motion;Patient/family education    Plan  continue to work towards remaining goals/IADLs  Consulted and Agree with Plan of Care  Patient;Family member/caregiver    Family Member Consulted  husband       Patient will benefit from skilled therapeutic intervention in order to improve the following deficits and impairments:   Body Structure / Function / Physical Skills: ADL, IADL, Body mechanics, Balance, Mobility, Sensation, Coordination, UE functional use, Decreased knowledge of use of DME, Proprioception Cognitive Skills: Attention, Memory, Thought, Understand     Visit Diagnosis: Muscle weakness (generalized)  Other abnormalities of gait and mobility  Unsteadiness on feet  Other lack of coordination  Other symptoms and signs involving cognitive functions following cerebral infarction    Problem List Patient Active Problem List   Diagnosis Date Noted  . DM (diabetes mellitus), type 2 ([place]) 11/15/2018  . Embolic stroke ([place]) 77/95/6462  . Urinary tract infection without hematuria   . E coli bacteremia 11/08/2018  . Cerebral embolism with cerebral infarction 11/07/2018  . Pneumonia   . Pyelonephritis   . Endotracheal tube present   . Septic shock ([place])   . Sepsis ([place]) 11/01/2018  . Acute respiratory failure ([place]) 11/01/2018  . Endophthalmitis, acute 05/13/2017  . Healthcare maintenance 09/09/2016  . DM (diabetes mellitus) type 2, uncontrolled, with ketoacidosis ([place]) 05/17/2014  . Heart murmur 12/14/2013  . Cough 07/06/2013  . Hypertriglyceridemia 07/06/2013  . Type 2 diabetes mellitus without complication, with long-term current use of insulin ([place]) 07/06/2013  . Essential hypertension, benign 12/22/2012  . Hyperglycemia 11/17/2012  . Hypertension, uncontrolled 11/17/2012    [facility] 02/08/2019, 12:09 PM  [facility] [address] [place], [place], [address] Phone: [phone]   Fax:  [phone]  Name: Verbie Babic MRN: [id number] Date of Birth: 1950-02-19   Vianne Bulls, OTR/L [facility] [address] [place], [place]  [address] [phone] phone [phone] 02/08/19 12:13 PM

## 2019-02-08 NOTE — Therapy (Signed)
[facility] [address] [place], [place], [address] Phone: [phone]   Fax:  [phone]  Physical Therapy Treatment/Re-Cert   Patient Details  Name: Aimee Parker MRN: [id number] Date of Birth: [birth year] Referring Provider (PT): Alysia Penna, MD   Encounter Date: 02/08/2019  PT End of Session - 02/08/19 1144    Visit Number  12    Number of Visits  20    Date for PT Re-Evaluation  04/09/19   written for 4 week POC   Authorization Type  UHC Medicare/medicaid (10th visit progress note needed)    PT Start Time  1018    PT Stop Time  1101    PT Time Calculation (min)  43 min    Equipment Utilized During Treatment  Gait belt   vietnamese interpreter service   Activity Tolerance  Patient tolerated treatment well    Behavior During Therapy  [facility] for tasks assessed/performed       Past Medical History:  Diagnosis Date  . Diabetes mellitus without complication ([place])   . Hypertension   . Vitamin D deficiency     Past Surgical History:  Procedure Laterality Date  . EYE SURGERY     Left eye surgery  . PARS PLANA VITRECTOMY Right 05/13/2017   Procedure: PARS PLANA VITRECTOMY 25 GAUGE FOR ENDOPHTHALMITIS;  Surgeon: Hurman Horn, MD;  Location: [facility];  Service: Ophthalmology;  Laterality: Right;    Vitals:   02/08/19 1044  BP: (!) 123/54  Pulse: 70    Subjective Assessment - 02/08/19 1020    Subjective  Got a little dizzy when using the arm bike with OT. Walking is going well at home.    Patient is accompained by:  Family member;Interpreter   [name] (pronounced)   Pertinent History  HTN, DMII    Limitations  Walking;House hold activities    Patient Stated Goals  "I want to be like I was before."    Currently in Pain?  No/denies         [facility] PT Assessment - 02/08/19 1022      Berg Balance Test   Sit to Stand  Able to stand without using hands and stabilize independently    Standing Unsupported   Able to stand safely 2 minutes    Sitting with Back Unsupported but Feet Supported on Floor or Stool  Able to sit safely and securely 2 minutes    Stand to Sit  Sits safely with minimal use of hands    Transfers  Able to transfer safely, minor use of hands    Standing Unsupported with Eyes Closed  Able to stand 10 seconds safely    Standing Unsupported with Feet Together  Able to place feet together independently and stand 1 minute safely    From Standing, Reach Forward with Outstretched Arm  Can reach forward >12 cm safely (5")    From Standing Position, Pick up Object from Floor  Able to pick up shoe safely and easily    From Standing Position, Turn to Look Behind Over each Shoulder  Looks behind from both sides and weight shifts well    Turn 360 Degrees  Able to turn 360 degrees safely but slowly   short shuffled steps   Standing Unsupported, Alternately Place Feet on Step/Stool  Able to stand independently and safely and complete 8 steps in 20 seconds    Standing Unsupported, One Foot in Front  Able to plae foot ahead of the  other independently and hold 30 seconds    Standing on One Leg  Able to lift leg independently and hold equal to or more than 3 seconds    Total Score  50    Berg comment:  50/56      Functional Gait  Assessment   Gait assessed   Yes    Gait Level Surface  Walks 20 ft in less than 7 sec but greater than 5.5 sec, uses assistive device, slower speed, mild gait deviations, or deviates 6-10 in outside of the 12 in walkway width.    Change in Gait Speed  Able to change speed, demonstrates mild gait deviations, deviates 6-10 in outside of the 12 in walkway width, or no gait deviations, unable to achieve a major change in velocity, or uses a change in velocity, or uses an assistive device.    Gait with Horizontal Head Turns  Performs head turns smoothly with slight change in gait velocity (eg, minor disruption to smooth gait path), deviates 6-10 in outside 12 in walkway width,  or uses an assistive device.    Gait with Vertical Head Turns  Performs task with slight change in gait velocity (eg, minor disruption to smooth gait path), deviates 6 - 10 in outside 12 in walkway width or uses assistive device    Gait and Pivot Turn  Pivot turns safely within 3 sec and stops quickly with no loss of balance.    Step Over Obstacle  Is able to step over one shoe box (4.5 in total height) without changing gait speed. No evidence of imbalance.    Gait with Narrow Base of Support  Ambulates less than 4 steps heel to toe or cannot perform without assistance.    Gait with Eyes Closed  Walks 20 ft, slow speed, abnormal gait pattern, evidence for imbalance, deviates 10-15 in outside 12 in walkway width. Requires more than 9 sec to ambulate 20 ft.    Ambulating Backwards  Walks 20 ft, slow speed, abnormal gait pattern, evidence for imbalance, deviates 10-15 in outside 12 in walkway width.   slow shuffled steps   Steps  Alternating feet, must use rail.    Total Score  17    FGA comment:  17/30                   [facility] Adult PT Treatment/Exercise - 02/08/19 0001      Ambulation/Gait   Ambulation/Gait  Yes    Ambulation/Gait Assistance  5: Supervision    Ambulation/Gait Assistance Details  initial cueing for larger step length     Ambulation Distance (Feet)  230 Feet    Assistive device  None    Gait Pattern  Step-through pattern    Ambulation Surface  Level;Indoor    Stairs  Yes    Stairs Assistance  5: Supervision    Stair Management Technique  One rail Right;Forwards;Alternating pattern    Number of Stairs  8    Height of Stairs  6    Pre-Gait Activities  focused on turns - 1 x 10 reps B taking large lateral steps out to the side progressing to large step out and turning body and stepping other LE to meet, 1 x 10 reps, supervision. focused on large step lengths as during BERG, pt with small shuffled steps when turning     Gait Comments  2 x 30' down and back retro gait  with supervision by therapist - cues for step length B, needed to  rest afterwards due to pt reporting 2/10 dizziness, pt needing to lean up against the wall.             PT Education - 02/08/19 1144    Education Details  progress towards goals    Person(s) Educated  Patient;Spouse    Methods  Explanation    Comprehension  Verbalized understanding       PT Short Term Goals - 01/16/19 1026      PT SHORT TERM GOAL #1   Title  Pt will be ind with initial HEP in order to improve balance and functional mobility.  (Target Date: 01/11/19)    Baseline  pt reports she is independent with HEP    Time  4    Period  Weeks    Status  Achieved    Target Date  01/11/19      PT SHORT TERM GOAL #2   Title  Pt will improve BERG balance score to 46/56 in order to indicate decreased fall risk.    Baseline  47/56    Status  Achieved      PT SHORT TERM GOAL #3   Title  Pt will improve gait speed to >/=2.62 ft/sec with LRAD in order to indicate safe community ambulation.    Baseline  16.22 secs = 2.02  ft/sec, 16.34 sec = 2.01 ft/sec with SPC    Status  Not Met      PT SHORT TERM GOAL #4   Title  Pt will perform 5TSS in </=11 secs without UE support and no reliance of LEs on chair for support to indicate improved functional strength.    Baseline  11.59 seconds without UE support.    Status  Partially Met      PT SHORT TERM GOAL #5   Title  Pt will ambulate x 100' at mod I level in home without AD in order to indicate improved independence in home.    Baseline  x100' with supervision with no AD    Status  Partially Met      PT SHORT TERM GOAL #6   Title  Pt will ambulate x 500' w/ LRAD at S level over unlevel paved surfaces in order to indicate safe community ambulation.    Baseline  able to ambulate 300' with [facility] outsdoors with supervision (unable to perform full 500' due to weather)    Status  Partially Met        PT Long Term Goals - 02/08/19 1030      PT LONG TERM GOAL #1    Title  Pt will be ind with final HEP in order to indicate decreased fall risk and improved functional mobility (Target Date: 02/10/2019)    Baseline  on-going with HEP    Time  8    Period  Weeks    Status  On-going      PT LONG TERM GOAL #2   Title  Pt will improve BERG balance score to >/=50/56 in order to indicate decreased fall risk.    Baseline  50/56 on 02/08/19    Status  Achieved      PT LONG TERM GOAL #3   Title  Pt will ambulate at gait speed of </=2.62 ft/sec without AD in order to indicate safe and more independent community ambulation.    Baseline  3.44 ft/sec    Status  Achieved      PT LONG TERM GOAL #4   Title  Pt will ambulate over varying  outdoor surfaces x 1000' without AD at S level in order to indicate more independent community negotiation.    Baseline  unable to assess today due to time constraints.    Status  On-going      PT LONG TERM GOAL #5   Title  Patient will improve FGA score to at least an 18/30 in order to demo decr fall risk.    Baseline  17/30 on 02/08/19    Status  Not Met        Revised/on-going LTGs for re-cert: PT Long Term Goals - 02/08/19 1158      PT LONG TERM GOAL #1   Title  Pt will be ind with final HEP in order to indicate decreased fall risk and improved functional mobility (ALL LTGS DUE 03/08/19)    Baseline  pt will continue to benefit from ongoing revisions to HEP for strength and balance    Time  4    Period  Weeks    Status  On-going    Target Date  03/08/19      PT LONG TERM GOAL #2   Title  Pt will improve BERG balance score to >/=52/56 in order to indicate decreased fall risk.    Baseline  50/56 on 02/08/19    Time  4    Period  Weeks    Status  Revised      PT LONG TERM GOAL #3   Title  Pt will ambulate over varying outdoor surfaces x 1000' without AD at S level in order to indicate more independent community negotiation.    Time  4    Period  Weeks    Status  On-going      PT LONG TERM GOAL #4   Title  Patient  will improve FGA score to at least a 21/30 in order to demo decr fall risk.    Baseline  17/30 on 02/08/19    Status  Revised          Plan - 02/08/19 1152    Clinical Impression Statement  Focus of today's skilled session was assessing LTGs for re-cert. Pt has met 2 out of 5 LTGs in regards to gait speed and BERG. Pt improved her BERG score to a 50/56, indicating a decr risk for falls and pt's gait speed increased to 3.44 ft/sec indicating a community ambulator with no AD. Pt scored a 17/30 today on the FGA with no AD, indicating pt remains at a high risk of falls. HEP is on-going and did not assess gait outdoors today due to time constraints. Pt with reports of 2/10 dizziness throughout today's session during FGA (most notably with head nods up and down) and retro walking. Able to subside with standing and seated rest break. Revised LTGs as appropriate. Will re-cert for 2x week for an additional 4 weeks. Pt will continue to benefit from skilled PT to increase safety, improve gait mechanics and balance for improved independence and community mobility.    Personal Factors and Comorbidities  Comorbidity 2;Comorbidity 3+    Comorbidities  HTN, DMII, CVA    Examination-Activity Limitations  Caring for Others;Stairs;Locomotion Level;Carry    Examination-Participation Restrictions  Community Activity;Medication Management;Laundry    Stability/Clinical Decision Making  Evolving/Moderate complexity    Rehab Potential  Excellent    PT Frequency  2x / week    PT Duration  4 weeks    PT Treatment/Interventions  ADLs/Self Care Home Management;Aquatic Therapy;DME Instruction;Gait training;Stair training;Functional mobility training;Therapeutic activities;Therapeutic exercise;Balance training;Neuromuscular  re-education;Cognitive remediation;Patient/family education;Orthotic Fit/Training;Passive range of motion;Vestibular    PT Next Visit Plan  add to HEP- eyes closed. backwards gait and gait training/step  length with no AD (work on dynamic gait tasks with no AD). Work on obstacles, around and over, continue with high level balance and strengthening. eyes closed balance on compliant surfaces (assess for dizziness).    PT Home Exercise Plan  [id number]    Consulted and Agree with Plan of Care  Patient;Other (Comment)   husband/pt through interpretor      Patient will benefit from skilled therapeutic intervention in order to improve the following deficits and impairments:  Abnormal gait, Decreased activity tolerance, Decreased balance, Decreased cognition, Decreased endurance, Decreased knowledge of precautions, Decreased mobility, Decreased strength, Decreased safety awareness, Impaired perceived functional ability, Impaired flexibility, Postural dysfunction  Visit Diagnosis: Other abnormalities of gait and mobility  Unsteadiness on feet  Other lack of coordination  Muscle weakness (generalized)     Problem List Patient Active Problem List   Diagnosis Date Noted  . DM (diabetes mellitus), type 2 ([place]) 11/15/2018  . Embolic stroke ([place]) 01/00/7121  . Urinary tract infection without hematuria   . E coli bacteremia 11/08/2018  . Cerebral embolism with cerebral infarction 11/07/2018  . Pneumonia   . Pyelonephritis   . Endotracheal tube present   . Septic shock ([place])   . Sepsis ([place]) 11/01/2018  . Acute respiratory failure ([place]) 11/01/2018  . Endophthalmitis, acute 05/13/2017  . Healthcare maintenance 09/09/2016  . DM (diabetes mellitus) type 2, uncontrolled, with ketoacidosis ([place]) 05/17/2014  . Heart murmur 12/14/2013  . Cough 07/06/2013  . Hypertriglyceridemia 07/06/2013  . Type 2 diabetes mellitus without complication, with long-term current use of insulin ([place]) 07/06/2013  . Essential hypertension, benign 12/22/2012  . Hyperglycemia 11/17/2012  . Hypertension, uncontrolled 11/17/2012    Arliss Journey, PT, DPT  02/08/2019, 11:58 AM  [facility] [address] [place], [place], [address] Phone: [phone]   Fax:  [phone]  Name: Pailynn Vahey MRN: [id number] Date of Birth: 1950-04-22

## 2019-02-13 ENCOUNTER — Ambulatory Visit: Payer: Medicare Other | Attending: Physical Medicine & Rehabilitation | Admitting: Occupational Therapy

## 2019-02-13 ENCOUNTER — Ambulatory Visit: Payer: Medicare Other | Admitting: Physical Therapy

## 2019-02-13 ENCOUNTER — Other Ambulatory Visit: Payer: Self-pay

## 2019-02-13 DIAGNOSIS — R2689 Other abnormalities of gait and mobility: Secondary | ICD-10-CM

## 2019-02-13 DIAGNOSIS — M6281 Muscle weakness (generalized): Secondary | ICD-10-CM

## 2019-02-13 DIAGNOSIS — R278 Other lack of coordination: Secondary | ICD-10-CM | POA: Insufficient documentation

## 2019-02-13 DIAGNOSIS — R2681 Unsteadiness on feet: Secondary | ICD-10-CM | POA: Diagnosis not present

## 2019-02-13 NOTE — Therapy (Signed)
[facility] [address] [place], [place], [address] Phone: [phone]   Fax:  [phone]  Physical Therapy Treatment  Patient Details  Name: Aimee Parker MRN: [id number] Date of Birth: Aug 15, 1950 Referring Provider (PT): Alysia Penna, MD   Encounter Date: 02/13/2019  PT End of Session - 02/13/19 1208    Visit Number  13    Number of Visits  20    Date for PT Re-Evaluation  04/09/19   written for 4 week POC   Authorization Type  UHC Medicare/medicaid (10th visit progress note needed)    PT Start Time  0932    PT Stop Time  1013    PT Time Calculation (min)  41 min    Equipment Utilized During Treatment  Gait belt   vietnamese interpreter service   Activity Tolerance  Patient tolerated treatment well    Behavior During Therapy  [facility] for tasks assessed/performed       Past Medical History:  Diagnosis Date  . Diabetes mellitus without complication ([place])   . Hypertension   . Vitamin D deficiency     Past Surgical History:  Procedure Laterality Date  . EYE SURGERY     Left eye surgery  . PARS PLANA VITRECTOMY Right 05/13/2017   Procedure: PARS PLANA VITRECTOMY 25 GAUGE FOR ENDOPHTHALMITIS;  Surgeon: Hurman Horn, MD;  Location: [facility];  Service: Ophthalmology;  Laterality: Right;    There were no vitals filed for this visit.  Subjective Assessment - 02/13/19 0935    Subjective  Doing good. Feels like her walking feels like it is back to normal. Did not have any dizziness with OT.    Patient is accompained by:  Family member;Interpreter   [name] (pronounced)   Pertinent History  HTN, DMII    Limitations  Walking;House hold activities    Patient Stated Goals  "I want to be like I was before."    Currently in Pain?  No/denies                       [facility] Adult PT Treatment/Exercise - 02/13/19 0001      Ambulation/Gait   Ambulation/Gait  Yes    Ambulation/Gait Assistance  5:  Supervision    Ambulation/Gait Assistance Details  cues for foot clearance and initial cues for step length. pt with no LOB outdoors ambulating over pavement      Ambulation Distance (Feet)  500 Feet    Assistive device  None    Gait Pattern  Step-through pattern    Ambulation Surface  Unlevel;Outdoor;Paved    Stairs  Yes    Stairs Assistance  5: Supervision;4: Min guard    Stairs Assistance Details (indicate cue type and reason)  2 reps ascending step over step with no handrail, 2 reps descending with no handrail and performing step over step with min guard for safety, pt performs more cautiously     Stair Management Technique  One rail Right;Forwards;Alternating pattern;No rails    Number of Stairs  12    Height of Stairs  6      Exercises   Exercises  Other Exercises    Other Exercises   On 4" step: forward eccentric step taps with tapping heel down to floor, needed UE support in // bars and demonstrative cues to perform correctly 1 x 10 reps B. For balance on blue foam: 1 x 15 reps B forward heel taps to mimic descending stairs. Step  ups onto 6" step: 2 x 10 reps LLE - 1 set single UE support, additional set no UE support, 1 x 10 reps RLE with single UE support, performed an additional 3 reps with no UE support on RLE before pt had 2/10, needing to rest and cease activity                PT Short Term Goals - 01/16/19 1026      PT SHORT TERM GOAL #1   Title  Pt will be ind with initial HEP in order to improve balance and functional mobility.  (Target Date: 01/11/19)    Baseline  pt reports she is independent with HEP    Time  4    Period  Weeks    Status  Achieved    Target Date  01/11/19      PT SHORT TERM GOAL #2   Title  Pt will improve BERG balance score to 46/56 in order to indicate decreased fall risk.    Baseline  47/56    Status  Achieved      PT SHORT TERM GOAL #3   Title  Pt will improve gait speed to >/=2.62 ft/sec with LRAD in order to indicate safe community  ambulation.    Baseline  16.22 secs = 2.02  ft/sec, 16.34 sec = 2.01 ft/sec with SPC    Status  Not Met      PT SHORT TERM GOAL #4   Title  Pt will perform 5TSS in </=11 secs without UE support and no reliance of LEs on chair for support to indicate improved functional strength.    Baseline  11.59 seconds without UE support.    Status  Partially Met      PT SHORT TERM GOAL #5   Title  Pt will ambulate x 100' at mod I level in home without AD in order to indicate improved independence in home.    Baseline  x100' with supervision with no AD    Status  Partially Met      PT SHORT TERM GOAL #6   Title  Pt will ambulate x 500' w/ LRAD at S level over unlevel paved surfaces in order to indicate safe community ambulation.    Baseline  able to ambulate 300' with [facility] outsdoors with supervision (unable to perform full 500' due to weather)    Status  Partially Met        PT Long Term Goals - 02/08/19 1158      PT LONG TERM GOAL #1   Title  Pt will be ind with final HEP in order to indicate decreased fall risk and improved functional mobility (ALL LTGS DUE 03/08/19)    Baseline  pt will continue to benefit from ongoing revisions to HEP for strength and balance    Time  4    Period  Weeks    Status  On-going    Target Date  03/08/19      PT LONG TERM GOAL #2   Title  Pt will improve BERG balance score to >/=52/56 in order to indicate decreased fall risk.    Baseline  50/56 on 02/08/19    Time  4    Period  Weeks    Status  Revised      PT LONG TERM GOAL #3   Title  Pt will ambulate over varying outdoor surfaces x 1000' without AD at S level in order to indicate more independent community negotiation.  Time  4    Period  Weeks    Status  On-going      PT LONG TERM GOAL #4   Title  Patient will improve FGA score to at least a 21/30 in order to demo decr fall risk.    Baseline  17/30 on 02/08/19    Status  Revised            Plan - 02/13/19 1213    Clinical Impression  Statement  Focus of today's skilled session was gait training, stair training, and functional LE strengthening for ascending and descending stairs. Ambulated outdoors over paved surfaces with no AD- pt requiring intermittent cues to clear BLE, as pt has tendency to have foot scuffing. Pt more fearful when descending stairs - performs with single handrail and step over step pattern. While performing RLE step ups onto 6" step, pt had 2/10 dizziness after a couple reps. Subsided after taking a standing rest break.    Personal Factors and Comorbidities  Comorbidity 2;Comorbidity 3+    Comorbidities  HTN, DMII, CVA    Examination-Activity Limitations  Caring for Others;Stairs;Locomotion Level;Carry    Examination-Participation Restrictions  Community Activity;Medication Management;Laundry    Stability/Clinical Decision Making  Evolving/Moderate complexity    Rehab Potential  Excellent    PT Frequency  2x / week    PT Duration  4 weeks    PT Treatment/Interventions  ADLs/Self Care Home Management;Aquatic Therapy;DME Instruction;Gait training;Stair training;Functional mobility training;Therapeutic activities;Therapeutic exercise;Balance training;Neuromuscular re-education;Cognitive remediation;Patient/family education;Orthotic Fit/Training;Passive range of motion;Vestibular    PT Next Visit Plan  add to HEP- eyes closed. stair training. backwards gait and gait training/step length with no AD (work on dynamic gait tasks with no AD). Work on obstacles, around and over, continue with high level balance and strengthening. eyes closed balance on compliant surfaces (assess for dizziness).    PT Home Exercise Plan  [id number]    Consulted and Agree with Plan of Care  Patient;Other (Comment)   husband/pt through interpretor      Patient will benefit from skilled therapeutic intervention in order to improve the following deficits and impairments:  Abnormal gait, Decreased activity tolerance, Decreased balance,  Decreased cognition, Decreased endurance, Decreased knowledge of precautions, Decreased mobility, Decreased strength, Decreased safety awareness, Impaired perceived functional ability, Impaired flexibility, Postural dysfunction  Visit Diagnosis: Unsteadiness on feet  Other lack of coordination  Muscle weakness (generalized)  Other abnormalities of gait and mobility     Problem List Patient Active Problem List   Diagnosis Date Noted  . DM (diabetes mellitus), type 2 ([place]) 11/15/2018  . Embolic stroke ([place]) 96/78/9381  . Urinary tract infection without hematuria   . E coli bacteremia 11/08/2018  . Cerebral embolism with cerebral infarction 11/07/2018  . Pneumonia   . Pyelonephritis   . Endotracheal tube present   . Septic shock ([place])   . Sepsis ([place]) 11/01/2018  . Acute respiratory failure ([place]) 11/01/2018  . Endophthalmitis, acute 05/13/2017  . Healthcare maintenance 09/09/2016  . DM (diabetes mellitus) type 2, uncontrolled, with ketoacidosis ([place]) 05/17/2014  . Heart murmur 12/14/2013  . Cough 07/06/2013  . Hypertriglyceridemia 07/06/2013  . Type 2 diabetes mellitus without complication, with long-term current use of insulin ([place]) 07/06/2013  . Essential hypertension, benign 12/22/2012  . Hyperglycemia 11/17/2012  . Hypertension, uncontrolled 11/17/2012    Arliss Journey, PT, DPT  02/13/2019, 12:20 PM  [facility] [address] [place], [place], [address] Phone: [phone]   Fax:  [phone]  Name: Aimee Parker MRN: [id number] Date of Birth: 1950-12-25

## 2019-02-13 NOTE — Therapy (Signed)
[facility] [address] [place], [place], [address] Phone: [phone]   Fax:  [phone]  Occupational Therapy Treatment  Patient Details  Name: Aimee Parker MRN: [id number] Date of Birth: [birth year] Referring Provider (OT): Dr. Alysia Penna   Encounter Date: 02/13/2019  OT End of Session - 02/13/19 0928    Visit Number  11    Number of Visits  17    Date for OT Re-Evaluation  02/24/19    Authorization Type  UHC MCR primary, MCD secondary    Authorization Time Period  week 7/8    Authorization - Visit Number  11    Authorization - Number of Visits  20    OT Start Time  0847    OT Stop Time  0930    OT Time Calculation (min)  43 min    Activity Tolerance  Patient tolerated treatment well    Behavior During Therapy  [facility] for tasks assessed/performed       Past Medical History:  Diagnosis Date  . Diabetes mellitus without complication ([place])   . Hypertension   . Vitamin D deficiency     Past Surgical History:  Procedure Laterality Date  . EYE SURGERY     Left eye surgery  . PARS PLANA VITRECTOMY Right 05/13/2017   Procedure: PARS PLANA VITRECTOMY 25 GAUGE FOR ENDOPHTHALMITIS;  Surgeon: Hurman Horn, MD;  Location: [facility];  Service: Ophthalmology;  Laterality: Right;    There were no vitals filed for this visit.  Subjective Assessment - 02/13/19 0853    Subjective   No dizziness now but only when looking up or down    Patient is accompanied by:  Family member;Interpreter    Pertinent History  CVA 10/2018. Admitted for septic shock on 11/01/18 and intubated 11/02/18. MRI showed CVA on 11/06/18.    Limitations  fall risk, needs interpreter    Patient Stated Goals  get balance better    Currently in Pain?  No/denies       Pt simulated cooking task by getting out pot from lower cabinet and filling 1/2 way w/ water to bring to stove then back to sink to pour. Pt given safety tips for balance and to  prevent spills/burns via interpreter. Pt demo well w/ no LOB.  Sweeping w/ cues for fall prevention and given 2 strategies/alternative ways to pick up dirt off floor.  Practiced picking cones from ground w/o LOB. Pt performing wt shifts and stepping outside BOS (laterally, backwards, and forwards) to retrieve/replace clothespins on antenna w/o LOB but occasionally smaller step length and cues to step bigger.  Pt carrying cup of water and plate of "food" w/ cues for normal stride length with ambulation - no spills, drops, or LOB UBE x 5 min level 3 resistance for UE strengthening/endurance                      OT Short Term Goals - 02/08/19 2703      OT SHORT TERM GOAL #1   Title  Independent with HEP for BUE arm strengthening, Rt hand strengthening, and LUE gross motor coordination - extended to 01/20/2019 due to missed visits    Time  4    Period  Weeks    Status  Achieved      OT SHORT TERM GOAL #2   Title  Pt to bathe self w/ supervision and A/E prn    Baseline  min assist  Time  4    Period  Weeks    Status  Achieved      OT SHORT TERM GOAL #3   Title  Pt to improve Rt grip strength to 30 lbs for opening jars/containers    Baseline  22 lbs (Lt = 35 lbs)    Time  4    Period  Weeks    Status  Achieved   01/16/2019  27 pounds.  02/08/19:  30.6lbs     OT SHORT TERM GOAL #4   Title  Pt to stand to fold towels and wash dishes for 10 minutes w/o rest    Time  4    Period  Weeks    Status  Achieved   01/16/2019  15 minutes then rated fatigue 4/10     OT SHORT TERM GOAL #5   Title  Pt to make simple snack/sandwich w/ supervision    Time  4    Period  Weeks    Status  Achieved        OT Long Term Goals - 02/13/19 1415      OT LONG TERM GOAL #1   Title  Pt to return to simple cooking tasks at mod I level    Time  8    Period  Weeks    Status  Achieved   per pt report (however not always doing)     OT LONG TERM GOAL #2   Title  Pt to return to light  household maintence/cleaning and laundry tasks mod I level    Time  8    Period  Weeks    Status  On-going   01/25/19 - pt cooking daily, can wash dishes/sweep/do laundry on "good days" - 4/7 days per week. Husband does assist with heavy items when cooking     OT LONG TERM GOAL #3   Title  Pt to attend to Rt side and perform tabletop visual scanning without cues    Time  8    Period  Weeks    Status  Achieved      OT LONG TERM GOAL #4   Title  Pt to demo sufficient strength BUE's to put groceries away and retrieve/replace items up to 3 lbs on high shelf    Time  8    Period  Weeks    Status  Achieved   able to lift 5 pound object x3 each UE           Plan - 02/13/19 1416    Clinical Impression Statement  Pt continues to make progress towards remaining goals and balance. Pt is now at 7/8 weeks in current POC and anticipate pt can d/c from O.T. by end of next week    Occupational performance deficits (Please refer to evaluation for details):  ADL's;IADL's    Body Structure / Function / Physical Skills  ADL;IADL;Body mechanics;Balance;Mobility;Sensation;Coordination;UE functional use;Decreased knowledge of use of DME;Proprioception    Cognitive Skills  Attention;Memory;Thought;Understand    Rehab Potential  Good    OT Frequency  2x / week    OT Duration  8 weeks    OT Treatment/Interventions  Self-care/ADL training;Therapeutic exercise;Functional Mobility Training;Neuromuscular education;Manual Therapy;Therapeutic activities;DME and/or AE instruction;Cognitive remediation/compensation;Visual/perceptual remediation/compensation;Moist Heat;Passive range of motion;Patient/family education    Plan  continue to work towards remaining goals/IADLs       Patient will benefit from skilled therapeutic intervention in order to improve the following deficits and impairments:   Body Structure / Function / Physical  Skills: ADL, IADL, Body mechanics, Balance, Mobility, Sensation, Coordination,  UE functional use, Decreased knowledge of use of DME, Proprioception Cognitive Skills: Attention, Memory, Thought, Understand     Visit Diagnosis: Unsteadiness on feet  Other lack of coordination  Muscle weakness (generalized)    Problem List Patient Active Problem List   Diagnosis Date Noted  . DM (diabetes mellitus), type 2 ([facility]) 11/15/2018  . Embolic stroke ([facility]) 11/15/2018  . Urinary tract infection without hematuria   . E coli bacteremia 11/08/2018  . Cerebral embolism with cerebral infarction 11/07/2018  . Pneumonia   . Pyelonephritis   . Endotracheal tube present   . Septic shock ([facility])   . Sepsis ([facility]) 11/01/2018  . Acute respiratory failure ([facility]) 11/01/2018  . Endophthalmitis, acute 05/13/2017  . Healthcare maintenance 09/09/2016  . DM (diabetes mellitus) type 2, uncontrolled, with ketoacidosis ([facility]) 05/17/2014  . Heart murmur 12/14/2013  . Cough 07/06/2013  . Hypertriglyceridemia 07/06/2013  . Type 2 diabetes mellitus without complication, with long-term current use of insulin ([facility]) 07/06/2013  . Essential hypertension, benign 12/22/2012  . Hyperglycemia 11/17/2012  . Hypertension, uncontrolled 11/17/2012    Kelli Churn, OTR/L 02/13/2019, 2:18 PM  [facility] [address] [place], [place], [address] Phone: [phone]   Fax:  [phone]  Name: Aimee Parker MRN: [id number] Date of Birth: May 29, 1950

## 2019-02-15 ENCOUNTER — Ambulatory Visit: Payer: Medicare Other | Admitting: Physical Therapy

## 2019-02-15 ENCOUNTER — Other Ambulatory Visit: Payer: Self-pay

## 2019-02-15 ENCOUNTER — Ambulatory Visit: Payer: Medicare Other | Admitting: Occupational Therapy

## 2019-02-15 DIAGNOSIS — R2681 Unsteadiness on feet: Secondary | ICD-10-CM

## 2019-02-15 DIAGNOSIS — M6281 Muscle weakness (generalized): Secondary | ICD-10-CM

## 2019-02-15 DIAGNOSIS — R2689 Other abnormalities of gait and mobility: Secondary | ICD-10-CM

## 2019-02-15 DIAGNOSIS — R278 Other lack of coordination: Secondary | ICD-10-CM

## 2019-02-15 NOTE — Therapy (Signed)
[facility] [address] [place], [place], [address] Phone: [phone]   Fax:  [phone]  Physical Therapy Treatment  Patient Details  Name: Aimee Parker MRN: [id number] Date of Birth: November 13, 1950 Referring Provider (PT): Alysia Penna, MD   Encounter Date: 02/15/2019  PT End of Session - 02/15/19 0953    Visit Number  14    Number of Visits  20    Date for PT Re-Evaluation  04/09/19   written for 4 week POC   Authorization Type  UHC Medicare/medicaid (10th visit progress note needed)    PT Start Time  0856   able to start pt early due to PT having a cancellation   PT Stop Time  0938    PT Time Calculation (min)  42 min    Equipment Utilized During Treatment  Gait belt   vietnamese interpreter service   Activity Tolerance  Patient tolerated treatment well    Behavior During Therapy  [facility] for tasks assessed/performed       Past Medical History:  Diagnosis Date  . Diabetes mellitus without complication ([place])   . Hypertension   . Vitamin D deficiency     Past Surgical History:  Procedure Laterality Date  . EYE SURGERY     Left eye surgery  . PARS PLANA VITRECTOMY Right 05/13/2017   Procedure: PARS PLANA VITRECTOMY 25 GAUGE FOR ENDOPHTHALMITIS;  Surgeon: Hurman Horn, MD;  Location: [facility];  Service: Ophthalmology;  Laterality: Right;    There were no vitals filed for this visit.  Subjective Assessment - 02/15/19 0857    Subjective  Feeling tired from OT today. States that the exercises are going very good.    Patient is accompained by:  Family member;Interpreter   [name] (pronounced)   Pertinent History  HTN, DMII    Limitations  Walking;House hold activities    Patient Stated Goals  "I want to be like I was before."    Currently in Pain?  No/denies                       [facility] Adult PT Treatment/Exercise - 02/15/19 0001      Transfers   Transfers  Sit to Stand    Sit to Stand   5: Supervision;Without upper extremity assist    Stand to Sit  5: Supervision;Without upper extremity assist    Comments  1 x 10 reps standing on red beam: cues for forward weight shift and eccentric control when sitting, needed min guard for 1st rep due to pt feeling off balance      High Level Balance   High Level Balance Comments  ambulated 130' with ball toss for dynamic balance, pt needing initial cues for longer step length B, with good carryover. pt with no LOB and no reports of dizziness          Balance Exercises - 02/15/19 0957      Balance Exercises: Standing   Standing Eyes Closed  Narrow base of support (BOS);Foam/compliant surface;5 reps;30 secs;Other (comment)   25-30 seconds each   Rockerboard  Limitations;Anterior/posterior;EO;10 reps   cues for hip strategy, no UE support    Rockerboard Limitations  standing on rocker board; with reaching with RUE and LUE across body grabbing bean bags from therapist and tossing into crate, intermittent UE support and min guard for balance, pt able to better utilize hip strategy to maintain balance     Other Standing Exercises  Standing in corner on foam: with feet less than hip width distance - eyes closed 3x5 reps head turns, eyes closed 3 x5 reps head nods. Pt only reporting 2/10 dizziness after initial rep of eyes closed with head turns - subsided after stopping activity. Pt felt fatigued after corner balance and needed to sit and rest.         PT Education - 02/15/19 0953    Education Details  romberg stance eyes closed in corner on pillow addition to HEP    Person(s) Educated  Patient    Methods  Explanation;Demonstration;Handout    Comprehension  Returned demonstration;Verbalized understanding       PT Short Term Goals - 01/16/19 1026      PT SHORT TERM GOAL #1   Title  Pt will be ind with initial HEP in order to improve balance and functional mobility.  (Target Date: 01/11/19)    Baseline  pt reports she is independent  with HEP    Time  4    Period  Weeks    Status  Achieved    Target Date  01/11/19      PT SHORT TERM GOAL #2   Title  Pt will improve BERG balance score to 46/56 in order to indicate decreased fall risk.    Baseline  47/56    Status  Achieved      PT SHORT TERM GOAL #3   Title  Pt will improve gait speed to >/=2.62 ft/sec with LRAD in order to indicate safe community ambulation.    Baseline  16.22 secs = 2.02  ft/sec, 16.34 sec = 2.01 ft/sec with SPC    Status  Not Met      PT SHORT TERM GOAL #4   Title  Pt will perform 5TSS in </=11 secs without UE support and no reliance of LEs on chair for support to indicate improved functional strength.    Baseline  11.59 seconds without UE support.    Status  Partially Met      PT SHORT TERM GOAL #5   Title  Pt will ambulate x 100' at mod I level in home without AD in order to indicate improved independence in home.    Baseline  x100' with supervision with no AD    Status  Partially Met      PT SHORT TERM GOAL #6   Title  Pt will ambulate x 500' w/ LRAD at S level over unlevel paved surfaces in order to indicate safe community ambulation.    Baseline  able to ambulate 300' with [facility] outsdoors with supervision (unable to perform full 500' due to weather)    Status  Partially Met        PT Long Term Goals - 02/08/19 1158      PT LONG TERM GOAL #1   Title  Pt will be ind with final HEP in order to indicate decreased fall risk and improved functional mobility (ALL LTGS DUE 03/08/19)    Baseline  pt will continue to benefit from ongoing revisions to HEP for strength and balance    Time  4    Period  Weeks    Status  On-going    Target Date  03/08/19      PT LONG TERM GOAL #2   Title  Pt will improve BERG balance score to >/=52/56 in order to indicate decreased fall risk.    Baseline  50/56 on 02/08/19    Time  4  Period  Weeks    Status  Revised      PT LONG TERM GOAL #3   Title  Pt will ambulate over varying outdoor surfaces x  1000' without AD at S level in order to indicate more independent community negotiation.    Time  4    Period  Weeks    Status  On-going      PT LONG TERM GOAL #4   Title  Patient will improve FGA score to at least a 21/30 in order to demo decr fall risk.    Baseline  17/30 on 02/08/19    Status  Revised            Plan - 02/15/19 0954    Clinical Impression Statement  Focus of today's skilled session included progressing corner balance exercises on the foam, pt able to perform on foam with eyes closed with feet closer than hip width distance 5 reps head nods and 5 reps head turns. only minimal dizziness after 1st rep, and then subsided. Updated pt's corner balance exercise on HEP. Pt tolerated dynamic balance activities well on rockerboard and during gait. Will continue to progress towards LTGs.    Personal Factors and Comorbidities  Comorbidity 2;Comorbidity 3+    Comorbidities  HTN, DMII, CVA    Examination-Activity Limitations  Caring for Others;Stairs;Locomotion Level;Carry    Examination-Participation Restrictions  Community Activity;Medication Management;Laundry    Stability/Clinical Decision Making  Evolving/Moderate complexity    Rehab Potential  Excellent    PT Frequency  2x / week    PT Duration  4 weeks    PT Treatment/Interventions  ADLs/Self Care Home Management;Aquatic Therapy;DME Instruction;Gait training;Stair training;Functional mobility training;Therapeutic activities;Therapeutic exercise;Balance training;Neuromuscular re-education;Cognitive remediation;Patient/family education;Orthotic Fit/Training;Passive range of motion;Vestibular    PT Next Visit Plan  rockerboard dynamic balance, stair training. backwards gait and gait training/step length with no AD (work on dynamic gait tasks with no AD). Work on obstacles, around and over, continue with high level balance and strengthening. eyes closed balance on compliant surfaces (assess for dizziness).    PT Home Exercise  Plan  [email or web address]    Consulted and Agree with Plan of Care  Patient;Other (Comment)   husband/pt through interpretor      Patient will benefit from skilled therapeutic intervention in order to improve the following deficits and impairments:  Abnormal gait, Decreased activity tolerance, Decreased balance, Decreased cognition, Decreased endurance, Decreased knowledge of precautions, Decreased mobility, Decreased strength, Decreased safety awareness, Impaired perceived functional ability, Impaired flexibility, Postural dysfunction  Visit Diagnosis: Unsteadiness on feet  Other lack of coordination  Muscle weakness (generalized)  Other abnormalities of gait and mobility     Problem List Patient Active Problem List   Diagnosis Date Noted  . DM (diabetes mellitus), type 2 ([place]) 11/15/2018  . Embolic stroke ([place]) 68/12/7515  . Urinary tract infection without hematuria   . E coli bacteremia 11/08/2018  . Cerebral embolism with cerebral infarction 11/07/2018  . Pneumonia   . Pyelonephritis   . Endotracheal tube present   . Septic shock ([place])   . Sepsis ([place]) 11/01/2018  . Acute respiratory failure ([place]) 11/01/2018  . Endophthalmitis, acute 05/13/2017  . Healthcare maintenance 09/09/2016  . DM (diabetes mellitus) type 2, uncontrolled, with ketoacidosis ([place]) 05/17/2014  . Heart murmur 12/14/2013  . Cough 07/06/2013  . Hypertriglyceridemia 07/06/2013  . Type 2 diabetes mellitus without complication, with long-term current use of insulin ([place]) 07/06/2013  . Essential hypertension, benign 12/22/2012  . Hyperglycemia 11/17/2012  .  Hypertension, uncontrolled 11/17/2012    Arliss Journey, PT, DPT  02/15/2019, 10:01 AM   [facility] [address] [place], [place], [address] Phone: [phone]   Fax:  [phone]  Name: Norelle Runnion MRN: [id number] Date of Birth: Mar 14, 1950

## 2019-02-15 NOTE — Patient Instructions (Signed)
Access Code: [email or web address]  URL: [email or web address]  Date: 02/15/2019  Prepared by: Sherlie Ban   Exercises Walking March - 3 reps - 1 sets - 2x daily - 7x weekly Side Stepping with Counter Support - 3 reps - 1 sets - 2x daily - 7x weekly Tandem Walking with Counter Support - 3 reps - 1 sets - 2x daily - 7x weekly Backward Walking with Counter Support - 3 reps - 1 sets - 2x daily - 7x weekly Toe Walking with Counter Support - 3 reps - 1 sets - 2x daily - 7x weekly Sit to Stand - 10 reps - 2 sets - 2x daily - 7x weekly Walking with Head Nod - 3 sets - 2x daily - 7x weekly Walking with Head Rotation - 3 sets - 2x daily - 7x weekly Supine Bridge - 10 reps - 2 sets - 2x daily - 5x weekly Clamshell with Resistance - 10 reps - 2 sets - 1x daily - 5x weekly Romberg Stance Eyes Closed on Foam Pad - 3 sets - 30 hold - 2x daily - 5x weekly

## 2019-02-15 NOTE — Therapy (Signed)
[facility] [address] [place], [place], [address] Phone: [phone]   Fax:  [phone]  Occupational Therapy Treatment  Patient Details  Name: Aimee Parker MRN: [id number] Date of Birth: [birth year] Referring Provider (OT): Dr. Alysia Penna   Encounter Date: 02/15/2019  OT End of Session - 02/15/19 0855    Visit Number  12    Number of Visits  17    Date for OT Re-Evaluation  02/24/19    Authorization Type  UHC MCR primary, MCD secondary    Authorization Time Period  week 7/8    Authorization - Visit Number  12    Authorization - Number of Visits  20    OT Start Time  0800    OT Stop Time  0845    OT Time Calculation (min)  45 min    Activity Tolerance  Patient tolerated treatment well    Behavior During Therapy  [facility] for tasks assessed/performed       Past Medical History:  Diagnosis Date  . Diabetes mellitus without complication ([place])   . Hypertension   . Vitamin D deficiency     Past Surgical History:  Procedure Laterality Date  . EYE SURGERY     Left eye surgery  . PARS PLANA VITRECTOMY Right 05/13/2017   Procedure: PARS PLANA VITRECTOMY 25 GAUGE FOR ENDOPHTHALMITIS;  Surgeon: Hurman Horn, MD;  Location: [facility];  Service: Ophthalmology;  Laterality: Right;    There were no vitals filed for this visit.  Subjective Assessment - 02/15/19 0805    Patient is accompanied by:  Family member;Interpreter    Pertinent History  CVA 10/2018. Admitted for septic shock on 11/01/18 and intubated 11/02/18. MRI showed CVA on 11/06/18.    Limitations  fall risk, needs interpreter    Patient Stated Goals  get balance better    Currently in Pain?  No/denies       Simulated making bed by placing sheet on mat for dynamic standing and endurance. Pt able to perform w/o LOB. Pt then refolded sheet.  Practiced functional ambulation w/ only initial cueing for bigger steps while carrying cup of water and plate of  "food" (then switching hands) x 150+ ft w/o LOB or spills/drops.  Pt practiced retrieving items from lower shelves w/ 1 hand countertop support, then from high shelves w/ no hand support. Pt retrieving/replacing 3 lb weight x 5 reps on high shelf RUE, then LUE. Pt reports fatigue w/ RUE Rt grip strength today was slightly lower than when last assessed (27 lbs) and encouraged pt to continue to use red putty at home to increase grip strength. Gripper set at level 2 resistance to pick up blocks Rt hand for sustained grip. Followed by strengthening RUE to place red and green clothespins on antenna and removing w/ 2 lb wrist weight on arm.                       OT Short Term Goals - 02/08/19 3790      OT SHORT TERM GOAL #1   Title  Independent with HEP for BUE arm strengthening, Rt hand strengthening, and LUE gross motor coordination - extended to 01/20/2019 due to missed visits    Time  4    Period  Weeks    Status  Achieved      OT SHORT TERM GOAL #2   Title  Pt to bathe self w/ supervision and A/E prn  Baseline  min assist    Time  4    Period  Weeks    Status  Achieved      OT SHORT TERM GOAL #3   Title  Pt to improve Rt grip strength to 30 lbs for opening jars/containers    Baseline  22 lbs (Lt = 35 lbs)    Time  4    Period  Weeks    Status  Achieved   01/16/2019  27 pounds.  02/08/19:  30.6lbs     OT SHORT TERM GOAL #4   Title  Pt to stand to fold towels and wash dishes for 10 minutes w/o rest    Time  4    Period  Weeks    Status  Achieved   01/16/2019  15 minutes then rated fatigue 4/10     OT SHORT TERM GOAL #5   Title  Pt to make simple snack/sandwich w/ supervision    Time  4    Period  Weeks    Status  Achieved        OT Long Term Goals - 02/13/19 1415      OT LONG TERM GOAL #1   Title  Pt to return to simple cooking tasks at mod I level    Time  8    Period  Weeks    Status  Achieved   per pt report (however not always doing)     OT LONG TERM  GOAL #2   Title  Pt to return to light household maintence/cleaning and laundry tasks mod I level    Time  8    Period  Weeks    Status  On-going   01/25/19 - pt cooking daily, can wash dishes/sweep/do laundry on "good days" - 4/7 days per week. Husband does assist with heavy items when cooking     OT LONG TERM GOAL #3   Title  Pt to attend to Rt side and perform tabletop visual scanning without cues    Time  8    Period  Weeks    Status  Achieved      OT LONG TERM GOAL #4   Title  Pt to demo sufficient strength BUE's to put groceries away and retrieve/replace items up to 3 lbs on high shelf    Time  8    Period  Weeks    Status  Achieved   able to lift 5 pound object x3 each UE           Plan - 02/15/19 0855    Clinical Impression Statement  Pt progressing with functional balance and dynamic standing for IADLS. Pt still fatigues RUE w/ sustained lifting.    Occupational performance deficits (Please refer to evaluation for details):  ADL's;IADL's    Body Structure / Function / Physical Skills  ADL;IADL;Body mechanics;Balance;Mobility;Sensation;Coordination;UE functional use;Decreased knowledge of use of DME;Proprioception    Cognitive Skills  Attention;Memory;Thought;Understand    Rehab Potential  Good    OT Frequency  2x / week    OT Duration  8 weeks    OT Treatment/Interventions  Self-care/ADL training;Therapeutic exercise;Functional Mobility Training;Neuromuscular education;Manual Therapy;Therapeutic activities;DME and/or AE instruction;Cognitive remediation/compensation;Visual/perceptual remediation/compensation;Moist Heat;Passive range of motion;Patient/family education    Plan  continue dynamic standing/stepping outside BOS, continue RUE strengthening w/ functional reach and Rt grip strength, anticipate d/c end of next week    Consulted and Agree with Plan of Care  Patient;Family member/caregiver    Family Member Consulted  husband  Patient will benefit from  skilled therapeutic intervention in order to improve the following deficits and impairments:   Body Structure / Function / Physical Skills: ADL, IADL, Body mechanics, Balance, Mobility, Sensation, Coordination, UE functional use, Decreased knowledge of use of DME, Proprioception Cognitive Skills: Attention, Memory, Thought, Understand     Visit Diagnosis: Unsteadiness on feet  Muscle weakness (generalized)    Problem List Patient Active Problem List   Diagnosis Date Noted  . DM (diabetes mellitus), type 2 ([facility]) 11/15/2018  . Embolic stroke ([facility]) 11/15/2018  . Urinary tract infection without hematuria   . E coli bacteremia 11/08/2018  . Cerebral embolism with cerebral infarction 11/07/2018  . Pneumonia   . Pyelonephritis   . Endotracheal tube present   . Septic shock ([facility])   . Sepsis ([facility]) 11/01/2018  . Acute respiratory failure ([facility]) 11/01/2018  . Endophthalmitis, acute 05/13/2017  . Healthcare maintenance 09/09/2016  . DM (diabetes mellitus) type 2, uncontrolled, with ketoacidosis ([facility]) 05/17/2014  . Heart murmur 12/14/2013  . Cough 07/06/2013  . Hypertriglyceridemia 07/06/2013  . Type 2 diabetes mellitus without complication, with long-term current use of insulin ([facility]) 07/06/2013  . Essential hypertension, benign 12/22/2012  . Hyperglycemia 11/17/2012  . Hypertension, uncontrolled 11/17/2012    Kelli Churn, OTR/L 02/15/2019, 8:57 AM  [facility] [address] [place], [place], [address] Phone: [phone]   Fax:  [phone]  Name: Ladasha Schnackenberg MRN: [id number] Date of Birth: 06/30/1950

## 2019-02-20 ENCOUNTER — Other Ambulatory Visit: Payer: Self-pay

## 2019-02-20 ENCOUNTER — Ambulatory Visit: Payer: Medicare Other | Admitting: Occupational Therapy

## 2019-02-20 ENCOUNTER — Ambulatory Visit: Payer: Medicare Other | Admitting: Physical Therapy

## 2019-02-20 ENCOUNTER — Encounter: Payer: Self-pay | Admitting: Physical Therapy

## 2019-02-20 DIAGNOSIS — M6281 Muscle weakness (generalized): Secondary | ICD-10-CM

## 2019-02-20 DIAGNOSIS — R2681 Unsteadiness on feet: Secondary | ICD-10-CM

## 2019-02-20 DIAGNOSIS — R2689 Other abnormalities of gait and mobility: Secondary | ICD-10-CM

## 2019-02-20 DIAGNOSIS — R278 Other lack of coordination: Secondary | ICD-10-CM

## 2019-02-20 NOTE — Therapy (Signed)
[facility] [address] [place], [place], [address] Phone: [phone]   Fax:  [phone]  Physical Therapy Treatment  Patient Details  Name: Aimee Parker MRN: [id number] Date of Birth: 1950/11/27 Referring Provider (PT): Alysia Penna, MD   Encounter Date: 02/20/2019  PT End of Session - 02/20/19 1327    Visit Number  15    Number of Visits  20    Date for PT Re-Evaluation  04/09/19   written for 4 week POC   Authorization Type  UHC Medicare/medicaid (10th visit progress note needed)    PT Start Time  0933    PT Stop Time  1014    PT Time Calculation (min)  41 min    Equipment Utilized During Treatment  Gait belt   vietnamese interpreter service   Activity Tolerance  Patient tolerated treatment well    Behavior During Therapy  [facility] for tasks assessed/performed       Past Medical History:  Diagnosis Date  . Diabetes mellitus without complication ([place])   . Hypertension   . Vitamin D deficiency     Past Surgical History:  Procedure Laterality Date  . EYE SURGERY     Left eye surgery  . PARS PLANA VITRECTOMY Right 05/13/2017   Procedure: PARS PLANA VITRECTOMY 25 GAUGE FOR ENDOPHTHALMITIS;  Surgeon: Hurman Horn, MD;  Location: [facility];  Service: Ophthalmology;  Laterality: Right;    There were no vitals filed for this visit.  Subjective Assessment - 02/20/19 0936    Subjective  Tried the corner exercises over the weekend and states that they went well. notices her walking is easier when she is not walking as fast. didn't bring the cane in today.    Patient is accompained by:  Family member;Interpreter   [name] (pronounced)   Pertinent History  HTN, DMII    Limitations  Walking;House hold activities    Patient Stated Goals  "I want to be like I was before."    Currently in Pain?  No/denies                       [facility] Adult PT Treatment/Exercise - 02/20/19 0001      Ambulation/Gait    Ambulation/Gait  Yes    Ambulation/Gait Assistance  5: Supervision;4: Min guard    Ambulation/Gait Assistance Details  2 laps asking pt to increase gait speed with reciprocal arm swing, pt able to maintain speed x3 laps with no LOB. With 2 black foam beams, long wooden hurdle, red beam, and blue mat for compliant surface - multiple reps performing obstacles/compliant surfaces, with pt performing more slow shuffled steps when reaching compliant surface and hurdles. Pt needing cues for larger step length and for incr hip/knee flexion for LE clearance, min guard for balance. Took a couple obstacles away and blue mat, when presented with 3 hurdles, pt able to demo improve step length and better maintaining gait speed before stepping over obstacle.     Ambulation Distance (Feet)  700 Feet    Assistive device  None    Gait Pattern  Step-through pattern    Ambulation Surface  Level;Indoor      Exercises   Exercises  Other Exercises    Other Exercises   Side steps down and back 2 x 25' with green band around distal thighs for hip ABD strengthening, visual and initial verbal cues for technique, first needed BUE support then progressing to no UE support  and min guard for balance. Felt increased fatigue after 1st rep.          Balance Exercises - 02/20/19 1013      Balance Exercises: Standing   Standing Eyes Closed  Narrow base of support (BOS);Foam/compliant surface;3 reps;30 secs;Other (comment);Limitations   feet together   Standing Eyes Closed Limitations  feet slightly apart on foam: eyes closed 2 x 5 reps head nods, 2 x 5 reps head turns - no dizziness afterwards          PT Short Term Goals - 01/16/19 1026      PT SHORT TERM GOAL #1   Title  Pt will be ind with initial HEP in order to improve balance and functional mobility.  (Target Date: 01/11/19)    Baseline  pt reports she is independent with HEP    Time  4    Period  Weeks    Status  Achieved    Target Date  01/11/19       PT SHORT TERM GOAL #2   Title  Pt will improve BERG balance score to 46/56 in order to indicate decreased fall risk.    Baseline  47/56    Status  Achieved      PT SHORT TERM GOAL #3   Title  Pt will improve gait speed to >/=2.62 ft/sec with LRAD in order to indicate safe community ambulation.    Baseline  16.22 secs = 2.02  ft/sec, 16.34 sec = 2.01 ft/sec with SPC    Status  Not Met      PT SHORT TERM GOAL #4   Title  Pt will perform 5TSS in </=11 secs without UE support and no reliance of LEs on chair for support to indicate improved functional strength.    Baseline  11.59 seconds without UE support.    Status  Partially Met      PT SHORT TERM GOAL #5   Title  Pt will ambulate x 100' at mod I level in home without AD in order to indicate improved independence in home.    Baseline  x100' with supervision with no AD    Status  Partially Met      PT SHORT TERM GOAL #6   Title  Pt will ambulate x 500' w/ LRAD at S level over unlevel paved surfaces in order to indicate safe community ambulation.    Baseline  able to ambulate 300' with [facility] 2 outsdoors with supervision (unable to perform full 500' due to weather)    Status  Partially Met        PT Long Term Goals - 02/08/19 1158      PT LONG TERM GOAL #1   Title  Pt will be ind with final HEP in order to indicate decreased fall risk and improved functional mobility (ALL LTGS DUE 03/08/19)    Baseline  pt will continue to benefit from ongoing revisions to HEP for strength and balance    Time  4    Period  Weeks    Status  On-going    Target Date  03/08/19      PT LONG TERM GOAL #2   Title  Pt will improve BERG balance score to >/=52/56 in order to indicate decreased fall risk.    Baseline  50/56 on 02/08/19    Time  4    Period  Weeks    Status  Revised      PT LONG TERM GOAL #3   Title  Pt will ambulate over varying outdoor surfaces x 1000' without AD at S level in order to indicate more independent community negotiation.     Time  4    Period  Weeks    Status  On-going      PT LONG TERM GOAL #4   Title  Patient will improve FGA score to at least a 21/30 in order to demo decr fall risk.    Baseline  17/30 on 02/08/19    Status  Revised            Plan - 02/20/19 1329    Clinical Impression Statement  Focus of today's skilled session was dynamic gait training, balance strategies on foam, and LE strengthening. Pt challenged by obstacles and stepping over compliant surfaces - demonstrated decr gait speed and shorter shuffled steps when attempting to clear multiple obstacles. Pt improving vestibular system for balance - able to perform feet together eyes closed on foam with head motions today with no dizziness. Was fatigued after side stepping activity with green theraband today. Will continue to progress towards LTGs.    Personal Factors and Comorbidities  Comorbidity 2;Comorbidity 3+    Comorbidities  HTN, DMII, CVA    Examination-Activity Limitations  Caring for Others;Stairs;Locomotion Level;Carry    Examination-Participation Restrictions  Community Activity;Medication Management;Laundry    Stability/Clinical Decision Making  Evolving/Moderate complexity    Rehab Potential  Excellent    PT Frequency  2x / week    PT Duration  4 weeks    PT Treatment/Interventions  ADLs/Self Care Home Management;Aquatic Therapy;DME Instruction;Gait training;Stair training;Functional mobility training;Therapeutic activities;Therapeutic exercise;Balance training;Neuromuscular re-education;Cognitive remediation;Patient/family education;Orthotic Fit/Training;Passive range of motion;Vestibular    PT Next Visit Plan  step ups/side stepping w/ t band. rockerboard dynamic balance, stair training. backwards gait, work on dynamic gait tasks with no AD. Work on obstacles, around and over, continue with high level balance and strengthening. eyes closed balance on compliant surfaces (assess for dizziness).    PT Home Exercise Plan  [id number]     Consulted and Agree with Plan of Care  Patient;Other (Comment)   husband/pt through interpretor      Patient will benefit from skilled therapeutic intervention in order to improve the following deficits and impairments:  Abnormal gait, Decreased activity tolerance, Decreased balance, Decreased cognition, Decreased endurance, Decreased knowledge of precautions, Decreased mobility, Decreased strength, Decreased safety awareness, Impaired perceived functional ability, Impaired flexibility, Postural dysfunction  Visit Diagnosis: Unsteadiness on feet  Muscle weakness (generalized)  Other lack of coordination  Other abnormalities of gait and mobility     Problem List Patient Active Problem List   Diagnosis Date Noted  . DM (diabetes mellitus), type 2 ([place]) 11/15/2018  . Embolic stroke ([place]) 74/82/7078  . Urinary tract infection without hematuria   . E coli bacteremia 11/08/2018  . Cerebral embolism with cerebral infarction 11/07/2018  . Pneumonia   . Pyelonephritis   . Endotracheal tube present   . Septic shock ([place])   . Sepsis ([place]) 11/01/2018  . Acute respiratory failure ([place]) 11/01/2018  . Endophthalmitis, acute 05/13/2017  . Healthcare maintenance 09/09/2016  . DM (diabetes mellitus) type 2, uncontrolled, with ketoacidosis ([place]) 05/17/2014  . Heart murmur 12/14/2013  . Cough 07/06/2013  . Hypertriglyceridemia 07/06/2013  . Type 2 diabetes mellitus without complication, with long-term current use of insulin ([place]) 07/06/2013  . Essential hypertension, benign 12/22/2012  . Hyperglycemia 11/17/2012  . Hypertension, uncontrolled 11/17/2012    Arliss Journey, PT, DPT  02/20/2019, 1:31 PM  [facility] [address] [place], [place], [address] Phone: [phone]   Fax:  [phone]  Name: Ofilia Rayon MRN: [id number] Date of Birth: [birth year]

## 2019-02-20 NOTE — Therapy (Signed)
[facility] [address] [place], [place], [address] Phone: [phone]   Fax:  [phone]  Occupational Therapy Treatment  Patient Details  Name: Aimee Parker MRN: [id number] Date of Birth: Oct 13, 1950 Referring Provider (OT): Dr. Claudette Laws   Encounter Date: 02/20/2019  OT End of Session - 02/20/19 1042    Visit Number  13    Number of Visits  17    Date for OT Re-Evaluation  02/24/19    Authorization Type  UHC MCR primary, MCD secondary    Authorization Time Period  week 8/8    Authorization - Visit Number  13    Authorization - Number of Visits  20    OT Start Time  1018    OT Stop Time  1100    OT Time Calculation (min)  42 min    Activity Tolerance  Patient tolerated treatment well    Behavior During Therapy  [facility] for tasks assessed/performed       Past Medical History:  Diagnosis Date  . Diabetes mellitus without complication ([facility])   . Hypertension   . Vitamin D deficiency     Past Surgical History:  Procedure Laterality Date  . EYE SURGERY     Left eye surgery  . PARS PLANA VITRECTOMY Right 05/13/2017   Procedure: PARS PLANA VITRECTOMY 25 GAUGE FOR ENDOPHTHALMITIS;  Surgeon: Edmon Crape, MD;  Location: [facility];  Service: Ophthalmology;  Laterality: Right;    There were no vitals filed for this visit.  Subjective Assessment - 02/20/19 1018    Pertinent History  CVA 10/2018. Admitted for septic shock on 11/01/18 and intubated 11/02/18. MRI showed CVA on 11/06/18.    Limitations  fall risk, needs interpreter    Patient Stated Goals  get balance better    Currently in Pain?  No/denies      Practiced carrying pot 1/2 full of water w/ Rt hand (Lt hand as support prn) from sink to stove multiple times, then w/ 1/4 amount of water Rt hand only. Pt practiced picking up items off floor w/o hand support and close supervision. Pt carrying 1/2 gallon of milk w/ Rt hand 75+ feet, then 4 lb weight 75+  feet Rt hand.  Pt placing graded clothespins on antenna w/ 2 lb wrist weight on RUE for strengthening. Pt placing blocks in bowl w/ gripper set at level 2 for sustained grip strength Rt hand. Then increased resistance to level 3 to place 1/2 amount of blocks in bowl.  Pt reports dizziness is getting better when looking up/down                       OT Short Term Goals - 02/08/19 0942      OT SHORT TERM GOAL #1   Title  Independent with HEP for BUE arm strengthening, Rt hand strengthening, and LUE gross motor coordination - extended to 01/20/2019 due to missed visits    Time  4    Period  Weeks    Status  Achieved      OT SHORT TERM GOAL #2   Title  Pt to bathe self w/ supervision and A/E prn    Baseline  min assist    Time  4    Period  Weeks    Status  Achieved      OT SHORT TERM GOAL #3   Title  Pt to improve Rt grip strength to 30 lbs for opening  jars/containers    Baseline  22 lbs (Lt = 35 lbs)    Time  4    Period  Weeks    Status  Achieved   01/16/2019  27 pounds.  02/08/19:  30.6lbs     OT SHORT TERM GOAL #4   Title  Pt to stand to fold towels and wash dishes for 10 minutes w/o rest    Time  4    Period  Weeks    Status  Achieved   01/16/2019  15 minutes then rated fatigue 4/10     OT SHORT TERM GOAL #5   Title  Pt to make simple snack/sandwich w/ supervision    Time  4    Period  Weeks    Status  Achieved        OT Long Term Goals - 02/13/19 1415      OT LONG TERM GOAL #1   Title  Pt to return to simple cooking tasks at mod I level    Time  8    Period  Weeks    Status  Achieved   per pt report (however not always doing)     OT LONG TERM GOAL #2   Title  Pt to return to light household maintence/cleaning and laundry tasks mod I level    Time  8    Period  Weeks    Status  On-going   01/25/19 - pt cooking daily, can wash dishes/sweep/do laundry on "good days" - 4/7 days per week. Husband does assist with heavy items when cooking     OT  LONG TERM GOAL #3   Title  Pt to attend to Rt side and perform tabletop visual scanning without cues    Time  8    Period  Weeks    Status  Achieved      OT LONG TERM GOAL #4   Title  Pt to demo sufficient strength BUE's to put groceries away and retrieve/replace items up to 3 lbs on high shelf    Time  8    Period  Weeks    Status  Achieved   able to lift 5 pound object x3 each UE           Plan - 02/20/19 1043    Clinical Impression Statement  Pt progressing with RUE strength, dynamic standing, and functional ambulation.    Occupational performance deficits (Please refer to evaluation for details):  ADL's;IADL's    Body Structure / Function / Physical Skills  ADL;IADL;Body mechanics;Balance;Mobility;Sensation;Coordination;UE functional use;Decreased knowledge of use of DME;Proprioception    Cognitive Skills  Attention;Memory;Thought;Understand    Rehab Potential  Good    OT Frequency  2x / week    OT Duration  8 weeks    OT Treatment/Interventions  Self-care/ADL training;Therapeutic exercise;Functional Mobility Training;Neuromuscular education;Manual Therapy;Therapeutic activities;DME and/or AE instruction;Cognitive remediation/compensation;Visual/perceptual remediation/compensation;Moist Heat;Passive range of motion;Patient/family education    Plan  reassess grip strength Rt hand, check remaining goals and d/c next session    Consulted and Agree with Plan of Care  Patient;Family member/caregiver    Family Member Consulted  husband       Patient will benefit from skilled therapeutic intervention in order to improve the following deficits and impairments:   Body Structure / Function / Physical Skills: ADL, IADL, Body mechanics, Balance, Mobility, Sensation, Coordination, UE functional use, Decreased knowledge of use of DME, Proprioception Cognitive Skills: Attention, Memory, Thought, Understand     Visit Diagnosis: Unsteadiness on feet  Muscle weakness  (generalized)    Problem List Patient Active Problem List   Diagnosis Date Noted  . DM (diabetes mellitus), type 2 ([place]) 11/15/2018  . Embolic stroke ([place]) 94/07/6806  . Urinary tract infection without hematuria   . E coli bacteremia 11/08/2018  . Cerebral embolism with cerebral infarction 11/07/2018  . Pneumonia   . Pyelonephritis   . Endotracheal tube present   . Septic shock ([place])   . Sepsis ([place]) 11/01/2018  . Acute respiratory failure ([place]) 11/01/2018  . Endophthalmitis, acute 05/13/2017  . Healthcare maintenance 09/09/2016  . DM (diabetes mellitus) type 2, uncontrolled, with ketoacidosis ([place]) 05/17/2014  . Heart murmur 12/14/2013  . Cough 07/06/2013  . Hypertriglyceridemia 07/06/2013  . Type 2 diabetes mellitus without complication, with long-term current use of insulin ([place]) 07/06/2013  . Essential hypertension, benign 12/22/2012  . Hyperglycemia 11/17/2012  . Hypertension, uncontrolled 11/17/2012    Carey Bullocks, OTR/L 02/20/2019, 10:47 AM  [facility] [address] [place], [place], [address] Phone: [phone]   Fax:  [phone]  Name: Aimee Parker MRN: [id number] Date of Birth: 02/07/1950

## 2019-02-22 ENCOUNTER — Other Ambulatory Visit: Payer: Self-pay

## 2019-02-22 ENCOUNTER — Ambulatory Visit: Payer: Medicare Other | Admitting: Physical Therapy

## 2019-02-22 ENCOUNTER — Ambulatory Visit: Payer: Medicare Other | Admitting: Occupational Therapy

## 2019-02-22 DIAGNOSIS — M6281 Muscle weakness (generalized): Secondary | ICD-10-CM

## 2019-02-22 DIAGNOSIS — R2681 Unsteadiness on feet: Secondary | ICD-10-CM

## 2019-02-22 DIAGNOSIS — R278 Other lack of coordination: Secondary | ICD-10-CM

## 2019-02-22 DIAGNOSIS — R2689 Other abnormalities of gait and mobility: Secondary | ICD-10-CM

## 2019-02-22 NOTE — Therapy (Signed)
[facility] [address] [place], [place], [address] Phone: [phone]   Fax:  [phone]  Occupational Therapy Treatment  Patient Details  Name: Aimee Parker MRN: [id number] Date of Birth: May 10, 1950 Referring Provider (OT): Dr. Alysia Penna   Encounter Date: 02/22/2019  OT End of Session - 02/22/19 0943    Visit Number  14    Number of Visits  17    Date for OT Re-Evaluation  02/24/19    Authorization Type  UHC MCR primary, MCD secondary    Authorization Time Period  week 8/8    Authorization - Visit Number  14    Authorization - Number of Visits  20    OT Start Time  0935    OT Stop Time  1015    OT Time Calculation (min)  40 min    Activity Tolerance  Patient tolerated treatment well    Behavior During Therapy  [facility] for tasks assessed/performed       Past Medical History:  Diagnosis Date  . Diabetes mellitus without complication ([place])   . Hypertension   . Vitamin D deficiency     Past Surgical History:  Procedure Laterality Date  . EYE SURGERY     Left eye surgery  . PARS PLANA VITRECTOMY Right 05/13/2017   Procedure: PARS PLANA VITRECTOMY 25 GAUGE FOR ENDOPHTHALMITIS;  Surgeon: Hurman Horn, MD;  Location: [facility];  Service: Ophthalmology;  Laterality: Right;    There were no vitals filed for this visit.  Subjective Assessment - 02/22/19 0943    Subjective   Thank you for all your hard work    Patient is accompanied by:  Family member;Interpreter    Pertinent History  CVA 10/2018. Admitted for septic shock on 11/01/18 and intubated 11/02/18. MRI showed CVA on 11/06/18.    Limitations  fall risk, needs interpreter    Patient Stated Goals  get balance better    Currently in Pain?  No/denies        Pt placing and removing washers on various height prongs multiple times RUE w/ 2 lb weight on wrist (rest breaks required) and min cues for positioning RUE to avoid sh abduction.  Pt shown in hand  manipulation coordination ex using coins and instructed to practice at home for palm to fingertip translation since pt had min difficulty with this. Pt reports no deficits in functional coordination (pt does buttons, tying shoes, etc w/o difficulty)  UBE x 8 min for UE strength/endurance  Standing: functional balance (diagonal patterns) w/ weight shifts and reaching outside BOS to place items on floor and then retrieve from floor to place back in cabinets (both sides) w/ supervision, no LOB and no countertop support needed.                      OT Short Term Goals - 02/08/19 8811      OT SHORT TERM GOAL #1   Title  Independent with HEP for BUE arm strengthening, Rt hand strengthening, and LUE gross motor coordination - extended to 01/20/2019 due to missed visits    Time  4    Period  Weeks    Status  Achieved      OT SHORT TERM GOAL #2   Title  Pt to bathe self w/ supervision and A/E prn    Baseline  min assist    Time  4    Period  Weeks    Status  Achieved      OT SHORT TERM GOAL #3   Title  Pt to improve Rt grip strength to 30 lbs for opening jars/containers    Baseline  22 lbs (Lt = 35 lbs)    Time  4    Period  Weeks    Status  Achieved   01/16/2019  27 pounds.  02/08/19:  30.6lbs     OT SHORT TERM GOAL #4   Title  Pt to stand to fold towels and wash dishes for 10 minutes w/o rest    Time  4    Period  Weeks    Status  Achieved   01/16/2019  15 minutes then rated fatigue 4/10     OT SHORT TERM GOAL #5   Title  Pt to make simple snack/sandwich w/ supervision    Time  4    Period  Weeks    Status  Achieved        OT Long Term Goals - 02/22/19 0944      OT LONG TERM GOAL #1   Title  Pt to return to simple cooking tasks at mod I level    Time  8    Period  Weeks    Status  Achieved   per pt report (however not always doing)     OT LONG TERM GOAL #2   Title  Pt to return to light household maintence/cleaning and laundry tasks mod I level    Time  8     Period  Weeks    Status  Achieved      OT LONG TERM GOAL #3   Title  Pt to attend to Rt side and perform tabletop visual scanning without cues    Time  8    Period  Weeks    Status  Achieved      OT LONG TERM GOAL #4   Title  Pt to demo sufficient strength BUE's to put groceries away and retrieve/replace items up to 3 lbs on high shelf    Time  8    Period  Weeks    Status  Achieved   able to lift 5 pound object x3 each UE           Plan - 02/22/19 0944    Clinical Impression Statement  Pt has met all STG's and LTG's. Pt has improved and doing all IADLS at home. Pt still fatigues RUE but does demo increased strength/endurance.    Occupational performance deficits (Please refer to evaluation for details):  ADL's;IADL's    Body Structure / Function / Physical Skills  ADL;IADL;Body mechanics;Balance;Mobility;Sensation;Coordination;UE functional use;Decreased knowledge of use of DME;Proprioception    Rehab Potential  Good    OT Frequency  2x / week    OT Treatment/Interventions  Self-care/ADL training;Therapeutic exercise;Functional Mobility Training;Neuromuscular education;Manual Therapy;Therapeutic activities;DME and/or AE instruction;Cognitive remediation/compensation;Visual/perceptual remediation/compensation;Moist Heat;Passive range of motion;Patient/family education    Plan  D/C O.Donnajean Lopes and Agree with Plan of Care  Patient;Family member/caregiver    Family Member Consulted  husband       Patient will benefit from skilled therapeutic intervention in order to improve the following deficits and impairments:   Body Structure / Function / Physical Skills: ADL, IADL, Body mechanics, Balance, Mobility, Sensation, Coordination, UE functional use, Decreased knowledge of use of DME, Proprioception       Visit Diagnosis: Muscle weakness (generalized)  Unsteadiness on feet    Problem List Patient Active Problem List  Diagnosis Date Noted  . DM (diabetes  mellitus), type 2 ([place]) 11/15/2018  . Embolic stroke ([place]) 35/82/5189  . Urinary tract infection without hematuria   . E coli bacteremia 11/08/2018  . Cerebral embolism with cerebral infarction 11/07/2018  . Pneumonia   . Pyelonephritis   . Endotracheal tube present   . Septic shock ([place])   . Sepsis ([place]) 11/01/2018  . Acute respiratory failure ([place]) 11/01/2018  . Endophthalmitis, acute 05/13/2017  . Healthcare maintenance 09/09/2016  . DM (diabetes mellitus) type 2, uncontrolled, with ketoacidosis ([place]) 05/17/2014  . Heart murmur 12/14/2013  . Cough 07/06/2013  . Hypertriglyceridemia 07/06/2013  . Type 2 diabetes mellitus without complication, with long-term current use of insulin ([place]) 07/06/2013  . Essential hypertension, benign 12/22/2012  . Hyperglycemia 11/17/2012  . Hypertension, uncontrolled 11/17/2012     OCCUPATIONAL THERAPY DISCHARGE SUMMARY  Visits from Start of Care: 14   Current functional level related to goals / functional outcomes: SEE ABOVE   Remaining deficits: Dynamic standing balance slightly impaired but no LOB RUE strength/endurance (but functional for IADLS)    Education / Equipment: HEP'S, fall prevention  Plan: Patient agrees to discharge.  Patient goals were met. Patient is being discharged due to meeting the stated rehab goals.  ?????          Carey Bullocks, OTR/L 02/22/2019, 9:48 AM  [facility] [place] [address] [place], [place], [address] Phone: [phone]   Fax:  [phone]  Name: Aimee Parker MRN: [id number] Date of Birth: 27-Oct-1950

## 2019-02-22 NOTE — Therapy (Signed)
[facility] [address] [place], [place], [address] Phone: [phone]   Fax:  [phone]  Physical Therapy Treatment  Patient Details  Name: Aimee Parker MRN: [id number] Date of Birth: Jul 26, 1950 Referring Provider (PT): Alysia Penna, MD   Encounter Date: 02/22/2019  PT End of Session - 02/22/19 0935    Visit Number  16    Number of Visits  20    Date for PT Re-Evaluation  04/09/19   written for 4 week POC   Authorization Type  UHC Medicare/medicaid (10th visit progress note needed)    PT Start Time  0848    PT Stop Time  0930    PT Time Calculation (min)  42 min    Equipment Utilized During Treatment  Gait belt   vietnamese interpreter service   Activity Tolerance  Patient tolerated treatment well    Behavior During Therapy  [facility] for tasks assessed/performed       Past Medical History:  Diagnosis Date  . Diabetes mellitus without complication ([place])   . Hypertension   . Vitamin D deficiency     Past Surgical History:  Procedure Laterality Date  . EYE SURGERY     Left eye surgery  . PARS PLANA VITRECTOMY Right 05/13/2017   Procedure: PARS PLANA VITRECTOMY 25 GAUGE FOR ENDOPHTHALMITIS;  Surgeon: Hurman Horn, MD;  Location: [facility];  Service: Ophthalmology;  Laterality: Right;    There were no vitals filed for this visit.  Subjective Assessment - 02/22/19 0852    Subjective  Has not had any dizziness at home. Doing well. No falls.    Patient is accompained by:  Family member;Interpreter   Aimee Parker (pronounced)   Pertinent History  HTN, DMII    Limitations  Walking;House hold activities    Patient Stated Goals  "I want to be like I was before."    Currently in Pain?  No/denies                       [facility] Adult PT Treatment/Exercise - 02/22/19 0914      Ambulation/Gait   Ambulation/Gait  Yes    Ambulation/Gait Assistance  5: Supervision;4: Min guard    Ambulation Distance (Feet)   500 Feet    Assistive device  None    Gait Pattern  Step-through pattern    Ambulation Surface  Level;Indoor    Gait Comments  Gait with head turns - 4 x 40', gait with head nods 4 x 40', cues to maintain gait speed and arm swing. No LOB and pt reporting no dizziness when performing. Therapist placing 8 cones around therapy gym -pt scanning environment looking for 8 cones and then performing a squat with no UE support to pick objects up from the floor, found 7 during 1st lap, needing minimal cues to find last cone during 2nd lap.  No LOB, cues for gait speed.       Knee/Hip Exercises: Standing   Lateral Step Up  Both;1 set;10 reps;Hand Hold: 0;Step Height: 6"    Lateral Step Up Limitations  cues for eccentric control and tapping heel on the ground first     Forward Step Up  Both;1 set;10 reps;Hand Hold: 0;Step Height: 6"    Forward Step Up Limitations  cues to float opposite leg in the air vs. touching step for additional balance challenge           Balance Exercises - 02/22/19 0931  Balance Exercises: Standing   Standing Eyes Closed  4 reps;10 secs;20 secs;Other (comment)   on rockerboard, min guard/min A for balance   SLS  Eyes open;Foam/compliant surface;Intermittent upper extremity support;1 rep;Other (comment);Limitations   1 x 10 reps   SLS Limitations  stepping LLE onto blue balance disc on floor and then tapping RLE to green cone for SLS of LLE, needing demonstrative cues at first, min gurad/min A for safety    Rockerboard  Anterior/posterior;10 reps    Rockerboard Limitations  cues for hip/ankle strategy. trying to keep board level: 1 x 10 reps head turns, 1 x  10 reps head nods (needed min A for 1 episode of LOB posteriorly)          PT Short Term Goals - 01/16/19 1026      PT SHORT TERM GOAL #1   Title  Pt will be ind with initial HEP in order to improve balance and functional mobility.  (Target Date: 01/11/19)    Baseline  pt reports she is independent with HEP     Time  4    Period  Weeks    Status  Achieved    Target Date  01/11/19      PT SHORT TERM GOAL #2   Title  Pt will improve BERG balance score to 46/56 in order to indicate decreased fall risk.    Baseline  47/56    Status  Achieved      PT SHORT TERM GOAL #3   Title  Pt will improve gait speed to >/=2.62 ft/sec with LRAD in order to indicate safe community ambulation.    Baseline  16.22 secs = 2.02  ft/sec, 16.34 sec = 2.01 ft/sec with SPC    Status  Not Met      PT SHORT TERM GOAL #4   Title  Pt will perform 5TSS in </=11 secs without UE support and no reliance of LEs on chair for support to indicate improved functional strength.    Baseline  11.59 seconds without UE support.    Status  Partially Met      PT SHORT TERM GOAL #5   Title  Pt will ambulate x 100' at mod I level in home without AD in order to indicate improved independence in home.    Baseline  x100' with supervision with no AD    Status  Partially Met      PT SHORT TERM GOAL #6   Title  Pt will ambulate x 500' w/ LRAD at S level over unlevel paved surfaces in order to indicate safe community ambulation.    Baseline  able to ambulate 300' with [facility] outsdoors with supervision (unable to perform full 500' due to weather)    Status  Partially Met        PT Long Term Goals - 02/08/19 1158      PT LONG TERM GOAL #1   Title  Pt will be ind with final HEP in order to indicate decreased fall risk and improved functional mobility (ALL LTGS DUE 03/08/19)    Baseline  pt will continue to benefit from ongoing revisions to HEP for strength and balance    Time  4    Period  Weeks    Status  On-going    Target Date  03/08/19      PT LONG TERM GOAL #2   Title  Pt will improve BERG balance score to >/=52/56 in order to indicate decreased fall risk.  Baseline  50/56 on 02/08/19    Time  4    Period  Weeks    Status  Revised      PT LONG TERM GOAL #3   Title  Pt will ambulate over varying outdoor surfaces x 1000' without  AD at S level in order to indicate more independent community negotiation.    Time  4    Period  Weeks    Status  On-going      PT LONG TERM GOAL #4   Title  Patient will improve FGA score to at least a 21/30 in order to demo decr fall risk.    Baseline  17/30 on 02/08/19    Status  Revised            Plan - 02/22/19 1142    Clinical Impression Statement  focus of today's skilled session was dynamic gait training, LE strengthening, and balance on compliant surfaces. Pt with no reports of dizziness during today's session, better able to maintain gait speed today while scanning environment. Pt is progressing well - will continue to progress towards LTGs.    Personal Factors and Comorbidities  Comorbidity 2;Comorbidity 3+    Comorbidities  HTN, DMII, CVA    Examination-Activity Limitations  Caring for Others;Stairs;Locomotion Level;Carry    Examination-Participation Restrictions  Community Activity;Medication Management;Laundry    Stability/Clinical Decision Making  Evolving/Moderate complexity    Rehab Potential  Excellent    PT Frequency  2x / week    PT Duration  4 weeks    PT Treatment/Interventions  ADLs/Self Care Home Management;Aquatic Therapy;DME Instruction;Gait training;Stair training;Functional mobility training;Therapeutic activities;Therapeutic exercise;Balance training;Neuromuscular re-education;Cognitive remediation;Patient/family education;Orthotic Fit/Training;Passive range of motion;Vestibular    PT Next Visit Plan  step ups/side stepping w/ t band. rockerboard dynamic balance, stair training. backwards gait, work on dynamic gait tasks with no AD. gait outdoors/over compliant surfaces. eyes closed balance on compliant surfaces (assess for dizziness).    PT Home Exercise Plan  [email or web address]    Consulted and Agree with Plan of Care  Patient;Other (Comment)   husband/pt through interpretor      Patient will benefit from skilled therapeutic intervention in order to improve the  following deficits and impairments:  Abnormal gait, Decreased activity tolerance, Decreased balance, Decreased cognition, Decreased endurance, Decreased knowledge of precautions, Decreased mobility, Decreased strength, Decreased safety awareness, Impaired perceived functional ability, Impaired flexibility, Postural dysfunction  Visit Diagnosis: Muscle weakness (generalized)  Unsteadiness on feet  Other lack of coordination  Other abnormalities of gait and mobility     Problem List Patient Active Problem List   Diagnosis Date Noted  . DM (diabetes mellitus), type 2 ([place]) 11/15/2018  . Embolic stroke ([place]) 53/64/6803  . Urinary tract infection without hematuria   . E coli bacteremia 11/08/2018  . Cerebral embolism with cerebral infarction 11/07/2018  . Pneumonia   . Pyelonephritis   . Endotracheal tube present   . Septic shock ([place])   . Sepsis ([place]) 11/01/2018  . Acute respiratory failure ([place]) 11/01/2018  . Endophthalmitis, acute 05/13/2017  . Healthcare maintenance 09/09/2016  . DM (diabetes mellitus) type 2, uncontrolled, with ketoacidosis ([place]) 05/17/2014  . Heart murmur 12/14/2013  . Cough 07/06/2013  . Hypertriglyceridemia 07/06/2013  . Type 2 diabetes mellitus without complication, with long-term current use of insulin ([place]) 07/06/2013  . Essential hypertension, benign 12/22/2012  . Hyperglycemia 11/17/2012  . Hypertension, uncontrolled 11/17/2012    Arliss Journey , PT, DPT  02/22/2019, 11:45 AM  [facility]  [place], [place], [address] Phone: [phone]   Fax:  [phone]  Name: Aimee Parker MRN: [id number] Date of Birth: 01/01/1951

## 2019-02-27 ENCOUNTER — Ambulatory Visit: Payer: Medicare Other | Admitting: Occupational Therapy

## 2019-02-27 ENCOUNTER — Ambulatory Visit: Payer: Medicare Other | Admitting: Physical Therapy

## 2019-02-27 ENCOUNTER — Other Ambulatory Visit: Payer: Self-pay

## 2019-02-27 DIAGNOSIS — R2689 Other abnormalities of gait and mobility: Secondary | ICD-10-CM

## 2019-02-27 DIAGNOSIS — R2681 Unsteadiness on feet: Secondary | ICD-10-CM

## 2019-02-27 DIAGNOSIS — R278 Other lack of coordination: Secondary | ICD-10-CM

## 2019-02-27 DIAGNOSIS — M6281 Muscle weakness (generalized): Secondary | ICD-10-CM

## 2019-02-27 NOTE — Therapy (Signed)
[facility] [address] [place], [place], [address] Phone: [phone]   Fax:  [phone]  Physical Therapy Treatment  Patient Details  Name: Aimee Parker MRN: [id number] Date of Birth: January 22, 1950 Referring Provider (PT): Alysia Penna, MD   Encounter Date: 02/27/2019  PT End of Session - 02/27/19 1106    Visit Number  17    Number of Visits  20    Date for PT Re-Evaluation  04/09/19   written for 4 week POC   Authorization Type  [employer]HC Medicare/medicaid (10th visit progress note needed)    PT Start Time  0928    PT Stop Time  1014    PT Time Calculation (min)  46 min    Equipment Utilized During Treatment  Gait belt   vietnamese interpreter service   Activity Tolerance  Patient tolerated treatment well    Behavior During Therapy  [facility] for tasks assessed/performed       Past Medical History:  Diagnosis Date  . Diabetes mellitus without complication ([place])   . Hypertension   . Vitamin D deficiency     Past Surgical History:  Procedure Laterality Date  . EYE SURGERY     Left eye surgery  . PARS PLANA VITRECTOMY Right 05/13/2017   Procedure: PARS PLANA VITRECTOMY 25 GAUGE FOR ENDOPHTHALMITIS;  Surgeon: Hurman Horn, MD;  Location: [facility];  Service: Ophthalmology;  Laterality: Right;    There were no vitals filed for this visit.  Subjective Assessment - 02/27/19 0930    Subjective  Doing well. No dizziness. No falls. Doing exercises at home.    Patient is accompained by:  Family member;Interpreter   [name] (pronounced)   Pertinent History  HTN, DMII    Limitations  Walking;House hold activities    Patient Stated Goals  "I want to be like I was before."    Currently in Pain?  No/denies                       [facility] Adult PT Treatment/Exercise - 02/27/19 1111      Exercises   Exercises  Other Exercises    Other Exercises   In quadruped position, verbal and demonstrative cues for proper  technique: alternating LE lifts 2 x 7 reps B (cues for hip extension and manual cues at pelvis for weight shift), then attempting alternating BUE and BLE lifts 5 reps each side, tactile cues for weight shifting, min A for balance, increased difficulty kicking RLE back and weight shifting towards LLE. Tall kneeling mini squats 1 x 10 reps with hands on red physioball, cues for glute activation. Half kneel position with single UE support on ball, 2 x 30-45 seconds B, manual and verbal cues for weight shift and for glute activation. 2nd rep having pt perform alternating UE lifts 1 x 8 reps, with manual and verbal cues for posture, min guard for balance. '         Access Code: [email or web address]  URL: [email or web address]  Date: 02/27/2019  Prepared by: Janann August   Upgraded pt's HEP - bolded are reviewed/upgraded.   Exercises Walking March - 3 reps - 1 sets - 2x daily - 7x weekly -cues to perform with no UE support at counter  Side Stepping with Resistance at Thighs and Counter Support - 10 reps - 3 sets - 2x daily - 7x weekly -upgraded side stepping to include side stepping with green band around thighs, cues for technique  Heel Walking with Counter Support - 3 reps - 1 sets - 3x daily - 7x weekly -cues for technique, forwards walking only, needing countertop support   Tandem Walking with Counter Support - 3 reps - 1 sets - 2x daily - 7x weekly - reviewed with cueing to try to perform with single hand "floating" above countertop in case pt needs it for balance    Toe Walking with Counter Support - 3 reps - 1 sets - 2x daily - 7x weekly -cues to perform forward walking with no UE support and then retro toe walking with UE support at counter   Backward Walking with Counter Support - 3 reps - 1 sets - 2x daily - 7x weekly Sit to Stand - 10 reps - 2 sets - 2x daily - 7x weekly Walking with Head Nod - 3 sets - 2x daily - 7x weekly Walking with Head Rotation - 3 sets - 2x daily - 7x  weekly Supine Bridge - 10 reps - 2 sets - 2x daily - 5x weekly Clamshell with Resistance - 10 reps - 2 sets - 1x daily - 5x weekly Romberg Stance Eyes Closed on Foam Pad - 3 sets - 30 hold - 2x daily - 5x weekly     PT Education - 02/27/19 1105    Education Details  new additions/upgrades to [employer]) Educated  Patient    Methods  Explanation;Demonstration;Handout;Verbal cues    Comprehension  Verbalized understanding;Returned demonstration       PT Short Term Goals - 01/16/19 1026      PT SHORT TERM GOAL #1   Title  Pt will be ind with initial HEP in order to improve balance and functional mobility.  (Target Date: 01/11/19)    Baseline  pt reports she is independent with HEP    Time  4    Period  Weeks    Status  Achieved    Target Date  01/11/19      PT SHORT TERM GOAL #2   Title  Pt will improve BERG balance score to 46/56 in order to indicate decreased fall risk.    Baseline  47/56    Status  Achieved      PT SHORT TERM GOAL #3   Title  Pt will improve gait speed to >/=2.62 ft/sec with LRAD in order to indicate safe community ambulation.    Baseline  16.22 secs = 2.02  ft/sec, 16.34 sec = 2.01 ft/sec with SPC    Status  Not Met      PT SHORT TERM GOAL #4   Title  Pt will perform 5TSS in </=11 secs without UE support and no reliance of LEs on chair for support to indicate improved functional strength.    Baseline  11.59 seconds without UE support.    Status  Partially Met      PT SHORT TERM GOAL #5   Title  Pt will ambulate x 100' at mod I level in home without AD in order to indicate improved independence in home.    Baseline  x100' with supervision with no AD    Status  Partially Met      PT SHORT TERM GOAL #6   Title  Pt will ambulate x 500' w/ LRAD at S level over unlevel paved surfaces in order to indicate safe community ambulation.    Baseline  able to ambulate 300' with [facility] outsdoors with supervision (unable to perform full 500' due to weather)  Status  Partially Met        PT Long Term Goals - 02/08/19 1158      PT LONG TERM GOAL #1   Title  Pt will be ind with final HEP in order to indicate decreased fall risk and improved functional mobility (ALL LTGS DUE 03/08/19)    Baseline  pt will continue to benefit from ongoing revisions to HEP for strength and balance    Time  4    Period  Weeks    Status  On-going    Target Date  03/08/19      PT LONG TERM GOAL #2   Title  Pt will improve BERG balance score to >/=52/56 in order to indicate decreased fall risk.    Baseline  50/56 on 02/08/19    Time  4    Period  Weeks    Status  Revised      PT LONG TERM GOAL #3   Title  Pt will ambulate over varying outdoor surfaces x 1000' without AD at S level in order to indicate more independent community negotiation.    Time  4    Period  Weeks    Status  On-going      PT LONG TERM GOAL #4   Title  Patient will improve FGA score to at least a 21/30 in order to demo decr fall risk.    Baseline  17/30 on 02/08/19    Status  Revised            Plan - 02/27/19 1107    Clinical Impression Statement  Focus of today's session was upgrading pt's HEP as well as activities in quadraped/tall kneeling for LE and core strengthening. Pt with incr difficulty in quadraped, especially with weight shifting towards LLE and lifting RLE out into extension, needing min guard and assist at pelvis to help maintain position and for weight shift. Increased fatigue after performing. Pt needed demonstrative and verbal cues for upgrades to HEP - provided pt new handout with pictures. Will continue to progress towards LTGs.    Personal Factors and Comorbidities  Comorbidity 2;Comorbidity 3+    Comorbidities  HTN, DMII, CVA    Examination-Activity Limitations  Caring for Others;Stairs;Locomotion Level;Carry    Examination-Participation Restrictions  Community Activity;Medication Management;Laundry    Stability/Clinical Decision Making  Evolving/Moderate  complexity    Rehab Potential  Excellent    PT Frequency  2x / week    PT Duration  4 weeks    PT Treatment/Interventions  ADLs/Self Care Home Management;Aquatic Therapy;DME Instruction;Gait training;Stair training;Functional mobility training;Therapeutic activities;Therapeutic exercise;Balance training;Neuromuscular re-education;Cognitive remediation;Patient/family education;Orthotic Fit/Training;Passive range of motion;Vestibular    PT Next Visit Plan  step ups/side stepping w/ t band (maybe forward and backwards walking too). rockerboard dynamic balance, stair training. backwards gait, work on dynamic gait tasks with no AD. gait outdoors/over compliant surfaces. eyes closed balance on compliant surfaces (    PT Home Exercise Plan  [email or web address]    Consulted and Agree with Plan of Care  Patient;Other (Comment)   husband/pt through interpretor      Patient will benefit from skilled therapeutic intervention in order to improve the following deficits and impairments:  Abnormal gait, Decreased activity tolerance, Decreased balance, Decreased cognition, Decreased endurance, Decreased knowledge of precautions, Decreased mobility, Decreased strength, Decreased safety awareness, Impaired perceived functional ability, Impaired flexibility, Postural dysfunction  Visit Diagnosis: Unsteadiness on feet  Muscle weakness (generalized)  Other lack of coordination  Other abnormalities of gait and mobility  Problem List Patient Active Problem List   Diagnosis Date Noted  . DM (diabetes mellitus), type 2 ([place]) 11/15/2018  . Embolic stroke ([place]) 58/30/9407  . Urinary tract infection without hematuria   . E coli bacteremia 11/08/2018  . Cerebral embolism with cerebral infarction 11/07/2018  . Pneumonia   . Pyelonephritis   . Endotracheal tube present   . Septic shock ([place])   . Sepsis ([place]) 11/01/2018  . Acute respiratory failure ([place]) 11/01/2018  . Endophthalmitis, acute 05/13/2017  . Healthcare  maintenance 09/09/2016  . DM (diabetes mellitus) type 2, uncontrolled, with ketoacidosis ([place]) 05/17/2014  . Heart murmur 12/14/2013  . Cough 07/06/2013  . Hypertriglyceridemia 07/06/2013  . Type 2 diabetes mellitus without complication, with long-term current use of insulin ([place]) 07/06/2013  . Essential hypertension, benign 12/22/2012  . Hyperglycemia 11/17/2012  . Hypertension, uncontrolled 11/17/2012    Arliss Journey, PT, DPT  02/27/2019, 11:12 AM  [facility] [address] [place], [place], [address] Phone: [phone]   Fax:  [phone]  Name: Lakeyn Dokken MRN: [id number] Date of Birth: 12-11-1950

## 2019-02-27 NOTE — Patient Instructions (Signed)
Access Code: [email or web address]  URL: [email or web address]  Date: 02/27/2019  Prepared by: Sherlie Ban   Exercises Walking March - 3 reps - 1 sets - 2x daily - 7x weekly Side Stepping with Resistance at [employer] and Counter Support - 10 reps - 3 sets - 2x daily - 7x weekly Heel Walking with Counter Support - 3 reps - 1 sets - 3x daily - 7x weekly Tandem Walking with Counter Support - 3 reps - 1 sets - 2x daily - 7x weekly Backward Walking with Counter Support - 3 reps - 1 sets - 2x daily - 7x weekly Toe Walking with Counter Support - 3 reps - 1 sets - 2x daily - 7x weekly Sit to Stand - 10 reps - 2 sets - 2x daily - 7x weekly Walking with Head Nod - 3 sets - 2x daily - 7x weekly Walking with Head Rotation - 3 sets - 2x daily - 7x weekly Supine Bridge - 10 reps - 2 sets - 2x daily - 5x weekly Clamshell with Resistance - 10 reps - 2 sets - 1x daily - 5x weekly Romberg Stance Eyes Closed on Foam Pad - 3 sets - 30 hold - 2x daily - 5x weekly

## 2019-03-01 ENCOUNTER — Ambulatory Visit: Payer: Medicare Other | Admitting: Physical Therapy

## 2019-03-01 ENCOUNTER — Encounter: Payer: Medicare Other | Admitting: Occupational Therapy

## 2019-03-01 ENCOUNTER — Other Ambulatory Visit: Payer: Self-pay

## 2019-03-01 DIAGNOSIS — R2689 Other abnormalities of gait and mobility: Secondary | ICD-10-CM

## 2019-03-01 DIAGNOSIS — R2681 Unsteadiness on feet: Secondary | ICD-10-CM

## 2019-03-01 DIAGNOSIS — M6281 Muscle weakness (generalized): Secondary | ICD-10-CM

## 2019-03-01 DIAGNOSIS — R278 Other lack of coordination: Secondary | ICD-10-CM

## 2019-03-01 NOTE — Therapy (Signed)
[facility] [address] [place], [place], [address] Phone: [phone]   Fax:  [phone]  Physical Therapy Treatment  Patient Details  Name: Aimee Parker MRN: [id number] Date of Birth: Aug 20, 1950 Referring Provider (PT): Alysia Penna, MD   Encounter Date: 03/01/2019  PT End of Session - 03/01/19 1045    Visit Number  18    Number of Visits  20    Date for PT Re-Evaluation  04/09/19   written for 4 week POC   Authorization Type  [employer] Medicare/medicaid (10th visit progress note needed)    PT Start Time  0848    PT Stop Time  0930    PT Time Calculation (min)  42 min    Equipment Utilized During Treatment  Gait belt   vietnamese interpreter service   Activity Tolerance  Patient tolerated treatment well    Behavior During Therapy  [facility] for tasks assessed/performed       Past Medical History:  Diagnosis Date  . Diabetes mellitus without complication ([place])   . Hypertension   . Vitamin D deficiency     Past Surgical History:  Procedure Laterality Date  . EYE SURGERY     Left eye surgery  . PARS PLANA VITRECTOMY Right 05/13/2017   Procedure: PARS PLANA VITRECTOMY 25 GAUGE FOR ENDOPHTHALMITIS;  Surgeon: Hurman Horn, MD;  Location: [facility];  Service: Ophthalmology;  Laterality: Right;    There were no vitals filed for this visit.  Subjective Assessment - 03/01/19 0851    Subjective  Doing well, did the new exercises - states that they are a little more challenging.    Patient is accompained by:  Family member;Interpreter   [name] (pronounced)   Pertinent History  HTN, DMII    Limitations  Walking;House hold activities    Patient Stated Goals  "I want to be like I was before."    Currently in Pain?  No/denies                       [facility] Adult PT Treatment/Exercise - 03/01/19 0001      Neuro Re-ed    Neuro Re-ed Details   Next to countertop on blue/red floor mats: stepping over 4  obstacles/hurdles of different heights forward down and back 4 reps, cues for weight shift and reciprocal pattern as well as increased hip/knee flexion when stepping over, needing to hold on to counter for initial rep, followed by min guard for remaining 3. Stepping over 4 obstacles laterally down and back 3 reps, cues for weight shift and taking larger step lengths down and back 3 reps, min guard for balance, pt hitting obstacles on 2 occasions. With single higher hurdle: side steps over to the R and then back to the left x 10 reps on blue mat, progressed difficulty and incr compliant surface by folding mat over - additional 10 reps with min guard for balance. Stepping stones for SLS next to counter top down and back 4 reps with initial UE support, progressing to min guard and adding additional challenge of spreading stepping stones out for increased step length.           Balance Exercises - 03/01/19 1048      Balance Exercises: Standing   Standing Eyes Closed  Narrow base of support (BOS);Foam/compliant surface;3 reps;30 secs    SLS  Eyes open;Foam/compliant surface;2 reps;Limitations    SLS Limitations  standing on blue foam beam: alternating SLS  taps to single cone 1 x 10 reps, progressing to SLS forward tap and then cross body tap to 2nd cone, min guard for safety and pt needing bars for balance at times     Other Standing Exercises  on foam with feet slightly less than hip width distance: 3 x 5 reps right and left head turns, 3 x 5 reps head nods up and down           PT Short Term Goals - 01/16/19 1026      PT SHORT TERM GOAL #1   Title  Pt will be ind with initial HEP in order to improve balance and functional mobility.  (Target Date: 01/11/19)    Baseline  pt reports she is independent with HEP    Time  4    Period  Weeks    Status  Achieved    Target Date  01/11/19      PT SHORT TERM GOAL #2   Title  Pt will improve BERG balance score to 46/56 in order to indicate decreased  fall risk.    Baseline  47/56    Status  Achieved      PT SHORT TERM GOAL #3   Title  Pt will improve gait speed to >/=2.62 ft/sec with LRAD in order to indicate safe community ambulation.    Baseline  16.22 secs = 2.02  ft/sec, 16.34 sec = 2.01 ft/sec with SPC    Status  Not Met      PT SHORT TERM GOAL #4   Title  Pt will perform 5TSS in </=11 secs without UE support and no reliance of LEs on chair for support to indicate improved functional strength.    Baseline  11.59 seconds without UE support.    Status  Partially Met      PT SHORT TERM GOAL #5   Title  Pt will ambulate x 100' at mod I level in home without AD in order to indicate improved independence in home.    Baseline  x100' with supervision with no AD    Status  Partially Met      PT SHORT TERM GOAL #6   Title  Pt will ambulate x 500' w/ LRAD at S level over unlevel paved surfaces in order to indicate safe community ambulation.    Baseline  able to ambulate 300' with [facility] outsdoors with supervision (unable to perform full 500' due to weather)    Status  Partially Met        PT Long Term Goals - 02/08/19 1158      PT LONG TERM GOAL #1   Title  Pt will be ind with final HEP in order to indicate decreased fall risk and improved functional mobility (ALL LTGS DUE 03/08/19)    Baseline  pt will continue to benefit from ongoing revisions to HEP for strength and balance    Time  4    Period  Weeks    Status  On-going    Target Date  03/08/19      PT LONG TERM GOAL #2   Title  Pt will improve BERG balance score to >/=52/56 in order to indicate decreased fall risk.    Baseline  50/56 on 02/08/19    Time  4    Period  Weeks    Status  Revised      PT LONG TERM GOAL #3   Title  Pt will ambulate over varying outdoor surfaces x 1000' without AD  at S level in order to indicate more independent community negotiation.    Time  4    Period  Weeks    Status  On-going      PT LONG TERM GOAL #4   Title  Patient will improve FGA  score to at least a 21/30 in order to demo decr fall risk.    Baseline  17/30 on 02/08/19    Status  Revised            Plan - 03/01/19 1046    Clinical Impression Statement  Focus of today's skilled session was corner balance activities for vestibular system, SLS training, and obstacle negotiation. Pt now able to perform more narrow BOS on foam for 30 seconds, indicating improved vestibular input for balance, no dizziness reported with corner balance. Did have mild 2/10 dizziness after performing SLS taps to cones on blue foam beam, pt stating it was due to being on an unlevel surface. Pt is progressing well, will continue to progress towards LTGs.    Personal Factors and Comorbidities  Comorbidity 2;Comorbidity 3+    Comorbidities  HTN, DMII, CVA    Examination-Activity Limitations  Caring for Others;Stairs;Locomotion Level;Carry    Examination-Participation Restrictions  Community Activity;Medication Management;Laundry    Stability/Clinical Decision Making  Evolving/Moderate complexity    Rehab Potential  Excellent    PT Frequency  2x / week    PT Duration  4 weeks    PT Treatment/Interventions  ADLs/Self Care Home Management;Aquatic Therapy;DME Instruction;Gait training;Stair training;Functional mobility training;Therapeutic activities;Therapeutic exercise;Balance training;Neuromuscular re-education;Cognitive remediation;Patient/family education;Orthotic Fit/Training;Passive range of motion;Vestibular    PT Next Visit Plan  continue balance work on unstable surfaces, check LTGs, finalize HEP.    PT Home Exercise Plan  [id number]    Consulted and Agree with Plan of Care  Patient;Other (Comment)   husband/pt through interpretor      Patient will benefit from skilled therapeutic intervention in order to improve the following deficits and impairments:  Abnormal gait, Decreased activity tolerance, Decreased balance, Decreased cognition, Decreased endurance, Decreased knowledge of  precautions, Decreased mobility, Decreased strength, Decreased safety awareness, Impaired perceived functional ability, Impaired flexibility, Postural dysfunction  Visit Diagnosis: Other lack of coordination  Unsteadiness on feet  Muscle weakness (generalized)  Other abnormalities of gait and mobility     Problem List Patient Active Problem List   Diagnosis Date Noted  . DM (diabetes mellitus), type 2 ([place]) 11/15/2018  . Embolic stroke ([place]) 93/57/0177  . Urinary tract infection without hematuria   . E coli bacteremia 11/08/2018  . Cerebral embolism with cerebral infarction 11/07/2018  . Pneumonia   . Pyelonephritis   . Endotracheal tube present   . Septic shock ([place])   . Sepsis ([place]) 11/01/2018  . Acute respiratory failure ([place]) 11/01/2018  . Endophthalmitis, acute 05/13/2017  . Healthcare maintenance 09/09/2016  . DM (diabetes mellitus) type 2, uncontrolled, with ketoacidosis ([place]) 05/17/2014  . Heart murmur 12/14/2013  . Cough 07/06/2013  . Hypertriglyceridemia 07/06/2013  . Type 2 diabetes mellitus without complication, with long-term current use of insulin ([place]) 07/06/2013  . Essential hypertension, benign 12/22/2012  . Hyperglycemia 11/17/2012  . Hypertension, uncontrolled 11/17/2012    Arliss Journey, PT, DPT  03/01/2019, 10:55 AM  [facility] [address] [place], [place], [address] Phone: [phone]   Fax:  [phone]  Name: Aimee Parker MRN: [id number] Date of Birth: 11-12-1950

## 2019-03-06 ENCOUNTER — Ambulatory Visit: Payer: Medicare Other | Admitting: Physical Therapy

## 2019-03-06 ENCOUNTER — Encounter: Payer: Medicare Other | Admitting: Occupational Therapy

## 2019-03-06 ENCOUNTER — Other Ambulatory Visit: Payer: Self-pay

## 2019-03-06 DIAGNOSIS — R2681 Unsteadiness on feet: Secondary | ICD-10-CM | POA: Diagnosis not present

## 2019-03-06 DIAGNOSIS — R278 Other lack of coordination: Secondary | ICD-10-CM

## 2019-03-06 DIAGNOSIS — M6281 Muscle weakness (generalized): Secondary | ICD-10-CM

## 2019-03-06 DIAGNOSIS — R2689 Other abnormalities of gait and mobility: Secondary | ICD-10-CM

## 2019-03-06 NOTE — Therapy (Signed)
[facility] [address] [place], [place], [address] Phone: [phone]   Fax:  [phone]  Physical Therapy Treatment  Patient Details  Name: Aimee Parker MRN: [id number] Date of Birth: 25-Apr-1950 Referring Provider (PT): Alysia Penna, MD   Encounter Date: 03/06/2019  PT End of Session - 03/06/19 1135    Visit Number  19    Number of Visits  20    Date for PT Re-Evaluation  04/09/19   written for 4 week POC   Authorization Type  [employer] Medicare/medicaid (10th visit progress note needed)    PT Start Time  0932    PT Stop Time  1016    PT Time Calculation (min)  44 min    Equipment Utilized During Treatment  Gait belt   vietnamese interpreter service   Activity Tolerance  Patient tolerated treatment well    Behavior During Therapy  [name] for tasks assessed/performed       Past Medical History:  Diagnosis Date  . Diabetes mellitus without complication ([place])   . Hypertension   . Vitamin D deficiency     Past Surgical History:  Procedure Laterality Date  . EYE SURGERY     Left eye surgery  . PARS PLANA VITRECTOMY Right 05/13/2017   Procedure: PARS PLANA VITRECTOMY 25 GAUGE FOR ENDOPHTHALMITIS;  Surgeon: Hurman Horn, MD;  Location: [facility];  Service: Ophthalmology;  Laterality: Right;    There were no vitals filed for this visit.  Subjective Assessment - 03/06/19 0935    Subjective  No falls. Doing well. Reports not having any difficulties with her balance at home.    Patient is accompained by:  Family member;Interpreter   [name] (pronounced)   Pertinent History  HTN, DMII    Limitations  Walking;House hold activities    Patient Stated Goals  "I want to be like I was before."    Currently in Pain?  No/denies         [facility] PT Assessment - 03/06/19 0937      Berg Balance Test   Sit to Stand  Able to stand without using hands and stabilize independently    Standing Unsupported  Able to stand safely 2  minutes    Sitting with Back Unsupported but Feet Supported on Floor or Stool  Able to sit safely and securely 2 minutes    Stand to Sit  Sits safely with minimal use of hands    Transfers  Able to transfer safely, minor use of hands    Standing Unsupported with Eyes Closed  Able to stand 10 seconds safely    Standing Unsupported with Feet Together  Able to place feet together independently and stand 1 minute safely    From Standing, Reach Forward with Outstretched Arm  Can reach confidently >25 cm (10")    From Standing Position, Pick up Object from Floor  Able to pick up shoe safely and easily    From Standing Position, Turn to Look Behind Over each Shoulder  Looks behind from both sides and weight shifts well    Turn 360 Degrees  Able to turn 360 degrees safely in 4 seconds or less   2/10 dizziness   Standing Unsupported, Alternately Place Feet on Step/Stool  Able to stand independently and safely and complete 8 steps in 20 seconds    Standing Unsupported, One Foot in Front  Able to place foot tandem independently and hold 30 seconds    Standing on One  Leg  Able to lift leg independently and hold 5-10 seconds    Total Score  55    Berg comment:  55/56                   [facility] Adult PT Treatment/Exercise - 03/06/19 0001      Therapeutic Activites    Therapeutic Activities  Other Therapeutic Activities    Other Therapeutic Activities  When performing turns on the BERG, pt with short shuffled steps and mild dizziness after performing. Educated pt on to perform turns using the "clock" method and taking large steps while leading the turns with her toes. Practiced x3 reps each side with pt able to perform correctly with no reported dizziness. Educated pt on performing turns at home with wide/large steps       Neuro Re-ed    Neuro Re-ed Details   On blue side of BOSU with BUE support: 1 x 10 reps B forward step ups, on black side of BOSU with BUE support: medial lateral weight  shifting 1 x 15 reps - with cues for performing slowed and controlled, anterior posterior weight shifting 1 x 10 reps, holding balance still on BOSU with wide BOS and no UE support 2 x 30 seconds, with no UE support and eyes open 2 x 5 reps head turns R/L - min guard/min A for balance.      Exercises   Exercises  Other Exercises    Other Exercises   With green theraband around distal thighs side stepping down and back while holding mini squat 4 x 30' with cues for proper technique by pointing toes forward. Side stepping down and back 2 x 15' with green theraband around ankles - cues for step width.           Balance Exercises - 03/06/19 1142      Balance Exercises: Standing   Rockerboard  EC;Limitations    Rockerboard Limitations  keeping board steady - eyes closed 5 x 10-15 second reps, with intermittent min A for pt losing balance posteriorly          PT Short Term Goals - 01/16/19 1026      PT SHORT TERM GOAL #1   Title  Pt will be ind with initial HEP in order to improve balance and functional mobility.  (Target Date: 01/11/19)    Baseline  pt reports she is independent with HEP    Time  4    Period  Weeks    Status  Achieved    Target Date  01/11/19      PT SHORT TERM GOAL #2   Title  Pt will improve BERG balance score to 46/56 in order to indicate decreased fall risk.    Baseline  47/56    Status  Achieved      PT SHORT TERM GOAL #3   Title  Pt will improve gait speed to >/=2.62 ft/sec with LRAD in order to indicate safe community ambulation.    Baseline  16.22 secs = 2.02  ft/sec, 16.34 sec = 2.01 ft/sec with SPC    Status  Not Met      PT SHORT TERM GOAL #4   Title  Pt will perform 5TSS in </=11 secs without UE support and no reliance of LEs on chair for support to indicate improved functional strength.    Baseline  11.59 seconds without UE support.    Status  Partially Met      PT SHORT TERM  GOAL #5   Title  Pt will ambulate x 100' at mod I level in home  without AD in order to indicate improved independence in home.    Baseline  x100' with supervision with no AD    Status  Partially Met      PT SHORT TERM GOAL #6   Title  Pt will ambulate x 500' w/ LRAD at S level over unlevel paved surfaces in order to indicate safe community ambulation.    Baseline  able to ambulate 300' with [facility] outsdoors with supervision (unable to perform full 500' due to weather)    Status  Partially Met        PT Long Term Goals - 03/06/19 5681      PT LONG TERM GOAL #1   Title  Pt will be ind with final HEP in order to indicate decreased fall risk and improved functional mobility (ALL LTGS DUE 03/08/19)    Baseline  pt will continue to benefit from ongoing revisions to HEP for strength and balance    Time  4    Period  Weeks    Status  On-going      PT LONG TERM GOAL #2   Title  Pt will improve BERG balance score to >/=52/56 in order to indicate decreased fall risk.    Baseline  55/56 on 2/22/1    Time  4    Period  Weeks    Status  Achieved      PT LONG TERM GOAL #3   Title  Pt will ambulate over varying outdoor surfaces x 1000' without AD at S level in order to indicate more independent community negotiation.    Time  4    Period  Weeks    Status  On-going      PT LONG TERM GOAL #4   Title  Patient will improve FGA score to at least a 21/30 in order to demo decr fall risk.    Baseline  17/30 on 02/08/19    Status  Revised            Plan - 03/06/19 1135    Clinical Impression Statement  Began assessing pt's LTGs for anticipated discharge at next visit. Pt has met LTG #2, with pt scoring a 55/56 on the BERG today. Remainder of today's session focused on dynamic balance with eyes open on BOSU, eyes closed balance on rockerboard, and hip ABD strengthening with green theraband. Pt initially fearful of BOSU exercises, but with increased reps was able to perform with more confidence with UE support. Will assess remainder of LTGs at next session     Personal Factors and Comorbidities  Comorbidity 2;Comorbidity 3+    Comorbidities  HTN, DMII, CVA    Examination-Activity Limitations  Caring for Others;Stairs;Locomotion Level;Carry    Examination-Participation Restrictions  Community Activity;Medication Management;Laundry    Stability/Clinical Decision Making  Evolving/Moderate complexity    Rehab Potential  Excellent    PT Frequency  2x / week    PT Duration  4 weeks    PT Treatment/Interventions  ADLs/Self Care Home Management;Aquatic Therapy;DME Instruction;Gait training;Stair training;Functional mobility training;Therapeutic activities;Therapeutic exercise;Balance training;Neuromuscular re-education;Cognitive remediation;Patient/family education;Orthotic Fit/Training;Passive range of motion;Vestibular    PT Next Visit Plan  check remainder of LTGs. finalize HEP. discharge    PT Home Exercise Plan  [id number]    Consulted and Agree with Plan of Care  Patient;Other (Comment)   husband/pt through interpretor      Patient will benefit from skilled  therapeutic intervention in order to improve the following deficits and impairments:  Abnormal gait, Decreased activity tolerance, Decreased balance, Decreased cognition, Decreased endurance, Decreased knowledge of precautions, Decreased mobility, Decreased strength, Decreased safety awareness, Impaired perceived functional ability, Impaired flexibility, Postural dysfunction  Visit Diagnosis: Unsteadiness on feet  Other lack of coordination  Muscle weakness (generalized)  Other abnormalities of gait and mobility     Problem List Patient Active Problem List   Diagnosis Date Noted  . DM (diabetes mellitus), type 2 ([place]) 11/15/2018  . Embolic stroke ([place]) 03/12/4067  . Urinary tract infection without hematuria   . E coli bacteremia 11/08/2018  . Cerebral embolism with cerebral infarction 11/07/2018  . Pneumonia   . Pyelonephritis   . Endotracheal tube present   . Septic shock ([place])    . Sepsis ([place]) 11/01/2018  . Acute respiratory failure ([place]) 11/01/2018  . Endophthalmitis, acute 05/13/2017  . Healthcare maintenance 09/09/2016  . DM (diabetes mellitus) type 2, uncontrolled, with ketoacidosis ([place]) 05/17/2014  . Heart murmur 12/14/2013  . Cough 07/06/2013  . Hypertriglyceridemia 07/06/2013  . Type 2 diabetes mellitus without complication, with long-term current use of insulin ([place]) 07/06/2013  . Essential hypertension, benign 12/22/2012  . Hyperglycemia 11/17/2012  . Hypertension, uncontrolled 11/17/2012    Arliss Journey, PT, DPT  03/06/2019, 11:42 AM  [facility] [address] [place], [place], [address] Phone: [phone]   Fax:  [phone]  Name: Aimee Parker MRN: [id number] Date of Birth: 15-Jan-1950

## 2019-03-07 ENCOUNTER — Encounter
Payer: Medicare Other | Attending: Physical Medicine and Rehabilitation | Admitting: Physical Medicine and Rehabilitation

## 2019-03-07 ENCOUNTER — Ambulatory Visit: Payer: Medicare Other | Attending: Nurse Practitioner | Admitting: Nurse Practitioner

## 2019-03-07 ENCOUNTER — Encounter: Payer: Self-pay | Admitting: Physical Medicine and Rehabilitation

## 2019-03-07 ENCOUNTER — Encounter: Payer: Self-pay | Admitting: Nurse Practitioner

## 2019-03-07 VITALS — BP 138/77 | HR 82 | Temp 97.8°F | Ht 62.0 in | Wt 142.0 lb

## 2019-03-07 VITALS — BP 145/74 | HR 80 | Temp 97.7°F | Ht 62.0 in | Wt 142.4 lb

## 2019-03-07 DIAGNOSIS — E781 Pure hyperglyceridemia: Secondary | ICD-10-CM

## 2019-03-07 DIAGNOSIS — I63119 Cerebral infarction due to embolism of unspecified vertebral artery: Secondary | ICD-10-CM

## 2019-03-07 DIAGNOSIS — E1165 Type 2 diabetes mellitus with hyperglycemia: Secondary | ICD-10-CM | POA: Diagnosis not present

## 2019-03-07 DIAGNOSIS — I10 Essential (primary) hypertension: Secondary | ICD-10-CM | POA: Diagnosis not present

## 2019-03-07 DIAGNOSIS — I69354 Hemiplegia and hemiparesis following cerebral infarction affecting left non-dominant side: Secondary | ICD-10-CM

## 2019-03-07 DIAGNOSIS — I480 Paroxysmal atrial fibrillation: Secondary | ICD-10-CM

## 2019-03-07 DIAGNOSIS — E559 Vitamin D deficiency, unspecified: Secondary | ICD-10-CM

## 2019-03-07 DIAGNOSIS — Z794 Long term (current) use of insulin: Secondary | ICD-10-CM

## 2019-03-07 DIAGNOSIS — F5101 Primary insomnia: Secondary | ICD-10-CM | POA: Diagnosis present

## 2019-03-07 DIAGNOSIS — Z9911 Dependence on respirator [ventilator] status: Secondary | ICD-10-CM | POA: Insufficient documentation

## 2019-03-07 DIAGNOSIS — I639 Cerebral infarction, unspecified: Secondary | ICD-10-CM | POA: Diagnosis present

## 2019-03-07 DIAGNOSIS — Z1211 Encounter for screening for malignant neoplasm of colon: Secondary | ICD-10-CM

## 2019-03-07 LAB — POCT GLYCOSYLATED HEMOGLOBIN (HGB A1C): Hemoglobin A1C: 7.3 % — AB (ref 4.0–5.6)

## 2019-03-07 LAB — GLUCOSE, POCT (MANUAL RESULT ENTRY): POC Glucose: 176 mg/dl — AB (ref 70–99)

## 2019-03-07 MED ORDER — BD PEN NEEDLE MINI U/F 31G X 5 MM MISC: 6 refills | Status: DC

## 2019-03-07 MED ORDER — APIXABAN 5 MG PO TABS: 5.0000 mg | ORAL_TABLET | Freq: Two times a day (BID) | ORAL | 0 refills | Status: DC

## 2019-03-07 MED ORDER — ASPIRIN EC 81 MG PO TBEC: 81.0000 mg | DELAYED_RELEASE_TABLET | Freq: Every day | ORAL | 6 refills | Status: AC

## 2019-03-07 MED ORDER — METOPROLOL TARTRATE 25 MG PO TABS: 25.0000 mg | ORAL_TABLET | Freq: Two times a day (BID) | ORAL | 1 refills | Status: DC

## 2019-03-07 MED ORDER — LINAGLIPTIN 5 MG PO TABS: 5.0000 mg | ORAL_TABLET | Freq: Every day | ORAL | 1 refills | Status: DC

## 2019-03-07 MED ORDER — GLUCOSE BLOOD VI STRP: ORAL_STRIP | 3 refills | Status: DC

## 2019-03-07 MED ORDER — INSULIN GLARGINE 100 UNIT/ML SOLOSTAR PEN: 10.0000 [IU] | PEN_INJECTOR | Freq: Every day | SUBCUTANEOUS | 6 refills | Status: DC

## 2019-03-07 MED ORDER — INSULIN GLARGINE 100 UNIT/ML SOLOSTAR PEN: 20.0000 [IU] | PEN_INJECTOR | Freq: Every day | SUBCUTANEOUS | 11 refills | Status: DC

## 2019-03-07 MED ORDER — ATORVASTATIN CALCIUM 40 MG PO TABS: 40.0000 mg | ORAL_TABLET | Freq: Every day | ORAL | 0 refills | Status: DC

## 2019-03-07 MED ORDER — ACCU-CHEK SOFTCLIX LANCETS MISC: 6 refills | Status: DC

## 2019-03-07 MED FILL — METOPROLOL TARTRATE 25 MG T: 25 | 90 days supply | Qty: 180 | Fill #0

## 2019-03-07 MED FILL — ACCU-CHEK AVIVA PLUS STRP: 50 days supply | Qty: 200 | Fill #0

## 2019-03-07 MED FILL — TRUEPLUS 5-BEVEL PEN NEEDLE: 31G X 5 MM | 90 days supply | Qty: 100 | Fill #0

## 2019-03-07 MED FILL — ACCU-CHEK SOFTCLIX LANCETS: 90 days supply | Qty: 200 | Fill #0

## 2019-03-07 MED FILL — LANTUS SOLOSTAR 100 UNITS/M: 100 | 90 days supply | Qty: 9 | Fill #0

## 2019-03-07 MED FILL — ATORVASTATIN CALCIUM 40 MG: 40 | 90 days supply | Qty: 90 | Fill #0

## 2019-03-07 NOTE — Progress Notes (Signed)
Subjective:    Patient ID: Aimee Parker, female    DOB: January 01, 1951, 69 y.o.   MRN: [id number]  HPI  Aimee Parker presents for follow-up of her ischemic stroke. Interpreter is present.   She has been doing very well. She has completed her outpatient therapy. She walks frequently around her home for 15 minutes at a time three times per day and has had no falls. She has been doing her home exercise program every day. She has follow up with her PCP shortly and requires refill of her insulin.   Pain Inventory Average Pain 0 Pain Right Now 0 My pain is na  In the last 24 hours, has pain interfered with the following? General activity 0 Relation with others 0 Enjoyment of life 0 What TIME of day is your pain at its worst? na Sleep (in general) Good  Pain is worse with: na Pain improves with: na Relief from Meds: na  Mobility how many minutes can you walk? 15 ability to climb steps?  no do you drive?  no  Function retired  Neuro/Psych weakness  Prior Studies Any changes since last visit?  no  Physicians involved in your care Any changes since last visit?  no   Family History  Problem Relation Age of Onset  . Hypertension Mother   . Hypertension Father    Social History   Socioeconomic History  . Marital status: Married    Spouse name: Not on file  . Number of children: Not on file  . Years of education: Not on file  . Highest education level: Not on file  Occupational History  . Not on file  Tobacco Use  . Smoking status: Never Smoker  . Smokeless tobacco: Never Used  Substance and Sexual Activity  . Alcohol use: No  . Drug use: No  . Sexual activity: Not Currently  Other Topics Concern  . Not on file  Social History Narrative  . Not on file   Social Determinants of Health   Financial Resource Strain: Low Risk   . Difficulty of Paying Living Expenses: Not very hard  Food Insecurity:   . Worried About Programme researcher, broadcasting/film/video in the Last Year: Not on  file  . Ran Out of Food in the Last Year: Not on file  Transportation Needs:   . Lack of Transportation (Medical): Not on file  . Lack of Transportation (Non-Medical): Not on file  Physical Activity:   . Days of Exercise per Week: Not on file  . Minutes of Exercise per Session: Not on file  Stress:   . Feeling of Stress : Not on file  Social Connections:   . Frequency of Communication with Friends and Family: Not on file  . Frequency of Social Gatherings with Friends and Family: Not on file  . Attends Religious Services: Not on file  . Active Member of Clubs or Organizations: Not on file  . Attends Banker Meetings: Not on file  . Marital Status: Not on file   Past Surgical History:  Procedure Laterality Date  . EYE SURGERY     Left eye surgery  . PARS PLANA VITRECTOMY Right 05/13/2017   Procedure: PARS PLANA VITRECTOMY 25 GAUGE FOR ENDOPHTHALMITIS;  Surgeon: Edmon Crape, MD;  Location: [facility] OR;  Service: Ophthalmology;  Laterality: Right;   Past Medical History:  Diagnosis Date  . Diabetes mellitus without complication (HCC)   . Hypertension   . Vitamin D deficiency  BP (!) 145/74   Pulse 80   Temp 97.7 F (36.5 C)   Ht 5\' 2"  (1.575 m)   Wt 142 lb 6.4 oz (64.6 kg)   SpO2 97%   BMI 26.05 kg/m   Opioid Risk Score:   Fall Risk Score:  `1  Depression screen PHQ 2/9  Depression screen [facility] 2/9 12/05/2018 01/17/2018 04/08/2017 09/09/2016 02/26/2016 12/19/2015 05/23/2015  Decreased Interest 0 0 0 0 2 0 0  Down, Depressed, Hopeless 0 0 0 0 0 0 0  PHQ - 2 Score 0 0 0 0 2 0 0  Altered sleeping 2 0 - - 0 - -  Tired, decreased energy 0 2 - - 1 - -  Change in appetite 0 0 - - 1 - -  Feeling bad or failure about yourself  0 0 - - 0 - -  Trouble concentrating 0 0 - - 0 - -  Moving slowly or fidgety/restless 0 0 - - 0 - -  Suicidal thoughts 0 0 - - 0 - -  PHQ-9 Score 2 2 - - 4 - -    Review of Systems  Neurological: Positive for weakness.  All other systems  reviewed and are negative.      Objective:   Physical Exam Gen: no distress, normal appearing HEENT: oral mucosa pink and moist, NCAT Cardio: Reg rate Chest: normal effort, normal rate of breathing Abd: soft, non-distended Ext: no edema Skin: intact Neuro:AOx3. Follows commands. Musculoskeletal:5/5 strength throughout with the exception of her left side. She has 5/5 EF, EE, and 5/5 HF and KE. Functional gait without device! Psych: pleasant, normal affect    Assessment & Plan:  Aimee Parker is a 69 year old woman who presents for follow-up after CVA. She is doing well with no complaints of falls, pain, depression, or constipation.  She is now sleeping very well at night.   She has completed therapy, does her HEP every day, and walks 45 minutes per day. No longer using an assistive device and with no falls.   Continue walking multiple times per day and performing daily HEP. Eventually may benefit from [facility] to improve balance.   All questions were encouraged and answered. Follow up with me in6 months to assess progress. Patient would like to continue regular follow-up and it is difficult to schedule by phone given language barrier.    Visit performed with aid of interpreter.

## 2019-03-07 NOTE — Progress Notes (Signed)
Assessment & Plan:  Aimee Parker was seen today for follow-up.  Diagnoses and all orders for this visit:  Type 2 diabetes mellitus without complication, with long-term current use of insulin (HCC) -     Glucose (CBG) -     HgB A1c -     Microalbumin/Creatinine Ratio, Urine -     glucose blood (ACCU-CHEK AVIVA) test strip; Use as instructed. Check blood glucose levels before meals and at bedtime--by sticking your finger. -     linagliptin (TRADJENTA) 5 MG TABS tablet; Take 1 tablet (5 mg total) by mouth daily. Please fill as a 90 day supply -     Accu-Chek Softclix Lancets lancets; Use as instructed. Check blood glucose levels twice per day by fingerstick. E11.65 -     Insulin Pen Needle (B-D UF III MINI PEN NEEDLES) 31G X 5 MM MISC; Use as instructed. Inject into the skin once nightly. E11.65 -     Insulin Glargine (LANTUS) 100 UNIT/ML Solostar Pen; Inject 10 Units into the skin daily at 10 pm. Please fill as a 90 day supply Continue blood sugar control as discussed in office today, low carbohydrate diet, and regular physical exercise as tolerated, 150 minutes per week (30 min each day, 5 days per week, or 50 min 3 days per week). Keep blood sugar logs with fasting goal of 90-130 mg/dl, post prandial (after you eat) less than 180.  For Hypoglycemia: BS <60 and Hyperglycemia BS >400; contact the clinic ASAP. Annual eye exams and foot exams are recommended.   Mixed hyperlipidemia -     atorvastatin (LIPITOR) 40 MG tablet; Take 1 tablet (40 mg total) by mouth daily. Please fill as a 90 day supply INSTRUCTIONS: Work on a low fat, heart healthy diet and participate in regular aerobic exercise program by working out at least 150 minutes per week; 5 days a week-30 minutes per day. Avoid red meat/beef/steak,  fried foods. junk foods, sodas, sugary drinks, unhealthy snacking, alcohol and smoking.  Drink at least 80 oz of water per day and monitor your carbohydrate intake daily.   Colon cancer screening -      Ambulatory referral to Gastroenterology  Hypertriglyceridemia -     Lipid panel INSTRUCTIONS: Work on a low fat, heart healthy diet and participate in regular aerobic exercise program by working out at least 150 minutes per week; 5 days a week-30 minutes per day. Avoid red meat/beef/steak,  fried foods. junk foods, sodas, sugary drinks, unhealthy snacking, alcohol and smoking.  Drink at least 80 oz of water per day and monitor your carbohydrate intake daily.    Vitamin D deficiency disease -     VITAMIN D 25 Hydroxy (Vit-D Deficiency, Fractures)  Essential hypertension -     metoprolol tartrate (LOPRESSOR) 25 MG tablet; Take 1 tablet (25 mg total) by mouth 2 (two) times daily. -     CBC -     CMP14+EGFR Continue all antihypertensives as prescribed.  Remember to bring in your blood pressure log with you for your follow up appointment.  DASH/Mediterranean Diets are healthier choices for HTN.   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 Please fill as a 90 day supply -     aspirin EC 81 MG tablet; Take 1 tablet (81 mg total) by mouth daily. Please fill as a  90 day supply    Patient has been counseled on age-appropriate routine health concerns for screening and prevention. These are reviewed and up-to-date. Referrals have been placed accordingly. Immunizations are up-to-date or declined.    Subjective:   Chief Complaint  Patient presents with  . Follow-up    Pt. is here for diabetes and hypertension follow up.   HPI Aimee Parker 69 y.o. female presents to office today for follow up.    She had a stroke 10-2018 (cerebral artery embolism). Required IP rehab for 7 days. She was instructed to follow up with Neurology however they attempted to contact her several times but were unable to reach her. She will need to follow up with  Neurology. I reached out to them several times today to schedule for the patient while she was here in the office however I was directed to voicemail for scheduling each time. She continues on ASA 81 mg and Eliquis 5 mg at this time. It is unclear if this will be indefinite. She will need to see Neurology to determine this. She does have  A history of PAF so likely will need long term anticoagulation. Unfortunately she has not been taking Eliquis since November. I believe there was a communication barrier in regard to her understanding to continue this medication. I did instruct her that she is to continue on this medication until evaluated by Neurology.  VRI was used to communicate directly with patient for the entire encounter including providing detailed patient instructions.    She has several medications not here with her today. I have asked her to return with all of her medications for med management with the pharmacist.   March 1st has opthalmology appointment    DM TYPE 2 Well controlled. Down from 8.0 to 7.3 Endorses medication compliance taking the following medications: Lantus 10 units nightly, Tradjenta 5 mg daily. Denies hypo or hyperglycemic symptoms. She is taking STATIN.  Lab Results  Component Value Date   HGBA1C 7.3 (A) 03/07/2019   Essential Hypertension Blood pressure is well controlled. Taking lopressor 25 mg BID as prescribed. Denies chest pain, shortness of breath, palpitations, lightheadedness, dizziness, headaches or BLE edema.  BP Readings from Last 3 Encounters:  03/07/19 138/77  03/07/19 (!) 145/74  02/08/19 (!) 123/54   Dyslipidemia LDL not at goal <70. Taking atorvastatin 40 mg daily as prescribed. Denies statin intolerance or myalgias.  Lab Results  Component Value Date   LDLCALC 97 11/07/2018     Review of Systems  Constitutional: Negative for fever, malaise/fatigue and weight loss.  HENT: Negative.  Negative for nosebleeds.   Eyes: Negative.   Negative for blurred vision, double vision and photophobia.  Respiratory: Negative.  Negative for cough and shortness of breath.   Cardiovascular: Negative.  Negative for chest pain, palpitations and leg swelling.  Gastrointestinal: Negative.  Negative for heartburn, nausea and vomiting.  Musculoskeletal: Negative.  Negative for myalgias.  Neurological: Negative.  Negative for dizziness, focal weakness, seizures and headaches.  Psychiatric/Behavioral: Negative.  Negative for suicidal ideas.    Past Medical History:  Diagnosis Date  . Diabetes mellitus without complication ([place])   . Hypertension   . Vitamin D deficiency     Past Surgical History:  Procedure Laterality Date  . EYE SURGERY     Left eye surgery  . PARS PLANA VITRECTOMY Right 05/13/2017   Procedure: PARS PLANA VITRECTOMY 25 GAUGE FOR ENDOPHTHALMITIS;  Surgeon: Hurman Horn, MD;  Location: [place];  Service: Ophthalmology;  Laterality: Right;    Family History  Problem Relation Age of Onset  . Hypertension Mother   . Hypertension Father     Social History Reviewed with no changes to be made today.   Outpatient Medications Prior to Visit  Medication Sig Dispense Refill  . Blood Glucose Monitoring Suppl (ACCU-CHEK AVIVA) device Use as instructed to check blood sugar 3 to 4 times daily. E11.9. 1 each 0  . Blood Glucose Monitoring Suppl (ACCU-CHEK GUIDE) w/Device KIT CHECK BLOOD SUGAR 3 4 TIMES DAILY    . Insulin Syringes, Disposable, U-100 0.5 ML MISC 1 application by Does not apply route daily. 100 each 0  . TRUEPLUS INSULIN SYRINGE 30G X 5/16" 0.5 ML MISC     . Accu-Chek Softclix Lancets lancets Use as instructed. Check blood glucose levels twice per day by fingerstick 200 each 3  . atorvastatin (LIPITOR) 40 MG tablet Take 1 tablet (40 mg total) by mouth daily. 30 tablet 0  . glucose blood (ACCU-CHEK AVIVA) test strip Use as instructed. Check blood glucose levels before meals and at bedtime--by sticking your finger.  200 each 3  . insulin glargine (LANTUS) 100 UNIT/ML injection Inject 0.1 mLs (10 Units total) into the skin daily. 10 mL 11  . acetaminophen (TYLENOL) 325 MG tablet Take 1-2 tablets (325-650 mg total) by mouth every 4 (four) hours as needed for mild pain. (Patient not taking: Reported on 03/07/2019)    . Vitamin D, Ergocalciferol, (DRISDOL) 1.25 MG (50000 UT) CAPS capsule Take 1 capsule (50,000 Units total) by mouth every 7 (seven) days. (Patient not taking: Reported on 03/07/2019) 12 capsule 3  . apixaban (ELIQUIS) 5 MG TABS tablet Take 1 tablet (5 mg total) by mouth 2 (two) times daily. (Patient not taking: Reported on 03/07/2019) 60 tablet 0  . aspirin EC 81 MG tablet Take 1 tablet (81 mg total) by mouth daily. (Patient not taking: Reported on 03/07/2019) 100 tablet 0  . benzonatate (TESSALON) 100 MG capsule Take 1 capsule (100 mg total) by mouth 3 (three) times daily as needed for cough. (Patient not taking: Reported on 03/07/2019) 30 capsule 0  . linagliptin (TRADJENTA) 5 MG TABS tablet Take 1 tablet (5 mg total) by mouth daily. (Patient not taking: Reported on 03/07/2019) 30 tablet 0  . Melatonin 5 MG CAPS Take 1 capsule (5 mg total) by mouth at bedtime as needed. (Patient not taking: Reported on 03/07/2019) 30 capsule 0  . metoprolol tartrate (LOPRESSOR) 25 MG tablet Take 1 tablet (25 mg total) by mouth 2 (two) times daily. (Patient not taking: Reported on 03/07/2019) 60 tablet 0  . senna (SENOKOT) 8.6 MG TABS tablet Take 1 tablet (8.6 mg total) by mouth 2 (two) times daily. (Patient not taking: Reported on 03/07/2019) 120 tablet 0   No facility-administered medications prior to visit.    No Known Allergies     Objective:    BP 138/77 (BP Location: Left Arm, Patient Position: Sitting, Cuff Size: Normal)   Pulse 82   Temp 97.8 F (36.6 C) (Temporal)   Ht _0  (1.575 m)   Wt 142 lb (64.4 kg)   SpO2 97%   BMI 25.97 kg/m  Wt Readings from Last 3 Encounters:  03/07/19 142 lb (64.4 kg)    03/07/19 142 lb 6.4 oz (64.6 kg)  01/04/19 139 lb (63 kg)    Physical Exam Vitals and nursing note reviewed.  Constitutional:      Appearance: She is well-developed.  HENT:  Head: Normocephalic and atraumatic.  Cardiovascular:     Rate and Rhythm: Normal rate and regular rhythm.     Heart sounds: Normal heart sounds. No murmur. No friction rub. No gallop.   Pulmonary:     Effort: Pulmonary effort is normal. No tachypnea or respiratory distress.     Breath sounds: Normal breath sounds. No decreased breath sounds, wheezing, rhonchi or rales.  Chest:     Chest wall: No tenderness.  Abdominal:     General: Bowel sounds are normal.     Palpations: Abdomen is soft.  Musculoskeletal:        General: Normal range of motion.     Cervical back: Normal range of motion.  Skin:    General: Skin is warm and dry.  Neurological:     Mental Status: She is alert and oriented to person, place, and time.     Coordination: Coordination normal.  Psychiatric:        Behavior: Behavior normal. Behavior is cooperative.        Thought Content: Thought content normal.        Judgment: Judgment normal.          Patient has been counseled extensively about nutrition and exercise as well as the importance of adherence with medications and regular follow-up. The patient was given clear instructions to go to ER or return to medical center if symptoms don't improve, worsen or new problems develop. The patient verbalized understanding.   Follow-up: Return in about 2 weeks (around 03/21/2019) for MED MANAGEMENT with luke.   Gildardo Pounds, FNP-BC [facility] and [place], [place]   03/07/2019, 2:11 PM

## 2019-03-08 ENCOUNTER — Other Ambulatory Visit: Payer: Self-pay

## 2019-03-08 ENCOUNTER — Encounter: Payer: Medicare Other | Admitting: Occupational Therapy

## 2019-03-08 ENCOUNTER — Ambulatory Visit: Payer: Medicare Other | Admitting: Physical Therapy

## 2019-03-08 DIAGNOSIS — R2681 Unsteadiness on feet: Secondary | ICD-10-CM

## 2019-03-08 DIAGNOSIS — R2689 Other abnormalities of gait and mobility: Secondary | ICD-10-CM

## 2019-03-08 DIAGNOSIS — R278 Other lack of coordination: Secondary | ICD-10-CM

## 2019-03-08 DIAGNOSIS — M6281 Muscle weakness (generalized): Secondary | ICD-10-CM

## 2019-03-08 NOTE — Patient Instructions (Signed)
Access Code: [email or web address]  URL: [email or web address]  Date: 03/08/2019  Prepared by: Sherlie Ban   Exercises Walking March - 3 reps - 1 sets - 2x daily - 7x weekly Side Stepping with Resistance at [employer] and Counter Support - 10 reps - 3 sets - 2x daily - 7x weekly Heel Walking with Counter Support - 3 reps - 1 sets - 3x daily - 7x weekly Tandem Walking with Counter Support - 3 reps - 1 sets - 2x daily - 7x weekly Backward Walking with Counter Support - 3 reps - 1 sets - 2x daily - 7x weekly Toe Walking with Counter Support - 3 reps - 1 sets - 2x daily - 7x weekly Walking with Head Nod - 3 sets - 2x daily - 7x weekly Walking with Head Rotation - 3 sets - 2x daily - 7x weekly Supine Bridge - 10 reps - 2 sets - 2x daily - 5x weekly Clamshell with Resistance - 10 reps - 2 sets - 1x daily - 5x weekly Romberg Stance Eyes Closed on Foam Pad - 3 sets - 30 hold - 2x daily - 5x weekly

## 2019-03-08 NOTE — Therapy (Signed)
[facility] [address] [place], [place], [address] Phone: [phone]   Fax:  [phone]  Physical Therapy Treatment/Discharge Summary/10th Visit Progress Note  Patient Details  Name: Aimee Parker MRN: [id number] Date of Birth: 1950-10-18 Referring Provider (PT): Alysia Penna, MD   Encounter Date: 03/08/2019  PT End of Session - 03/08/19 1106    Visit Number  20    Number of Visits  20    Date for PT Re-Evaluation  04/09/19   written for 4 week POC   Authorization Type  [employer] Medicare/medicaid (10th visit progress note needed)    PT Start Time  0846    PT Stop Time  0930    PT Time Calculation (min)  44 min    Equipment Utilized During Treatment  Gait belt   vietnamese interpreter service   Activity Tolerance  Patient tolerated treatment well    Behavior During Therapy  [facility] for tasks assessed/performed       Past Medical History:  Diagnosis Date  . Diabetes mellitus without complication ([place])   . Hypertension   . Vitamin D deficiency     Past Surgical History:  Procedure Laterality Date  . EYE SURGERY     Left eye surgery  . PARS PLANA VITRECTOMY Right 05/13/2017   Procedure: PARS PLANA VITRECTOMY 25 GAUGE FOR ENDOPHTHALMITIS;  Surgeon: Hurman Horn, MD;  Location: [facility];  Service: Ophthalmology;  Laterality: Right;    There were no vitals filed for this visit.  Subjective Assessment - 03/08/19 0849    Subjective  "1 day!" (in reference to last day of therapy).    Patient is accompained by:  Family member;Interpreter   [name] (pronounced)   Pertinent History  HTN, DMII    Limitations  Walking;House hold activities    Patient Stated Goals  "I want to be like I was before."    Currently in Pain?  No/denies         [facility] PT Assessment - 03/08/19 0851      Functional Gait  Assessment   Gait assessed   Yes    Gait Level Surface  Walks 20 ft in less than 5.5 sec, no assistive devices, good  speed, no evidence for imbalance, normal gait pattern, deviates no more than 6 in outside of the 12 in walkway width.    Change in Gait Speed  Able to smoothly change walking speed without loss of balance or gait deviation. Deviate no more than 6 in outside of the 12 in walkway width.    Gait with Horizontal Head Turns  Performs head turns smoothly with slight change in gait velocity (eg, minor disruption to smooth gait path), deviates 6-10 in outside 12 in walkway width, or uses an assistive device.    Gait with Vertical Head Turns  Performs head turns with no change in gait. Deviates no more than 6 in outside 12 in walkway width.    Gait and Pivot Turn  Pivot turns safely within 3 sec and stops quickly with no loss of balance.    Step Over Obstacle  Is able to step over one shoe box (4.5 in total height) without changing gait speed. No evidence of imbalance.    Gait with Narrow Base of Support  Ambulates 4-7 steps.    Gait with Eyes Closed  Walks 20 ft, slow speed, abnormal gait pattern, evidence for imbalance, deviates 10-15 in outside 12 in walkway width. Requires more than 9 sec to  ambulate 20 ft.    Ambulating Backwards  Walks 20 ft, uses assistive device, slower speed, mild gait deviations, deviates 6-10 in outside 12 in walkway width.    Steps  Alternating feet, no rail.    Total Score  23    FGA comment:  23/30                   [facility] Adult PT Treatment/Exercise - 03/08/19 0916      Ambulation/Gait   Ambulation/Gait  Yes    Ambulation/Gait Assistance  6: Modified independent (Device/Increase time);5: Supervision    Ambulation Distance (Feet)  1000 Feet    Assistive device  None    Gait Pattern  Step-through pattern    Ambulation Surface  Level;Unlevel;Outdoor;Paved;Grass;Indoor    Stairs  Yes    Stairs Assistance  5: Supervision    Stair Management Technique  No rails;Alternating pattern;Forwards          Access Code: [email or web address]  URL:  [email or web address]  Date: 03/08/2019  Prepared by: Janann August   Reviewed pt's HEP:   Exercises Walking March - 3 reps - 1 sets - 2x daily - 7x weekly Side Stepping with Resistance at [employer] and Counter Support - 10 reps - 3 sets - 2x daily - 7x weekly Heel Walking with Counter Support - 3 reps - 1 sets - 3x daily - 7x weekly Tandem Walking with Counter Support - 3 reps - 1 sets - 2x daily - 7x weekly Backward Walking with Counter Support - 3 reps - 1 sets - 2x daily - 7x weekly Toe Walking with Counter Support - 3 reps - 1 sets - 2x daily - 7x weekly Walking with Head Nod - 3 sets - 2x daily - 7x weekly Walking with Head Rotation - 3 sets - 2x daily - 7x weekly Supine Bridge - 10 reps - 2 sets - 2x daily - 5x weekly Clamshell with Resistance - 10 reps - 2 sets - 1x daily - 5x weekly Romberg Stance Eyes Closed on Foam Pad - 3 sets - 30 hold - 2x daily - 5x weekly    PT Education - 03/08/19 1106    Education Details  progress towards goals, reviewed HEP    Person(s) Educated  Patient    Methods  Explanation;Handout    Comprehension  Verbalized understanding;Returned demonstration       PT Short Term Goals - 01/16/19 1026      PT SHORT TERM GOAL #1   Title  Pt will be ind with initial HEP in order to improve balance and functional mobility.  (Target Date: 01/11/19)    Baseline  pt reports she is independent with HEP    Time  4    Period  Weeks    Status  Achieved    Target Date  01/11/19      PT SHORT TERM GOAL #2   Title  Pt will improve BERG balance score to 46/56 in order to indicate decreased fall risk.    Baseline  47/56    Status  Achieved      PT SHORT TERM GOAL #3   Title  Pt will improve gait speed to >/=2.62 ft/sec with LRAD in order to indicate safe community ambulation.    Baseline  16.22 secs = 2.02  ft/sec, 16.34 sec = 2.01 ft/sec with SPC    Status  Not Met      PT SHORT TERM GOAL #4   Title  Pt will perform  5TSS in </=11 secs  without UE support and no reliance of LEs on chair for support to indicate improved functional strength.    Baseline  11.59 seconds without UE support.    Status  Partially Met      PT SHORT TERM GOAL #5   Title  Pt will ambulate x 100' at mod I level in home without AD in order to indicate improved independence in home.    Baseline  x100' with supervision with no AD    Status  Partially Met      PT SHORT TERM GOAL #6   Title  Pt will ambulate x 500' w/ LRAD at S level over unlevel paved surfaces in order to indicate safe community ambulation.    Baseline  able to ambulate 300' with [facility] outsdoors with supervision (unable to perform full 500' due to weather)    Status  Partially Met        PT Long Term Goals - 03/08/19 0905      PT LONG TERM GOAL #1   Title  Pt will be ind with final HEP in order to indicate decreased fall risk and improved functional mobility (ALL LTGS DUE 03/08/19)    Baseline  pt will continue to benefit from ongoing revisions to HEP for strength and balance    Time  4    Period  Weeks    Status  Achieved      PT LONG TERM GOAL #2   Title  Pt will improve BERG balance score to >/=52/56 in order to indicate decreased fall risk.    Baseline  55/56 on 2/22/1    Time  4    Period  Weeks    Status  Achieved      PT LONG TERM GOAL #3   Title  Pt will ambulate over varying outdoor surfaces x 1000' without AD at S level in order to indicate more independent community negotiation.    Baseline  outdoors over paved and grass surfaces    Time  4    Period  Weeks    Status  Achieved      PT LONG TERM GOAL #4   Title  Patient will improve FGA score to at least a 21/30 in order to demo decr fall risk.    Baseline  23/30 on 03/08/19    Status  Achieved        PHYSICAL THERAPY DISCHARGE SUMMARY  Visits from Start of Care: 20  Current functional level related to goals / functional outcomes: See LTGs   Remaining deficits: Impaired balance with eyes closed, retro  gait, stepping over obstacles.   Education / Equipment: HEP   Plan: Patient agrees to discharge.  Patient goals were met. Patient is being discharged due to meeting the stated rehab goals.  ?????          Plan - 03/08/19 1109    Clinical Impression Statement  Focus of today's skilled session was assessing pt's goals for discharge. Pt has met all of her LTGs. Pt scored a 23/30 on the FGA today (previously 17/30) - able to ambulate 1,000' outdoors with mod I on paved surfaces and supervision on grass surfaces. Verbally reviewed and had pt demo corner balance exercise on HEP. Pt is very pleased with her progress and will be discharged from PT at this time.    Personal Factors and Comorbidities  Comorbidity 2;Comorbidity 3+    Comorbidities  HTN, DMII, CVA    Examination-Activity Limitations  Caring for Others;Stairs;Locomotion Level;Carry    Examination-Participation Restrictions  Community Activity;Medication Management;Laundry    Stability/Clinical Decision Making  Evolving/Moderate complexity    Rehab Potential  Excellent    PT Frequency  2x / week    PT Duration  4 weeks    PT Treatment/Interventions  ADLs/Self Care Home Management;Aquatic Therapy;DME Instruction;Gait training;Stair training;Functional mobility training;Therapeutic activities;Therapeutic exercise;Balance training;Neuromuscular re-education;Cognitive remediation;Patient/family education;Orthotic Fit/Training;Passive range of motion;Vestibular    PT Next Visit Plan  D/C    PT Home Exercise Plan  [email or web address]    Consulted and Agree with Plan of Care  Patient;Other (Comment)   husband/pt through interpretor      Patient will benefit from skilled therapeutic intervention in order to improve the following deficits and impairments:  Abnormal gait, Decreased activity tolerance, Decreased balance, Decreased cognition, Decreased endurance, Decreased knowledge of precautions, Decreased mobility, Decreased strength, Decreased safety  awareness, Impaired perceived functional ability, Impaired flexibility, Postural dysfunction  Visit Diagnosis: Unsteadiness on feet  Other lack of coordination  Muscle weakness (generalized)  Other abnormalities of gait and mobility     Problem List Patient Active Problem List   Diagnosis Date Noted  . Dependence on respirator (ventilator) status ([place]) 03/07/2019  . Hemiplegia and hemiparesis following cerebral infarction affecting left non-dominant side ([place]) 03/07/2019  . Type 2 diabetes mellitus with hyperglycemia, with long-term current use of insulin ([place]) 03/07/2019  . Paroxysmal atrial fibrillation ([place]) 03/07/2019  . DM (diabetes mellitus), type 2 ([place]) 11/15/2018  . Embolic stroke ([place]) 12/87/8676  . Urinary tract infection without hematuria   . E coli bacteremia 11/08/2018  . Cerebral embolism with cerebral infarction 11/07/2018  . Pneumonia   . Pyelonephritis   . Endotracheal tube present   . Septic shock ([place])   . Sepsis ([place]) 11/01/2018  . Acute respiratory failure ([place]) 11/01/2018  . Endophthalmitis, acute 05/13/2017  . Healthcare maintenance 09/09/2016  . DM (diabetes mellitus) type 2, uncontrolled, with ketoacidosis ([place]) 05/17/2014  . Heart murmur 12/14/2013  . Cough 07/06/2013  . Hypertriglyceridemia 07/06/2013  . Type 2 diabetes mellitus without complication, with long-term current use of insulin ([place]) 07/06/2013  . Essential hypertension, benign 12/22/2012  . Hyperglycemia 11/17/2012  . Hypertension, uncontrolled 11/17/2012    Arliss Journey, PT, DPT  03/08/2019, 11:10 AM  [facility] [address] [place], [place], [address] Phone: [phone]   Fax:  [phone]  Name: Aimee Parker MRN: [id number] Date of Birth: [birth year]

## 2019-03-09 ENCOUNTER — Encounter: Payer: Self-pay | Admitting: Neurology

## 2019-03-09 ENCOUNTER — Ambulatory Visit (INDEPENDENT_AMBULATORY_CARE_PROVIDER_SITE_OTHER): Payer: Medicare Other | Admitting: Neurology

## 2019-03-09 VITALS — BP 162/74 | HR 65 | Temp 98.3°F | Ht 61.5 in | Wt 142.0 lb

## 2019-03-09 DIAGNOSIS — I6310 Cerebral infarction due to embolism of unspecified precerebral artery: Secondary | ICD-10-CM | POA: Diagnosis not present

## 2019-03-09 DIAGNOSIS — I639 Cerebral infarction, unspecified: Secondary | ICD-10-CM

## 2019-03-09 LAB — CMP14+EGFR
ALT: 43 IU/L — ABNORMAL HIGH (ref 0–32)
AST: 33 IU/L (ref 0–40)
Albumin/Globulin Ratio: 1.6 (ref 1.2–2.2)
Albumin: 4.8 g/dL (ref 3.8–4.8)
Alkaline Phosphatase: 73 IU/L (ref 39–117)
BUN/Creatinine Ratio: 18 (ref 12–28)
BUN: 18 mg/dL (ref 8–27)
Bilirubin Total: 0.3 mg/dL (ref 0.0–1.2)
CO2: 21 mmol/L (ref 20–29)
Calcium: 10.5 mg/dL — ABNORMAL HIGH (ref 8.7–10.3)
Chloride: 101 mmol/L (ref 96–106)
Creatinine, Ser: 0.99 mg/dL (ref 0.57–1.00)
GFR calc Af Amer: 68 mL/min/{1.73_m2} (ref >59)
GFR calc non Af Amer: 59 mL/min/{1.73_m2} — ABNORMAL LOW (ref >59)
Globulin, Total: 3 g/dL (ref 1.5–4.5)
Glucose: 138 mg/dL — ABNORMAL HIGH (ref 65–99)
Potassium: 4.2 mmol/L (ref 3.5–5.2)
Sodium: 143 mmol/L (ref 134–144)
Total Protein: 7.8 g/dL (ref 6.0–8.5)

## 2019-03-09 LAB — LIPID PANEL
Chol/HDL Ratio: 2.6 ratio (ref 0.0–4.4)
Cholesterol, Total: 144 mg/dL (ref 100–199)
HDL: 55 mg/dL (ref >39)
LDL Chol Calc (NIH): 49 mg/dL (ref 0–99)
Triglycerides: 258 mg/dL — ABNORMAL HIGH (ref 0–149)
VLDL Cholesterol Cal: 40 mg/dL (ref 5–40)

## 2019-03-09 LAB — VITAMIN D 25 HYDROXY (VIT D DEFICIENCY, FRACTURES): Vit D, 25-Hydroxy: 28.2 ng/mL — ABNORMAL LOW (ref 30.0–100.0)

## 2019-03-09 LAB — CBC
Hematocrit: 38.4 % (ref 34.0–46.6)
Hemoglobin: 12.4 g/dL (ref 11.1–15.9)
MCH: 22.5 pg — ABNORMAL LOW (ref 26.6–33.0)
MCHC: 32.3 g/dL (ref 31.5–35.7)
MCV: 70 fL — ABNORMAL LOW (ref 79–97)
Platelets: 298 10*3/uL (ref 150–450)
RBC: 5.51 x10E6/uL — ABNORMAL HIGH (ref 3.77–5.28)
RDW: 14.5 % (ref 11.7–15.4)
WBC: 11.6 10*3/uL — ABNORMAL HIGH (ref 3.4–10.8)

## 2019-03-09 LAB — MICROALBUMIN / CREATININE URINE RATIO

## 2019-03-09 NOTE — Patient Instructions (Addendum)
We will refer her to Dr. Johney Frame in cardiology for loop recorder evaluation Continue medications Follow up here in 6 months   Rung nh? Atrial Fibrillation  Rung nh? l m?t lo?i nh?p tim khng ??u ho?c nhanh (lo?n nh?p). Trong rung nh?, ph?n trn cng c?a tim (tm nh?) ??p theo ki?u khng ??u. ?i?u ny khi?n tim khng th? b?m mu m?t cch bnh th??ng v hi?u qu?Marland Kitchen M?c tiu chnh c?a vi?c ?i?u tr? l ng?n khng cho hnh thnh cc c?c mu ?ng, ki?m sot nh?p tim, ho?c khi ph?c nh?p tim c?a qu v? v? nh?p bnh th??ng. N?u tnh tr?ng ny khng ???c ?i?u tr?, n c th? gy ra cc v?n ?? nghim tr?ng, nh? suy y?u c? tim (b?nh c? tim) ho?c ??t qu?Marland Kitchen Nguyn nhn g gy ra? Tnh tr?ng ny th??ng do cc tnh tr?ng b?nh l gy t?n th??ng h? th?ng ?i?n c?a tim gy ra. Cc d?u hi?u/tri?u ch?ng ny bao g?m:  Huy?t p cao (t?ng huy?t p). ?y l nguyn nhn ph? bi?n nh?t.  M?t s? v?n ?? ho?c tnh tr?ng nh?t ??nh ? tim, ch?ng h?n nh? suy tim, b?nh ??ng m?ch vnh, cc v?n ?? van tim, ho?c ph?u thu?t tim.  Ti?u ???ng.  Tuy?n gip ho?t ??ng qu m?nh (c??ng gip).  Bo ph.  B?nh th?n m?n tnh. Trong m?t s? tr??ng h?p, khng r nguyn nhn gy ra tnh tr?ng ny. ?i?u g lm t?ng nguy c?? Tnh tr?ng ny hay x?y ra h?n ?:  Ng??i gi.  Nh?ng ng??i ht thu?c.  Cc v?n ??ng vin th?c hi?n bi t?p s?c b?n.  Nh?ng ng??i c ti?n s? gia ?nh b? rung nh?.  Nam gi?i.  Nh?ng ng??i s? d?ng ma ty.  Nh?ng ng??i u?ng nhi?u r??u bia.  Nh?ng ng??i c tnh tr?ng b?nh l ? ph?i, ch?ng h?n nh? kh ph? th?ng, vim ph?i, ho?c b?nh ph?i t?c ngh?n m?n tnh (COPD).  Nh?ng ng??i b? ng?ng th? khi ng? do t?c ngh?n. C cc d?u hi?u ho?c tri?u ch?ng g? Nh?ng tri?u ch?ng c?a tnh tr?ng ny bao g?m:  C?m th?y tim ??p nhanh ho?c khng ??u.  C?m gic kh ch?u ho?c ?au ? ng?c.  Kh th?.  ??t nhin chong vng ho?c y?u.  D? dng m?t m?i trong khi t?p th? d?c ho?c ho?t ??ng.  M?t m?i.  Ng?t (x?u).  ?? m?  hi. Trong m?t s? tr??ng h?p, khng c tri?u ch?ng. Ch?n ?on tnh tr?ng ny nh? th? no? Chuyn gia ch?m [place] s?c kh?e c th? pht hi?n rung nh? khi b?t m?ch. N?u pht hi?n ra, tnh tr?ng ny c th? ???c ch?n ?on b?ng:  ?i?n tm ?? (ECG) ?? ki?m tra cc tn hi?u ?i?n c?a tim.  My theo di tim di ??ng ?? ghi l?i ho?t ??ng tim c?a quy? vi? trong vi ngy.  Siu m tim qua ng?c (TTE) ?? ch?p ?nh tim qu v?.  Th?m ch l siu m tim qua th?c qu?n (TEE) ?? ch?p ?nh g?n tim qu v? h?n.  Nghi?m php g?ng s?c ?? ki?m tra l??ng mu cung c?p c?a qu v? trong khi qu v? t?p th? d?c.  Ki?m tra hnh ?nh, ch?ng h?n nh? ch?p CT ho?c ch?p x-quang ng?c.  Xt nghi?m mu. Tnh tr?ng ny ???c ?i?u tr? nh? th? no? ?i?u tr? ty thu?c vo cc tnh tr?ng hi?n c c?a qu v? v qu v? c?m th?y th? no khi ?ang b? rung nh?. Tnh tr?ng ny c th? ???c ?  i?u tr? b?ng:  Thu?c ?? ng?n ng?a c?c mu ?ng ho?c ?i?u tr? cc v?n ?? v? nh?p tim.  S?c ?i?n chuy?n nh?p ?? ??t l?i nh?p tim.  My t?o nh?p tim ?? ?i?u ch?nh nh?p tim b?t th??ng.  C?t b? ?? lo?i b? m tim g?i ?i cc tn hi?u b?t th??ng.  ?ng ti?u nh? tri ?? bt kn khu v?c c th? hnh thnh c?c mu ?ng. Trong m?t s? tr??ng h?p, cc tnh tr?ng hi?n c s? ???c ?i?u tr?Athena Masse th? nh?ng h??ng d?n ny ? nh: Thu?c  Ch? s? d?ng thu?c khng k ??n v thu?c k ??n theo ch? d?n c?a chuyn gia ch?m [place] s?c kh?e.  Khng dng b?t k? lo?i thu?c m?i no m khng h?i  ki?n c?a chuyn gia ch?m [place] s?c kh?e.  N?u qu v? ?ang dng thu?c lm long mu: ? Hy trao ??i v?i chuyn gia ch?m [place] s?c kh?e tr??c khi qu v? dng b?t k? lo?i thu?c no c aspirin ho?c thu?c ch?ng vim khng steroid (NSAID), nh? l ibuprofen. Nh?ng lo?i thu?c ny lm t?ng nguy c? b? ch?y mu nguy hi?m. ? U?ng thu?c ?ng ch? d?n, vo cng m?t th?i ?i?m m?i ngy. ? Trnh cc ho?t ??ng c th? gy th??ng t?n ho?c b?m tm v tun theo h??ng d?n v? cch ng?n ng?a t ng. ? ?eo vng c?nh bo y t?  ho?c m?t t?m th? li?t k cc lo?i thu?c m qu v? dng. L?i s?ng      Khng s? d?ng b?t k? s?n ph?m no c nicotine ho?c thu?c l, ch?ng ha?n nh? thu?c l d?ng ht, thu?c l ?i?n t? v thu?c l d?ng nhai. N?u qu v? c?n gip ?? ?? cai thu?c, hy h?i chuyn gia ch?m [place] s?c kh?e.  ?n th?c ?n t?t cho tim. Trao ??i v?i chuyn gia dinh d??ng ?? ln k? ho?ch cho vi?c ?n u?ng ph h?p v?i qu v?.  T?p th? d?c th??ng xuyn theo ch? d?n c?a chuyn gia ch?m [place] s?c kh?e.  Khng u?ng r??u bia.  Gi?m cn n?u qu v? th?a cn.  Khng s? d?ng ma ty, bao g?m c? c?n sa. H??ng d?n chung  N?u qu v? b? ng?ng th? khi ng? do t?c ngh?n, hy x? tr tnh tr?ng c?a qu v? theo ch? d?n c?a chuyn gia ch?m [place] s?c kh?e.  Khng s? d?ng thu?c gi?m cn cho ??n khi chuyn gia ch?m [place] s?c kh?e ch?p thu?n. Vin thu?c gi?m cn c th? lm cho v?n ?? ? tim tr?m tr?ng h?n.  Tun th? t?t c? cc l?n khm theo di theo ch? d?n c?a chuyn gia ch?m [place] s?c kh?e. ?i?u ny c vai tr quan tr?ng. Hy lin l?c v?i chuyn gia ch?m [place] s?c kh?e n?u qu v?:  Nh?n th?y c thay ??i v? t?c ??, nh?p ho?c m?c ?? m?nh c?a nh?p ??p c?a tim.  ?ang dng thu?c lm long mu v qu v? th?y b?m tm nhi?u h?n.  D? m?t m?i h?n khi t?p th? d?c ho?c lm cng vi?c n?ng.  C thay ??i ??t ng?t v? cn n?ng. Yu c?u tr? gip ngay l?p t?c n?u qu v? c:   ?au ng?c, ?au b?ng, v m? hi, ho?c y?u.  Kh th?.  Tc d?ng ph? c?a thu?c lm long mu, ch?ng h?n nh? c mu trong ch?t nn, trong phn ho?c trong n??c ti?u, ho?c ch?y mu khng th? c?m ???c.  B?t k? tri?u ch?ng ??t qu? no. "  BE FAST" l cch d? dng ?? ghi nh? cc d?u hi?u chnh c?nh bo ??t qu?: ? B - Th?ng b?ng (Balance). Cc d?u hi?u l chng m?t, ??t ng?t ?i l?i kh kh?n ho?c m?t th?ng b?ng. ? E - M?t (Eyes). Cc d?u hi?u ny bao g?m nhn m? ho?c ??t ng?t thay ??i th? l?c. ? F - M?t (Face). Cc d?u hi?u l ??t ng?t y?u ho?c t ? m?t, ho?c m?t ho?c mi m?t x? xu?ng ? m?t bn. ? A -  Cnh tay (Arms). Cc d?u hi?u l y?u ho?c t ? m?t cnh tay. ?i?u ny x?y ra ??t ng?t v th??ng ? m?t bn c? th?. ? S - L?i ni (Speech). Cc d?u hi?u l ??t ng?t kh ni, ni ng?ng ho?c kh hi?u ng??i khc ni g. ? T - Th?i gian (Time). Th?i gian c?n g?i d?ch v? c?p c?u. Ghi l?i th?i gian cc tri?u ch?ng b?t ??u.  Cc d?u hi?u khc c?a ??t qu?, ch?ng h?n nh?: ? ?au ??u d? d?i, ??t ng?t khng r nguyn nhn. ? Bu?n nn ho?c nn. ? Co gi?t. Nh?ng tri?u ch?ng ny c th? l bi?u hi?n c?a m?t v?n ?? nghim tr?ng c?n c?p c?u. Khng ch? xem tri?u ch?ng c h?t khng. Hy ?i khm ngay l?p t?c. G?i cho d?ch v? c?p c?u t?i ??a ph??ng (911 ? [place] Khng t? li xe ??n b?nh vi?n. Tm t?t  Rung nh? l m?t lo?i nh?p tim khng ??u ho?c nhanh (lo?n nh?p).  Tri?u ch?ng bao g?m c?m th?y tim ??p nhanh ho?c khng ??u.  Qu v? c th? ???c cho dng thu?c ?? ng?n ng?a c?c mu ?ng ho?c ?i?u tr? cc v?n ?? v? nh?p tim.  Yu c?u tr? gip ngay l?p t?c n?u qu v? c cc d?u hi?u ho?c tri?u ch?ng ??t qu?.  C?n ph?i c tr? gip ngay l?p t?c n?u qu v? khng th? th? ho?c b? ?au ng?c ho?c t?c ng?c. Thng tin ny khng nh?m m?c ?ch thay th? cho l?i khuyn m chuyn gia ch?m [place] s?c kh?e ni v?i qu v?. Hy b?o ??m qu v? ph?i th?o lu?n b?t k? v?n ?? g m qu v? c v?i chuyn gia ch?m [place] s?c kh?e c?a qu v?. Document Revised: 07/21/2018 Document Reviewed: 07/21/2018 Elsevier Patient Education  2020 Else[employer] Inc.   Phng ng?a ??t qu? Stroke Prevention M?t s? tnh tr?ng b?nh l v hnh vi c lin quan ??n kh? n?ng b? ??t qu? cao h?n. Qu v? c th? gip phng ng?a ??t qu? b?ng cch th?c hi?n thay ??i v? dinh d??ng, l?i s?ng v thay ??i khc, bao g?m qu?n l b?t c? tnh tr?ng b?nh l no m qu v? c th? c. C th? th?c hi?n nh?ng thay ??i dinh d??ng no?   ?n th?c ?n c l?i cho s?c kh?e. Qu v? c th? lm ?i?u ny b?ng cch: ? Ch?n th?c ?n giu ch?t x? nh? tri cy t??i, rau c? v ng? c?c nguyn h?t. ? ?n t  nh?t 5 kh?u ph?n tri cy v rau c? tr? ln m?i ngy. Vo m?i b?a ?n, c? g?ng dnh m?t n?a ??a cho tri cy v rau c?. ? Ch?n th?c ph?m ch?a protein n?c, ch?ng h?n nh? mi?ng th?t n?c, th?t gia c?m b? da, c, ??u h?, cc lo?i ??u v qu? h?ch. ? ?n cc s?n ph?m s?a t bo. ? Trnh th?c ph?m ch?a nhi?u mu?i (natri). ?i?u ny c th? gip h?  p huy?t. ? [place] cc th?c ph?m ch?a ch?t bo bo ha, ch?t bo chuy?n ha v cholesterol. ?i?u ny c th? gip phng ng?a cholesterol cao. ? Trnh cc th?c ph?m lm s?n v ch? bi?n s?n.  Tun th? h??ng d?n c? th? c?a chuyn gia ch?m [place] s?c kh?e v? vi?c gi?m cn, ki?m sot huy?t p cao (t?ng huy?t p), gi?m cholesterol cao v qu?n l b?nh ti?u ???ng. Nh?ng h??ng d?n ny c th? bao g?m: ? Gi?m l??ng tiu th? calo m?i ngy. ? Gi?i h?n l??ng tiu th? natri hng ngy ??n m?c 1.500 miligram (mg). ? Ch? s? d?ng ch?t bo c l?i cho s?c kh?e ?? n?u ?n, ch?ng h?n nh? d?u  liu, d?u h?t c?i d?u ho?c d?u h??ng d??ng. ? ??m l??ng tiu th? carbohydrate hng ngy c?a qu v?. C th? th?c hi?n nh?ng thay ??i no v? l?i s?ng?  Duy tr cn n?ng c l?i cho s?c kh?e. Trao ??i v?i chuyn gia ch?m [place] s?c kh?e v? cn n?ng l t??ng c?a qu v?.  Dnh t nh?t 30 pht ho?t ??ng th? ch?t v?a ph?i trong t nh?t 5 ngy m?i tu?n. Ho?t ??ng v?a ph?i bao g?m ?i b? nhanh, ??p xe v b?i.  Khng s? d?ng b?t k? s?n ph?m no ch?a nicotine ho?c thu?c l, ch?ng ha?n nh? thu?c l d?ng ht v thu?c l ?i?n t?. N?u qu v? c?n gip ?? ?? cai thu?c, hy h?i chuyn gia ch?m [place] s?c kh?e. Trnh ti?p xc v?i khi thu?c th? ??ng c?ng c th? c tc d?ng.  Gi?i h?n l??ng r??u qu v? u?ng khng qu 1 ly m?i ngy v?i ph? n? khng mang thai v 2 ly m?i ngy v?i nam gi?i. M?t ly t??ng ???ng v?i 12 ao-x? bia, 5 ao-x? r??u vang, ho?c 1 ao-x? r??u m?nh.  Ng?ng s? d?ng b?t c? lo?i ma ty b?t h?p php no.  Trnh u?ng thu?c ng?a [place]. Trao ??i v?i chuyn gia ch?m [place] s?c kh?e v? cc nguy c? c?a vi?c dng thu?c ng?a  [place] n?u: ? Qu v? trn 35 tu?i. ? Qu v? ht thu?c l. ? Qu v? b? ?au n?a ??u. ? Qu v? t?ng c c?c mu ?ng. C th? th?c hi?n nh?ng thay ??i no khc?  Qu?n l n?ng ?? cholesterol c?a qu v?. ? p d?ng ch? ?? ?n c l?i cho s?c kh?e c vai tr quan tr?ng trong vi?c phng ng?a cholesterol cao. N?u khng th? ch? qu?n l cholesterol thng qua ch? ?? ?n, qu v? c?ng c th? c?n dng thu?c. ? Dng thu?c k ??n ?? ki?m sot cholesterol theo ch? d?n c?a chuyn gia ch?m [place] s?c kh?e.  Qu?n l b?nh ti?u ???ng c?a qu v?. ? p d?ng ch? ?? ?n c l?i cho s?c kh?e v t?p th? d?c th??ng xuyn l ph?n quan tr?ng trong qu?n l ???ng huy?t c?a qu v?. N?u khng th? ch? qu?n l ???ng huy?t thng qua ch? ?? ?n v t?p luy?n, qu v? c th? c?n dng thu?c. ? Dng b?t c? lo?i thu?c k ??n no ?? ki?m sot b?nh ti?u ???ng theo ch? d?n c?a chuyn gia ch?m [place] s?c kh?e.  Ki?m sot b?nh cao huy?t p c?a qu v?. ? ?? gi?m nguy c? ??t qu?, hy c? g?ng duy tr huy?t p c?a qu v? d??i 130/80. ? p d?ng ch? ?? ?n c l?i cho s?c kh?e v t?p th? d?c th??ng xuyn l ph?n quan tr?ng trong qu?n l huy?t  p c?a qu v?. N?u khng th? ch? qu?n l huy?t p thng qua ch? ?? ?n v t?p luy?n, qu v? c th? c?n dng thu?c. ? Dng b?t c? lo?i thu?c k ??n no ?? ki?m sot b?nh cao huy?t p theo ch? d?n c?a chuyn gia ch?m [place] s?c kh?e. ? H?i chuyn gia ch?m [place] s?c kh?e xem qu v? c c?n theo di huy?t p ? nh hay khng. ? Ki?m tra huy?t p hng n?m, ngay c? khi huy?t p c?a qu v? bnh th??ng. Huy?t p t?ng theo tu?i tc v m?t s? tnh tr?ng b?nh l.  ?nh gi cc r?i lo?n gi?c ng? (ng?ng th? khi ng?). Trao ??i v?i chuyn gia ch?m [place] s?c kh?e v? vi?c th?c hi?n ?nh gi gi?c ng? n?u qu v? ngy nhi?u ho?c bu?n ng? qu nhi?u.  Ch? s? d?ng thu?c khng k ??n v thu?c k ??n theo ch? d?n c?a chuyn gia ch?m [place] s?c kh?e. [place]ir[place] ho?c thu?c lm long mu (thu?c ch?ng k?t t?p ti?u c?u ho?c thu?c ch?ng ?ng mu) c th? ???c khuy?n ngh? ??  gi?m nguy c? hnh thnh c?c mu ?ng c th? d?n ??n ??t qu?.  ??m b?o qu?n l b?t k? tnh tr?ng b?nh l no khc m qu v? c, ch?ng h?n nh? rung nh? ho?c x? v?a ??ng m?ch. Cc d?u hi?u c?nh bo ??t qu? l g? Cc d?u hi?u c?nh bo ??t qu? c th? d? nh? l BEFAST.  B ??i di?n cho th?ng b?ng. D?u hi?u bao g?m: ? Chng m?t. ? M?t th?ng b?ng ho?c m?t ph?i h?p. ? ??t nhin kh ?i b?.  E ??i di?n cho m?t. D?u hi?u bao g?m: ? ??t ng?t thay ??i th? l?c. ? Kh nhn.  F ??i di?n cho m?t. D?u hi?u bao g?m: ? ??t ng?t th?y y?u ho?c t ? m?t. ? M?t ho?c m m?t s?p v? m?t bn.  A ??i di?n cho tay. D?u hi?u bao g?m: ? ??t nhin y?u ho?c t tay, th??ng ? m?t bn c? th?.  S ??i di?n cho kh? n?ng ni. D?u hi?u bao g?m: ? Kh ni (m?t ngn ng?). ? Kh hi?u.  T ??i di?n cho th?i gian. ? Nh?ng tri?u ch?ng ny c th? l bi?u hi?n c?a m?t v?n ?? nghim tr?ng c?n c?p c?u. Khng ch? xem tri?u ch?ng c h?t khng. Hy ?i khm ngay l?p t?c. G?i cho d?ch v? c?p c?u t?i ??a ph??ng (911 ? [place] Khng t? li xe ??n b?nh vi?n.  Cc d?u hi?u khc c?a ??t qu? c th? bao g?m: ? ?au ??u d? d?i, ??t ng?t khng r nguyn nhn. ? Bu?n nn ho?c nn. ? Co gi?t. N?i ?? tm thm thng tin ?? bi?t thm thng tin, hy truy c?p:  Hi?p h?i ??t qu? [place]?: [email or web address]  Hi?p h?i Qu?c gia v? ??t qu?: [email or web address] Tm t?t  Qu v? c th? phng ng?a ??t qu? b?ng cch ?n u?ng lnh m?nh, t?p th? d?c, khng ht thu?c, h?n ch? tiu th? r??u v qu?n l b?t c? tnh tr?ng b?nh l no m qu v? c th? c.  Khng s? d?ng b?t k? s?n ph?m no ch?a nicotine ho?c thu?c l, ch?ng ha?n nh? thu?c l d?ng ht v thu?c l ?i?n t?. N?u qu v? c?n gip ?? ?? cai thu?c, hy h?i chuyn gia ch?m [place] s?c kh?e. Trnh ti?p xc v?i khi thu?c th? ??ng c?ng c th? c tc d?ng.  Ghi nh? BEFAST cho  cc d?u hi?u c?nh bo ??t qu?[place] c?u tr? gip ngay l?p t?c n?u qu v? ho?c ng??i thn c b?t k? d?u hi?u no trong s? ny. Thng tin  ny khng nh?m m?c ?ch thay th? cho l?i khuyn m chuyn gia ch?m [place] s?c kh?e ni v?i qu v?. Hy b?o ??m qu v? ph?i th?o lu?n b?t k? v?n ?? g m qu v? c v?i chuyn gia ch?m [place] s?c kh?e c?a qu v?. Document Revised: 05/12/2016 Document Reviewed: 05/12/2016 Elsevier Patient Education  2020 [employer].

## 2019-03-09 NOTE — Progress Notes (Signed)
[id number]    Provider:  Dr Jaynee Eagles Requesting Provider: Gildardo Pounds, NP Primary Care Provider:  Gildardo Pounds, NP  CC:  Embolic stroke  HPI:  Aimee Parker is a 69 y.o. female here as requested by Gildardo Pounds, NP for embolic stroke, discharged from the hospital 11/15/2018. PMHx DM and HTN who presented to the ED with altered mentation.  She had a history of 2 to 3-days of fever, fatigue and some abdominal pain.  MRI showed multiple areas of stroke concerning for septic emboli given her fevers and positive blood cultures.  On examination she had right-sided weakness and left-sided gaze.  MRI of the brain showed multiple areas of stroke in the callosal body, splenium and left gyrus, left globus pallidus, CTA of the head and neck showed no etiology for emboli, 2D echo showed normal ejection fraction, possibly a left to right PFO however TEE was negative.  Hemoglobin A1c was uncontrolled 8.1 LDL was 97.  She was not on antiplatelets prior to admission.  She is also a cigarette smoker.  Infectious problems included pneumonia, E. coli bacteremia with positive blood cultures, fevers, acute kidney injury, acute respiratory failure intubated on ventilator support, sepsis.  TEE was negative for vegetations or PFO.  She was placed on aspirin and Plavix for 3 weeks followed by aspirin alone.  Today she is here for follow-up. She is here with an interpreter and feels she is doing well. The alter mental status is resolved. She says her weakness is better. She is walking. I had a long discussion about afib, difficult via interpreter, and the risk for afib and using a loop recorder. The stroke may have been embolic due to sepsis or she may have had afib due to sepsis prior to admission but I do feel an evaluation with Dr. Rayann Heman would be prudent. She is taking aspirin not the plavix. She has graduated from PT and feels great. No other focal neurologic deficits, associated symptoms,  inciting events or modifiable factors.  Review of Systems: Patient complains of symptoms per HPI as well as the following symptoms: stroke Pertinent negatives and positives per HPI. All others negative.   Social History   Socioeconomic History  . Marital status: Married    Spouse name: Not on file  . Number of children: Not on file  . Years of education: Not on file  . Highest education level: 9th grade  Occupational History  . Not on file  Tobacco Use  . Smoking status: Never Smoker  . Smokeless tobacco: Never Used  Substance and Sexual Activity  . Alcohol use: No  . Drug use: No  . Sexual activity: Not Currently  Other Topics Concern  . Not on file  Social History Narrative   Lives with husband and 2 children   Says she is both R & L handed   Caffeine: none   Social Determinants of Health   Financial Resource Strain: Low Risk   . Difficulty of Paying Living Expenses: Not very hard  Food Insecurity:   . Worried About Charity fundraiser in the Last Year: Not on file  . Ran Out of Food in the Last Year: Not on file  Transportation Needs:   . Lack of Transportation (Medical): Not on file  . Lack of Transportation (Non-Medical): Not on file  Physical Activity:   . Days of Exercise per Week: Not on file  . Minutes of Exercise per Session: Not on file  Stress:   .  Feeling of Stress : Not on file  Social Connections:   . Frequency of Communication with Friends and Family: Not on file  . Frequency of Social Gatherings with Friends and Family: Not on file  . Attends Religious Services: Not on file  . Active Member of Clubs or Organizations: Not on file  . Attends Archivist Meetings: Not on file  . Marital Status: Not on file  Intimate Partner Violence:   . Fear of Current or Ex-Partner: Not on file  . Emotionally Abused: Not on file  . Physically Abused: Not on file  . Sexually Abused: Not on file    Family History  Problem Relation Age of Onset  .  Hypertension Mother   . Hypertension Father   . Stroke Neg Hx     Past Medical History:  Diagnosis Date  . Diabetes mellitus without complication ([place])   . Hypertension   . Vitamin D deficiency     Patient Active Problem List   Diagnosis Date Noted  . Dependence on respirator (ventilator) status ([place]) 03/07/2019  . Hemiplegia and hemiparesis following cerebral infarction affecting left non-dominant side ([place]) 03/07/2019  . Type 2 diabetes mellitus with hyperglycemia, with long-term current use of insulin ([place]) 03/07/2019  . Paroxysmal atrial fibrillation ([place]) 03/07/2019  . DM (diabetes mellitus), type 2 ([place]) 11/15/2018  . Embolic stroke ([place]) 20/25/4270  . Urinary tract infection without hematuria   . E coli bacteremia 11/08/2018  . Cerebral embolism with cerebral infarction 11/07/2018  . Pneumonia   . Pyelonephritis   . Endotracheal tube present   . Septic shock ([place])   . Sepsis ([place]) 11/01/2018  . Acute respiratory failure ([place]) 11/01/2018  . Endophthalmitis, acute 05/13/2017  . Healthcare maintenance 09/09/2016  . DM (diabetes mellitus) type 2, uncontrolled, with ketoacidosis ([place]) 05/17/2014  . Heart murmur 12/14/2013  . Cough 07/06/2013  . Hypertriglyceridemia 07/06/2013  . Type 2 diabetes mellitus without complication, with long-term current use of insulin ([place]) 07/06/2013  . Essential hypertension, benign 12/22/2012  . Hyperglycemia 11/17/2012  . Hypertension, uncontrolled 11/17/2012    Past Surgical History:  Procedure Laterality Date  . EYE SURGERY     Left eye surgery  . PARS PLANA VITRECTOMY Right 05/13/2017   Procedure: PARS PLANA VITRECTOMY 25 GAUGE FOR ENDOPHTHALMITIS;  Surgeon: Hurman Horn, MD;  Location: [facility];  Service: Ophthalmology;  Laterality: Right;    Current Outpatient Medications  Medication Sig Dispense Refill  . Accu-Chek Softclix Lancets lancets Use as instructed. Check blood glucose levels twice per day by fingerstick. E11.65 200 each  6  . acetaminophen (TYLENOL) 325 MG tablet Take 1-2 tablets (325-650 mg total) by mouth every 4 (four) hours as needed for mild pain. (Patient not taking: Reported on 03/07/2019)    . apixaban (ELIQUIS) 5 MG TABS tablet Take 1 tablet (5 mg total) by mouth 2 (two) times daily. Please fill as a 90 day supply 180 tablet 0  . aspirin EC 81 MG tablet Take 1 tablet (81 mg total) by mouth daily. Please fill as a 90 day supply 90 tablet 6  . atorvastatin (LIPITOR) 40 MG tablet Take 1 tablet (40 mg total) by mouth daily. Please fill as a 90 day supply 90 tablet 0  . Blood Glucose Monitoring Suppl (ACCU-CHEK AVIVA) device Use as instructed to check blood sugar 3 to 4 times daily. E11.9. 1 each 0  . Blood Glucose Monitoring Suppl (ACCU-CHEK GUIDE) w/Device KIT CHECK BLOOD SUGAR 3 4 TIMES DAILY    .  glucose blood (ACCU-CHEK AVIVA) test strip Use as instructed. Check blood glucose levels before meals and at bedtime--by sticking your finger. 200 each 3  . Insulin Glargine (LANTUS) 100 UNIT/ML Solostar Pen Inject 10 Units into the skin daily at 10 pm. Please fill as a 90 day supply 10 mL 6  . Insulin Pen Needle (B-D UF III MINI PEN NEEDLES) 31G X 5 MM MISC Use as instructed. Inject into the skin once nightly. E11.65 200 each 6  . Insulin Syringes, Disposable, U-100 0.5 ML MISC 1 application by Does not apply route daily. 100 each 0  . linagliptin (TRADJENTA) 5 MG TABS tablet Take 1 tablet (5 mg total) by mouth daily. Please fill as a 90 day supply 90 tablet 1  . metoprolol tartrate (LOPRESSOR) 25 MG tablet Take 1 tablet (25 mg total) by mouth 2 (two) times daily. 180 tablet 1  . TRUEPLUS INSULIN SYRINGE 30G X 5/16" 0.5 ML MISC     . Vitamin D, Ergocalciferol, (DRISDOL) 1.25 MG (50000 UT) CAPS capsule Take 1 capsule (50,000 Units total) by mouth every 7 (seven) days. (Patient not taking: Reported on 03/07/2019) 12 capsule 3   No current facility-administered medications for this visit.    Allergies as of 03/09/2019   . (No Known Allergies)    Vitals: BP (!) 162/74 (BP Location: Right Arm, Patient Position: Sitting)   Pulse 65   Temp 98.3 F (36.8 C) Comment: taken at front  Ht 5' 1.5" (1.562 m)   Wt 142 lb (64.4 kg)   BMI 26.40 kg/m  Last Weight:  Wt Readings from Last 1 Encounters:  03/09/19 142 lb (64.4 kg)   Last Height:   Ht Readings from Last 1 Encounters:  03/09/19 5' 1.5" (1.562 m)     Physical exam: Exam: Gen: NAD, conversant, well nourised, well groomed                     CV: RRR, no MRG. No Carotid Bruits. No peripheral edema, warm, nontender Eyes: Conjunctivae clear without exudates or hemorrhage  Neuro: Detailed Neurologic Exam  Speech:    Speech is normal; fluent and spontaneous with normal comprehension.  Cognition:    The patient is oriented to person, place, and time;     recent and remote memory intact;     language fluent;     normal attention, concentration,     fund of knowledge Cranial Nerves:    The pupils are equal, round, and reactive to light. Attempted fundoscopy could not visualize. Visual fields are full to threat. Extraocular movements are intact. Trigeminal sensation is intact and the muscles of mastication are normal. The face is symmetric. The palate elevates in the midline. Hearing intact. Voice is normal. Shoulder shrug is normal. The tongue has normal motion without fasciculations.   Coordination:    Normal finger to nose  Gait:   Normal native gait  Motor Observation:    No asymmetry, no atrophy, and no involuntary movements noted. Tone:    Normal muscle tone.    Posture:    Posture is normal. normal erect    Strength: Mild prox right-sided weakness otherwise strength is V/V in the upper and lower limbs.      Sensation: intact to LT     Reflex Exam:  DTR's:    Deep tendon reflexes in the upper and lower extremities are normal bilaterally.   Toes:    The toes are downgoing bilaterally.   Clonus:  Clonus is absent.     Assessment/Plan:  69 y.o. female here as requested by Gildardo Pounds, NP for embolic stroke, discharged from the hospital 11/15/2018. PMHx DM and HTN who presented to the ED with altered mentation.  She had a history of 2 to 3-days of fever, fatigue and some abdominal pain.  MRI showed multiple areas of stroke concerning for septic emboli given her fevers and positive blood cultures.  On examination she had right-sided weakness and left-sided gaze.  MRI of the brain showed multiple areas of stroke in the callosal body, splenium and left gyrus, left globus pallidus, CTA of the head and neck showed no etiology for emboli, 2D echo showed normal ejection fraction, possibly a left to right PFO however TEE was negative for PFO or thrombus.  Hemoglobin A1c was uncontrolled 8.1 LDL was 97.  She was not on antiplatelets prior to admission.  She is also a cigarette smoker.  Infectious problems included pneumonia, E. coli bacteremia with positive blood cultures, fevers, acute kidney injury, acute respiratory failure intubated on ventilator support, sepsis.  She was given dual antiplatelet therapy for 3 weeks and then aspirin alone.  I had a long discussion about possible afib and the risk for afib and using a loop recorder. The stroke may have been embolic due to sepsis or she may have had afib due to sepsis prior to admission but I do feel an evaluation with Dr. Rayann Heman would be prudent for loop recorder implant.  I had a long d/w patient about her recent stroke, risk for recurrent stroke/TIAs, personally independently reviewed imaging studies and stroke evaluation results and answered questions.Continue ASA for secondary stroke prevention and maintain strict control of hypertension with blood pressure goal below 130/90, diabetes with hemoglobin A1c goal below 6.5% and lipids with LDL cholesterol goal below 70 mg/dL. I also advised the patient to eat a healthy diet with plenty of whole grains, cereals, fruits and  vegetables, exercise regularly and maintain ideal body weight .Followup in the future with me in 2 months or call earlier if necessary.     Orders Placed This Encounter  Procedures  . Ambulatory referral to Cardiac Electrophysiology     Cc: Gildardo Pounds, NP   .  Sarina Ill, MD  [facility] [address] [place], [place] [address]  Phone [phone] Fax [phone]

## 2019-03-10 ENCOUNTER — Other Ambulatory Visit: Payer: Self-pay | Admitting: Nurse Practitioner

## 2019-03-10 DIAGNOSIS — D72829 Elevated white blood cell count, unspecified: Secondary | ICD-10-CM

## 2019-03-13 DIAGNOSIS — H11042 Peripheral pterygium, stationary, left eye: Secondary | ICD-10-CM | POA: Diagnosis not present

## 2019-03-13 DIAGNOSIS — H179 Unspecified corneal scar and opacity: Secondary | ICD-10-CM | POA: Diagnosis not present

## 2019-03-13 DIAGNOSIS — Z961 Presence of intraocular lens: Secondary | ICD-10-CM | POA: Diagnosis not present

## 2019-03-14 ENCOUNTER — Ambulatory Visit: Payer: Medicare Other | Admitting: Neurology

## 2019-03-21 ENCOUNTER — Ambulatory Visit: Payer: Medicare Other | Attending: Nurse Practitioner | Admitting: Pharmacist

## 2019-03-21 ENCOUNTER — Other Ambulatory Visit: Payer: Self-pay

## 2019-03-21 ENCOUNTER — Ambulatory Visit: Payer: Medicare Other | Admitting: Nurse Practitioner

## 2019-03-21 VITALS — BP 191/73

## 2019-03-21 DIAGNOSIS — Z79899 Other long term (current) drug therapy: Secondary | ICD-10-CM

## 2019-03-21 DIAGNOSIS — E559 Vitamin D deficiency, unspecified: Secondary | ICD-10-CM

## 2019-03-21 MED ORDER — VITAMIN D (ERGOCALCIFEROL) 1.25 MG (50000 UNIT) PO CAPS: 50000.0000 [IU] | ORAL_CAPSULE | ORAL | 3 refills | Status: DC

## 2019-03-21 MED ORDER — LOSARTAN POTASSIUM 50 MG PO TABS: 50.0000 mg | ORAL_TABLET | Freq: Every day | ORAL | 2 refills | Status: DC

## 2019-03-21 MED FILL — VIT D2 1.25 MG (50,000 UNIT: 1.25 MG | 84 days supply | Qty: 12 | Fill #0

## 2019-03-21 MED FILL — LOSARTAN POTASSIUM 50 MG TA: 50 | 90 days supply | Qty: 90 | Fill #0

## 2019-03-21 NOTE — Progress Notes (Signed)
S:  Patient presents to clinical pharmacy today for medication reconciliation.   Medication Adherence Questionnaire (A score of 2 or more points indicates risk for nonadherence)  Do you know what each of your medicines is for? Yes (1 point if no)  Do you ever have trouble remembering to take your medicine? No (2 points if yes)  Do you think that any of your medicines is not helping you? Yes (1 point if yes)  Do you have any physical problems such as vision loss that keep you from taking your medicines as prescribed? No (2 points if yes)  Do you think any of your medicine is causing a side effect? No (1 point if yes)  Do you know the names of ALL of your medicines? Yes (1 point if no)  Do you think that you need ALL of your medicines? Yes (1 point if no)  In the past 6 months, have you missed getting a refill or a new prescription filled on time?  No (1 point if yes)  How often do you miss taking a dose of medicine?  Never (0 points)  TOTAL SCORE 1    O:   Home BP readings: SBP - 150s-160s     Vitals:   03/21/19 1116  BP: (!) 191/73    HPI:  Patient has no known adherence challenges. Patient reports adherence with Apixaban 5 mg BID, Atorvastatin 40 mg daily, Lantus 10 units at bedtime, and Linagliptin 5 mg daily with no side-effects. However, patient is only taking Metoprolol Tartrate 25 mg once daily instead of twice daily as prescribed. Patient reports not taking aspirin 81 mg and vitamin D. Patient reports that metoprolol is "not helping" her blood pressure and requested another med to control her BP. Patients home SBP reading ranges from 150s-160s and clinic BP was elevated at 191/73 mmHg. Patient denies dizziness, headaches, visual changes and chest pain. Of note, at last neurology visit on 03/09/19, patients BP was elevated 162/74.   Assessment/Plan: 1. Hypertension - longstanding currently uncontrolled, 191/73 mmHg on current medications - Metoprolol Tartrate 25 mg BID.  BP Goal = <130/80 mmHg. Pharmacy consulted patient's PCP, Bertram Denver, and agreed for additional BP management. Instructed patient to check home BP at least 30 mins after taking BP meds and bring log to next follow-up visit.  -Started Losartan 50 mg daily -Instructed patient to take Metoprolol Tartrate 25 mg twice daily  -Will check labs at 2-week follow-up visit -Counseled on lifestyle modifications for blood pressure control including reduced dietary sodium, increased exercise, adequate sleep  2. ASCVD risk - secondary prevention in patient with diabetes, Cerebral embolic stroke in 10/2018. Last LDL is controlled.  High intensity statin indicated. Aspirin is indicated.  -Started aspirin 81 mg  -Continued Atorvastatin 40 mg.   3. Vitamin D deficiency -Sent refills for Vitamin D 50,000 units weekly  Results reviewed and written information provided. Total time in face-to-face counseling 20 minutes.   F/U Clinic Visit in 2 weeks.    Patient seen with:  Fabio Neighbors, PharmD PGY1 Ambulatory Care Resident [facility]  Butch Penny, PharmD, CPP Clinical Pharmacist [facility] [phone]

## 2019-03-21 NOTE — Progress Notes (Deleted)
S:  Patient presents to clinical pharmacy today for medication reconciliation.   Medication Adherence Questionnaire (A score of 2 or more points indicates risk for nonadherence)  Do you know what each of your medicines is for? *** (1 point if no)  Do you ever have trouble remembering to take your medicine? *** (2 points if yes)  Do you ever not take a medicine because you feel you do not need it?  *** (1 point if yes)  Do you think that any of your medicines is not helping you? *** (1 point if yes)  Do you have any physical problems such as vision loss that keep you from taking your medicines as prescribed?  *** (2 points if yes)  Do you think any of your medicine is causing a side effect?  *** (1 point if yes)  Do you know the names of ALL of your medicines? *** (1 point if no)  Do you think that you need ALL of your medicines? *** (1 point if no)  In the past 6 months, have you missed getting a refill or a new prescription filled on time?  *** (1 point if yes)  How often do you miss taking a dose of medicine?  0 Never (0 points), 1 or 2 times a month (0 points), 1 time a week (2 points), 2 or more times a week (2 points).   TOTAL SCORE ***    O:  Vitals:   03/21/19 1116  BP: (!) 191/73    Patient has {MISC; ADHERENCE:18335}. Barriers include: ***lack of knowledge, forgetfulness, lack of belief in necessity of treatment, concern for more harm than good, physical barriers such as vision loss, cost.  Patient seen with:  Fabio Neighbors, PharmD PGY1 Ambulatory Care Resident [facility]  Butch Penny, PharmD, CPP Clinical Pharmacist [facility] [phone]

## 2019-03-22 ENCOUNTER — Telehealth: Payer: Self-pay | Admitting: Nurse Practitioner

## 2019-03-22 NOTE — Telephone Encounter (Signed)
Patient requested for a call regarding Covid19 shot questions she has. Please follow up at your earliest convenience.

## 2019-03-27 NOTE — Telephone Encounter (Signed)
Spoke to son in Social worker. He said the patient got the covid19 vaccine shot.  Pt. Was concerning where to get it, and she already got it.

## 2019-04-04 ENCOUNTER — Other Ambulatory Visit: Payer: Self-pay

## 2019-04-04 ENCOUNTER — Ambulatory Visit: Payer: Medicare Other | Attending: Nurse Practitioner | Admitting: Pharmacist

## 2019-04-04 VITALS — BP 182/100

## 2019-04-04 DIAGNOSIS — Z0131 Encounter for examination of blood pressure with abnormal findings: Secondary | ICD-10-CM

## 2019-04-04 DIAGNOSIS — I10 Essential (primary) hypertension: Secondary | ICD-10-CM

## 2019-04-04 MED ORDER — LOSARTAN POTASSIUM-HCTZ 100-25 MG PO TABS: 1.0000 | ORAL_TABLET | Freq: Every day | ORAL | 2 refills | Status: DC

## 2019-04-04 MED FILL — LOSARTAN-HCTZ 100-25 MG TAB: 100-25 | 90 days supply | Qty: 90 | Fill #0

## 2019-04-04 NOTE — Progress Notes (Deleted)
   S:    Patient arrives ***.    Presents to the clinic for hypertension evaluation, counseling, and management.  Patient was referred and last seen by Primary Care Provider on 03/07/19. We saw her on 03/21/2019 and started her on losartan.    Patient {Actions; denies-reports:120008} adherence with medications.  Current BP Medications include:  Losartan 50 mg daily, metoprolol 25 mg BID  Antihypertensives tried in the past include: ***  Dietary habits include: *** Exercise habits include:*** Family / Social history: ***   O:  There were no vitals filed for this visit.  Home BP readings: ***  Last 3 Office BP readings: BP Readings from Last 3 Encounters:  03/21/19 (!) 191/73  03/09/19 (!) 162/74  03/07/19 138/77    BMET    Component Value Date/Time   NA 143 03/07/2019 1048   K 4.2 03/07/2019 1048   CL 101 03/07/2019 1048   CO2 21 03/07/2019 1048   GLUCOSE 138 (H) 03/07/2019 1048   GLUCOSE 123 (H) 11/21/2018 0615   BUN 18 03/07/2019 1048   CREATININE 0.99 03/07/2019 1048   CREATININE 0.88 11/28/2015 1223   CALCIUM 10.5 (H) 03/07/2019 1048   GFRNONAA 59 (L) 03/07/2019 1048   GFRNONAA 69 11/28/2015 1223   GFRAA 68 03/07/2019 1048   GFRAA 80 11/28/2015 1223    Renal function: CrCl cannot be calculated (Patient's most recent lab result is older than the maximum 21 days allowed.).  Clinical ASCVD: {YES/NO:21197} The ASCVD Risk score Denman George DC Jr., et al., 2013) failed to calculate for the following reasons:   The patient has a prior MI or stroke diagnosis   A/P: Hypertension longstanding/newly diagnosed currently *** on current medications. BP Goal = *** mmHg. Patient medications adherence ***.  -{Meds adjust:18428} ***.  -F/u labs ordered - *** -Counseled on lifestyle modifications for blood pressure control including reduced dietary sodium, increased exercise, adequate sleep  Results reviewed and written information provided.   Total time in face-to-face counseling  *** minutes.   F/U Clinic Visit in ***.  Patient seen with ***

## 2019-04-04 NOTE — Progress Notes (Signed)
   S:    Patient arrives in good spirits. Presents to the clinic for hypertension evaluation, counseling, and management.  Patient was referred and last seen by Primary Care Provider on 03/07/19. We saw her on 03/21/2019 and started her on losartan.    Patient reports adherence with medications. Patient reports blood pressures are still high at home, ranging from 170-200s/80-90s. Today, clinic BP elevated at 182/100 mmHg. Patient denies dizziness, headaches, visual changes and chest pain.  Current BP Medications include:  Losartan 50 mg daily, metoprolol 25 mg BID  Family / Social history:  -FHx: HTN (mother, father) -Tobacco use: never smoker, denies smokeless tobacco -Alcohol use: denies  ASCVD risk factors include: HTN, CVA, Afib, DM  O:   Home BP readings: 197/83, 189/84, 214/83, 192/91, 176/82  Vitals:   04/04/19 1031  BP: (!) 182/100    Last 3 Office BP readings: BP Readings from Last 3 Encounters:  04/04/19 (!) 182/100  03/21/19 (!) 191/73  03/09/19 (!) 162/74    BMET    Component Value Date/Time   NA 143 03/07/2019 1048   K 4.2 03/07/2019 1048   CL 101 03/07/2019 1048   CO2 21 03/07/2019 1048   GLUCOSE 138 (H) 03/07/2019 1048   GLUCOSE 123 (H) 11/21/2018 0615   BUN 18 03/07/2019 1048   CREATININE 0.99 03/07/2019 1048   CREATININE 0.88 11/28/2015 1223   CALCIUM 10.5 (H) 03/07/2019 1048   GFRNONAA 59 (L) 03/07/2019 1048   GFRNONAA 69 11/28/2015 1223   GFRAA 68 03/07/2019 1048   GFRAA 80 11/28/2015 1223    Renal function: CrCl cannot be calculated (Patient's most recent lab result is older than the maximum 21 days allowed.).  Clinical ASCVD: Yes  The ASCVD Risk score Denman George DC Jr., et al., 2013) failed to calculate for the following reasons:   The patient has a prior MI or stroke diagnosis   A/P: Hypertension currently uncontrolled on current medications. BP Goal = <130/80 mmHg. Patient reports adherence with HTN meds. Blood pressure remains extremely  elevated despite adherence to metoprolol and losartan. Will order BMET to evaluate kidney function and potassium.  -Discontinued Losartan 50 mg daily -Started Losartan-HCTZ 100-25 mg daily -F/u labs ordered - BMET -Counseled on lifestyle modifications for blood pressure control including reduced dietary sodium, increased exercise, adequate sleep  Results reviewed and written information provided.  Total time in face-to-face counseling 20 minutes.   F/U Clinic Visit in 2 weeks.  Patient seen with:  Fabio Neighbors, PharmD PGY1 Ambulatory Care Resident [facility]  Butch Penny, PharmD, CPP Clinical Pharmacist [facility] [phone]

## 2019-04-05 LAB — BASIC METABOLIC PANEL
BUN/Creatinine Ratio: 16 (ref 12–28)
BUN: 14 mg/dL (ref 8–27)
CO2: 22 mmol/L (ref 20–29)
Calcium: 10.1 mg/dL (ref 8.7–10.3)
Chloride: 105 mmol/L (ref 96–106)
Creatinine, Ser: 0.89 mg/dL (ref 0.57–1.00)
GFR calc Af Amer: 76 mL/min/{1.73_m2} (ref >59)
GFR calc non Af Amer: 66 mL/min/{1.73_m2} (ref >59)
Glucose: 178 mg/dL — ABNORMAL HIGH (ref 65–99)
Potassium: 4.7 mmol/L (ref 3.5–5.2)
Sodium: 143 mmol/L (ref 134–144)

## 2019-04-06 ENCOUNTER — Telehealth: Payer: Self-pay

## 2019-04-06 NOTE — Telephone Encounter (Signed)
Patient was called and a voicemail was left informing patient to return phone call for lab results. 

## 2019-04-06 NOTE — Telephone Encounter (Signed)
-----   Message from Enobong Newlin, MD sent at 04/05/2019 12:17 PM EDT ----- Please inform her labs are stable. 

## 2019-04-10 NOTE — Telephone Encounter (Signed)
Patient name and DOB has been verified Patient was informed of lab results. Patient had no questions.  

## 2019-04-10 NOTE — Telephone Encounter (Signed)
-----   Message from Hoy Register, MD sent at 04/05/2019 12:17 PM EDT ----- Please inform her labs are stable.

## 2019-04-18 ENCOUNTER — Encounter: Payer: Self-pay | Admitting: Pharmacist

## 2019-04-18 ENCOUNTER — Other Ambulatory Visit: Payer: Self-pay

## 2019-04-18 ENCOUNTER — Ambulatory Visit: Payer: Medicare Other | Attending: Nurse Practitioner | Admitting: Pharmacist

## 2019-04-18 DIAGNOSIS — I10 Essential (primary) hypertension: Secondary | ICD-10-CM

## 2019-04-18 DIAGNOSIS — Z0131 Encounter for examination of blood pressure with abnormal findings: Secondary | ICD-10-CM

## 2019-04-18 DIAGNOSIS — Z794 Long term (current) use of insulin: Secondary | ICD-10-CM

## 2019-04-18 DIAGNOSIS — E1165 Type 2 diabetes mellitus with hyperglycemia: Secondary | ICD-10-CM

## 2019-04-18 MED ORDER — BD PEN NEEDLE MINI U/F 31G X 5 MM MISC: 6 refills | Status: DC

## 2019-04-18 MED ORDER — AMLODIPINE BESYLATE 5 MG PO TABS: 5.0000 mg | ORAL_TABLET | Freq: Every day | ORAL | 2 refills | Status: DC

## 2019-04-18 MED FILL — AMLODIPINE BESYLATE 5 MG TA: 5 | 90 days supply | Qty: 90 | Fill #0

## 2019-04-18 NOTE — Progress Notes (Signed)
   S:    Patient arrives in good spirits. Presents to the clinic for hypertension evaluation, counseling, and management.  Patient was referred and last seen by Primary Care Provider on 03/07/19. We saw her on 03/21/2019 and started her on losartan. On follow-up 04/04/2019, we changed to losartan-HCTZ 100-25 mg daily.   Patient reports adherence with medications. Patient denies dizziness, headaches, visual changes and chest pain.  Current BP Medications include:  Losartan-HCTZ 100-25 mg daily, metoprolol 25 mg BID  Family / Social history:  -FHx: HTN (mother, father) -Tobacco use: never smoker, denies smokeless tobacco -Alcohol use: denies  ASCVD risk factors include: HTN, CVA, Afib, DM  O:   Home BP readings: 197/83, 189/84, 214/83, 192/91, 176/82  Vitals:   04/18/19 1025  BP: (!) 192/84  Pulse: (!) 56    Last 3 Office BP readings: BP Readings from Last 3 Encounters:  04/18/19 (!) 192/84  04/04/19 (!) 182/100  03/21/19 (!) 191/73    BMET    Component Value Date/Time   NA 143 04/04/2019 1046   K 4.7 04/04/2019 1046   CL 105 04/04/2019 1046   CO2 22 04/04/2019 1046   GLUCOSE 178 (H) 04/04/2019 1046   GLUCOSE 123 (H) 11/21/2018 0615   BUN 14 04/04/2019 1046   CREATININE 0.89 04/04/2019 1046   CREATININE 0.88 11/28/2015 1223   CALCIUM 10.1 04/04/2019 1046   GFRNONAA 66 04/04/2019 1046   GFRNONAA 69 11/28/2015 1223   GFRAA 76 04/04/2019 1046   GFRAA 80 11/28/2015 1223    Renal function: CrCl cannot be calculated (Unknown ideal weight.).  Clinical ASCVD: Yes  The ASCVD Risk score Denman George DC Jr., et al., 2013) failed to calculate for the following reasons:   The patient has a prior MI or stroke diagnosis   A/P: Hypertension currently uncontrolled on current medications. BP Goal = <130/80 mmHg. Patient reports adherence with HTN meds. Blood pressure remains extremely elevated despite adherence.   On review of her charts, patient was previously on lisinopril and  amlodipine before her hospitalization in Nov, 2020. These were held and d/c'd after pt was found to be normotensive during her hospital stay. Will re-start amlodipine.     -Continue Losartan-HCTZ 100-25 mg daily -Start amlodipine 5 mg daily -Counseled on lifestyle modifications for blood pressure control including reduced dietary sodium, increased exercise, adequate sleep  Results reviewed and written information provided.  Total time in face-to-face counseling 20 minutes.   F/U Clinic Visit in 1 month.  Butch Penny, PharmD, CPP Clinical Pharmacist [facility] & [facility] [phone]

## 2019-04-20 HISTORY — PX: OTHER SURGICAL HISTORY: SHX169

## 2019-04-21 ENCOUNTER — Other Ambulatory Visit: Payer: Self-pay

## 2019-04-21 ENCOUNTER — Encounter: Payer: Self-pay | Admitting: Internal Medicine

## 2019-04-21 ENCOUNTER — Ambulatory Visit (INDEPENDENT_AMBULATORY_CARE_PROVIDER_SITE_OTHER): Payer: Medicare Other | Admitting: Internal Medicine

## 2019-04-21 DIAGNOSIS — I639 Cerebral infarction, unspecified: Secondary | ICD-10-CM | POA: Diagnosis not present

## 2019-04-21 NOTE — Progress Notes (Signed)
Electrophysiology Office Note   Date:  04/21/2019   ID:  Galaxy, Borden [birth year], MRN [id number]  PCP:  Gildardo Pounds, NP    Primary Electrophysiologist: Thompson Grayer, MD    CC: stroke   History of Present Illness: Aimee Parker is a 69 y.o. female who presents today for electrophysiology evaluation.   The patient presents for EP consultation on referral by Dr Jaynee Eagles for cryptogenic stroke evaluation.  She previously has had embolic findings on MRI.  Given prior ecoli bacteremia, there have been discussions by neurology of potentially septic source.  There is also concern for afib as a possible cause.  Of note, she has had prior TEE which did not reveal vegetation or TEE. Prior monitoring has not revealed any afib. She did have an ekg 11/09/2018 which revealed sinus with frequent atrial ectopy/ disorganized atrial activity.  I do not see clear afib.  Today, she denies symptoms of palpitations, chest pain, shortness of breath,   syncope, bleeding, or neurologic sequela. The patient is tolerating medications without difficulties and is otherwise without complaint today.   History obtained with use of a translator today.   Past Medical History:  Diagnosis Date  . Diabetes mellitus without complication ([place])   . Hypertension   . Vitamin D deficiency    Past Surgical History:  Procedure Laterality Date  . EYE SURGERY     Left eye surgery  . PARS PLANA VITRECTOMY Right 05/13/2017   Procedure: PARS PLANA VITRECTOMY 25 GAUGE FOR ENDOPHTHALMITIS;  Surgeon: Hurman Horn, MD;  Location: [facility];  Service: Ophthalmology;  Laterality: Right;     Current Outpatient Medications  Medication Sig Dispense Refill  . Accu-Chek Softclix Lancets lancets Use as instructed. Check blood glucose levels twice per day by fingerstick. E11.65 200 each 6  . acetaminophen (TYLENOL) 325 MG tablet Take 1-2 tablets (325-650 mg total) by mouth every 4 (four) hours as needed for mild pain.    Marland Kitchen  amLODipine (NORVASC) 5 MG tablet Take 1 tablet (5 mg total) by mouth daily. 30 tablet 2  . apixaban (ELIQUIS) 5 MG TABS tablet Take 1 tablet (5 mg total) by mouth 2 (two) times daily. Please fill as a 90 day supply 180 tablet 0  . aspirin EC 81 MG tablet Take 1 tablet (81 mg total) by mouth daily. Please fill as a 90 day supply 90 tablet 6  . atorvastatin (LIPITOR) 40 MG tablet Take 1 tablet (40 mg total) by mouth daily. Please fill as a 90 day supply 90 tablet 0  . Blood Glucose Monitoring Suppl (ACCU-CHEK AVIVA) device Use as instructed to check blood sugar 3 to 4 times daily. E11.9. 1 each 0  . Blood Glucose Monitoring Suppl (ACCU-CHEK GUIDE) w/Device KIT CHECK BLOOD SUGAR 3 4 TIMES DAILY    . glucose blood (ACCU-CHEK AVIVA) test strip Use as instructed. Check blood glucose levels before meals and at bedtime--by sticking your finger. 200 each 3  . Insulin Glargine (LANTUS) 100 UNIT/ML Solostar Pen Inject 10 Units into the skin daily at 10 pm. Please fill as a 90 day supply 10 mL 6  . Insulin Pen Needle (B-D UF III MINI PEN NEEDLES) 31G X 5 MM MISC Use as instructed. Inject into the skin once nightly. E11.65 200 each 6  . Insulin Syringes, Disposable, U-100 0.5 ML MISC 1 application by Does not apply route daily. 100 each 0  . linagliptin (TRADJENTA) 5 MG TABS tablet Take 1 tablet (5  mg total) by mouth daily. Please fill as a 90 day supply 90 tablet 1  . losartan-hydrochlorothiazide (HYZAAR) 100-25 MG tablet Take 1 tablet by mouth daily. 30 tablet 2  . metoprolol tartrate (LOPRESSOR) 25 MG tablet Take 1 tablet (25 mg total) by mouth 2 (two) times daily. 180 tablet 1  . TRUEPLUS INSULIN SYRINGE 30G X 5/16" 0.5 ML MISC     . Vitamin D, Ergocalciferol, (DRISDOL) 1.25 MG (50000 UNIT) CAPS capsule Take 1 capsule (50,000 Units total) by mouth every 7 (seven) days. 12 capsule 3   No current facility-administered medications for this visit.    Allergies:   Patient has no known allergies.   Social  History:  The patient  reports that she has never smoked. She has never used smokeless tobacco. She reports that she does not drink alcohol or use drugs.   Family History:  The patient's  family history includes Hypertension in her father and mother.    ROS:  Please see the history of present illness.   All other systems are personally reviewed and negative.    PHYSICAL EXAM: VS:  BP (!) 150/76   Pulse 78   Ht 5' 1"  (1.549 m)   Wt 140 lb (63.5 kg)   SpO2 93%   BMI 26.45 kg/m  , BMI Body mass index is 26.45 kg/m. GEN: Well nourished, well developed, in no acute distress  HEENT: normal  Cardiac: RRR;  Respiratory:    normal work of breathing GI: soft  MS: no deformity or atrophy  Skin: warm and dry  Neuro:  Strength and sensation are intact Psych: euthymic mood, full affect  EKG:  EKG is ordered today. The ekg ordered today is personally reviewed and shows sinus with LBBB  All ekgs are reviewed in epic.  I do not see sustained afib   Recent Labs: 11/01/2018: B Natriuretic Peptide 252.3 11/16/2018: Magnesium 2.0 03/07/2019: ALT 43; Hemoglobin 12.4; Platelets 298 04/04/2019: BUN 14; Creatinine, Ser 0.89; Potassium 4.7; Sodium 143  personally reviewed   Lipid Panel     Component Value Date/Time   CHOL 144 03/07/2019 1048   TRIG 258 (H) 03/07/2019 1048   HDL 55 03/07/2019 1048   CHOLHDL 2.6 03/07/2019 1048   CHOLHDL 7.8 11/07/2018 1139   VLDL 26 11/07/2018 1139   LDLCALC 49 03/07/2019 1048   personally reviewed   Wt Readings from Last 3 Encounters:  04/21/19 140 lb (63.5 kg)  03/09/19 142 lb (64.4 kg)  03/07/19 142 lb (64.4 kg)      Other studies personally reviewed: Additional studies/ records that were reviewed today include: prior ecgs, tee, echo, Dr Ferdinand Lango notes as above   ekg today reveals sinus rhythm with LBBB  ASSESSMENT AND PLAN:  1. Cryptogenic stroke The patient presented with prior cryptogenic stroke.   I agree with Dr Jaynee Eagles that additional  monitoring is advised to evaluate for afib as the possible cause of her embolic stroke findings.  I spoke at length with the patient about long term monitoring using a translator today.  Risks, benefits, and alteratives to implantable loop recorder including bleeding and infection were discussed with the patient today.   At this time, the patient is very clear in their decision to proceed with implantable loop recorder.    Army Fossa, MD  04/21/2019 2:51 PM     [address] [place] [address] [phone] (office) [phone] (fax)        PROCEDURES:  1. Implantable loop recorder implantation       DESCRIPTION OF PROCEDURE:  Informed written consent was obtained.  The patient required no sedation for the procedure today.  The patients left chest was prepped and draped. Mapping over the patient's chest was performed to identify the appropriate ILR site.  This area was found to be the left parasternal region over the 3rd-4th intercostal space.  The skin overlying this region was infiltrated with lidocaine for local analgesia.  A 0.5-cm incision was made at the implant site.  A subcutaneous ILR pocket was fashioned using a combination of sharp and blunt dissection.  A Medtronic Reveal Linq model [id number] implantable loop recorder (SN [id number])  was then placed into the pocket R waves were very prominent and measured > 0.2 mV. EBL<1 ml.  Steri- Strips and a sterile dressing were then applied.  There were no early apparent complications.     CONCLUSIONS:   1. Successful implantation of a Medtronic Reveal LINQ implantable loop recorder for cryptogenic stroke  2. No early apparent complications.   MDT Reveal CXKG8 implanted by Dr Rayann Heman by cryptogenic stroke (contact for remote monitoring is her son in law, cell [phone])   Thompson Grayer MD, [facility] 04/21/2019 2:51 PM

## 2019-04-21 NOTE — Patient Instructions (Addendum)
Medication Instructions:  Your physician recommends that you continue on your current medications as directed. Please refer to the Current Medication list given to you today.  Labwork: None ordered.  Testing/Procedures: None ordered.  Follow-Up:  You will have an appointment in 7-10 days with the device clinic for a wound check.  This is at the [facility] office where you saw Dr. Johney Frame.  May 02, 2019 at 2:30 pm    Your physician wants you to follow-up in: as needed with Dr. Johney Frame.  Any Other Special Instructions Will Be Listed Below (If Applicable).  If you need a refill on your cardiac medications before your next appointment, please call your pharmacy.    Implantable Loop Recorder Placement, Care After This sheet gives you information about how to care for yourself after your procedure. Your health care provider may also give you more specific instructions. If you have problems or questions, contact your health care provider. What can I expect after the procedure? After the procedure, it is common to have:  Soreness or discomfort near the incision.  Some swelling or bruising near the incision. Follow these instructions at home: Incision care   Follow instructions from your health care provider about how to take care of your incision. Make sure you: ? Leave your outer dressing on for 24 hours.  After 24 hours you can remove that and shower. ? Leave adhesive strips in place. These skin closures may need to stay in place for 2 weeks or longer. If adhesive strip edges start to loosen and curl up, you may trim the loose edges. Do not remove adhesive strips completely unless your health care provider tells you to do that.  Check your incision area every day for signs of infection. Check for: ? Redness, swelling, or pain. ? Fluid or blood. ? Warmth. ? Pus or a bad smell. Do not take baths, swim, or use a hot tub until your incision is completely healed.  Activity  Return  to your normal activities.  General instructions  Follow instructions from your health care provider about how to manage your implantable loop recorder and transmit the information. Learn how to activate a recording if this is necessary for your type of device.  Do not go through a metal detection gate, and do not let someone hold a metal detector over your chest. Show your ID card.  Do not have an MRI unless you check with your health care provider first.  Take over-the-counter and prescription medicines only as told by your health care provider.  Keep all follow-up visits as told by your health care provider. This is important. Contact a health care provider if:  You have redness, swelling, or pain around your incision.  You have a fever.  You have pain that is not relieved by your pain medicine.  You have triggered your device because of fainting (syncope) or because of a heartbeat that feels like it is racing, slow, fluttering, or skipping (palpitations). Get help right away if you have:  Chest pain.  Difficulty breathing. Summary  After the procedure, it is common to have soreness or discomfort near the incision.  Change your dressing as told by your health care provider.  Follow instructions from your health care provider about how to manage your implantable loop recorder and transmit the information.

## 2019-05-02 ENCOUNTER — Ambulatory Visit (INDEPENDENT_AMBULATORY_CARE_PROVIDER_SITE_OTHER): Payer: Medicare Other | Admitting: *Deleted

## 2019-05-02 ENCOUNTER — Other Ambulatory Visit: Payer: Self-pay

## 2019-05-02 DIAGNOSIS — I639 Cerebral infarction, unspecified: Secondary | ICD-10-CM

## 2019-05-02 DIAGNOSIS — Z95818 Presence of other cardiac implants and grafts: Secondary | ICD-10-CM

## 2019-05-02 LAB — CUP PACEART INCLINIC DEVICE CHECK
Date Time Interrogation Session: 20210420181627
Implantable Pulse Generator Implant Date: 20210409

## 2019-05-02 NOTE — Progress Notes (Signed)
ILR wound check in clinic with video interpretation. Steri strips removed. Wound well healed. Home monitor transmitting nightly. No episodes. Patient educated about wound care and Carelink app. Monthly summary reports and ROV with Dr. Johney Frame PRN.

## 2019-05-18 ENCOUNTER — Other Ambulatory Visit: Payer: Self-pay

## 2019-05-18 ENCOUNTER — Encounter: Payer: Self-pay | Admitting: Pharmacist

## 2019-05-18 ENCOUNTER — Ambulatory Visit: Payer: Medicare Other | Attending: Nurse Practitioner | Admitting: Pharmacist

## 2019-05-18 DIAGNOSIS — I10 Essential (primary) hypertension: Secondary | ICD-10-CM

## 2019-05-18 DIAGNOSIS — E1165 Type 2 diabetes mellitus with hyperglycemia: Secondary | ICD-10-CM

## 2019-05-18 DIAGNOSIS — E781 Pure hyperglyceridemia: Secondary | ICD-10-CM

## 2019-05-18 DIAGNOSIS — I63119 Cerebral infarction due to embolism of unspecified vertebral artery: Secondary | ICD-10-CM | POA: Diagnosis not present

## 2019-05-18 DIAGNOSIS — Z794 Long term (current) use of insulin: Secondary | ICD-10-CM | POA: Diagnosis not present

## 2019-05-18 MED ORDER — LINAGLIPTIN 5 MG PO TABS: 5.0000 mg | ORAL_TABLET | Freq: Every day | ORAL | 1 refills | Status: DC

## 2019-05-18 MED ORDER — APIXABAN 5 MG PO TABS: 5.0000 mg | ORAL_TABLET | Freq: Two times a day (BID) | ORAL | 0 refills | Status: DC

## 2019-05-18 MED ORDER — ATORVASTATIN CALCIUM 40 MG PO TABS: 40.0000 mg | ORAL_TABLET | Freq: Every day | ORAL | 0 refills | Status: DC

## 2019-05-18 MED ORDER — LOSARTAN POTASSIUM-HCTZ 100-25 MG PO TABS: 1.0000 | ORAL_TABLET | Freq: Every day | ORAL | 0 refills | Status: DC

## 2019-05-18 MED ORDER — AMLODIPINE BESYLATE 10 MG PO TABS: 10.0000 mg | ORAL_TABLET | Freq: Every day | ORAL | 1 refills | Status: DC

## 2019-05-18 MED FILL — TRADJENTA 5 MG TABLET: 5 | 90 days supply | Qty: 90 | Fill #0

## 2019-05-18 MED FILL — METOPROLOL TARTRATE 25 MG T: 25 | 90 days supply | Qty: 180 | Fill #1

## 2019-05-18 MED FILL — ELIQUIS 5 MG TABLET: 5 | 90 days supply | Qty: 180 | Fill #0

## 2019-05-18 MED FILL — AMLODIPINE BESYLATE 10 MG T: 10 | 90 days supply | Qty: 90 | Fill #0

## 2019-05-18 MED FILL — ATORVASTATIN CALCIUM 40 MG: 40 | 90 days supply | Qty: 90 | Fill #0

## 2019-05-18 MED FILL — TRUEPLUS 5-BEVEL PEN NEEDLE: 31G X 5 MM | 90 days supply | Qty: 100 | Fill #0

## 2019-05-18 NOTE — Progress Notes (Signed)
   S:    Patient arrives in good spirits. Presents to the clinic for hypertension evaluation, counseling, and management.  Patient was referred and last seen by Primary Care Provider on 03/07/19. We saw her on 04/18/2019 and added amlodipine 5 mg daily.   Current BP Medications include: amlodipine 5 mg daily, losartan-HCTZ 100-25 mg daily, metoprolol 25 mg BID  Family / Social history:  -FHx: HTN (mother, father) -Tobacco use: never smoker, denies smokeless tobacco -Alcohol use: denies  ASCVD risk factors include: HTN, CVA, Afib, DM  O:  Vitals:   05/18/19 1020  BP: (!) 145/81  Pulse: (!) 53    Last 3 Office BP readings: BP Readings from Last 3 Encounters:  05/18/19 (!) 145/81  04/21/19 (!) 150/76  04/18/19 (!) 192/84   BMET    Component Value Date/Time   NA 143 04/04/2019 1046   K 4.7 04/04/2019 1046   CL 105 04/04/2019 1046   CO2 22 04/04/2019 1046   GLUCOSE 178 (H) 04/04/2019 1046   GLUCOSE 123 (H) 11/21/2018 0615   BUN 14 04/04/2019 1046   CREATININE 0.89 04/04/2019 1046   CREATININE 0.88 11/28/2015 1223   CALCIUM 10.1 04/04/2019 1046   GFRNONAA 66 04/04/2019 1046   GFRNONAA 69 11/28/2015 1223   GFRAA 76 04/04/2019 1046   GFRAA 80 11/28/2015 1223    Renal function: CrCl cannot be calculated (Patient's most recent lab result is older than the maximum 21 days allowed.).  Clinical ASCVD: Yes  The ASCVD Risk score Denman George DC Jr., et al., 2013) failed to calculate for the following reasons:   The patient has a prior MI or stroke diagnosis   A/P: Hypertension currently above goal but improved on current medications. BP Goal = <130/80 mmHg. Patient reports adherence with HTN meds. Will increase amlodipine to 10 mg daily. Continue other medications.  -Increase amlodipine to 10 mg daily.  -Continue other medications.  -Counseled on lifestyle modifications for blood pressure control including reduced dietary sodium, increased exercise, adequate sleep  Results  reviewed and written information provided.  Total time in face-to-face counseling 20 minutes.   F/U Clinic Visit in 2 weeks.  Butch Penny, PharmD, CPP Clinical Pharmacist [facility] & [facility] [phone]

## 2019-05-22 ENCOUNTER — Ambulatory Visit (INDEPENDENT_AMBULATORY_CARE_PROVIDER_SITE_OTHER): Payer: Medicare Other | Admitting: *Deleted

## 2019-05-22 DIAGNOSIS — I639 Cerebral infarction, unspecified: Secondary | ICD-10-CM

## 2019-05-22 LAB — CUP PACEART REMOTE DEVICE CHECK
Date Time Interrogation Session: 20210510152554
Implantable Pulse Generator Implant Date: 20210409

## 2019-05-23 NOTE — Progress Notes (Signed)
Carelink Summary Report / Loop Recorder 

## 2019-06-01 ENCOUNTER — Ambulatory Visit: Payer: Medicare Other | Attending: Nurse Practitioner | Admitting: Pharmacist

## 2019-06-01 ENCOUNTER — Other Ambulatory Visit: Payer: Self-pay

## 2019-06-01 VITALS — BP 159/79 | HR 66

## 2019-06-01 DIAGNOSIS — I10 Essential (primary) hypertension: Secondary | ICD-10-CM | POA: Diagnosis not present

## 2019-06-01 MED FILL — LOSARTAN-HCTZ 100-25 MG TAB: 100-25 | 90 days supply | Qty: 90 | Fill #0

## 2019-06-01 NOTE — Progress Notes (Signed)
   S:    Patient arrives in good spirits. Presents to the clinic for hypertension evaluation, counseling, and management.  Patient was referred and last seen by Primary Care Provider on 03/07/19. We saw her on 05/18/2019 and increased amlodipine to 10 mg daily. Of note, today pt reports being without her losartan-HCTZ for ~ 1 week.   Current BP Medications include: amlodipine 10 mg daily, losartan-HCTZ 100-25 mg daily, metoprolol 25 mg BID  Family / Social history:  -FHx: HTN (mother, father) -Tobacco use: never smoker, denies smokeless tobacco -Alcohol use: denies  ASCVD risk factors include: HTN, CVA, Afib, DM  O:  Vitals:   06/01/19 1029  BP: (!) 159/79  Pulse: 66    Last 3 Office BP readings: BP Readings from Last 3 Encounters:  06/01/19 (!) 159/79  05/18/19 (!) 145/81  04/21/19 (!) 150/76   BMET    Component Value Date/Time   NA 143 04/04/2019 1046   K 4.7 04/04/2019 1046   CL 105 04/04/2019 1046   CO2 22 04/04/2019 1046   GLUCOSE 178 (H) 04/04/2019 1046   GLUCOSE 123 (H) 11/21/2018 0615   BUN 14 04/04/2019 1046   CREATININE 0.89 04/04/2019 1046   CREATININE 0.88 11/28/2015 1223   CALCIUM 10.1 04/04/2019 1046   GFRNONAA 66 04/04/2019 1046   GFRNONAA 69 11/28/2015 1223   GFRAA 76 04/04/2019 1046   GFRAA 80 11/28/2015 1223    Renal function: CrCl cannot be calculated (Patient's most recent lab result is older than the maximum 21 days allowed.).  Clinical ASCVD: Yes  The ASCVD Risk score Denman George DC Jr., et al., 2013) failed to calculate for the following reasons:   The patient has a prior MI or stroke diagnosis   A/P: Hypertension currently above goal on current medications. BP Goal = <130/80 mmHg. Patient denies adherence with HTN meds. Refills provided for losartan-HCTZ. No changes in medication regimen today.  -Continue medications. Refills given for Hyzaar.  -Counseled on lifestyle modifications for blood pressure control including reduced dietary sodium,  increased exercise, adequate sleep  Results reviewed and written information provided.  Total time in face-to-face counseling 20 minutes.   F/U Clinic Visit in June with PCP.  Butch Penny, PharmD, CPP Clinical Pharmacist [facility] & [facility] [phone]

## 2019-06-03 ENCOUNTER — Other Ambulatory Visit: Payer: Self-pay | Admitting: Nurse Practitioner

## 2019-06-03 DIAGNOSIS — I63119 Cerebral infarction due to embolism of unspecified vertebral artery: Secondary | ICD-10-CM

## 2019-06-26 ENCOUNTER — Ambulatory Visit (INDEPENDENT_AMBULATORY_CARE_PROVIDER_SITE_OTHER): Payer: Medicare Other | Admitting: *Deleted

## 2019-06-26 DIAGNOSIS — I6349 Cerebral infarction due to embolism of other cerebral artery: Secondary | ICD-10-CM

## 2019-06-26 LAB — CUP PACEART REMOTE DEVICE CHECK
Date Time Interrogation Session: 20210613230505
Implantable Pulse Generator Implant Date: 20210409

## 2019-06-28 NOTE — Progress Notes (Signed)
Carelink Summary Report / Loop Recorder 

## 2019-07-07 ENCOUNTER — Ambulatory Visit: Payer: Medicare Other | Attending: Nurse Practitioner | Admitting: Nurse Practitioner

## 2019-07-07 ENCOUNTER — Encounter: Payer: Self-pay | Admitting: Nurse Practitioner

## 2019-07-07 ENCOUNTER — Other Ambulatory Visit: Payer: Self-pay

## 2019-07-07 ENCOUNTER — Other Ambulatory Visit: Payer: Self-pay | Admitting: Nurse Practitioner

## 2019-07-07 VITALS — BP 133/72 | HR 72 | Temp 97.7°F | Ht 62.0 in | Wt 138.0 lb

## 2019-07-07 DIAGNOSIS — Z794 Long term (current) use of insulin: Secondary | ICD-10-CM

## 2019-07-07 DIAGNOSIS — E559 Vitamin D deficiency, unspecified: Secondary | ICD-10-CM | POA: Diagnosis not present

## 2019-07-07 DIAGNOSIS — E1165 Type 2 diabetes mellitus with hyperglycemia: Secondary | ICD-10-CM

## 2019-07-07 DIAGNOSIS — I63119 Cerebral infarction due to embolism of unspecified vertebral artery: Secondary | ICD-10-CM | POA: Diagnosis not present

## 2019-07-07 DIAGNOSIS — Z1211 Encounter for screening for malignant neoplasm of colon: Secondary | ICD-10-CM | POA: Diagnosis not present

## 2019-07-07 DIAGNOSIS — I10 Essential (primary) hypertension: Secondary | ICD-10-CM

## 2019-07-07 DIAGNOSIS — E781 Pure hyperglyceridemia: Secondary | ICD-10-CM

## 2019-07-07 LAB — POCT GLYCOSYLATED HEMOGLOBIN (HGB A1C): Hemoglobin A1C: 12 % — AB (ref 4.0–5.6)

## 2019-07-07 LAB — GLUCOSE, POCT (MANUAL RESULT ENTRY): POC Glucose: 219 mg/dl — AB (ref 70–99)

## 2019-07-07 MED ORDER — INSULIN GLARGINE 100 UNIT/ML SOLOSTAR PEN: 20.0000 [IU] | PEN_INJECTOR | Freq: Every day | SUBCUTANEOUS | 6 refills | Status: DC

## 2019-07-07 MED ORDER — LOSARTAN POTASSIUM-HCTZ 100-25 MG PO TABS: 1.0000 | ORAL_TABLET | Freq: Every day | ORAL | 0 refills | Status: DC

## 2019-07-07 MED ORDER — LINAGLIPTIN 5 MG PO TABS: 5.0000 mg | ORAL_TABLET | Freq: Every day | ORAL | 1 refills | Status: DC

## 2019-07-07 MED ORDER — GLUCOSE BLOOD VI STRP: ORAL_STRIP | 3 refills | Status: DC

## 2019-07-07 MED ORDER — AMLODIPINE BESYLATE 10 MG PO TABS: 10.0000 mg | ORAL_TABLET | Freq: Every day | ORAL | 1 refills | Status: DC

## 2019-07-07 MED ORDER — ATORVASTATIN CALCIUM 40 MG PO TABS: 40.0000 mg | ORAL_TABLET | Freq: Every day | ORAL | 0 refills | Status: DC

## 2019-07-07 MED ORDER — METOPROLOL TARTRATE 25 MG PO TABS: 25.0000 mg | ORAL_TABLET | Freq: Two times a day (BID) | ORAL | 1 refills | Status: DC

## 2019-07-07 MED ORDER — ACCU-CHEK SOFTCLIX LANCETS MISC: 6 refills | Status: DC

## 2019-07-07 MED ORDER — APIXABAN 5 MG PO TABS: 5.0000 mg | ORAL_TABLET | Freq: Two times a day (BID) | ORAL | 0 refills | Status: DC

## 2019-07-07 MED FILL — ACCU-CHEK SOFTCLIX LANCETS: 90 days supply | Qty: 200 | Fill #1

## 2019-07-07 MED FILL — ACCU-CHEK AVIVA PLUS STRP: 50 days supply | Qty: 200 | Fill #1

## 2019-07-07 MED FILL — LANTUS SOLOSTAR 100 UNITS/M: 100 | 90 days supply | Qty: 18 | Fill #0

## 2019-07-07 NOTE — Progress Notes (Signed)
Assessment & Plan:  Madisynn was seen today for follow-up.  Diagnoses and all orders for this visit:  Type 2 diabetes mellitus with hyperglycemia, with long-term current use of insulin (HCC) -     Glucose (CBG) -     HgB A1c -     Discontinue: insulin glargine (LANTUS) 100 UNIT/ML Solostar Pen; Inject 20 Units into the skin daily at 10 pm. Please fill as a 90 day supply -     linagliptin (TRADJENTA) 5 MG TABS tablet; Take 1 tablet (5 mg total) by mouth daily. Please fill as a 90 day supply -     glucose blood (ACCU-CHEK AVIVA) test strip; Use as instructed. Check blood glucose levels before meals and at bedtime--by sticking your finger. -     Accu-Chek Softclix Lancets lancets; Use as instructed. Check blood glucose levels twice per day by fingerstick. E11.65 -     CMP14+EGFR -     insulin glargine (LANTUS) 100 UNIT/ML Solostar Pen; Inject 20 Units into the skin daily at 10 pm. Please fill as a 90 day supply Continue blood sugar control as discussed in office today, low carbohydrate diet, and regular physical exercise as tolerated, 150 minutes per week (30 min each day, 5 days per week, or 50 min 3 days per week). Keep blood sugar logs with fasting goal of 90-130 mg/dl, post prandial (after you eat) less than 180.  For Hypoglycemia: BS <60 and Hyperglycemia BS >400; contact the clinic ASAP. Annual eye exams and foot exams are recommended.   Colon cancer screening -     Fecal occult blood, imunochemical([facility])  Essential hypertension -     metoprolol tartrate (LOPRESSOR) 25 MG tablet; Take 1 tablet (25 mg total) by mouth 2 (two) times daily. Please fill as a 90 day supply -     amLODipine (NORVASC) 10 MG tablet; Take 1 tablet (10 mg total) by mouth daily. -     losartan-hydrochlorothiazide (HYZAAR) 100-25 MG tablet; Take 1 tablet by mouth daily. Please fill as a 90 day supply -     CMP14+EGFR Continue all antihypertensives as prescribed.  Remember to bring in your blood pressure  log with you for your follow up appointment.  DASH/Mediterranean Diets are healthier choices for HTN.    Hypertriglyceridemia -     atorvastatin (LIPITOR) 40 MG tablet; Take 1 tablet (40 mg total) by mouth daily. Please fill as a 90 day supply INSTRUCTIONS: Work on a low fat, heart healthy diet and participate in regular aerobic exercise program by working out at least 150 minutes per week; 5 days a week-30 minutes per day. Avoid red meat/beef/steak,  fried foods. junk foods, sodas, sugary drinks, unhealthy snacking, alcohol and smoking.  Drink at least 80 oz of water per day and monitor your carbohydrate intake daily.    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 Please fill as a 90 day supply -     CBC  Vitamin D deficiency disease -     VITAMIN D 25 Hydroxy (Vit-D Deficiency, Fractures)    Patient has been counseled on age-appropriate routine health concerns for screening and prevention. These are reviewed and up-to-date. Referrals have been placed accordingly. Immunizations are up-to-date or declined.    Subjective:   Chief Complaint  Patient presents with   Follow-up  Pt. is here for diabetes follow up.    HPI Aimee Parker 69 y.o. female presents to office today for follow up  has a past medical history of Diabetes mellitus without complication ([place]), Hyperlipidemia, Hypertension, Stroke ([place]), and Vitamin D deficiency.  S/P implantable loop recorder. She has a prior history of cryptogenic stroke. Plan will be to monitor for afib as the possible cause of her embolic stroke.    DM TYPE 2 Poorly controlled. Notes fasting home readings: 160-170s in the mornings. Last a1c 7.3 and now 12. Increasing lantus from 10 units to 20 units. Will continue on tradjenta 5 mg daily. She will follow up in a few weeks for meter check with  lantus added today. Denies any hypo or hyperglycemic symptoms. LDL at goal. Endorses adherence with taking atorvastatin 40 mg daily Lab Results  Component Value Date   HGBA1C 12.0 (A) 07/07/2019   Lab Results  Component Value Date   LDLCALC 49 03/07/2019   Essential Hypertension Well controlled. Reports readings are well controlled at home with averages:120-130/70-80s.Currently taking metoprolol 25 mg BID, amlodipine 10 mg daily and hyzaar 100-25 mg daily. Denies chest pain, shortness of breath, palpitations, lightheadedness, dizziness, headaches or BLE edema.  BP Readings from Last 3 Encounters:  07/07/19 133/72  06/01/19 (!) 159/79  05/18/19 (!) 145/81   Review of Systems  Constitutional: Negative for fever, malaise/fatigue and weight loss.  HENT: Negative.  Negative for nosebleeds.   Eyes: Negative.  Negative for blurred vision, double vision and photophobia.  Respiratory: Negative.  Negative for cough and shortness of breath.   Cardiovascular: Negative.  Negative for chest pain, palpitations and leg swelling.  Gastrointestinal: Negative.  Negative for heartburn, nausea and vomiting.  Musculoskeletal: Negative.  Negative for myalgias.  Neurological: Negative for dizziness and headaches.  Psychiatric/Behavioral: Negative.  Negative for suicidal ideas.    Past Medical History:  Diagnosis Date   Diabetes mellitus without complication ([place])    Hyperlipidemia    Hypertension    Stroke [facility])    Vitamin D deficiency     Past Surgical History:  Procedure Laterality Date   EYE SURGERY     Left eye surgery   implantable loop recorder implantation  04/20/2019   MDT Reveal [id number] Bethann Goo [id number])  implanted by Dr Rayann Heman by cryptogenic stroke (contact for remote monitoring is her son in law, cell [phone])   PARS PLANA VITRECTOMY Right 05/13/2017   Procedure: PARS PLANA VITRECTOMY 25 GAUGE FOR ENDOPHTHALMITIS;  Surgeon: Hurman Horn, MD;  Location: [facility];  Service:  Ophthalmology;  Laterality: Right;    Family History  Problem Relation Age of Onset   Hypertension Mother    Hypertension Father    Stroke Neg Hx     Social History Reviewed with no changes to be made today.   Outpatient Medications Prior to Visit  Medication Sig Dispense Refill   acetaminophen (TYLENOL) 325 MG tablet Take 1-2 tablets (325-650 mg total) by mouth every 4 (four) hours as needed for mild pain.     Blood Glucose Monitoring Suppl (ACCU-CHEK AVIVA) device Use as instructed to check blood sugar 3 to 4 times daily. E11.9. 1 each 0   Blood Glucose Monitoring Suppl (ACCU-CHEK GUIDE) w/Device KIT CHECK BLOOD SUGAR 3 4 TIMES DAILY     Insulin Pen Needle (B-D UF III MINI PEN NEEDLES) 31G X 5 MM MISC Use as instructed. Inject into the skin once nightly. E11.65 200 each 6   Vitamin D, Ergocalciferol, (DRISDOL) 1.25  MG (50000 UNIT) CAPS capsule Take 1 capsule (50,000 Units total) by mouth every 7 (seven) days. 12 capsule 3   Accu-Chek Softclix Lancets lancets Use as instructed. Check blood glucose levels twice per day by fingerstick. E11.65 200 each 6   amLODipine (NORVASC) 10 MG tablet Take 1 tablet (10 mg total) by mouth daily. 90 tablet 1   apixaban (ELIQUIS) 5 MG TABS tablet Take 1 tablet (5 mg total) by mouth 2 (two) times daily. Please fill as a 90 day supply 180 tablet 0   atorvastatin (LIPITOR) 40 MG tablet Take 1 tablet (40 mg total) by mouth daily. Please fill as a 90 day supply 90 tablet 0   glucose blood (ACCU-CHEK AVIVA) test strip Use as instructed. Check blood glucose levels before meals and at bedtime--by sticking your finger. 200 each 3   Insulin Syringes, Disposable, U-100 0.5 ML MISC 1 application by Does not apply route daily. 100 each 0   linagliptin (TRADJENTA) 5 MG TABS tablet Take 1 tablet (5 mg total) by mouth daily. Please fill as a 90 day supply 90 tablet 1   losartan-hydrochlorothiazide (HYZAAR) 100-25 MG tablet Take 1 tablet by mouth daily. 90  tablet 0   TRUEPLUS INSULIN SYRINGE 30G X 5/16" 0.5 ML MISC      Insulin Glargine (LANTUS) 100 UNIT/ML Solostar Pen Inject 10 Units into the skin daily at 10 pm. Please fill as a 90 day supply 10 mL 6   metoprolol tartrate (LOPRESSOR) 25 MG tablet Take 1 tablet (25 mg total) by mouth 2 (two) times daily. 180 tablet 1   No facility-administered medications prior to visit.    No Known Allergies     Objective:    BP 133/72 (BP Location: Right Arm, Patient Position: Sitting, Cuff Size: Normal)    Pulse 72    Temp 97.7 F (36.5 C) (Temporal)    Ht _0  (1.575 m)    Wt 138 lb (62.6 kg)    SpO2 99%    BMI 25.24 kg/m  Wt Readings from Last 3 Encounters:  07/07/19 138 lb (62.6 kg)  04/21/19 140 lb (63.5 kg)  03/09/19 142 lb (64.4 kg)    Physical Exam Vitals and nursing note reviewed.  Constitutional:      Appearance: She is well-developed.  HENT:     Head: Normocephalic and atraumatic.  Cardiovascular:     Rate and Rhythm: Normal rate and regular rhythm.     Heart sounds: Normal heart sounds. No murmur heard.  No friction rub. No gallop.   Pulmonary:     Effort: Pulmonary effort is normal. No tachypnea or respiratory distress.     Breath sounds: Normal breath sounds. No decreased breath sounds, wheezing, rhonchi or rales.  Chest:     Chest wall: No tenderness.  Abdominal:     General: Bowel sounds are normal.     Palpations: Abdomen is soft.  Musculoskeletal:        General: Normal range of motion.     Cervical back: Normal range of motion.  Skin:    General: Skin is warm and dry.  Neurological:     Mental Status: She is alert and oriented to person, place, and time.     Coordination: Coordination normal.  Psychiatric:        Behavior: Behavior normal. Behavior is cooperative.        Thought Content: Thought content normal.        Judgment: Judgment normal.  Patient has been counseled extensively about nutrition and exercise as well as the importance of  adherence with medications and regular follow-up. The patient was given clear instructions to go to ER or return to medical center if symptoms don't improve, worsen or new problems develop. The patient verbalized understanding.   Follow-up: Return for see luke in 3 weeks. bring meter. See me in 3 months.   Gildardo Pounds, FNP-BC [facility], [place]   07/08/2019, 6:37 PM

## 2019-07-08 ENCOUNTER — Encounter: Payer: Self-pay | Admitting: Nurse Practitioner

## 2019-07-08 LAB — CMP14+EGFR
ALT: 67 IU/L — ABNORMAL HIGH (ref 0–32)
AST: 107 IU/L — ABNORMAL HIGH (ref 0–40)
Albumin/Globulin Ratio: 1.3 (ref 1.2–2.2)
Albumin: 4.8 g/dL (ref 3.8–4.8)
Alkaline Phosphatase: 94 IU/L (ref 48–121)
BUN/Creatinine Ratio: 19 (ref 12–28)
BUN: 23 mg/dL (ref 8–27)
Bilirubin Total: 0.4 mg/dL (ref 0.0–1.2)
CO2: 22 mmol/L (ref 20–29)
Calcium: 10.3 mg/dL (ref 8.7–10.3)
Chloride: 101 mmol/L (ref 96–106)
Creatinine, Ser: 1.23 mg/dL — ABNORMAL HIGH (ref 0.57–1.00)
GFR calc Af Amer: 52 mL/min/{1.73_m2} — ABNORMAL LOW (ref >59)
GFR calc non Af Amer: 45 mL/min/{1.73_m2} — ABNORMAL LOW (ref >59)
Globulin, Total: 3.7 g/dL (ref 1.5–4.5)
Glucose: 141 mg/dL — ABNORMAL HIGH (ref 65–99)
Potassium: 4.2 mmol/L (ref 3.5–5.2)
Sodium: 141 mmol/L (ref 134–144)
Total Protein: 8.5 g/dL (ref 6.0–8.5)

## 2019-07-08 LAB — CBC
Hematocrit: 38.9 % (ref 34.0–46.6)
Hemoglobin: 12.4 g/dL (ref 11.1–15.9)
MCH: 22.1 pg — ABNORMAL LOW (ref 26.6–33.0)
MCHC: 31.9 g/dL (ref 31.5–35.7)
MCV: 69 fL — ABNORMAL LOW (ref 79–97)
Platelets: 307 10*3/uL (ref 150–450)
RBC: 5.61 x10E6/uL — ABNORMAL HIGH (ref 3.77–5.28)
RDW: 13.9 % (ref 11.7–15.4)
WBC: 9.3 10*3/uL (ref 3.4–10.8)

## 2019-07-08 LAB — VITAMIN D 25 HYDROXY (VIT D DEFICIENCY, FRACTURES): Vit D, 25-Hydroxy: 33.6 ng/mL (ref 30.0–100.0)

## 2019-07-11 DIAGNOSIS — Z1211 Encounter for screening for malignant neoplasm of colon: Secondary | ICD-10-CM | POA: Diagnosis not present

## 2019-07-12 ENCOUNTER — Telehealth: Payer: Self-pay | Admitting: Nurse Practitioner

## 2019-07-12 LAB — FECAL OCCULT BLOOD, IMMUNOCHEMICAL: Fecal Occult Bld: NEGATIVE

## 2019-07-12 NOTE — Telephone Encounter (Addendum)
Pt called in because she received call from the office.  But when they spoke, she told them she does not speak [place]. Got the interpreter on the line.  If you need to speak with pt, please get [place] interpreter on line first. Relayed this message to pt.  I do not see any active message for the pt.

## 2019-07-12 NOTE — Telephone Encounter (Signed)
Spoke to patient. Pt. Stated had no concerns and stated someone had called her from this facility. CMA did not see any notes or telephone encounter for patient.

## 2019-07-20 ENCOUNTER — Other Ambulatory Visit: Payer: Self-pay | Admitting: Nurse Practitioner

## 2019-07-20 DIAGNOSIS — Z794 Long term (current) use of insulin: Secondary | ICD-10-CM

## 2019-07-20 MED ORDER — INSULIN GLARGINE 100 UNIT/ML SOLOSTAR PEN: 20.0000 [IU] | PEN_INJECTOR | Freq: Two times a day (BID) | SUBCUTANEOUS | 2 refills | Status: DC

## 2019-07-20 MED ORDER — DAPAGLIFLOZIN PROPANEDIOL 10 MG PO TABS
10.0000 mg | ORAL_TABLET | Freq: Every day | ORAL | 2 refills | Status: DC
Start: 2019-07-20 — End: 2019-10-09

## 2019-07-20 MED FILL — FARXIGA 10 MG TABLET: 10 | 30 days supply | Qty: 30 | Fill #0

## 2019-07-28 ENCOUNTER — Encounter: Payer: Self-pay | Admitting: Pharmacist

## 2019-07-28 ENCOUNTER — Ambulatory Visit: Payer: Medicare Other | Attending: Nurse Practitioner | Admitting: Pharmacist

## 2019-07-28 ENCOUNTER — Other Ambulatory Visit: Payer: Self-pay

## 2019-07-28 DIAGNOSIS — Z794 Long term (current) use of insulin: Secondary | ICD-10-CM | POA: Diagnosis not present

## 2019-07-28 DIAGNOSIS — E1165 Type 2 diabetes mellitus with hyperglycemia: Secondary | ICD-10-CM

## 2019-07-28 LAB — GLUCOSE, POCT (MANUAL RESULT ENTRY): POC Glucose: 145 mg/dl — AB (ref 70–99)

## 2019-07-28 MED ORDER — INSULIN GLARGINE 100 UNIT/ML SOLOSTAR PEN: 40.0000 [IU] | PEN_INJECTOR | Freq: Every day | SUBCUTANEOUS | 2 refills | Status: DC

## 2019-07-28 MED FILL — TRUEPLUS 5-BEVEL PEN NEEDLE: 31G X 5 MM | 90 days supply | Qty: 100 | Fill #1

## 2019-07-28 MED FILL — AMLODIPINE BESYLATE 10 MG T: 10 | 90 days supply | Qty: 90 | Fill #0

## 2019-07-28 MED FILL — METOPROLOL TARTRATE 25 MG T: 25 | 90 days supply | Qty: 180 | Fill #0

## 2019-07-28 MED FILL — ATORVASTATIN CALCIUM 40 MG: 40 | 90 days supply | Qty: 90 | Fill #0

## 2019-07-28 MED FILL — ELIQUIS 5 MG TABLET: 5 | 90 days supply | Qty: 180 | Fill #0

## 2019-07-28 NOTE — Progress Notes (Signed)
   S:    Patient arrives in good spirits. Presents to the clinic for diabetes evaluation, counseling, and management.  Patient was referred and last seen by Primary Care Provider on 07/07/2019.   Patient reports compliance with medications.  Current DM Medications include: Farxiga 10 mg daily, Lantus 20 units BID, linagliptin 5 mg daily  Family / Social history:  -FHx: HTN (mother, father) -Tobacco use: never smoker, denies smokeless tobacco -Alcohol use: denies  Dietary habits: patient reports compliance with a diabetic diet.   Exercise habits: reports that she walks daily   O:  POCT: 145   Home CBGs:  Fasting: reports 140 - 157  Random/post-prandial: not checking   BMET    Component Value Date/Time   NA 141 07/07/2019 1011   K 4.2 07/07/2019 1011   CL 101 07/07/2019 1011   CO2 22 07/07/2019 1011   GLUCOSE 141 (H) 07/07/2019 1011   GLUCOSE 123 (H) 11/21/2018 0615   BUN 23 07/07/2019 1011   CREATININE 1.23 (H) 07/07/2019 1011   CREATININE 0.88 11/28/2015 1223   CALCIUM 10.3 07/07/2019 1011   GFRNONAA 45 (L) 07/07/2019 1011   GFRNONAA 69 11/28/2015 1223   GFRAA 52 (L) 07/07/2019 1011   GFRAA 80 11/28/2015 1223   Renal function: CrCl cannot be calculated (Patient's most recent lab result is older than the maximum 21 days allowed.).  Clinical ASCVD: Yes  The ASCVD Risk score Denman George DC Jr., et al., 2013) failed to calculate for the following reasons:   The patient has a prior MI or stroke diagnosis   A/P: DM currently above goal on current medications. A1c goal < 7. Home blood sugars are above goal. Patient reports adherence with medications. Pt can verbalize appropriate hypoglycemia management plan.   -Continue medications. Change Lantus to 40 units in the morning.  -Patient may increase Lantus to 46 units daily after 1 week if fasting CBGs remain above goal.  -Counseled on lifestyle modifications for DM.   -Advised pt to check fasting and post-prandial  CBGs. -Next A1c anticipated 09/2019.  Results reviewed and written information provided.  Total time in face-to-face counseling 20 minutes.   F/U Clinic Visit in 2 weeks with me.  Butch Penny, PharmD, CPP Clinical Pharmacist [facility] & [facility] [phone]

## 2019-07-28 NOTE — Addendum Note (Signed)
Addended by: Lois Huxley, Jeannett Senior L on: 07/28/2019 04:43 PM   Modules accepted: Orders

## 2019-07-31 ENCOUNTER — Ambulatory Visit (INDEPENDENT_AMBULATORY_CARE_PROVIDER_SITE_OTHER): Payer: Medicare Other | Admitting: *Deleted

## 2019-07-31 DIAGNOSIS — I6349 Cerebral infarction due to embolism of other cerebral artery: Secondary | ICD-10-CM | POA: Diagnosis not present

## 2019-07-31 LAB — CUP PACEART REMOTE DEVICE CHECK
Date Time Interrogation Session: 20210718230509
Implantable Pulse Generator Implant Date: 20210409

## 2019-08-02 NOTE — Progress Notes (Signed)
Carelink Summary Report / Loop Recorder 

## 2019-08-17 NOTE — Progress Notes (Signed)
S:  PCP: Zelda   Patient arrives in no acute distress.  Presents for diabetes evaluation, education, and management. Patient was referred and last seen by PCP on 07/07/19.   Patient last seen by PharmD for DM on 07/28/19. BG above goal, lantus was changed from BID to daily and patient was instructed to increase to 46 units if fasting BG remained above goal.  Pertinent PMH: hx CVA, HTN  Family / Social history:  -FHx: HTN (mother, father) -Tobacco use: never smoker, denies smokeless tobacco -Alcohol use: denies  [employer] affordability: [employer]   Current diabetes medications:  1. Farxiga 10 mg daily 2. Lantus 40 units daily - continues to take 20 units BID. 3. Linagliptin 5 mg daily   Adverse effects: none   Medication adherence: none   Prior diabetes med trials: janumet (unclear why discontinued)  Hypoglycemia: - Denies any BG < 70 in last month  Lifestyle: Diet: largest meal of the day in afternoon Exercise: walks daily  O:  A1c: 12.0 (07/07/19) POCT BG today: 190  Home BG Readings  FBG: 140-150s per pt (did not bring log to visit) 2 hour post-meal/random: not checking   Lipid Panel  LDL 49 (03/07/19)  Clinical Atherosclerotic Cardiovascular Disease (ASCVD): yes - hx CVA   A: 1. Diabetes - Most recent A1c not at goal < 7%, next due 10/07/19 - Clinic FBG today not at goal of <130 - Home FBG readings not at goal, no post-prandial readings available - No hypoglycemic events - Pt did not increase lantus at last visit as directed due to possible miscommunication - Re-educated on fasting BG goal vs hypoglycemia - Future considerations: GLP1 with ASCVD benefit (discussed briefly, pt agreeable if needed, covered by her insurance)  2. ASCVD: secondary prevention - Last LDL at goal on high intensity statin - Pt on apixaban for afib, aspirin not indicated at this time  P: - Increase lantus to 46 units daily in the morning - Stop  tradjenta - Continue farxiga - Review what is a low vs FBG goal - F/U Pharmacist Clinic Visit in 1 weeks. Written patient instructions provided.  Total time in face to face counseling 45 minutes.    Laverna Peace, PharmD PGY-1 Ambulatory Care Pharmacy Resident 08/18/2019 10:21 AM   Supervision provided by:  Butch Penny, PharmD, CPP Clinical Pharmacist [facility] & [facility] [phone]

## 2019-08-18 ENCOUNTER — Ambulatory Visit: Payer: Medicare Other | Attending: Nurse Practitioner | Admitting: Pharmacist

## 2019-08-18 ENCOUNTER — Other Ambulatory Visit: Payer: Self-pay

## 2019-08-18 ENCOUNTER — Encounter: Payer: Self-pay | Admitting: Pharmacist

## 2019-08-18 DIAGNOSIS — E1165 Type 2 diabetes mellitus with hyperglycemia: Secondary | ICD-10-CM

## 2019-08-18 DIAGNOSIS — Z794 Long term (current) use of insulin: Secondary | ICD-10-CM

## 2019-08-18 LAB — GLUCOSE, POCT (MANUAL RESULT ENTRY): POC Glucose: 190 mg/dl — AB (ref 70–99)

## 2019-08-18 MED FILL — LANTUS SOLOSTAR 100 UNITS/M: 100 | 90 days supply | Qty: 36 | Fill #0

## 2019-08-18 MED FILL — LOSARTAN-HCTZ 100-25 MG TAB: 100-25 | 90 days supply | Qty: 90 | Fill #0

## 2019-08-18 MED FILL — FARXIGA 10 MG TABLET: 10 | 30 days supply | Qty: 30 | Fill #1

## 2019-08-24 NOTE — Progress Notes (Signed)
S:  PCP: Zelda  Patient arrives in no acute distress, reports she is feeling really good this week. Presents for diabetes evaluation, education, and management. Patient was referred and last seen by PCP on 07/07/19.   Patient was last seen by PharmD on 08/18/19. BG above goal, lantus increased and changed from BID to once daily, tradjenta discontinued.  Family / Social history:  -FHx: HTN (mother, father) -Tobacco use: never smoker, denies smokeless tobacco -Alcohol use: denies  [employer] affordability: [employer]   Current diabetes medications:  1. Farxiga 10 mg daily 2. Lantus 46 units daily - pt taking 40 units daily in AM  Adverse effects: denies Medication adherence: denies missed doses.  Prior diabetes med trials: janumet (unclear why discontinued)  Hypoglycemia: - Denies any BG < 70 in last week since last visit  Lifestyle: Diet: largest meal of the day in afternoon  Exercise: walks daily  O:  A1c: 12.0 (07/07/19) POCT BG today: 135  Home BG Readings FBG: 120s-130s in last week, reports 127 this AM 2 hour post-meal/random: not checking  Lipid Panel  LDL 49 (03/07/19)  Clinical Atherosclerotic Cardiovascular Disease (ASCVD): yes - hx CVA   A: 1. Diabetes - Most recent A1c not at goal < 7%, next due 09/2019 - Clinic fasting BG today close to goal of 80-130 - Home fasting BG readings at goal, no post prandial readings - Hypoglycemia: denies - Tolerating current medications - BG control improved with change from BID to once daily lantus. However, pt did not increase lantus dose from last visit, likely d/t miscommunication. - Future considerations: GLP1 with ASCVD benefit  P: - Continue lantus 40 units daily in morning - Continue Farxiga 10 mg daily - Continue check fasting BG and starting checking post prandial readings. Bring BG log to next visit - Call PharmD if BG readings > 200s prior to follow up  - F/U existing PCP Clinic  Visit on 10/09/19 Total time in face to face counseling 30 minutes.    Laverna Peace, PharmD PGY-1 Ambulatory Care Pharmacy Resident 08/25/2019 11:08 AM  Butch Penny, PharmD, CPP Clinical Pharmacist [facility] & [facility] [phone]

## 2019-08-25 ENCOUNTER — Encounter: Payer: Self-pay | Admitting: Pharmacist

## 2019-08-25 ENCOUNTER — Other Ambulatory Visit: Payer: Self-pay

## 2019-08-25 ENCOUNTER — Ambulatory Visit: Payer: Medicare Other | Attending: Nurse Practitioner | Admitting: Pharmacist

## 2019-08-25 DIAGNOSIS — Z794 Long term (current) use of insulin: Secondary | ICD-10-CM | POA: Diagnosis not present

## 2019-08-25 DIAGNOSIS — E119 Type 2 diabetes mellitus without complications: Secondary | ICD-10-CM

## 2019-08-25 LAB — POCT CBG (FASTING - GLUCOSE)-MANUAL ENTRY: Glucose Fasting, POC: 135 mg/dL — AB (ref 70–99)

## 2019-09-04 ENCOUNTER — Ambulatory Visit: Payer: Medicare Other | Admitting: Physical Medicine and Rehabilitation

## 2019-09-04 ENCOUNTER — Ambulatory Visit (INDEPENDENT_AMBULATORY_CARE_PROVIDER_SITE_OTHER): Payer: Medicare Other | Admitting: *Deleted

## 2019-09-04 DIAGNOSIS — I639 Cerebral infarction, unspecified: Secondary | ICD-10-CM

## 2019-09-04 LAB — CUP PACEART REMOTE DEVICE CHECK
Date Time Interrogation Session: 20210820230055
Implantable Pulse Generator Implant Date: 20210409

## 2019-09-05 ENCOUNTER — Other Ambulatory Visit: Payer: Self-pay

## 2019-09-05 ENCOUNTER — Encounter: Payer: Self-pay | Admitting: Physical Medicine and Rehabilitation

## 2019-09-05 ENCOUNTER — Encounter
Payer: Medicare Other | Attending: Physical Medicine and Rehabilitation | Admitting: Physical Medicine and Rehabilitation

## 2019-09-05 VITALS — BP 149/78 | HR 59 | Temp 98.7°F | Ht 62.0 in | Wt 139.0 lb

## 2019-09-05 DIAGNOSIS — I639 Cerebral infarction, unspecified: Secondary | ICD-10-CM | POA: Insufficient documentation

## 2019-09-05 DIAGNOSIS — I10 Essential (primary) hypertension: Secondary | ICD-10-CM | POA: Diagnosis not present

## 2019-09-05 NOTE — Progress Notes (Signed)
Subjective:    Patient ID: Aimee Parker, female    DOB: 06/26/1950, 69 y.o.   MRN: [id number]  HPI  Aimee Parker presents for follow-up of her cryptogenic stroke.  She is feeling great! She has been walking multiple times per day. She also swings her arms as she walks for arm exercise. Does not lift weights.   Denies pain.  Her BP is a little elevated this morning. She checks her blood pressure at home and it has been well controlled. Her BP is elevated to 149/78 in office.   She eats a very healthy diet. She eats fish and vegetables. No other meat and low Na.   At the end of this year she plans to take a citizenship test this year and asks for a note regarding her hospitalization for this.   Pain Inventory Average Pain 0 Pain Right Now 0 My pain is no pain  In the last 24 hours, has pain interfered with the following? General activity 0 Relation with others 0 Enjoyment of life 0 What TIME of day is your pain at its worst? varies Sleep (in general) Good  Pain is worse with: no pain Pain improves with: no pain Relief from Meds: no pain  Family History  Problem Relation Age of Onset  . Hypertension Mother   . Hypertension Father   . Stroke Neg Hx    Social History   Socioeconomic History  . Marital status: Married    Spouse name: Not on file  . Number of children: Not on file  . Years of education: Not on file  . Highest education level: 9th grade  Occupational History  . Not on file  Tobacco Use  . Smoking status: Never Smoker  . Smokeless tobacco: Never Used  Vaping Use  . Vaping Use: Never used  Substance and Sexual Activity  . Alcohol use: No  . Drug use: No  . Sexual activity: Not Currently  Other Topics Concern  . Not on file  Social History Narrative   Lives with husband and 2 children   Says she is both R & L handed   Caffeine: none   Social Determinants of Health   Financial Resource Strain: Low Risk   . Difficulty of Paying Living  Expenses: Not very hard  Food Insecurity:   . Worried About Programme researcher, broadcasting/film/video in the Last Year: Not on file  . Ran Out of Food in the Last Year: Not on file  Transportation Needs:   . Lack of Transportation (Medical): Not on file  . Lack of Transportation (Non-Medical): Not on file  Physical Activity:   . Days of Exercise per Week: Not on file  . Minutes of Exercise per Session: Not on file  Stress:   . Feeling of Stress : Not on file  Social Connections:   . Frequency of Communication with Friends and Family: Not on file  . Frequency of Social Gatherings with Friends and Family: Not on file  . Attends Religious Services: Not on file  . Active Member of Clubs or Organizations: Not on file  . Attends Banker Meetings: Not on file  . Marital Status: Not on file   Past Surgical History:  Procedure Laterality Date  . EYE SURGERY     Left eye surgery  . implantable loop recorder implantation  04/20/2019   MDT Reveal [id number] [facility] [id number])  implanted by Dr Johney Frame by cryptogenic stroke (contact for remote monitoring is her son  in law, cell [phone])  . PARS PLANA VITRECTOMY Right 05/13/2017   Procedure: PARS PLANA VITRECTOMY 25 GAUGE FOR ENDOPHTHALMITIS;  Surgeon: Edmon Crape, MD;  Location: [facility] OR;  Service: Ophthalmology;  Laterality: Right;   Past Surgical History:  Procedure Laterality Date  . EYE SURGERY     Left eye surgery  . implantable loop recorder implantation  04/20/2019   MDT Reveal [id number] [facility] [id number])  implanted by Dr Johney Frame by cryptogenic stroke (contact for remote monitoring is her son in law, cell [phone])  . PARS PLANA VITRECTOMY Right 05/13/2017   Procedure: PARS PLANA VITRECTOMY 25 GAUGE FOR ENDOPHTHALMITIS;  Surgeon: Edmon Crape, MD;  Location: [facility] OR;  Service: Ophthalmology;  Laterality: Right;   Past Medical History:  Diagnosis Date  . Diabetes mellitus without complication (HCC)   . Hyperlipidemia   . Hypertension   . Stroke (HCC)    . Vitamin D deficiency    BP (!) 149/78   Pulse (!) 59   Temp 98.7 F (37.1 C)   Ht 5\' 2"  (1.575 m)   Wt 139 lb (63 kg)   SpO2 97%   BMI 25.42 kg/m   Opioid Risk Score:   Fall Risk Score:  `1  Depression screen PHQ 2/9  Depression screen [facility] 2/9 12/05/2018 01/17/2018 04/08/2017 09/09/2016 02/26/2016 12/19/2015 05/23/2015  Decreased Interest 0 0 0 0 2 0 0  Down, Depressed, Hopeless 0 0 0 0 0 0 0  PHQ - 2 Score 0 0 0 0 2 0 0  Altered sleeping 2 0 - - 0 - -  Tired, decreased energy 0 2 - - 1 - -  Change in appetite 0 0 - - 1 - -  Feeling bad or failure about yourself  0 0 - - 0 - -  Trouble concentrating 0 0 - - 0 - -  Moving slowly or fidgety/restless 0 0 - - 0 - -  Suicidal thoughts 0 0 - - 0 - -  PHQ-9 Score 2 2 - - 4 - -    Review of Systems  Constitutional: Negative.   HENT: Negative.   Eyes: Negative.   Respiratory: Negative.   Cardiovascular: Negative.   Gastrointestinal: Negative.   Endocrine: Negative.   Genitourinary: Negative.   Musculoskeletal: Positive for gait problem.  Skin: Negative.   Allergic/Immunologic: Negative.   Hematological: Negative.   Psychiatric/Behavioral: Negative.   All other systems reviewed and are negative.      Objective:   Physical Exam Gen: no distress, normal appearing HEENT: oral mucosa pink and moist, NCAT Cardio: Reg rate.  Chest: normal effort, normal rate of breathing. Palpable pacemaker Abd: soft, non-distended Ext: no edema Skin: intact Neuro: Alert and oriented x3 Musculoskeletal: 5/5 strength throughout.  Psych: pleasant, normal affect    Assessment & Plan:  Aimee Parker is a 69 year old woman who presents for follow-up after cryptogenic CVA. She is doing well with no complaints of falls, pain, depression, or constipation.  To helphersleep better, I recommend chamomile tea at night, no screen time. She benefited from melatonin but no longer requires.  She has completed therapy to work on regaining left-sided  strength and cognitive communication deficit. Educated that she should work on her home exercise program and continue regular walking multiple times per day. Eventually may benefit from [facility] to improve balance.  Provided with note regarding her hospitalization to provide for her citizenship test.   Discussed her diet which is very healthy.  Her strength and mobility are  also much improved!  BP is high in office but she does check it at home and reports good control at home.  She recently had pacemaker placed and HR is currently well controlled in office.   Visit performed with aid of interpreter.

## 2019-09-07 ENCOUNTER — Encounter: Payer: Self-pay | Admitting: Adult Health

## 2019-09-07 ENCOUNTER — Ambulatory Visit (INDEPENDENT_AMBULATORY_CARE_PROVIDER_SITE_OTHER): Payer: Medicare Other | Admitting: Adult Health

## 2019-09-07 VITALS — BP 136/71 | HR 57 | Ht 62.0 in | Wt 140.4 lb

## 2019-09-07 DIAGNOSIS — Z794 Long term (current) use of insulin: Secondary | ICD-10-CM

## 2019-09-07 DIAGNOSIS — E1165 Type 2 diabetes mellitus with hyperglycemia: Secondary | ICD-10-CM | POA: Diagnosis not present

## 2019-09-07 DIAGNOSIS — I6310 Cerebral infarction due to embolism of unspecified precerebral artery: Secondary | ICD-10-CM

## 2019-09-07 DIAGNOSIS — I480 Paroxysmal atrial fibrillation: Secondary | ICD-10-CM | POA: Diagnosis not present

## 2019-09-07 DIAGNOSIS — E785 Hyperlipidemia, unspecified: Secondary | ICD-10-CM | POA: Diagnosis not present

## 2019-09-07 DIAGNOSIS — I10 Essential (primary) hypertension: Secondary | ICD-10-CM | POA: Diagnosis not present

## 2019-09-07 NOTE — Patient Instructions (Signed)
No changes today  Continue to exercise regularly for ongoing improvement of your walking    Follow up in 6 months or call earlier if needed

## 2019-09-07 NOTE — Progress Notes (Addendum)
[id number]    Provider:  Dr Jaynee Eagles Requesting Provider: Gildardo Pounds, NP Primary Care Provider:  Gildardo Pounds, NP  CC:  Embolic stroke  HPI:  Today 09/07/2019, Aimee Parker returns for stroke follow accompanied by interpreter  She has been doing well since prior visit with residual gait impairment but has been improving. She ambulates without assistive device and denies any recent falls denies new or worsening stroke/TIA symptoms Evaluated by Dr. Rayann Heman and had loop recorder placed on 04/21/2019 to further assess for atrial fibrillation.  Loop recorder has not shown atrial fibrillation thus far but did have A. fib with RVR during admission and placed on apixaban.  Remains on apixaban and aspirin for secondary stroke prevention and atrial fibrillation without bleeding or bruising Continues on atorvastatin 40 mg daily without myalgias Blood pressure today 136/71 Glucose levels stable per patient.  A1c 12.0 (6/25) PCP manages HTN, HLD and DM      History provided for reference purposes Initial consult 03/09/2019 Dr. Jaynee Eagles:  Aimee Parker is a 69 y.o. female here as requested by Gildardo Pounds, NP for embolic stroke, discharged from the hospital 11/15/2018. PMHx DM and HTN who presented to the ED with altered mentation.  She had a history of 2 to 3-days of fever, fatigue and some abdominal pain.  MRI showed multiple areas of stroke concerning for septic emboli given her fevers and positive blood cultures.  On examination she had right-sided weakness and left-sided gaze.  MRI of the brain showed multiple areas of stroke in the callosal body, splenium and left gyrus, left globus pallidus, CTA of the head and neck showed no etiology for emboli, 2D echo showed normal ejection fraction, possibly a left to right PFO however TEE was negative.  Hemoglobin A1c was uncontrolled 8.1 LDL was 97.  She was not on antiplatelets prior to admission.  She is also a cigarette smoker.   Infectious problems included pneumonia, E. coli bacteremia with positive blood cultures, fevers, acute kidney injury, acute respiratory failure intubated on ventilator support, sepsis.  TEE was negative for vegetations or PFO.  She was placed on aspirin and Plavix for 3 weeks followed by aspirin alone.  Today she is here for follow-up. She is here with an interpreter and feels she is doing well. The alter mental status is resolved. She says her weakness is better. She is walking. I had a long discussion about afib, difficult via interpreter, and the risk for afib and using a loop recorder. The stroke may have been embolic due to sepsis or she may have had afib due to sepsis prior to admission but I do feel an evaluation with Dr. Rayann Heman would be prudent. She is taking aspirin not the plavix. She has graduated from PT and feels great. No other focal neurologic deficits, associated symptoms, inciting events or modifiable factors.  Review of Systems: Patient complains of symptoms per HPI as well as the following symptoms: stroke Pertinent negatives and positives per HPI. All others negative.   Social History   Socioeconomic History  . Marital status: Married    Spouse name: Not on file  . Number of children: Not on file  . Years of education: Not on file  . Highest education level: 9th grade  Occupational History  . Not on file  Tobacco Use  . Smoking status: Never Smoker  . Smokeless tobacco: Never Used  Vaping Use  . Vaping Use: Never used  Substance and Sexual Activity  .  Alcohol use: No  . Drug use: No  . Sexual activity: Not Currently  Other Topics Concern  . Not on file  Social History Narrative   Lives with husband and 2 children   Says she is both R & L handed   Caffeine: none   Social Determinants of Health   Financial Resource Strain: Low Risk   . Difficulty of Paying Living Expenses: Not very hard  Food Insecurity:   . Worried About Charity fundraiser in the Last Year:  Not on file  . Ran Out of Food in the Last Year: Not on file  Transportation Needs:   . Lack of Transportation (Medical): Not on file  . Lack of Transportation (Non-Medical): Not on file  Physical Activity:   . Days of Exercise per Week: Not on file  . Minutes of Exercise per Session: Not on file  Stress:   . Feeling of Stress : Not on file  Social Connections:   . Frequency of Communication with Friends and Family: Not on file  . Frequency of Social Gatherings with Friends and Family: Not on file  . Attends Religious Services: Not on file  . Active Member of Clubs or Organizations: Not on file  . Attends Archivist Meetings: Not on file  . Marital Status: Not on file  Intimate Partner Violence:   . Fear of Current or Ex-Partner: Not on file  . Emotionally Abused: Not on file  . Physically Abused: Not on file  . Sexually Abused: Not on file    Family History  Problem Relation Age of Onset  . Hypertension Mother   . Hypertension Father   . Stroke Neg Hx     Past Medical History:  Diagnosis Date  . Diabetes mellitus without complication ([place])   . Hyperlipidemia   . Hypertension   . Stroke ([place])   . Vitamin D deficiency     Patient Active Problem List   Diagnosis Date Noted  . Cryptogenic stroke ([place]) 04/21/2019  . Dependence on respirator (ventilator) status ([place]) 03/07/2019  . Hemiplegia and hemiparesis following cerebral infarction affecting left non-dominant side ([place]) 03/07/2019  . Type 2 diabetes mellitus with hyperglycemia, with long-term current use of insulin ([place]) 03/07/2019  . Paroxysmal atrial fibrillation ([place]) 03/07/2019  . DM (diabetes mellitus), type 2 ([place]) 11/15/2018  . Embolic stroke ([place]) 65/99/3570  . Urinary tract infection without hematuria   . E coli bacteremia 11/08/2018  . Cerebral embolism with cerebral infarction 11/07/2018  . Pneumonia   . Pyelonephritis   . Endotracheal tube present   . Septic shock ([place])   . Sepsis ([place])  11/01/2018  . Acute respiratory failure ([place]) 11/01/2018  . Endophthalmitis, acute 05/13/2017  . Healthcare maintenance 09/09/2016  . DM (diabetes mellitus) type 2, uncontrolled, with ketoacidosis ([place]) 05/17/2014  . Heart murmur 12/14/2013  . Cough 07/06/2013  . Hypertriglyceridemia 07/06/2013  . Type 2 diabetes mellitus without complication, with long-term current use of insulin ([place]) 07/06/2013  . Essential hypertension, benign 12/22/2012  . Hyperglycemia 11/17/2012  . Hypertension, uncontrolled 11/17/2012    Past Surgical History:  Procedure Laterality Date  . EYE SURGERY     Left eye surgery  . implantable loop recorder implantation  04/20/2019   MDT Reveal [id number] [facility] [id number])  implanted by Dr Rayann Heman by cryptogenic stroke (contact for remote monitoring is her son in law, cell [phone])  . PARS PLANA VITRECTOMY Right 05/13/2017   Procedure: PARS PLANA VITRECTOMY 25 GAUGE FOR ENDOPHTHALMITIS;  Surgeon: Hurman Horn, MD;  Location: [facility];  Service: Ophthalmology;  Laterality: Right;    Current Outpatient Medications  Medication Sig Dispense Refill  . Accu-Chek Softclix Lancets lancets Use as instructed. Check blood glucose levels twice per day by fingerstick. E11.65 200 each 6  . acetaminophen (TYLENOL) 325 MG tablet Take 1-2 tablets (325-650 mg total) by mouth every 4 (four) hours as needed for mild pain.    Marland Kitchen amLODipine (NORVASC) 10 MG tablet Take 1 tablet (10 mg total) by mouth daily. 90 tablet 1  . apixaban (ELIQUIS) 5 MG TABS tablet Take 1 tablet (5 mg total) by mouth 2 (two) times daily. Please fill as a 90 day supply 180 tablet 0  . atorvastatin (LIPITOR) 40 MG tablet Take 1 tablet (40 mg total) by mouth daily. Please fill as a 90 day supply 90 tablet 0  . Blood Glucose Monitoring Suppl (ACCU-CHEK AVIVA) device Use as instructed to check blood sugar 3 to 4 times daily. E11.9. 1 each 0  . Blood Glucose Monitoring Suppl (ACCU-CHEK GUIDE) w/Device KIT CHECK BLOOD SUGAR 3 4  TIMES DAILY    . dapagliflozin propanediol (FARXIGA) 10 MG TABS tablet Take 1 tablet (10 mg total) by mouth daily before breakfast. 30 tablet 2  . glucose blood (ACCU-CHEK AVIVA) test strip Use as instructed. Check blood glucose levels before meals and at bedtime--by sticking your finger. 200 each 3  . insulin glargine (LANTUS) 100 UNIT/ML Solostar Pen Inject 40 Units into the skin daily. Please fill as a 90 day supply 40 mL 2  . Insulin Pen Needle (B-D UF III MINI PEN NEEDLES) 31G X 5 MM MISC Use as instructed. Inject into the skin once nightly. E11.65 200 each 6  . linagliptin (TRADJENTA) 5 MG TABS tablet Take 1 tablet (5 mg total) by mouth daily. Please fill as a 90 day supply 90 tablet 1  . losartan-hydrochlorothiazide (HYZAAR) 100-25 MG tablet Take 1 tablet by mouth daily. Please fill as a 90 day supply 90 tablet 0  . metoprolol tartrate (LOPRESSOR) 25 MG tablet Take 1 tablet (25 mg total) by mouth 2 (two) times daily. Please fill as a 90 day supply 180 tablet 1  . Vitamin D, Ergocalciferol, (DRISDOL) 1.25 MG (50000 UNIT) CAPS capsule Take 1 capsule (50,000 Units total) by mouth every 7 (seven) days. 12 capsule 3   No current facility-administered medications for this visit.    Allergies as of 09/07/2019  . (No Known Allergies)    Vitals: Today's Vitals   09/07/19 0947  BP: 136/71  Pulse: (!) 57  Weight: 140 lb 6.4 oz (63.7 kg)  Height: _0  (1.575 m)   Body mass index is 25.68 kg/m.  General: well developed, well nourished,  pleasant middle-aged female, seated, in no evident distress Head: head normocephalic and atraumatic.   Neck: supple with no carotid or supraclavicular bruits Cardiovascular: regular rate and rhythm, no murmurs Musculoskeletal: no deformity Skin:  no rash/petichiae Vascular:  Normal pulses all extremities   Neurologic Exam Mental Status: Awake and fully alert.   Difficulty assessing language or speech due to primary language of [place] but denies  speech or language difficulty.  Oriented to place and time. Recent and remote memory intact. Attention span, concentration and fund of knowledge appropriate. Mood and affect appropriate.  Cranial Nerves: Pupils equal, briskly reactive to light. Extraocular movements full without nystagmus. Visual fields full to confrontation. Hearing intact. Facial sensation intact. Face, tongue, palate moves normally and symmetrically.  Motor: Normal bulk and tone. Normal strength in all tested extremity muscles. Sensory.: intact to touch , pinprick , position and vibratory sensation.  Coordination: Rapid alternating movements normal in all extremities. Finger-to-nose and heel-to-shin performed accurately bilaterally. Gait and Station: Arises from chair without difficulty. Stance is normal. Gait demonstrates  short shuffled steps and mild imbalance without use of assistive device Reflexes: 1+ and symmetric. Toes downgoing.    Assessment/Plan:  69 y.o. female here as requested by Gildardo Pounds, NP for embolic stroke after presenting to ED with altered mentation, discharged from the hospital 11/15/2018. MRI showed multiple areas of stroke concerning for septic emboli given her fevers and positive blood cultures.  On examination she had right-sided weakness and left-sided gaze.  MRI of the brain showed multiple areas of stroke in the callosal body, splenium and left gyrus, left globus pallidus.  A. fib with RVR documented during admission and initiate apixaban.  Loop recorder placed in 04/2019 by Dr. Rayann Heman for further evaluation of atrial fibrillation.  Residual gait impairment post stroke Continue aspirin and apixaban for atrial fibrillation and secondary stroke prevention Continue atorvastatin 40 mg daily for HLD management Ensure close PCP follow-up for aggressive stroke risk factor management including HTN with BP<130/90, HLD with LDL goal<70 and DM with A1c goal<6.5. Continue to follow with cardiology for atrial  fibrillation management and monitoring of loop recorder   Follow-up in 6 months or call earlier if needed  I spent 30 minutes of face-to-face and non-face-to-face time with patient assisted by interpreter.  This included previsit chart review, lab review, study review, order entry, electronic health record documentation, patient education regarding prior stroke and review of loop recorder monthly reports, residual deficits, discussion regarding progressive shortness factor management and ongoing use of prescribed medications and answered all questions to patient satisfaction    Cc: Gildardo Pounds, NP   Frann Rider, AGNP-BC  [facility] [address] [place], [place] [address]  Phone [phone] Fax [phone] Note: This document was prepared with digital dictation and possible smart phrase technology. Any transcriptional errors that result from this process are unintentional.  Made any corrections needed, and agree with history, physical, neuro exam,assessment and plan as stated.     Sarina Ill, MD [facility]

## 2019-09-11 NOTE — Progress Notes (Signed)
Carelink Summary Report / Loop Recorder 

## 2019-10-08 LAB — CUP PACEART REMOTE DEVICE CHECK
Date Time Interrogation Session: 20210922230520
Implantable Pulse Generator Implant Date: 20210409

## 2019-10-09 ENCOUNTER — Ambulatory Visit (INDEPENDENT_AMBULATORY_CARE_PROVIDER_SITE_OTHER): Payer: Medicare Other | Admitting: Emergency Medicine

## 2019-10-09 ENCOUNTER — Other Ambulatory Visit: Payer: Self-pay

## 2019-10-09 ENCOUNTER — Other Ambulatory Visit: Payer: Self-pay | Admitting: Nurse Practitioner

## 2019-10-09 ENCOUNTER — Encounter: Payer: Self-pay | Admitting: Nurse Practitioner

## 2019-10-09 ENCOUNTER — Ambulatory Visit: Payer: Medicare Other | Attending: Nurse Practitioner | Admitting: Nurse Practitioner

## 2019-10-09 VITALS — BP 154/67 | HR 57 | Temp 97.7°F | Ht 62.0 in | Wt 141.0 lb

## 2019-10-09 DIAGNOSIS — E1165 Type 2 diabetes mellitus with hyperglycemia: Secondary | ICD-10-CM | POA: Diagnosis not present

## 2019-10-09 DIAGNOSIS — Z1231 Encounter for screening mammogram for malignant neoplasm of breast: Secondary | ICD-10-CM

## 2019-10-09 DIAGNOSIS — I63119 Cerebral infarction due to embolism of unspecified vertebral artery: Secondary | ICD-10-CM

## 2019-10-09 DIAGNOSIS — Z794 Long term (current) use of insulin: Secondary | ICD-10-CM

## 2019-10-09 DIAGNOSIS — E781 Pure hyperglyceridemia: Secondary | ICD-10-CM | POA: Diagnosis not present

## 2019-10-09 DIAGNOSIS — E119 Type 2 diabetes mellitus without complications: Secondary | ICD-10-CM | POA: Diagnosis not present

## 2019-10-09 DIAGNOSIS — I10 Essential (primary) hypertension: Secondary | ICD-10-CM | POA: Diagnosis not present

## 2019-10-09 DIAGNOSIS — Z23 Encounter for immunization: Secondary | ICD-10-CM

## 2019-10-09 DIAGNOSIS — I639 Cerebral infarction, unspecified: Secondary | ICD-10-CM

## 2019-10-09 LAB — POCT GLYCOSYLATED HEMOGLOBIN (HGB A1C): Hemoglobin A1C: 10.7 % — AB (ref 4.0–5.6)

## 2019-10-09 LAB — GLUCOSE, POCT (MANUAL RESULT ENTRY): POC Glucose: 202 mg/dl — AB (ref 70–99)

## 2019-10-09 MED ORDER — DAPAGLIFLOZIN PROPANEDIOL 10 MG PO TABS: 10.0000 mg | ORAL_TABLET | Freq: Every day | ORAL | 1 refills | Status: DC

## 2019-10-09 MED ORDER — LOSARTAN POTASSIUM-HCTZ 100-25 MG PO TABS: 1.0000 | ORAL_TABLET | Freq: Every day | ORAL | 0 refills | Status: DC

## 2019-10-09 MED ORDER — TRULICITY 0.75 MG/0.5ML ~~LOC~~ SOAJ: 0.7500 mg | SUBCUTANEOUS | 2 refills | Status: DC

## 2019-10-09 MED ORDER — METOPROLOL TARTRATE 25 MG PO TABS: 25.0000 mg | ORAL_TABLET | Freq: Two times a day (BID) | ORAL | 1 refills | Status: DC

## 2019-10-09 MED ORDER — ATORVASTATIN CALCIUM 40 MG PO TABS: 40.0000 mg | ORAL_TABLET | Freq: Every day | ORAL | 1 refills | Status: DC

## 2019-10-09 MED ORDER — APIXABAN 5 MG PO TABS: 5.0000 mg | ORAL_TABLET | Freq: Two times a day (BID) | ORAL | 0 refills | Status: DC

## 2019-10-09 MED ORDER — BD PEN NEEDLE MINI U/F 31G X 5 MM MISC: 6 refills | Status: DC

## 2019-10-09 MED ORDER — INSULIN GLARGINE 100 UNIT/ML SOLOSTAR PEN: 40.0000 [IU] | PEN_INJECTOR | Freq: Every day | SUBCUTANEOUS | 2 refills | Status: DC

## 2019-10-09 MED ORDER — ACCU-CHEK SOFTCLIX LANCETS MISC
6 refills | Status: AC
Start: 2019-10-09 — End: ?

## 2019-10-09 MED ORDER — GLUCOSE BLOOD VI STRP: ORAL_STRIP | 3 refills | Status: AC

## 2019-10-09 MED FILL — TRULICITY 0.75 MG/0.5 ML PE: 0.75 | 30 days supply | Qty: 2 | Fill #0

## 2019-10-09 MED FILL — METOPROLOL TARTRATE 25 MG T: 25 | 90 days supply | Qty: 180 | Fill #0

## 2019-10-09 MED FILL — ACCU-CHEK AVIVA PLUS STRP: 50 days supply | Qty: 200 | Fill #0

## 2019-10-09 MED FILL — TRUEPLUS 5-BEVEL PEN NEEDLE: 31G X 5 MM | 90 days supply | Qty: 100 | Fill #0

## 2019-10-09 MED FILL — ACCU-CHEK SOFTCLIX LANCETS: 50 days supply | Qty: 200 | Fill #0

## 2019-10-09 MED FILL — FARXIGA 10 MG TABLET: 10 | 90 days supply | Qty: 90 | Fill #0

## 2019-10-09 MED FILL — ELIQUIS 5 MG TABLET: 5 | 90 days supply | Qty: 180 | Fill #0

## 2019-10-09 NOTE — Progress Notes (Signed)
Assessment & Plan:  Aimee Parker was seen today for follow-up.  Diagnoses and all orders for this visit:  Type 2 diabetes mellitus with hyperglycemia, with long-term current use of insulin (HCC) -     Glucose (CBG) -     HgB A1c -     Microalbumin/Creatinine Ratio, Urine -     dapagliflozin propanediol (FARXIGA) 10 MG TABS tablet; Take 1 tablet (10 mg total) by mouth daily before breakfast. Please fill as a 90 day supply -     Insulin Pen Needle (B-D UF III MINI PEN NEEDLES) 31G X 5 MM MISC; Use as instructed. Inject into the skin once nightly. E11.65 -     insulin glargine (LANTUS) 100 UNIT/ML Solostar Pen; Inject 40 Units into the skin daily. Please fill as a 90 day supply -     Dulaglutide (TRULICITY) 0.63 KZ/6.0FU SOPN; Inject 0.75 mg into the skin once a week. Please fill as a 90 day supply -     glucose blood (ACCU-CHEK AVIVA) test strip; Use as instructed. Check blood glucose levels before meals and at bedtime--by sticking your finger. -     Accu-Chek Softclix Lancets lancets; Use as instructed. Check blood glucose levels twice per day by fingerstick. E11.65 Continue blood sugar control as discussed in office today, low carbohydrate diet, and regular physical exercise as tolerated, 150 minutes per week (30 min each day, 5 days per week, or 50 min 3 days per week). Keep blood sugar logs with fasting goal of 90-130 mg/dl, post prandial (after you eat) less than 180.  For Hypoglycemia: BS <60 and Hyperglycemia BS >400; contact the clinic ASAP. Annual eye exams and foot exams are recommended.   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 Please fill as a 90 day supply  Hypertriglyceridemia -     atorvastatin (LIPITOR) 40 MG tablet; Take 1 tablet (40 mg total) by mouth daily. Please fill as a 90 day supply INSTRUCTIONS: Work on a low fat,  heart healthy diet and participate in regular aerobic exercise program by working out at least 150 minutes per week; 5 days a week-30 minutes per day. Avoid red meat/beef/steak,  fried foods. junk foods, sodas, sugary drinks, unhealthy snacking, alcohol and smoking.  Drink at least 80 oz of water per day and monitor your carbohydrate intake daily.    Essential hypertension -     losartan-hydrochlorothiazide (HYZAAR) 100-25 MG tablet; Take 1 tablet by mouth daily. Please fill as a 90 day supply -     metoprolol tartrate (LOPRESSOR) 25 MG tablet; Take 1 tablet (25 mg total) by mouth 2 (two) times daily. Please fill as a 90 day supply Continue all antihypertensives as prescribed.  Remember to bring in your blood pressure log with you for your follow up appointment.  DASH/Mediterranean Diets are healthier choices for HTN.    Breast cancer screening by mammogram -     MM 3D SCREEN BREAST BILATERAL; Future  Need for immunization against influenza -     Flu Vaccine QUAD 6+ mos PF IM (Fluarix Quad PF)    Patient has been counseled on age-appropriate routine health concerns for screening and prevention. These are reviewed and up-to-date. Referrals have been placed accordingly. Immunizations are up-to-date or declined.    Subjective:   Chief Complaint  Patient presents with  .  Follow-up    Pt. is here for hypertension and diabetes follow up.    HPI Aimee Parker 69 y.o. female presents to office today for follow up.  has a past medical history of Diabetes mellitus without complication ([place]), Hyperlipidemia, Hypertension, Stroke ([place]), and Vitamin D deficiency.   Essential Hypertension Reports well controlled at home with average readings 130/70s. She endorses taking lopressor 25 mg BID, amlodipine 10 mg and hyzaar 100-25 mg daily as prescribed. Denies chest pain, shortness of breath, palpitations, lightheadedness, dizziness, headaches or BLE edema.  BP Readings from Last 3 Encounters:    10/09/19 (!) 154/67  09/07/19 136/71  09/05/19 (!) 149/78    DM TYPE 2 Improved however still not at goal. Average meter readings 7 day 128 14 day 144 30 day 144 90 day 144 Blood glucose reading at home this morning was 145. Not correlating with her glucose reaading in office today. She had a bowl of rice last night and pork.  She still has not increased her lantus to 46 units from 40 units.  LDL is at goal with taking atorvastatin 40 mg daily. UTD with eye exam. Will be adding trulicity today as DM not under control. She denies any symptoms of hypo or hyperglycemia.  Lab Results  Component Value Date   HGBA1C 10.7 (A) 10/09/2019   Lab Results  Component Value Date   LDLCALC 49 03/07/2019   Review of Systems  Constitutional: Negative for fever, malaise/fatigue and weight loss.  HENT: Negative.  Negative for nosebleeds.   Eyes: Negative.  Negative for blurred vision, double vision and photophobia.  Respiratory: Negative.  Negative for cough and shortness of breath.   Cardiovascular: Negative.  Negative for chest pain, palpitations and leg swelling.  Gastrointestinal: Negative.  Negative for heartburn, nausea and vomiting.  Musculoskeletal: Negative.  Negative for myalgias.  Neurological: Negative.  Negative for dizziness, focal weakness, seizures and headaches.  Psychiatric/Behavioral: Negative.  Negative for suicidal ideas.    Past Medical History:  Diagnosis Date  . Diabetes mellitus without complication ([place])   . Hyperlipidemia   . Hypertension   . Stroke ([place])   . Vitamin D deficiency     Past Surgical History:  Procedure Laterality Date  . EYE SURGERY     Left eye surgery  . implantable loop recorder implantation  04/20/2019   MDT Reveal [id number] [facility] [id number])  implanted by Dr Rayann Heman by cryptogenic stroke (contact for remote monitoring is her son in law, cell [phone])  . PARS PLANA VITRECTOMY Right 05/13/2017   Procedure: PARS PLANA VITRECTOMY 25 GAUGE FOR  ENDOPHTHALMITIS;  Surgeon: Hurman Horn, MD;  Location: [facility];  Service: Ophthalmology;  Laterality: Right;    Family History  Problem Relation Age of Onset  . Hypertension Mother   . Hypertension Father   . Stroke Neg Hx     Social History Reviewed with no changes to be made today.   Outpatient Medications Prior to Visit  Medication Sig Dispense Refill  . acetaminophen (TYLENOL) 325 MG tablet Take 1-2 tablets (325-650 mg total) by mouth every 4 (four) hours as needed for mild pain.    Marland Kitchen amLODipine (NORVASC) 10 MG tablet Take 1 tablet (10 mg total) by mouth daily. 90 tablet 1  . Blood Glucose Monitoring Suppl (ACCU-CHEK AVIVA) device Use as instructed to check blood sugar 3 to 4 times daily. E11.9. 1 each 0  . Blood Glucose Monitoring Suppl (ACCU-CHEK GUIDE) w/Device KIT CHECK BLOOD SUGAR 3 4 TIMES DAILY    .  Vitamin D, Ergocalciferol, (DRISDOL) 1.25 MG (50000 UNIT) CAPS capsule Take 1 capsule (50,000 Units total) by mouth every 7 (seven) days. 12 capsule 3  . Accu-Chek Softclix Lancets lancets Use as instructed. Check blood glucose levels twice per day by fingerstick. E11.65 200 each 6  . dapagliflozin propanediol (FARXIGA) 10 MG TABS tablet Take 1 tablet (10 mg total) by mouth daily before breakfast. 30 tablet 2  . glucose blood (ACCU-CHEK AVIVA) test strip Use as instructed. Check blood glucose levels before meals and at bedtime--by sticking your finger. 200 each 3  . insulin glargine (LANTUS) 100 UNIT/ML Solostar Pen Inject 40 Units into the skin daily. Please fill as a 90 day supply 40 mL 2  . Insulin Pen Needle (B-D UF III MINI PEN NEEDLES) 31G X 5 MM MISC Use as instructed. Inject into the skin once nightly. E11.65 200 each 6  . linagliptin (TRADJENTA) 5 MG TABS tablet Take 1 tablet (5 mg total) by mouth daily. Please fill as a 90 day supply 90 tablet 1  . losartan-hydrochlorothiazide (HYZAAR) 100-25 MG tablet Take 1 tablet by mouth daily. Please fill as a 90 day supply 90 tablet 0    . apixaban (ELIQUIS) 5 MG TABS tablet Take 1 tablet (5 mg total) by mouth 2 (two) times daily. Please fill as a 90 day supply 180 tablet 0  . atorvastatin (LIPITOR) 40 MG tablet Take 1 tablet (40 mg total) by mouth daily. Please fill as a 90 day supply 90 tablet 0  . metoprolol tartrate (LOPRESSOR) 25 MG tablet Take 1 tablet (25 mg total) by mouth 2 (two) times daily. Please fill as a 90 day supply 180 tablet 1   No facility-administered medications prior to visit.    No Known Allergies     Objective:    BP (!) 154/67 (BP Location: Left Arm, Patient Position: Sitting, Cuff Size: Normal)   Pulse (!) 57   Temp 97.7 F (36.5 C) (Temporal)   Ht _0  (1.575 m)   Wt 141 lb (64 kg)   SpO2 100%   BMI 25.79 kg/m  Wt Readings from Last 3 Encounters:  10/09/19 141 lb (64 kg)  09/07/19 140 lb 6.4 oz (63.7 kg)  09/05/19 139 lb (63 kg)    Physical Exam Vitals and nursing note reviewed.  Constitutional:      Appearance: She is well-developed.  HENT:     Head: Normocephalic and atraumatic.  Cardiovascular:     Rate and Rhythm: Normal rate and regular rhythm.     Heart sounds: Normal heart sounds. No murmur heard.  No friction rub. No gallop.   Pulmonary:     Effort: Pulmonary effort is normal. No tachypnea or respiratory distress.     Breath sounds: Normal breath sounds. No decreased breath sounds, wheezing, rhonchi or rales.  Chest:     Chest wall: No tenderness.  Abdominal:     General: Bowel sounds are normal.     Palpations: Abdomen is soft.  Musculoskeletal:        General: Normal range of motion.     Cervical back: Normal range of motion.  Skin:    General: Skin is warm and dry.  Neurological:     Mental Status: She is alert and oriented to person, place, and time.     Coordination: Coordination normal.  Psychiatric:        Behavior: Behavior normal. Behavior is cooperative.        Thought Content: Thought content normal.  Judgment: Judgment normal.           Patient has been counseled extensively about nutrition and exercise as well as the importance of adherence with medications and regular follow-up. The patient was given clear instructions to go to ER or return to medical center if symptoms don't improve, worsen or new problems develop. The patient verbalized understanding.   Follow-up: Return for see luke in 4 weeks for meter check/started on trulicity. See me in 3 months/foot exam.   Gildardo Pounds, FNP-BC [facility] [place], [place]   10/09/2019, 4:06 PM

## 2019-10-10 LAB — MICROALBUMIN / CREATININE URINE RATIO
Creatinine, Urine: 41.5 mg/dL
Microalb/Creat Ratio: 283 mg/g creat — ABNORMAL HIGH (ref 0–29)
Microalbumin, Urine: 117.5 ug/mL

## 2019-10-11 NOTE — Progress Notes (Signed)
Carelink Summary Report / Loop Recorder 

## 2019-10-30 ENCOUNTER — Other Ambulatory Visit: Payer: Self-pay | Admitting: Nurse Practitioner

## 2019-10-30 DIAGNOSIS — I10 Essential (primary) hypertension: Secondary | ICD-10-CM

## 2019-10-30 DIAGNOSIS — E781 Pure hyperglyceridemia: Secondary | ICD-10-CM

## 2019-10-30 MED ORDER — ATORVASTATIN CALCIUM 40 MG PO TABS: 40.0000 mg | ORAL_TABLET | Freq: Every day | ORAL | 1 refills | Status: DC

## 2019-10-30 MED ORDER — AMLODIPINE BESYLATE 10 MG PO TABS: 10.0000 mg | ORAL_TABLET | Freq: Every day | ORAL | 1 refills | Status: DC

## 2019-10-30 MED ORDER — LOSARTAN POTASSIUM-HCTZ 100-25 MG PO TABS: 1.0000 | ORAL_TABLET | Freq: Every day | ORAL | 0 refills | Status: DC

## 2019-10-30 MED FILL — LOSARTAN-HCTZ 100-25 MG TAB: 100-25 | 90 days supply | Qty: 90 | Fill #0

## 2019-10-30 MED FILL — AMLODIPINE BESYLATE 10 MG T: 10 | 90 days supply | Qty: 90 | Fill #1

## 2019-10-30 MED FILL — ATORVASTATIN CALCIUM 40 MG: 40 | 90 days supply | Qty: 90 | Fill #0

## 2019-10-30 NOTE — Telephone Encounter (Signed)
Medication Refill - Medication: Losartan, amlodipine, atorvastatin   Has the patient contacted their pharmacy? Yes.   Aimee Parker called stating that the pt is completely out of some of these medications and is requesting to have them sent in today to last until her appt. Please advise.  (Agent: If no, request that the patient contact the pharmacy for the refill.) (Agent: If yes, when and what did the pharmacy advise?)  Preferred Pharmacy (with phone number or street name):  [facility] - [place], [place] - [address]  [address] [place] [address]  Phone: [phone] Fax: [phone]  Hours: Not open 24 hours     Agent: Please be advised that RX refills may take up to 3 business days. We ask that you follow-up with your pharmacy.

## 2019-11-07 ENCOUNTER — Other Ambulatory Visit: Payer: Self-pay | Admitting: Family Medicine

## 2019-11-07 ENCOUNTER — Ambulatory Visit: Payer: Medicare Other | Attending: Nurse Practitioner | Admitting: Pharmacist

## 2019-11-07 ENCOUNTER — Other Ambulatory Visit: Payer: Self-pay

## 2019-11-07 ENCOUNTER — Encounter: Payer: Self-pay | Admitting: Pharmacist

## 2019-11-07 DIAGNOSIS — Z794 Long term (current) use of insulin: Secondary | ICD-10-CM | POA: Diagnosis not present

## 2019-11-07 DIAGNOSIS — E1165 Type 2 diabetes mellitus with hyperglycemia: Secondary | ICD-10-CM

## 2019-11-07 LAB — GLUCOSE, POCT (MANUAL RESULT ENTRY): POC Glucose: 225 mg/dl — AB (ref 70–99)

## 2019-11-07 MED ORDER — TRULICITY 1.5 MG/0.5ML ~~LOC~~ SOAJ: 1.5000 mg | SUBCUTANEOUS | 2 refills | Status: DC

## 2019-11-07 MED ORDER — INSULIN GLARGINE 100 UNIT/ML SOLOSTAR PEN: 25.0000 [IU] | PEN_INJECTOR | Freq: Every day | SUBCUTANEOUS | 1 refills | Status: DC

## 2019-11-07 MED FILL — LANTUS SOLOSTAR 100 UNITS/M: 100 | 60 days supply | Qty: 15 | Fill #0

## 2019-11-07 MED FILL — TRULICITY 1.5 MG/0.5 ML PEN: 1.5 | 28 days supply | Qty: 2 | Fill #0

## 2019-11-07 NOTE — Patient Instructions (Signed)
Vietnamese: C?m ?n b?n ? ??n g?p ti ngy hm nay. Vui lng lm nh? sau:  1. Chng ti ? t?ng Trulicity c?a b?n ngy hm nay. L?y ci ny t? hi?u thu?c. Tim m?i tu?n m?t l?n. 2. Gi?m insulin c?a b?n xu?ng 25 ??n v? m?i ngy. 3. Ti?p t?c ki?m tra l??ng ???ng trong mu t?i nh. 4. Ti?p t?c th?c hi?n nh?ng thay ??i l?i s?ng m chng ti ? th?o lu?n cng nhau trong chuy?n th?m c?a chng ti. Ch? ?? ?n u?ng v t?p th? d?c ?ng m?t vai tr quan tr?ng trong vi?c c?i thi?n l??ng ???ng trong mu c?a b?n. 5. Theo di v?i ti sau 1 thng.     Thank you for coming to see me today. Please do the following:  1. We increased your Trulicity today. Pick thsi up  From the pharmacy. Inject once a week.  2. Decrease your insulin to 25 units a day.  3. Continue checking blood sugars at home.   4. Continue making the lifestyle changes we've discussed together during our visit. Diet and exercise play a significant role in improving your blood sugars.  5. Follow-up with me in 1 month.

## 2019-11-07 NOTE — Progress Notes (Signed)
PCP: Bertram Denver, NP  S:    Patient arrives in good spirits. Presents for diabetes evaluation, education, and management. Patient was referred and last seen by Primary Care Provider on 10/09/2019. At that time, patient endorsed medication adherence, glucoses were near goal, and Trulicity was started. Patient has been seen by PharmD in the past for diabetes management.   Family/Social History: -FHx: HTN (mother, father) -Tobacco use: never smoker, denies smokeless tobacco -Alcohol use: denies  Insurance coverage/medication affordability: [employer]  Medication adherence reported. Of note, she has decreased her Lantus to 30 units daily. When asked why, pt reports that she made the decrease d/t blood sugars "getting lower". She has no confirmed objective hypoglycemia and denies symptoms.  Current diabetes medications include: lantus 40 units daily (self adjusted to 30 units daily for low sugars), Trulicity 0.75 mg weekly on Mondays, Farxiga 10 mg tablet daily Current hypertension medications include: amlodipine 10 mg daily, losartan-hydrochlorothiazide 100-25 mg daily, metoprolol tartrate 25 mg twice daily Current hyperlipidemia medications include: atorvastatin 40 mg daily  Patient denies hypoglycemic events.  Patient reported dietary habits: Eats 3 meals/day Breakfast: bread Lunch: soup, pourage Dinner/Afternoon Snack: soup or vegetables Drinks: water  Patient-reported exercise habits: walks throughout the day, undisclosed amount   Patient denies nocturia (nighttime urination).  Patient denies neuropathy (nerve pain). Patient denies visual changes. Patient denies self foot exams.    O:  POCT BG: 235 (one hour post-prandial)  Lab Results  Component Value Date   HGBA1C 10.7 (A) 10/09/2019   There were no vitals filed for this visit.  Lipid Panel     Component Value Date/Time   CHOL 144 03/07/2019 1048   TRIG 258 (H) 03/07/2019 1048   HDL 55 03/07/2019 1048    CHOLHDL 2.6 03/07/2019 1048   CHOLHDL 7.8 11/07/2018 1139   VLDL 26 11/07/2018 1139   LDLCALC 49 03/07/2019 1048   Home fasting blood sugars: 80-130 2 hour post-meal/random blood sugars: not checking because they are usually high when she does  Clinical Atherosclerotic Cardiovascular Disease (ASCVD): Yes  The ASCVD Risk score Denman George DC Jr., et al., 2013) failed to calculate for the following reasons:   The patient has a prior MI or stroke diagnosis    A/P: Diabetes longstanding currently uncontrolled. Patient is able to verbalize appropriate hypoglycemia management plan. Medication adherence appears optimal and patient is tolerating medications. Fasting glucoses reported today are at goal, 1-hour post prandial BG taken today is above goal. Patient not currently taking post-prandial glucoses because they are typically high. Will recommend twice daily glucose monitoring. Recent Trulicity has made good improvements on blood glucoses and insulin requirements have already decreased. Will increase dose of Trulicity and decrease basal insulin. Control is suboptimal due to inadequate medication coverage.   -Decreased dose of basal insulin Lantus (insulin glargine) to 25 units daily. Patient given instructions to decrease dose to 20 units daily if experiencing hypoglycemia. -Increased dose of GLP-1 Trulicity (generic name dulaglitide) to 1.5 mg weekly.  -Continued SGLT2-I Farxiga (generic namedapagliflozin) 10 mg daily. -Requested patient to check blood sugars twice daily.  -Extensively discussed pathophysiology of diabetes, recommended lifestyle interventions, dietary effects on blood sugar control -Counseled on s/sx of and management of hypoglycemia -Next A1C anticipated 12/2019.   ASCVD risk - secondary prevention in patient with diabetes. Last LDL is controlled. ASCVD risk score unable to be calculated.  - high intensity statin indicated. Aspirin is not indicated.  -Continued atorvastatin 40 mg  daily.   Health  maintenance: Latest labs revealed decreased kidney function, will recheck BMP today to evaluate kidney function and assess for medication appropriateness at that time.      Written patient instructions provided.  Total time in face to face counseling 30 minutes.   Follow up Pharmacist in one month.   Patient seen with  Levada Schilling PharmD Candidate, Class of 2022 [employer]  Butch Penny, PharmD, CPP Clinical Pharmacist [facility] [phone]

## 2019-11-08 LAB — BASIC METABOLIC PANEL
BUN/Creatinine Ratio: 26 (ref 12–28)
BUN: 28 mg/dL — ABNORMAL HIGH (ref 8–27)
CO2: 23 mmol/L (ref 20–29)
Calcium: 10.3 mg/dL (ref 8.7–10.3)
Chloride: 99 mmol/L (ref 96–106)
Creatinine, Ser: 1.06 mg/dL — ABNORMAL HIGH (ref 0.57–1.00)
GFR calc Af Amer: 62 mL/min/{1.73_m2} (ref >59)
GFR calc non Af Amer: 54 mL/min/{1.73_m2} — ABNORMAL LOW (ref >59)
Glucose: 192 mg/dL — ABNORMAL HIGH (ref 65–99)
Potassium: 4.6 mmol/L (ref 3.5–5.2)
Sodium: 139 mmol/L (ref 134–144)

## 2019-11-11 LAB — CUP PACEART REMOTE DEVICE CHECK
Date Time Interrogation Session: 20211025230552
Implantable Pulse Generator Implant Date: 20210409

## 2019-11-13 ENCOUNTER — Ambulatory Visit (INDEPENDENT_AMBULATORY_CARE_PROVIDER_SITE_OTHER): Payer: Medicare Other

## 2019-11-13 DIAGNOSIS — I639 Cerebral infarction, unspecified: Secondary | ICD-10-CM

## 2019-11-15 NOTE — Progress Notes (Signed)
Carelink Summary Report / Loop Recorder 

## 2019-12-11 ENCOUNTER — Encounter: Payer: Self-pay | Admitting: Pharmacist

## 2019-12-11 ENCOUNTER — Other Ambulatory Visit: Payer: Self-pay

## 2019-12-11 ENCOUNTER — Ambulatory Visit: Payer: Medicare Other | Attending: Nurse Practitioner | Admitting: Pharmacist

## 2019-12-11 DIAGNOSIS — Z794 Long term (current) use of insulin: Secondary | ICD-10-CM | POA: Diagnosis not present

## 2019-12-11 DIAGNOSIS — E1165 Type 2 diabetes mellitus with hyperglycemia: Secondary | ICD-10-CM | POA: Diagnosis not present

## 2019-12-11 MED ORDER — BENZONATATE 100 MG PO CAPS
100.0000 mg | ORAL_CAPSULE | Freq: Three times a day (TID) | ORAL | 0 refills | Status: DC | PRN
Start: 2019-12-11 — End: 2020-03-05

## 2019-12-11 MED FILL — TRULICITY 1.5 MG/0.5 ML PEN: 1.5 | 28 days supply | Qty: 2 | Fill #1

## 2019-12-11 MED FILL — LANTUS SOLOSTAR 100 UNITS/M: 100 | 84 days supply | Qty: 21 | Fill #0

## 2019-12-11 NOTE — Progress Notes (Signed)
    PCP: Bertram Denver, NP  S:    Patient arrives in good spirits. Presents for diabetes evaluation, education, and management. Patient was referred and last seen by Primary Care Provider on 10/09/2019. I saw her on 11/07/2019 and increased her Trulicity to 1.5 mg weekly. We also adjusted her insulin.  Today, she reports tolerating these changes well.   Family/Social History: -FHx: HTN (mother, father) -Tobacco use: never smoker, denies smokeless tobacco -Alcohol use: denies  Insurance coverage/medication affordability: [employer]  Medication adherence reported.  Current diabetes medications include: lantus 25 units daily, Trulicity 1.5 mg weekly on Mondays, Farxiga 10 mg tablet daily Current hypertension medications include: amlodipine 10 mg daily, losartan-hydrochlorothiazide 100-25 mg daily, metoprolol tartrate 25 mg twice daily Current hyperlipidemia medications include: atorvastatin 40 mg daily  Patient denies hypoglycemic events.  Patient reported dietary habits: Eats 3 meals/day Breakfast: bread Lunch: soup, pourage Dinner/Afternoon Snack: soup or vegetables Drinks: water  Patient-reported exercise habits: walks throughout the day, undisclosed amount   Patient denies nocturia (nighttime urination).  Patient denies neuropathy (nerve pain). Patient denies visual changes. Patient denies self foot exams.    O:   Lab Results  Component Value Date   HGBA1C 10.7 (A) 10/09/2019   There were no vitals filed for this visit.  Lipid Panel     Component Value Date/Time   CHOL 144 03/07/2019 1048   TRIG 258 (H) 03/07/2019 1048   HDL 55 03/07/2019 1048   CHOLHDL 2.6 03/07/2019 1048   CHOLHDL 7.8 11/07/2018 1139   VLDL 26 11/07/2018 1139   LDLCALC 49 03/07/2019 1048   Home fasting blood sugars: 80-130s - 1 outlier of 160 2 hour post-meal/random blood sugars: not checking because they are usually high when she does  Clinical Atherosclerotic Cardiovascular Disease  (ASCVD): Yes  The ASCVD Risk score Denman George DC Jr., et al., 2013) failed to calculate for the following reasons:   The patient has a prior MI or stroke diagnosis   A/P: Diabetes longstanding currently uncontrolled but home glycemic control has improved. Patient is able to verbalize appropriate hypoglycemia management plan. Medication adherence appears optimal. No changes today.  -Continue current regimen.  -Extensively discussed pathophysiology of diabetes, recommended lifestyle interventions, dietary effects on blood sugar control -Counseled on s/sx of and management of hypoglycemia -Next A1C anticipated 12/2019.   ASCVD risk - secondary prevention in patient with diabetes. Last LDL is controlled. ASCVD risk score unable to be calculated.  - high intensity statin indicated. Aspirin is not indicated.  -Continued atorvastatin 40 mg daily.   Written patient instructions provided.  Total time in face to face counseling 30 minutes.   Follow up w/ PCP next month.   Butch Penny, PharmD, CPP Clinical Pharmacist [facility] [phone]

## 2019-12-15 LAB — CUP PACEART REMOTE DEVICE CHECK
Date Time Interrogation Session: 20211127230429
Implantable Pulse Generator Implant Date: 20210409

## 2019-12-18 ENCOUNTER — Ambulatory Visit (INDEPENDENT_AMBULATORY_CARE_PROVIDER_SITE_OTHER): Payer: Medicare Other

## 2019-12-18 ENCOUNTER — Telehealth: Payer: Self-pay | Admitting: Nurse Practitioner

## 2019-12-18 DIAGNOSIS — I639 Cerebral infarction, unspecified: Secondary | ICD-10-CM

## 2019-12-18 NOTE — Telephone Encounter (Signed)
Called patient using the interpreter services line at both phone numbers to let patient know that their appointment for 12/28 is canceled due to Aimee Parker being out of the office. The home phone VM was not set up and the mobile VM was full so no message was able to be left.  If patient calls back please reschedule.

## 2019-12-28 NOTE — Progress Notes (Signed)
Carelink Summary Report / Loop Recorder 

## 2020-01-09 ENCOUNTER — Ambulatory Visit: Payer: Medicare Other | Admitting: Nurse Practitioner

## 2020-01-09 ENCOUNTER — Other Ambulatory Visit: Payer: Self-pay | Admitting: Nurse Practitioner

## 2020-01-09 DIAGNOSIS — I63119 Cerebral infarction due to embolism of unspecified vertebral artery: Secondary | ICD-10-CM

## 2020-01-09 MED FILL — METOPROLOL TARTRATE 25 MG T: 25 | 90 days supply | Qty: 180 | Fill #1

## 2020-01-09 MED FILL — ELIQUIS 5 MG TABLET: 5 | 90 days supply | Qty: 180 | Fill #0

## 2020-01-09 MED FILL — TRUEPLUS 5-BEVEL PEN NEEDLE: 31G X 5 MM | 90 days supply | Qty: 100 | Fill #1

## 2020-01-09 MED FILL — FARXIGA 10 MG TABLET: 10 | 90 days supply | Qty: 90 | Fill #1

## 2020-01-19 ENCOUNTER — Encounter: Payer: Self-pay | Admitting: Physical Therapy

## 2020-01-30 ENCOUNTER — Ambulatory Visit (INDEPENDENT_AMBULATORY_CARE_PROVIDER_SITE_OTHER): Payer: Medicare Other

## 2020-01-30 DIAGNOSIS — I639 Cerebral infarction, unspecified: Secondary | ICD-10-CM | POA: Diagnosis not present

## 2020-01-30 LAB — CUP PACEART REMOTE DEVICE CHECK
Date Time Interrogation Session: 20220108230428
Implantable Pulse Generator Implant Date: 20210409

## 2020-01-31 MED FILL — ATORVASTATIN CALCIUM 40 MG: 40 | 90 days supply | Qty: 90 | Fill #1

## 2020-01-31 MED FILL — LOSARTAN-HCTZ 100-25 MG TAB: 100-25 | 90 days supply | Qty: 90 | Fill #0

## 2020-01-31 MED FILL — TRULICITY 1.5 MG/0.5 ML PEN: 1.5 | 28 days supply | Qty: 2 | Fill #0

## 2020-01-31 MED FILL — AMLODIPINE BESYLATE 10 MG T: 10 | 90 days supply | Qty: 90 | Fill #0

## 2020-02-08 ENCOUNTER — Other Ambulatory Visit: Payer: Self-pay

## 2020-02-08 ENCOUNTER — Encounter: Payer: Self-pay | Admitting: Physical Medicine and Rehabilitation

## 2020-02-08 ENCOUNTER — Encounter
Payer: Medicare Other | Attending: Physical Medicine and Rehabilitation | Admitting: Physical Medicine and Rehabilitation

## 2020-02-08 VITALS — BP 153/78 | HR 79 | Temp 99.0°F | Ht 62.0 in | Wt 140.0 lb

## 2020-02-08 DIAGNOSIS — I639 Cerebral infarction, unspecified: Secondary | ICD-10-CM | POA: Insufficient documentation

## 2020-02-08 DIAGNOSIS — I63119 Cerebral infarction due to embolism of unspecified vertebral artery: Secondary | ICD-10-CM | POA: Insufficient documentation

## 2020-02-08 DIAGNOSIS — F5101 Primary insomnia: Secondary | ICD-10-CM | POA: Insufficient documentation

## 2020-02-08 DIAGNOSIS — I10 Essential (primary) hypertension: Secondary | ICD-10-CM | POA: Insufficient documentation

## 2020-02-08 NOTE — Progress Notes (Addendum)
Subjective:    Patient ID: Aimee Parker, female    DOB: Jul 09, 1950, 70 y.o.   MRN: [id number]  HPI  Aimee Parker presents for follow-up of her cryptogenic stroke.  1) Impaired mobility and ADLs following cryptogenic stroke: She is feeling great! She has been walking multiple times per day. She also swings her arms as she walks for arm exercise. Does not lift weights. Still has some subtle weakness.   2) HTN: Her BP is a little elevated this morning. She checks her blood pressure at home and it has been well controlled. Her BP is elevated to 153/78.   3) Diet: She eats a very healthy diet. She eats fish and vegetables. No other meat and low Na.   4) Citizenship: At the end of this year she plans to take a citizenship test this year and asks for a note regarding her hospitalization for this.   Pain Inventory Average Pain 4 Pain Right Now 4 My pain is no pain  In the last 24 hours, has pain interfered with the following? General activity 0 Relation with others 0 Enjoyment of life 0 What TIME of day is your pain at its worst? varies Sleep (in general) Good  Pain is worse with: some activites Pain improves with: medication Relief from Meds: 7  Family History  Problem Relation Age of Onset  . Hypertension Mother   . Hypertension Father   . Stroke Neg Hx    Social History   Socioeconomic History  . Marital status: Married    Spouse name: Not on file  . Number of children: Not on file  . Years of education: Not on file  . Highest education level: 9th grade  Occupational History  . Not on file  Tobacco Use  . Smoking status: Never Smoker  . Smokeless tobacco: Never Used  Vaping Use  . Vaping Use: Never used  Substance and Sexual Activity  . Alcohol use: No  . Drug use: No  . Sexual activity: Not Currently  Other Topics Concern  . Not on file  Social History Narrative   Lives with husband and 2 children   Says she is both R & L handed   Caffeine: none   Social  Determinants of Corporate investment banker Strain: Not on file  Food Insecurity: Not on file  Transportation Needs: Not on file  Physical Activity: Not on file  Stress: Not on file  Social Connections: Not on file   Past Surgical History:  Procedure Laterality Date  . EYE SURGERY     Left eye surgery  . implantable loop recorder implantation  04/20/2019   MDT Reveal [id number] [facility] [id number])  implanted by Dr Johney Frame by cryptogenic stroke (contact for remote monitoring is her son in law, cell [phone])  . PARS PLANA VITRECTOMY Right 05/13/2017   Procedure: PARS PLANA VITRECTOMY 25 GAUGE FOR ENDOPHTHALMITIS;  Surgeon: Edmon Crape, MD;  Location: [facility] OR;  Service: Ophthalmology;  Laterality: Right;   Past Surgical History:  Procedure Laterality Date  . EYE SURGERY     Left eye surgery  . implantable loop recorder implantation  04/20/2019   MDT Reveal [id number] [facility] [id number])  implanted by Dr Johney Frame by cryptogenic stroke (contact for remote monitoring is her son in law, cell [phone])  . PARS PLANA VITRECTOMY Right 05/13/2017   Procedure: PARS PLANA VITRECTOMY 25 GAUGE FOR ENDOPHTHALMITIS;  Surgeon: Edmon Crape, MD;  Location: [facility] OR;  Service: Ophthalmology;  Laterality: Right;  Past Medical History:  Diagnosis Date  . Diabetes mellitus without complication (HCC)   . Hyperlipidemia   . Hypertension   . Stroke (HCC)   . Vitamin D deficiency    Ht 5\' 2"  (1.575 m)   Wt 140 lb (63.5 kg)   BMI 25.61 kg/m   Opioid Risk Score:   Fall Risk Score:  `1  Depression screen PHQ 2/9  Depression screen [facility] 2/9 12/05/2018 01/17/2018 04/08/2017 09/09/2016 02/26/2016 12/19/2015 05/23/2015  Decreased Interest 0 0 0 0 2 0 0  Down, Depressed, Hopeless 0 0 0 0 0 0 0  PHQ - 2 Score 0 0 0 0 2 0 0  Altered sleeping 2 0 - - 0 - -  Tired, decreased energy 0 2 - - 1 - -  Change in appetite 0 0 - - 1 - -  Feeling bad or failure about yourself  0 0 - - 0 - -  Trouble concentrating 0 0 - - 0 - -   Moving slowly or fidgety/restless 0 0 - - 0 - -  Suicidal thoughts 0 0 - - 0 - -  PHQ-9 Score 2 2 - - 4 - -    Review of Systems  Constitutional: Negative.   HENT: Negative.   Eyes: Negative.   Respiratory: Negative.   Cardiovascular: Negative.   Gastrointestinal: Negative.   Endocrine: Negative.   Genitourinary: Negative.   Musculoskeletal: Positive for gait problem.  Skin: Negative.   Allergic/Immunologic: Negative.   Hematological: Negative.   Psychiatric/Behavioral: Negative.   All other systems reviewed and are negative.      Objective:   Physical Exam Gen: no distress, normal appearing HEENT: oral mucosa pink and moist, NCAT Cardio: Reg rate Chest: normal effort, normal rate of breathing. Palpable pacemaker Abd: soft, non-distended Ext: no edema Skin: intact Neuro: Alert and oriented x3 Musculoskeletal: 5/5 strength throughout.  Psych: pleasant, normal affect    Assessment & Plan:  Aimee Parker is a 70 year old woman who presents for follow-up after cryptogenic CVA. She is doing well with no complaints of falls, pain, depression, or constipation.She has been walking 2 hours per day! BP is uncontrolled this visit to 153/78.  1) Insomnia: To helphersleep better, I recommend chamomile tea at night, no screen time. She benefited from melatonin but no longer requires.  2) Left sided strength: She has completed therapy to work on regaining left-sided strength and cognitive communication deficit. Educated that she should work on her home exercise program and continue regular walking multiple times per day. Eventually may benefit from [facility] to improve balance.  3) Citizenship: She has not applied yet. Provided with note regarding her hospitalization to provide for her citizenship test.   4) Diet: Discussed her diet which is very healthy.  5) Impaired mobility and ADLs following cryptogenic stroke: Her strength and mobility are also much improved!  6) HTN: BP is  high in office but she does check it at home and reports good control at home. -Advised checking BP daily at home and logging results to bring into follow-up appointment with her PCP and myself. -Reviewed BP meds today.  -Advised regarding healthy foods that can help lower blood pressure and provided with a list: 1) citrus foods 2) salmon and other fatty fish 3) swiss chard (leafy green) 4) pumpkin seeds 5) Beans and lentils 6) Berries 7) Amaranth (whole grain, can be cooked similarly to rice and oats) 8) Pistachios 9) Carrots 10) Celery 11) Tomatoes 12) Broccoli 13) Greek yogurt  14) Herbs and spices: Celery seed, cilantro, saffron, lemongrass, black cumin, ginseng, cinnamon, cardamom, sweet basil, and ginger 15) Chia and flax seeds 16) Beets 17) spinach -Educated that goal BP is 120/80. -Made goal to incorporate some of the above foods into her diet.  -Interpreter translated these foods for her.  She recently had pacemaker placed and HR is currently well controlled in office.   Visit performed with aid of interpreter.

## 2020-02-08 NOTE — Patient Instructions (Signed)
-  Advised regarding healthy foods that can help lower blood pressure and provided with a list: 1) citrus foods 2) salmon and other fatty fish 3) swiss chard (leafy green) 4) pumpkin seeds 5) Beans and lentils 6) Berries 7) Amaranth (whole grain, can be cooked similarly to rice and oats) 8) Pistachios 9) Carrots 10) Celery 11) Tomatoes 12) Broccoli 13) Greek yogurt 14) Herbs and spices: Celery seed, cilantro, saffron, lemongrass, black cumin, ginseng, cinnamon, cardamom, sweet basil, and ginger 15) Chia and flax seeds 16) Beets 17) spinach -Educated that goal BP is 120/80. -Made goal to incorporate some of the above foods into her diet.  

## 2020-02-09 ENCOUNTER — Ambulatory Visit: Payer: Medicare Other | Attending: Nurse Practitioner | Admitting: Nurse Practitioner

## 2020-02-09 ENCOUNTER — Encounter: Payer: Self-pay | Admitting: Nurse Practitioner

## 2020-02-09 DIAGNOSIS — I10 Essential (primary) hypertension: Secondary | ICD-10-CM

## 2020-02-09 DIAGNOSIS — E781 Pure hyperglyceridemia: Secondary | ICD-10-CM

## 2020-02-09 DIAGNOSIS — Z794 Long term (current) use of insulin: Secondary | ICD-10-CM

## 2020-02-09 DIAGNOSIS — E1165 Type 2 diabetes mellitus with hyperglycemia: Secondary | ICD-10-CM

## 2020-02-09 NOTE — Progress Notes (Signed)
Virtual Visit via Telephone Note Due to national recommendations of social distancing due to COVID 19, telehealth visit is felt to be most appropriate for this patient at this time.  I discussed the limitations, risks, security and privacy concerns of performing an evaluation and management service by telephone and the availability of in person appointments. I also discussed with the patient that there may be a patient responsible charge related to this service. The patient expressed understanding and agreed to proceed.    I connected with Aimee Parker on 02/09/20  at   2:30 PM EST  EDT by telephone and verified that I am speaking with the correct person using two identifiers.   Consent I discussed the limitations, risks, security and privacy concerns of performing an evaluation and management service by telephone and the availability of in person appointments. I also discussed with the patient that there may be a patient responsible charge related to this service. The patient expressed understanding and agreed to proceed.   Location of Patient: Private Residence    Location of Provider: [facility]    Persons participating in Telemedicine visit: Bertram Denver FNP-BC YY Dundarrach CMA Francetta Found Akeema Broder interpreter [id number]   History of Present Illness: Telemedicine visit for: Follow up  has a past medical history of Diabetes mellitus without complication (HCC), Hyperlipidemia, Hypertension, Stroke [facility]), and Vitamin D deficiency.    DM 2 Home blood glucose level today fasting 101. LDL at goal with atorvastatin 40 mg daily. She is also taking for her diabetes: Trulicity 1.5mg  weekly, lantus 25 units daily, She denies any symptoms of hypo or hyperglycemia or statin intolerance. Lab Results  Component Value Date   HGBA1C 10.7 (A) 10/09/2019   Lab Results  Component Value Date   LDLCALC 49 03/07/2019   Essential Hypertension Home BP reading today:  143/79. Denies chest pain, shortness of breath, palpitations, lightheadedness, dizziness, headaches or BLE edema. Currently taking Hyzaar 100-25 mg daily, lopressor 25 mg BID and amlodipine 10 mg daily.  BP Readings from Last 3 Encounters:  02/08/20 (!) 153/78  10/09/19 (!) 154/67  09/07/19 136/71    Past Medical History:  Diagnosis Date  . Diabetes mellitus without complication (HCC)   . Hyperlipidemia   . Hypertension   . Stroke (HCC)   . Vitamin D deficiency     Past Surgical History:  Procedure Laterality Date  . EYE SURGERY     Left eye surgery  . implantable loop recorder implantation  04/20/2019   MDT Reveal [id number] [facility] [id number])  implanted by Dr Johney Frame by cryptogenic stroke (contact for remote monitoring is her son in law, cell [phone])  . PARS PLANA VITRECTOMY Right 05/13/2017   Procedure: PARS PLANA VITRECTOMY 25 GAUGE FOR ENDOPHTHALMITIS;  Surgeon: Edmon Crape, MD;  Location: [facility] OR;  Service: Ophthalmology;  Laterality: Right;    Family History  Problem Relation Age of Onset  . Hypertension Mother   . Hypertension Father   . Stroke Neg Hx     Social History   Socioeconomic History  . Marital status: Married    Spouse name: Not on file  . Number of children: Not on file  . Years of education: Not on file  . Highest education level: 9th grade  Occupational History  . Not on file  Tobacco Use  . Smoking status: Never Smoker  . Smokeless tobacco: Never Used  Vaping Use  . Vaping Use: Never used  Substance and Sexual Activity  .  Alcohol use: No  . Drug use: No  . Sexual activity: Not Currently  Other Topics Concern  . Not on file  Social History Narrative   Lives with husband and 2 children   Says she is both R & L handed   Caffeine: none   Social Determinants of Corporate investment banker Strain: Not on file  Food Insecurity: Not on file  Transportation Needs: Not on file  Physical Activity: Not on file  Stress: Not on file  Social  Connections: Not on file     Observations/Objective: Awake, alert and oriented x 3   Review of Systems  Constitutional: Negative for fever, malaise/fatigue and weight loss.  HENT: Negative.  Negative for nosebleeds.   Eyes: Negative.  Negative for blurred vision, double vision and photophobia.  Respiratory: Negative.  Negative for cough and shortness of breath.   Cardiovascular: Negative.  Negative for chest pain, palpitations and leg swelling.  Gastrointestinal: Negative.  Negative for heartburn, nausea and vomiting.  Musculoskeletal: Negative.  Negative for myalgias.  Neurological: Negative.  Negative for dizziness, focal weakness, seizures and headaches.  Psychiatric/Behavioral: Negative.  Negative for suicidal ideas.    Assessment and Plan: Kenady was seen today for follow-up.  Diagnoses and all orders for this visit:  Essential hypertension Continue all antihypertensives as prescribed.  Remember to bring in your blood pressure log with you for your follow up appointment.  DASH/Mediterranean Diets are healthier choices for HTN.    Type 2 diabetes mellitus with hyperglycemia, with long-term current use of insulin (HCC) Continue blood sugar control as discussed in office today, low carbohydrate diet, and regular physical exercise as tolerated, 150 minutes per week (30 min each day, 5 days per week, or 50 min 3 days per week). Keep blood sugar logs with fasting goal of 90-130 mg/dl, post prandial (after you eat) less than 180.  For Hypoglycemia: BS <60 and Hyperglycemia BS >400; contact the clinic ASAP. Annual eye exams and foot exams are recommended.   Hypertriglyceridemia INSTRUCTIONS: Work on a low fat, heart healthy diet and participate in regular aerobic exercise program by working out at least 150 minutes per week; 5 days a week-30 minutes per day. Avoid red meat/beef/steak,  fried foods. junk foods, sodas, sugary drinks, unhealthy snacking, alcohol and smoking.  Drink at  least 80 oz of water per day and monitor your carbohydrate intake daily.      Follow Up Instructions Return in about 4 weeks (around 03/08/2020) for BP recheck.     I discussed the assessment and treatment plan with the patient. The patient was provided an opportunity to ask questions and all were answered. The patient agreed with the plan and demonstrated an understanding of the instructions.   The patient was advised to call back or seek an in-person evaluation if the symptoms worsen or if the condition fails to improve as anticipated.  I provided 13 minutes of non-face-to-face time during this encounter including median intraservice time, reviewing previous notes, labs, imaging, medications and explaining diagnosis and management.  Claiborne Rigg, FNP-BC

## 2020-02-12 ENCOUNTER — Ambulatory Visit: Payer: Medicare Other | Attending: Nurse Practitioner

## 2020-02-12 ENCOUNTER — Other Ambulatory Visit: Payer: Self-pay | Admitting: Nurse Practitioner

## 2020-02-12 ENCOUNTER — Other Ambulatory Visit: Payer: Self-pay

## 2020-02-12 DIAGNOSIS — Z794 Long term (current) use of insulin: Secondary | ICD-10-CM

## 2020-02-12 DIAGNOSIS — E781 Pure hyperglyceridemia: Secondary | ICD-10-CM

## 2020-02-12 DIAGNOSIS — D72829 Elevated white blood cell count, unspecified: Secondary | ICD-10-CM | POA: Diagnosis not present

## 2020-02-12 DIAGNOSIS — I10 Essential (primary) hypertension: Secondary | ICD-10-CM

## 2020-02-12 DIAGNOSIS — E1165 Type 2 diabetes mellitus with hyperglycemia: Secondary | ICD-10-CM

## 2020-02-13 ENCOUNTER — Other Ambulatory Visit: Payer: Self-pay | Admitting: Nurse Practitioner

## 2020-02-13 DIAGNOSIS — D72829 Elevated white blood cell count, unspecified: Secondary | ICD-10-CM

## 2020-02-13 LAB — CBC
Hematocrit: 43.2 % (ref 34.0–46.6)
Hemoglobin: 13.7 g/dL (ref 11.1–15.9)
MCH: 22.5 pg — ABNORMAL LOW (ref 26.6–33.0)
MCHC: 31.7 g/dL (ref 31.5–35.7)
MCV: 71 fL — ABNORMAL LOW (ref 79–97)
Platelets: 314 10*3/uL (ref 150–450)
RBC: 6.09 x10E6/uL — ABNORMAL HIGH (ref 3.77–5.28)
RDW: 14.8 % (ref 11.7–15.4)
WBC: 11.4 10*3/uL — ABNORMAL HIGH (ref 3.4–10.8)

## 2020-02-13 LAB — CMP14+EGFR
ALT: 32 IU/L (ref 0–32)
AST: 36 IU/L (ref 0–40)
Albumin/Globulin Ratio: 1.3 (ref 1.2–2.2)
Albumin: 4.7 g/dL (ref 3.8–4.8)
Alkaline Phosphatase: 122 IU/L — ABNORMAL HIGH (ref 44–121)
BUN/Creatinine Ratio: 17 (ref 12–28)
BUN: 17 mg/dL (ref 8–27)
Bilirubin Total: 0.5 mg/dL (ref 0.0–1.2)
CO2: 25 mmol/L (ref 20–29)
Calcium: 9.9 mg/dL (ref 8.7–10.3)
Chloride: 98 mmol/L (ref 96–106)
Creatinine, Ser: 1.03 mg/dL — ABNORMAL HIGH (ref 0.57–1.00)
GFR calc Af Amer: 64 mL/min/{1.73_m2} (ref >59)
GFR calc non Af Amer: 56 mL/min/{1.73_m2} — ABNORMAL LOW (ref >59)
Globulin, Total: 3.6 g/dL (ref 1.5–4.5)
Glucose: 163 mg/dL — ABNORMAL HIGH (ref 65–99)
Potassium: 4.7 mmol/L (ref 3.5–5.2)
Sodium: 140 mmol/L (ref 134–144)
Total Protein: 8.3 g/dL (ref 6.0–8.5)

## 2020-02-13 LAB — LIPID PANEL
Chol/HDL Ratio: 4.4 ratio (ref 0.0–4.4)
Cholesterol, Total: 183 mg/dL (ref 100–199)
HDL: 42 mg/dL (ref >39)
LDL Chol Calc (NIH): 102 mg/dL — ABNORMAL HIGH (ref 0–99)
Triglycerides: 225 mg/dL — ABNORMAL HIGH (ref 0–149)
VLDL Cholesterol Cal: 39 mg/dL (ref 5–40)

## 2020-02-13 LAB — HEMOGLOBIN A1C
Est. average glucose Bld gHb Est-mCnc: 235 mg/dL
Hgb A1c MFr Bld: 9.8 % — ABNORMAL HIGH (ref 4.8–5.6)

## 2020-02-13 MED ORDER — EMPAGLIFLOZIN 10 MG PO TABS: 10.0000 mg | ORAL_TABLET | Freq: Every day | ORAL | 1 refills | Status: DC

## 2020-02-13 MED FILL — JARDIANCE 10 MG TABLET: 10 | 90 days supply | Qty: 90 | Fill #0

## 2020-02-13 NOTE — Progress Notes (Signed)
Carelink Summary Report / Loop Recorder 

## 2020-02-28 ENCOUNTER — Other Ambulatory Visit: Payer: Self-pay | Admitting: Nurse Practitioner

## 2020-02-28 DIAGNOSIS — D72829 Elevated white blood cell count, unspecified: Secondary | ICD-10-CM

## 2020-02-29 ENCOUNTER — Other Ambulatory Visit: Payer: Self-pay

## 2020-02-29 ENCOUNTER — Ambulatory Visit: Payer: Medicare Other | Attending: Nurse Practitioner

## 2020-02-29 DIAGNOSIS — D72829 Elevated white blood cell count, unspecified: Secondary | ICD-10-CM | POA: Diagnosis not present

## 2020-03-01 LAB — CBC WITH DIFFERENTIAL/PLATELET
Basophils Absolute: 0.1 10*3/uL (ref 0.0–0.2)
Basos: 1 %
EOS (ABSOLUTE): 0.3 10*3/uL (ref 0.0–0.4)
Eos: 3 %
Hematocrit: 43.4 % (ref 34.0–46.6)
Hemoglobin: 13.6 g/dL (ref 11.1–15.9)
Immature Grans (Abs): 0.1 10*3/uL (ref 0.0–0.1)
Immature Granulocytes: 1 %
Lymphocytes Absolute: 4.1 10*3/uL — ABNORMAL HIGH (ref 0.7–3.1)
Lymphs: 41 %
MCH: 22.3 pg — ABNORMAL LOW (ref 26.6–33.0)
MCHC: 31.3 g/dL — ABNORMAL LOW (ref 31.5–35.7)
MCV: 71 fL — ABNORMAL LOW (ref 79–97)
Monocytes Absolute: 0.5 10*3/uL (ref 0.1–0.9)
Monocytes: 5 %
Neutrophils Absolute: 4.8 10*3/uL (ref 1.4–7.0)
Neutrophils: 49 %
Platelets: 287 10*3/uL (ref 150–450)
RBC: 6.1 x10E6/uL — ABNORMAL HIGH (ref 3.77–5.28)
RDW: 16.4 % — ABNORMAL HIGH (ref 11.7–15.4)
WBC: 9.9 10*3/uL (ref 3.4–10.8)

## 2020-03-04 ENCOUNTER — Ambulatory Visit (INDEPENDENT_AMBULATORY_CARE_PROVIDER_SITE_OTHER): Payer: Medicare Other

## 2020-03-04 ENCOUNTER — Other Ambulatory Visit: Payer: Self-pay | Admitting: Nurse Practitioner

## 2020-03-04 ENCOUNTER — Other Ambulatory Visit: Payer: Self-pay

## 2020-03-04 ENCOUNTER — Encounter: Payer: Self-pay | Admitting: Nurse Practitioner

## 2020-03-04 ENCOUNTER — Ambulatory Visit: Payer: Medicare Other | Attending: Nurse Practitioner | Admitting: Nurse Practitioner

## 2020-03-04 VITALS — BP 133/66 | HR 81 | Temp 98.7°F | Ht 62.0 in | Wt 144.0 lb

## 2020-03-04 DIAGNOSIS — Z794 Long term (current) use of insulin: Secondary | ICD-10-CM | POA: Diagnosis not present

## 2020-03-04 DIAGNOSIS — E781 Pure hyperglyceridemia: Secondary | ICD-10-CM

## 2020-03-04 DIAGNOSIS — I6349 Cerebral infarction due to embolism of other cerebral artery: Secondary | ICD-10-CM | POA: Diagnosis not present

## 2020-03-04 DIAGNOSIS — Z780 Asymptomatic menopausal state: Secondary | ICD-10-CM

## 2020-03-04 DIAGNOSIS — E1165 Type 2 diabetes mellitus with hyperglycemia: Secondary | ICD-10-CM | POA: Diagnosis not present

## 2020-03-04 DIAGNOSIS — Z13820 Encounter for screening for osteoporosis: Secondary | ICD-10-CM

## 2020-03-04 DIAGNOSIS — Z1231 Encounter for screening mammogram for malignant neoplasm of breast: Secondary | ICD-10-CM

## 2020-03-04 DIAGNOSIS — D509 Iron deficiency anemia, unspecified: Secondary | ICD-10-CM

## 2020-03-04 LAB — POCT URINALYSIS DIP (CLINITEK)
Bilirubin, UA: NEGATIVE
Blood, UA: NEGATIVE
Glucose, UA: 500 mg/dL — AB
Ketones, POC UA: NEGATIVE mg/dL
Leukocytes, UA: NEGATIVE
Nitrite, UA: NEGATIVE
POC PROTEIN,UA: NEGATIVE
Spec Grav, UA: 1.01 (ref 1.010–1.025)
Urobilinogen, UA: 0.2 E.U./dL
pH, UA: 6 (ref 5.0–8.0)

## 2020-03-04 LAB — GLUCOSE, POCT (MANUAL RESULT ENTRY)
POC Glucose: 373 mg/dl — AB (ref 70–99)
POC Glucose: 285 mg/dl — AB (ref 70–99)

## 2020-03-04 MED ORDER — INSULIN ASPART 100 UNIT/ML ~~LOC~~ SOLN
20.0000 [IU] | Freq: Once | SUBCUTANEOUS | Status: AC
  Administered 2020-03-04: 20 [IU] via SUBCUTANEOUS

## 2020-03-04 MED ORDER — ATORVASTATIN CALCIUM 40 MG PO TABS: 40.0000 mg | ORAL_TABLET | Freq: Every day | ORAL | 0 refills | Status: DC

## 2020-03-04 MED ORDER — TRULICITY 1.5 MG/0.5ML ~~LOC~~ SOAJ: 1.5000 mg | SUBCUTANEOUS | 2 refills | Status: DC

## 2020-03-04 MED ORDER — EMPAGLIFLOZIN 10 MG PO TABS: 10.0000 mg | ORAL_TABLET | Freq: Every day | ORAL | 1 refills | Status: DC

## 2020-03-04 MED ORDER — BD PEN NEEDLE MINI U/F 31G X 5 MM MISC: 6 refills | Status: DC

## 2020-03-04 MED FILL — TRULICITY 1.5 MG/0.5 ML PEN: 1.5 | 28 days supply | Qty: 2 | Fill #0

## 2020-03-04 NOTE — Progress Notes (Signed)
Assessment & Plan:  Diagnoses and all orders for this visit:  Type 2 diabetes mellitus with hyperglycemia, with long-term current use of insulin (HCC) -     Glucose (CBG) -     empagliflozin (JARDIANCE) 10 MG TABS tablet; Take 1 tablet (10 mg total) by mouth daily before breakfast. -     POCT URINALYSIS DIP (CLINITEK) -     insulin aspart (novoLOG) injection 20 Units -     Glucose (CBG) -     Dulaglutide (TRULICITY) 1.5 HW/8.0SU SOPN; Inject 1.5 mg into the skin once a week. -     Insulin Pen Needle (B-D UF III MINI PEN NEEDLES) 31G X 5 MM MISC; Use as instructed. Inject into the skin once nightly. E11.65 Continue blood sugar control as discussed in office today, low carbohydrate diet, and regular physical exercise as tolerated, 150 minutes per week (30 min each day, 5 days per week, or 50 min 3 days per week). Keep blood sugar logs with fasting goal of 90-130 mg/dl, post prandial (after you eat) less than 180.  For Hypoglycemia: BS <60 and Hyperglycemia BS >400; contact the clinic ASAP. Annual eye exams and foot exams are recommended.  Postmenopausal estrogen deficiency -     DG Bone Density; Future  Osteoporosis screening -     DG Bone Density; Future  Hypertriglyceridemia -     atorvastatin (LIPITOR) 40 MG tablet; Take 1 tablet (40 mg total) by mouth daily. INSTRUCTIONS: Work on a low fat, heart healthy diet and participate in regular aerobic exercise program by working out at least 150 minutes per week; 5 days a week-30 minutes per day. Avoid red meat/beef/steak,  fried foods. junk foods, sodas, sugary drinks, unhealthy snacking, alcohol and smoking.  Drink at least 80 oz of water per day and monitor your carbohydrate intake daily.    Patient has been counseled on age-appropriate routine health concerns for screening and prevention. These are reviewed and up-to-date. Referrals have been placed accordingly. Immunizations are up-to-date or declined.    Subjective:  No chief  complaint on file.  HPI Aimee Parker 70 y.o. female presents to office today for follow up.  has a past medical history of Diabetes mellitus without complication ([place]), Hyperlipidemia, Hypertension, Stroke ([place]), and Vitamin D deficiency.  She has a past medical history of Diabetes mellitus without complication ([place]), Hyperlipidemia, Hypertension, Stroke ([place]), and Vitamin D deficiency.  DM2  Unfortunately she has been taking jardiance and farxiga together. I have instructed her to complete the farxiga and then complete the jardiance however do not take them together or at the same day.  She did verbalize understanding.. Blood glucose is >300 today. She drank tea with regular sugar this morning. She also had a bowl of rice and soup this morning. We had a discussion regarding carbohydrates and dietary modifications. Blood glucose readings on average: 120-130. She is monitoring her blood glucose in the am fasting. She is leaving  She also ran out of Truliclity 2 days ago. She has her meter with her today with averages  100-120.  At this time she will continue on Trulicity 1.5 mg weekly, Farxiga 10 mg daily, Lantus 25 units daily. LDL not at goal with atorvastatin 40 mg daily. Lab Results  Component Value Date   HGBA1C 9.8 (H) 02/12/2020   Lab Results  Component Value Date   LDLCALC 102 (H) 02/12/2020   Review of Systems  Constitutional: Negative for fever, malaise/fatigue and weight loss.  HENT: Negative.  Negative for nosebleeds.   Eyes: Negative.  Negative for blurred vision, double vision and photophobia.  Respiratory: Negative.  Negative for cough and shortness of breath.   Cardiovascular: Negative.  Negative for chest pain, palpitations and leg swelling.  Gastrointestinal: Negative.  Negative for heartburn, nausea and vomiting.  Musculoskeletal: Negative.  Negative for myalgias.  Neurological: Negative.  Negative for dizziness, focal weakness, seizures and headaches.   Psychiatric/Behavioral: Negative.  Negative for suicidal ideas.    Past Medical History:  Diagnosis Date  . Diabetes mellitus without complication ([place])   . Hyperlipidemia   . Hypertension   . Stroke ([place])   . Vitamin D deficiency     Past Surgical History:  Procedure Laterality Date  . EYE SURGERY     Left eye surgery  . implantable loop recorder implantation  04/20/2019   MDT Reveal [id number] [facility] [id number])  implanted by Dr Rayann Heman by cryptogenic stroke (contact for remote monitoring is her son in law, cell [phone])  . PARS PLANA VITRECTOMY Right 05/13/2017   Procedure: PARS PLANA VITRECTOMY 25 GAUGE FOR ENDOPHTHALMITIS;  Surgeon: Hurman Horn, MD;  Location: [facility];  Service: Ophthalmology;  Laterality: Right;    Family History  Problem Relation Age of Onset  . Hypertension Mother   . Hypertension Father   . Stroke Neg Hx     Social History Reviewed with no changes to be made today.   Outpatient Medications Prior to Visit  Medication Sig Dispense Refill  . amLODipine (NORVASC) 10 MG tablet Take 1 tablet (10 mg total) by mouth daily. 90 tablet 1  . benzonatate (TESSALON) 100 MG capsule Take 1-2 capsules (100-200 mg total) by mouth 3 (three) times daily as needed. 60 capsule 0  . Blood Glucose Monitoring Suppl (ACCU-CHEK GUIDE) w/Device KIT CHECK BLOOD SUGAR 3 4 TIMES DAILY    . ELIQUIS 5 MG TABS tablet TAKE 1 TABLET (5 MG TOTAL) BY MOUTH 2 (TWO) TIMES DAILY. 180 tablet 0  . glucose blood (ACCU-CHEK AVIVA) test strip Use as instructed. Check blood glucose levels before meals and at bedtime--by sticking your finger. 200 each 3  . insulin glargine (LANTUS) 100 UNIT/ML Solostar Pen Inject 25 Units into the skin daily. Please fill as a 90 day supply 15 mL 1  . losartan-hydrochlorothiazide (HYZAAR) 100-25 MG tablet Take 1 tablet by mouth daily. Please fill as a 90 day supply 90 tablet 0  . Dulaglutide (TRULICITY) 1.5 VO/5.3GU SOPN Inject 1.5 mg into the skin once a week. 6 mL 2   . empagliflozin (JARDIANCE) 10 MG TABS tablet Take 1 tablet (10 mg total) by mouth daily before breakfast. 90 tablet 1  . Insulin Pen Needle (B-D UF III MINI PEN NEEDLES) 31G X 5 MM MISC Use as instructed. Inject into the skin once nightly. E11.65 200 each 6  . Accu-Chek Softclix Lancets lancets Use as instructed. Check blood glucose levels twice per day by fingerstick. E11.65 200 each 6  . acetaminophen (TYLENOL) 325 MG tablet Take 1-2 tablets (325-650 mg total) by mouth every 4 (four) hours as needed for mild pain.    . metoprolol tartrate (LOPRESSOR) 25 MG tablet Take 1 tablet (25 mg total) by mouth 2 (two) times daily. Please fill as a 90 day supply 180 tablet 1  . Vitamin D, Ergocalciferol, (DRISDOL) 1.25 MG (50000 UNIT) CAPS capsule Take 1 capsule (50,000 Units total) by mouth every 7 (seven) days. (Patient not taking: Reported on 03/04/2020) 12 capsule 3  . atorvastatin (LIPITOR) 40  MG tablet Take 1 tablet (40 mg total) by mouth daily. Please fill as a 90 day supply 90 tablet 1   No facility-administered medications prior to visit.    No Known Allergies     Objective:    BP 133/66 (BP Location: Left Arm, Patient Position: Sitting, Cuff Size: Normal)   Pulse 81   Temp 98.7 F (37.1 C) (Oral)   Ht 5' 2"  (1.575 m)   Wt 144 lb (65.3 kg)   SpO2 98%   BMI 26.34 kg/m  Wt Readings from Last 3 Encounters:  03/04/20 144 lb (65.3 kg)  02/08/20 140 lb (63.5 kg)  10/09/19 141 lb (64 kg)    Physical Exam Vitals and nursing note reviewed.  Constitutional:      Appearance: She is well-developed and well-nourished.  HENT:     Head: Normocephalic and atraumatic.  Eyes:     Extraocular Movements: EOM normal.  Cardiovascular:     Rate and Rhythm: Normal rate and regular rhythm.     Pulses: Intact distal pulses.     Heart sounds: Normal heart sounds. No murmur heard. No friction rub. No gallop.   Pulmonary:     Effort: Pulmonary effort is normal. No tachypnea or respiratory distress.      Breath sounds: Normal breath sounds. No decreased breath sounds, wheezing, rhonchi or rales.  Chest:     Chest wall: No tenderness.  Abdominal:     General: Bowel sounds are normal.     Palpations: Abdomen is soft.  Musculoskeletal:        General: No edema. Normal range of motion.     Cervical back: Normal range of motion.  Skin:    General: Skin is warm and dry.  Neurological:     Mental Status: She is alert and oriented to person, place, and time.     Coordination: Coordination normal.  Psychiatric:        Mood and Affect: Mood and affect normal.        Behavior: Behavior normal. Behavior is cooperative.        Thought Content: Thought content normal.        Judgment: Judgment normal.          Patient has been counseled extensively about nutrition and exercise as well as the importance of adherence with medications and regular follow-up. The patient was given clear instructions to go to ER or return to medical center if symptoms don't improve, worsen or new problems develop. The patient verbalized understanding.   Follow-up: Return in about 3 months (around 06/01/2020) for needs labs on April 12th.   Gildardo Pounds, FNP-BC [facility] [place], [place]   03/04/2020, 11:57 AM

## 2020-03-05 ENCOUNTER — Encounter: Payer: Self-pay | Admitting: Adult Health

## 2020-03-05 ENCOUNTER — Ambulatory Visit: Payer: Medicare Other | Admitting: Physical Medicine and Rehabilitation

## 2020-03-05 ENCOUNTER — Ambulatory Visit (INDEPENDENT_AMBULATORY_CARE_PROVIDER_SITE_OTHER): Payer: Medicare Other | Admitting: Adult Health

## 2020-03-05 VITALS — BP 146/69 | HR 70 | Ht 59.0 in | Wt 144.0 lb

## 2020-03-05 DIAGNOSIS — I480 Paroxysmal atrial fibrillation: Secondary | ICD-10-CM | POA: Diagnosis not present

## 2020-03-05 DIAGNOSIS — I10 Essential (primary) hypertension: Secondary | ICD-10-CM

## 2020-03-05 DIAGNOSIS — E785 Hyperlipidemia, unspecified: Secondary | ICD-10-CM

## 2020-03-05 DIAGNOSIS — I6310 Cerebral infarction due to embolism of unspecified precerebral artery: Secondary | ICD-10-CM | POA: Diagnosis not present

## 2020-03-05 NOTE — Patient Instructions (Signed)
Continue to do exercises at home to keep current level of functioning   Continue aspirin 81 mg daily and Eliquis (apixaban) daily  and atorvastatin 40mg  daily  for secondary stroke prevention  Continue to follow up with PCP regarding cholesterol, blood pressure and diabetes management  Maintain strict control of hypertension with blood pressure goal below 130/90, diabetes with hemoglobin A1c goal below 7% and cholesterol with LDL cholesterol (bad cholesterol) goal below 70 mg/dL.       Followup in the future with me in 6 months or call earlier if needed       Thank you for coming to see at [facility]. I hope we have been able to provide you high quality care today.  You may receive a patient satisfaction survey over the next few weeks. We would appreciate your feedback and comments so that we may continue to improve ourselves and the health of our patients.

## 2020-03-05 NOTE — Progress Notes (Addendum)
[id number]    Provider:  Dr Jaynee Eagles Requesting Provider: Gildardo Pounds, NP Primary Care Provider:  Gildardo Pounds, NP  CC:  Embolic stroke  HPI:  Today, 03/05/2020, Aimee Parker returns for 61-monthstroke follow-up accompanied by interpreter.  Reports she has been doing well since prior visit Reports residual gait impairment and mild imbalance - reports improvement since prior visit. She does exercise routinely at home daily as well as walking daily Denies new stroke/TIA symptoms  Reports compliance on aspirin and apixaban -denies side effects Reports compliance on atorvastatin 40 mg daily -denies side effects Blood pressure today 146/69 - monitors at home which has been stable Recent A1c 9.8 01/2020 (down from 10.7) Recent lipid panel showed LDL 102 (01/2020)    History provided for reference purposes only Update 09/07/2019 JM: Ms. NRiccoreturns for stroke follow accompanied by interpreter She has been doing well since prior visit with residual gait impairment but has been improving. She ambulates without assistive device and denies any recent falls denies new or worsening stroke/TIA symptoms Evaluated by Dr. ARayann Hemanand had loop recorder placed on 04/21/2019 to further assess for atrial fibrillation.  Loop recorder has not shown atrial fibrillation thus far but did have A. fib with RVR during admission and placed on apixaban. Remains on apixaban and aspirin for secondary stroke prevention and atrial fibrillation without bleeding or bruising Continues on atorvastatin 40 mg daily without myalgias Blood pressure today 136/71 Glucose levels stable per patient.  A1c 12.0 (6/25) PCP manages HTN, HLD and DM  Initial consult 03/09/2019 Dr. AJaynee Eagles  Aimee BoydenTDanikah Budzikis a 70y.o. female here as requested by FGildardo Pounds NP for embolic stroke, discharged from the hospital 11/15/2018. PMHx DM and HTN who presented to the ED with altered mentation.  She had a history of 2  to 3-days of fever, fatigue and some abdominal pain.  MRI showed multiple areas of stroke concerning for septic emboli given her fevers and positive blood cultures.  On examination she had right-sided weakness and left-sided gaze.  MRI of the brain showed multiple areas of stroke in the callosal body, splenium and left gyrus, left globus pallidus, CTA of the head and neck showed no etiology for emboli, 2D echo showed normal ejection fraction, possibly a left to right PFO however TEE was negative.  Hemoglobin A1c was uncontrolled 8.1 LDL was 97.  She was not on antiplatelets prior to admission.  She is also a cigarette smoker.  Infectious problems included pneumonia, E. coli bacteremia with positive blood cultures, fevers, acute kidney injury, acute respiratory failure intubated on ventilator support, sepsis.  TEE was negative for vegetations or PFO.  She was placed on aspirin and Plavix for 3 weeks followed by aspirin alone.  Today she is here for follow-up. She is here with an interpreter and feels she is doing well. The alter mental status is resolved. She says her weakness is better. She is walking. I had a long discussion about afib, difficult via interpreter, and the risk for afib and using a loop recorder. The stroke may have been embolic due to sepsis or she may have had afib due to sepsis prior to admission but I do feel an evaluation with Dr. ARayann Hemanwould be prudent. She is taking aspirin not the plavix. She has graduated from PT and feels great. No other focal neurologic deficits, associated symptoms, inciting events or modifiable factors.  Review of Systems: Patient complains of symptoms per HPI as well as  the following symptoms: stroke Pertinent negatives and positives per HPI. All others negative.   Social History   Socioeconomic History  . Marital status: Married    Spouse name: Not on file  . Number of children: Not on file  . Years of education: Not on file  . Highest education level:  9th grade  Occupational History  . Not on file  Tobacco Use  . Smoking status: Never Smoker  . Smokeless tobacco: Never Used  Vaping Use  . Vaping Use: Never used  Substance and Sexual Activity  . Alcohol use: No  . Drug use: No  . Sexual activity: Not Currently  Other Topics Concern  . Not on file  Social History Narrative   Lives with husband and 2 children   Says she is both R & L handed   Caffeine: none   Social Determinants of Radio broadcast assistant Strain: Not on file  Food Insecurity: Not on file  Transportation Needs: Not on file  Physical Activity: Not on file  Stress: Not on file  Social Connections: Not on file  Intimate Partner Violence: Not on file    Family History  Problem Relation Age of Onset  . Hypertension Mother   . Hypertension Father   . Stroke Neg Hx     Past Medical History:  Diagnosis Date  . Diabetes mellitus without complication ([place])   . Hyperlipidemia   . Hypertension   . Stroke ([place])   . Vitamin D deficiency     Patient Active Problem List   Diagnosis Date Noted  . Cryptogenic stroke ([place]) 04/21/2019  . Dependence on respirator (ventilator) status ([place]) 03/07/2019  . Hemiplegia and hemiparesis following cerebral infarction affecting left non-dominant side ([place]) 03/07/2019  . Type 2 diabetes mellitus with hyperglycemia, with long-term current use of insulin ([place]) 03/07/2019  . Paroxysmal atrial fibrillation ([place]) 03/07/2019  . DM (diabetes mellitus), type 2 ([place]) 11/15/2018  . Embolic stroke ([place]) 71/24/5809  . Urinary tract infection without hematuria   . E coli bacteremia 11/08/2018  . Cerebral embolism with cerebral infarction 11/07/2018  . Pneumonia   . Pyelonephritis   . Endotracheal tube present   . Septic shock ([place])   . Sepsis ([place]) 11/01/2018  . Acute respiratory failure ([place]) 11/01/2018  . Endophthalmitis, acute 05/13/2017  . Healthcare maintenance 09/09/2016  . DM (diabetes mellitus) type 2, uncontrolled,  with ketoacidosis ([place]) 05/17/2014  . Heart murmur 12/14/2013  . Cough 07/06/2013  . Hypertriglyceridemia 07/06/2013  . Type 2 diabetes mellitus without complication, with long-term current use of insulin ([place]) 07/06/2013  . Essential hypertension, benign 12/22/2012  . Hyperglycemia 11/17/2012  . Hypertension, uncontrolled 11/17/2012    Past Surgical History:  Procedure Laterality Date  . EYE SURGERY     Left eye surgery  . implantable loop recorder implantation  04/20/2019   MDT Reveal [id number] [facility] [id number])  implanted by Dr Rayann Heman by cryptogenic stroke (contact for remote monitoring is her son in law, cell [phone])  . PARS PLANA VITRECTOMY Right 05/13/2017   Procedure: PARS PLANA VITRECTOMY 25 GAUGE FOR ENDOPHTHALMITIS;  Surgeon: Hurman Horn, MD;  Location: [facility];  Service: Ophthalmology;  Laterality: Right;    Current Outpatient Medications  Medication Sig Dispense Refill  . Accu-Chek Softclix Lancets lancets Use as instructed. Check blood glucose levels twice per day by fingerstick. E11.65 200 each 6  . acetaminophen (TYLENOL) 325 MG tablet Take 1-2 tablets (325-650 mg total) by mouth every 4 (four) hours as  needed for mild pain.    Marland Kitchen amLODipine (NORVASC) 10 MG tablet Take 1 tablet (10 mg total) by mouth daily. 90 tablet 1  . atorvastatin (LIPITOR) 40 MG tablet Take 1 tablet (40 mg total) by mouth daily. 30 tablet 0  . Blood Glucose Monitoring Suppl (ACCU-CHEK GUIDE) w/Device KIT CHECK BLOOD SUGAR 3 4 TIMES DAILY    . Dulaglutide (TRULICITY) 1.5 IO/0.3TD SOPN Inject 1.5 mg into the skin once a week. 6 mL 2  . ELIQUIS 5 MG TABS tablet TAKE 1 TABLET (5 MG TOTAL) BY MOUTH 2 (TWO) TIMES DAILY. 180 tablet 0  . empagliflozin (JARDIANCE) 10 MG TABS tablet Take 1 tablet (10 mg total) by mouth daily before breakfast. 90 tablet 1  . glucose blood (ACCU-CHEK AVIVA) test strip Use as instructed. Check blood glucose levels before meals and at bedtime--by sticking your finger. 200 each 3  .  insulin glargine (LANTUS) 100 UNIT/ML Solostar Pen Inject 25 Units into the skin daily. Please fill as a 90 day supply 15 mL 1  . Insulin Pen Needle (B-D UF III MINI PEN NEEDLES) 31G X 5 MM MISC Use as instructed. Inject into the skin once nightly. E11.65 200 each 6  . losartan-hydrochlorothiazide (HYZAAR) 100-25 MG tablet Take 1 tablet by mouth daily. Please fill as a 90 day supply 90 tablet 0  . metoprolol tartrate (LOPRESSOR) 25 MG tablet Take 1 tablet (25 mg total) by mouth 2 (two) times daily. Please fill as a 90 day supply 180 tablet 1   No current facility-administered medications for this visit.    Allergies as of 03/05/2020  . (No Known Allergies)    Vitals: Today's Vitals   03/05/20 0720  BP: (!) 146/69  Pulse: 70  Weight: 144 lb (65.3 kg)  Height: 4' 11"  (1.499 m)   Body mass index is 29.08 kg/m.  General: well developed, well nourished, very pleasant middle-aged female, seated, in no evident distress Head: head normocephalic and atraumatic.   Neck: supple with no carotid or supraclavicular bruits Cardiovascular: regular rate and rhythm, no murmurs Musculoskeletal: no deformity Skin:  no rash/petichiae Vascular:  Normal pulses all extremities   Neurologic Exam Mental Status: Awake and fully alert.  Difficulty assessing language or speech due to primary language of [place] but denies speech or language difficulty.  Oriented to place and time. Recent and remote memory intact. Attention span, concentration and fund of knowledge appropriate. Mood and affect appropriate.  Cranial Nerves: Pupils equal, briskly reactive to light. Extraocular movements full without nystagmus. Visual fields full to confrontation. Hearing intact. Facial sensation intact. Face, tongue, palate moves normally and symmetrically.  Motor: Normal bulk and tone. Normal strength in all tested extremity muscles. Sensory.: intact to touch , pinprick , position and vibratory sensation.  Coordination:  Rapid alternating movements normal in all extremities. Finger-to-nose and heel-to-shin performed accurately bilaterally. Gait and Station: Arises from chair without difficulty. Stance is normal. Gait demonstrates  short shuffled steps upon initiating gait but shortly transitions normal stride and adequate balance.  Mild difficulty with tandem walk.  Romberg negative. Reflexes: 1+ and symmetric. Toes downgoing.    Assessment/Plan:  70 y.o. female here as requested by Gildardo Pounds, NP for embolic stroke after presenting to ED with altered mentation, discharged from the hospital 11/15/2018. MRI showed multiple areas of stroke concerning for septic emboli given her fevers and positive blood cultures.  On examination she had right-sided weakness and left-sided gaze.  MRI of the brain showed multiple areas  of stroke in the callosal body, splenium and left gyrus, left globus pallidus.  A. fib with RVR documented during admission and initiate apixaban.  Loop recorder placed in 04/2019 by Dr. Rayann Heman for further evaluation of atrial fibrillation.  Residual gait impairment and occasional imbalance post stroke -gradually improving Continue aspirin and apixaban for atrial fibrillation and secondary stroke prevention Continue atorvastatin 40 mg daily for HLD management -repeat lipid panel today Ensure close PCP follow-up for aggressive stroke risk factor management including HTN with BP<130/90, HLD with LDL goal<70 and DM with A1c goal<7.0. Continue to follow with cardiology for atrial fibrillation management and monitoring of loop recorder   Follow-up in 6 months or call earlier if needed  CC:  [facility] provider: Dr. Amalia Greenhouse, Vernia Buff, NP    I spent 30 minutes of face-to-face and non-face-to-face time with patient assisted by interpreter.  This included previsit chart review, lab review, study review, order entry, electronic health record documentation, patient education regarding prior stroke and review of  loop recorder monthly reports, residual deficits, discussion regarding secondary stroke prevention measures and answered all other questions to patient satisfaction   Frann Rider, [facility]  [facility] [address] [place], [place] [address]  Phone [phone] Fax [phone] Note: This document was prepared with digital dictation and possible smart phrase technology. Any transcriptional errors that result from this process are unintentional.  Made any corrections needed, and agree with history, physical, neuro exam,assessment and plan as stated.     Sarina Ill, MD [facility]

## 2020-03-06 ENCOUNTER — Other Ambulatory Visit: Payer: Self-pay | Admitting: Adult Health

## 2020-03-06 DIAGNOSIS — E781 Pure hyperglyceridemia: Secondary | ICD-10-CM

## 2020-03-06 LAB — LIPID PANEL
Chol/HDL Ratio: 4.2 ratio (ref 0.0–4.4)
Cholesterol, Total: 202 mg/dL — ABNORMAL HIGH (ref 100–199)
HDL: 48 mg/dL (ref >39)
LDL Chol Calc (NIH): 113 mg/dL — ABNORMAL HIGH (ref 0–99)
Triglycerides: 237 mg/dL — ABNORMAL HIGH (ref 0–149)
VLDL Cholesterol Cal: 41 mg/dL — ABNORMAL HIGH (ref 5–40)

## 2020-03-06 LAB — CUP PACEART REMOTE DEVICE CHECK
Date Time Interrogation Session: 20220220230544
Implantable Pulse Generator Implant Date: 20210409

## 2020-03-06 MED ORDER — ATORVASTATIN CALCIUM 80 MG PO TABS: 80.0000 mg | ORAL_TABLET | Freq: Every day | ORAL | 0 refills | Status: DC

## 2020-03-06 MED FILL — ATORVASTATIN CALCIUM 80 MG: 80 | 90 days supply | Qty: 90 | Fill #0

## 2020-03-07 ENCOUNTER — Telehealth: Payer: Self-pay | Admitting: *Deleted

## 2020-03-07 ENCOUNTER — Ambulatory Visit: Payer: Medicare Other | Admitting: Physical Medicine and Rehabilitation

## 2020-03-07 NOTE — Telephone Encounter (Signed)
-----   Message from Ihor Austin, NP sent at 03/06/2020 11:48 AM EST ----- Please advise son in law (under demographics as he speaks [place]) that patient's fasting cholesterol levels are uncontrolled.  She did verify taking atorvastatin at yesterday's visit therefore recommend increasing dosage to atorvastatin 80 mg daily.  A new prescription will be placed and ongoing refills will need to be obtained by PCP as well as repeat lipid panel in the next 3 to 4 months

## 2020-03-07 NOTE — Telephone Encounter (Signed)
I called pt and thru vietnamese interpreter, ALEX [phone], she was given the results of labs.  Will start on atorvastatin 80mg  po daily..  F/u in 3-4 months with pcp and recheck labs.  She has appt 05-05-20 approx with pcp.  I called and spoke to pharmacist and she stated that is ready for pt.  I relayed she will come by and pick up.  I tried to call son in law , could not LVM.  (not set up)

## 2020-03-07 NOTE — Telephone Encounter (Signed)
Called son-in-law to give labs results, no answer and voice MB full.

## 2020-03-12 ENCOUNTER — Telehealth: Payer: Self-pay | Admitting: Nurse Practitioner

## 2020-03-12 ENCOUNTER — Other Ambulatory Visit: Payer: Self-pay | Admitting: Pharmacist

## 2020-03-12 DIAGNOSIS — E1165 Type 2 diabetes mellitus with hyperglycemia: Secondary | ICD-10-CM

## 2020-03-12 MED ORDER — INSULIN GLARGINE 100 UNIT/ML SOLOSTAR PEN: 25.0000 [IU] | PEN_INJECTOR | Freq: Every day | SUBCUTANEOUS | 1 refills | Status: DC

## 2020-03-12 MED FILL — LANTUS SOLOSTAR 100 UNITS/M: 100 | 84 days supply | Qty: 21 | Fill #0

## 2020-03-12 NOTE — Progress Notes (Signed)
Carelink Summary Report / Loop Recorder 

## 2020-03-12 NOTE — Telephone Encounter (Signed)
Patient came into the office today asking for refills of insulin. Connected with Franky Macho while patient was in office. Luke sent refills.

## 2020-03-13 ENCOUNTER — Ambulatory Visit: Payer: Medicare Other | Admitting: Adult Health

## 2020-03-13 DIAGNOSIS — H11042 Peripheral pterygium, stationary, left eye: Secondary | ICD-10-CM | POA: Diagnosis not present

## 2020-03-13 DIAGNOSIS — Z961 Presence of intraocular lens: Secondary | ICD-10-CM | POA: Diagnosis not present

## 2020-03-13 DIAGNOSIS — H179 Unspecified corneal scar and opacity: Secondary | ICD-10-CM | POA: Diagnosis not present

## 2020-03-13 DIAGNOSIS — E119 Type 2 diabetes mellitus without complications: Secondary | ICD-10-CM | POA: Diagnosis not present

## 2020-04-07 LAB — CUP PACEART REMOTE DEVICE CHECK
Date Time Interrogation Session: 20220325230621
Implantable Pulse Generator Implant Date: 20210409

## 2020-04-08 ENCOUNTER — Ambulatory Visit (INDEPENDENT_AMBULATORY_CARE_PROVIDER_SITE_OTHER): Payer: Medicare Other

## 2020-04-08 DIAGNOSIS — I639 Cerebral infarction, unspecified: Secondary | ICD-10-CM

## 2020-04-13 ENCOUNTER — Other Ambulatory Visit: Payer: Self-pay

## 2020-04-19 NOTE — Progress Notes (Signed)
Carelink Summary Report / Loop Recorder 

## 2020-04-23 ENCOUNTER — Ambulatory Visit: Payer: Medicare Other | Attending: Nurse Practitioner

## 2020-04-23 ENCOUNTER — Ambulatory Visit
Admission: RE | Admit: 2020-04-23 | Discharge: 2020-04-23 | Disposition: A | Discharge status: Home / Self Care | Payer: Medicare Other | Source: Ambulatory Visit | Attending: Nurse Practitioner | Admitting: Nurse Practitioner

## 2020-04-23 ENCOUNTER — Other Ambulatory Visit: Payer: Self-pay

## 2020-04-23 ENCOUNTER — Other Ambulatory Visit: Payer: Self-pay | Admitting: Nurse Practitioner

## 2020-04-23 ENCOUNTER — Other Ambulatory Visit: Payer: Self-pay | Admitting: Pharmacist

## 2020-04-23 ENCOUNTER — Other Ambulatory Visit: Payer: Self-pay | Admitting: Adult Health

## 2020-04-23 DIAGNOSIS — E1110 Type 2 diabetes mellitus with ketoacidosis without coma: Secondary | ICD-10-CM

## 2020-04-23 DIAGNOSIS — E781 Pure hyperglyceridemia: Secondary | ICD-10-CM

## 2020-04-23 DIAGNOSIS — E1165 Type 2 diabetes mellitus with hyperglycemia: Secondary | ICD-10-CM

## 2020-04-23 DIAGNOSIS — Z1211 Encounter for screening for malignant neoplasm of colon: Secondary | ICD-10-CM

## 2020-04-23 DIAGNOSIS — I63119 Cerebral infarction due to embolism of unspecified vertebral artery: Secondary | ICD-10-CM

## 2020-04-23 DIAGNOSIS — D509 Iron deficiency anemia, unspecified: Secondary | ICD-10-CM | POA: Diagnosis not present

## 2020-04-23 DIAGNOSIS — Z1231 Encounter for screening mammogram for malignant neoplasm of breast: Secondary | ICD-10-CM

## 2020-04-23 DIAGNOSIS — I10 Essential (primary) hypertension: Secondary | ICD-10-CM

## 2020-04-23 DIAGNOSIS — Z794 Long term (current) use of insulin: Secondary | ICD-10-CM

## 2020-04-23 MED ORDER — METOPROLOL TARTRATE 25 MG PO TABS
ORAL_TABLET | Freq: Two times a day (BID) | ORAL | 1 refills | Status: AC
  Filled 2020-04-23: qty 180, 90d supply, fill #0

## 2020-04-23 MED ORDER — INSULIN GLARGINE 100 UNIT/ML SOLOSTAR PEN
25.0000 [IU] | PEN_INJECTOR | Freq: Every day | SUBCUTANEOUS | 1 refills | Status: AC
  Filled 2020-04-23 – 2020-04-24 (×4): qty 15, 60d supply, fill #0
  Filled 2020-04-30: qty 15, 60d supply, fill #1

## 2020-04-23 MED ORDER — ATORVASTATIN CALCIUM 80 MG PO TABS
ORAL_TABLET | Freq: Every day | ORAL | 0 refills | Status: DC
  Filled 2020-04-23: qty 90, 90d supply, fill #0

## 2020-04-23 MED ORDER — DAPAGLIFLOZIN PROPANEDIOL 10 MG PO TABS
ORAL_TABLET | Freq: Every day | ORAL | 1 refills | Status: AC
  Filled 2020-04-23: qty 90, 90d supply, fill #0

## 2020-04-23 MED ORDER — DULAGLUTIDE 1.5 MG/0.5ML ~~LOC~~ SOAJ: 1.5000 mg | SUBCUTANEOUS | 2 refills | Status: AC

## 2020-04-23 MED ORDER — LOSARTAN POTASSIUM-HCTZ 100-25 MG PO TABS
1.0000 | ORAL_TABLET | Freq: Every day | ORAL | 0 refills | Status: DC
  Filled 2020-04-23: qty 90, 90d supply, fill #0

## 2020-04-23 MED ORDER — APIXABAN 5 MG PO TABS
ORAL_TABLET | Freq: Two times a day (BID) | ORAL | 0 refills | Status: DC
  Filled 2020-04-23: qty 180, 90d supply, fill #0

## 2020-04-23 MED FILL — Empagliflozin Tab 10 MG: ORAL | 30 days supply | Qty: 30 | Fill #0 | Status: AC

## 2020-04-23 MED FILL — Dulaglutide Soln Auto-injector 1.5 MG/0.5ML: SUBCUTANEOUS | 28 days supply | Qty: 2 | Fill #0 | Status: AC

## 2020-04-23 MED FILL — Amlodipine Besylate Tab 10 MG (Base Equivalent): ORAL | 90 days supply | Qty: 90 | Fill #0 | Status: AC

## 2020-04-23 MED FILL — Insulin Pen Needle 31 G X 5 MM (1/5" or 3/16"): 30 days supply | Qty: 200 | Fill #0 | Status: AC

## 2020-04-24 ENCOUNTER — Other Ambulatory Visit: Payer: Self-pay | Admitting: Nurse Practitioner

## 2020-04-24 ENCOUNTER — Other Ambulatory Visit: Payer: Self-pay

## 2020-04-24 DIAGNOSIS — R928 Other abnormal and inconclusive findings on diagnostic imaging of breast: Secondary | ICD-10-CM

## 2020-04-24 LAB — CMP14+EGFR
ALT: 46 IU/L — ABNORMAL HIGH (ref 0–32)
AST: 41 IU/L — ABNORMAL HIGH (ref 0–40)
Albumin/Globulin Ratio: 1.3 (ref 1.2–2.2)
Albumin: 4.7 g/dL (ref 3.8–4.8)
Alkaline Phosphatase: 150 IU/L — ABNORMAL HIGH (ref 44–121)
BUN/Creatinine Ratio: 16 (ref 12–28)
BUN: 17 mg/dL (ref 8–27)
Bilirubin Total: 0.4 mg/dL (ref 0.0–1.2)
CO2: 20 mmol/L (ref 20–29)
Calcium: 10 mg/dL (ref 8.7–10.3)
Chloride: 95 mmol/L — ABNORMAL LOW (ref 96–106)
Creatinine, Ser: 1.09 mg/dL — ABNORMAL HIGH (ref 0.57–1.00)
Globulin, Total: 3.7 g/dL (ref 1.5–4.5)
Glucose: 410 mg/dL — ABNORMAL HIGH (ref 65–99)
Potassium: 4.3 mmol/L (ref 3.5–5.2)
Sodium: 138 mmol/L (ref 134–144)
Total Protein: 8.4 g/dL (ref 6.0–8.5)
eGFR: 55 mL/min/{1.73_m2} — ABNORMAL LOW (ref >59)

## 2020-04-24 LAB — IRON,TIBC AND FERRITIN PANEL
Ferritin: 184 ng/mL — ABNORMAL HIGH (ref 15–150)
Iron Saturation: 23 % (ref 15–55)
Iron: 77 ug/dL (ref 27–139)
Total Iron Binding Capacity: 331 ug/dL (ref 250–450)
UIBC: 254 ug/dL (ref 118–369)

## 2020-04-24 LAB — HEMOGLOBIN A1C
Est. average glucose Bld gHb Est-mCnc: 286 mg/dL
Hgb A1c MFr Bld: 11.6 % — ABNORMAL HIGH (ref 4.8–5.6)

## 2020-04-25 ENCOUNTER — Other Ambulatory Visit: Payer: Self-pay | Admitting: Nurse Practitioner

## 2020-04-30 ENCOUNTER — Other Ambulatory Visit: Payer: Self-pay

## 2020-04-30 ENCOUNTER — Ambulatory Visit: Payer: Medicare Other | Attending: Nurse Practitioner | Admitting: Pharmacist

## 2020-04-30 DIAGNOSIS — E781 Pure hyperglyceridemia: Secondary | ICD-10-CM

## 2020-04-30 DIAGNOSIS — I10 Essential (primary) hypertension: Secondary | ICD-10-CM

## 2020-04-30 DIAGNOSIS — I63119 Cerebral infarction due to embolism of unspecified vertebral artery: Secondary | ICD-10-CM

## 2020-04-30 MED ORDER — ATORVASTATIN CALCIUM 80 MG PO TABS
ORAL_TABLET | Freq: Every day | ORAL | 0 refills | Status: AC
Start: 2020-04-30 — End: 2021-04-30
  Filled 2020-04-30: qty 30, 30d supply, fill #0

## 2020-04-30 MED ORDER — APIXABAN 5 MG PO TABS
ORAL_TABLET | Freq: Two times a day (BID) | ORAL | 0 refills | Status: AC
  Filled 2020-04-30: qty 60, 30d supply, fill #0

## 2020-04-30 MED ORDER — AMLODIPINE BESYLATE 10 MG PO TABS
ORAL_TABLET | Freq: Every day | ORAL | 0 refills | Status: AC
  Filled 2020-04-30: qty 30, 30d supply, fill #0

## 2020-04-30 MED ORDER — LOSARTAN POTASSIUM-HCTZ 100-25 MG PO TABS
1.0000 | ORAL_TABLET | Freq: Every day | ORAL | 0 refills | Status: AC
  Filled 2020-04-30: qty 30, 30d supply, fill #0

## 2020-04-30 NOTE — Progress Notes (Signed)
   PCP: Bertram Denver, NP  S:    Patient arrives in good spirits. Presents for diabetes evaluation, education, and management. Patient was referred and last seen by Primary Care Provider on 03/04/2020. She has had issues with getting her medications in the past due to a language barrier. Today, she does have all medications but is requesting additional supply as she is traveling to [place] next month and will be out of the country for 3 months.    Family/Social History: -FHx: HTN (mother, father) -Tobacco use: never smoker, denies smokeless tobacco -Alcohol use: denies  Insurance coverage/medication affordability: [employer]  Medication adherence reported.  Current diabetes medications include: lantus 25 units daily, Trulicity 1.5 mg weekly on Mondays, Farxiga 10 mg tablet daily Current hypertension medications include: amlodipine 10 mg daily, losartan-hydrochlorothiazide 100-25 mg daily, metoprolol tartrate 25 mg twice daily Current hyperlipidemia medications include: atorvastatin 80 mg daily  Patient denies hypoglycemic events.  Patient reported dietary habits: Eats 3 meals/day Breakfast: bread Lunch: soup, pourage Dinner/Afternoon Snack: soup or vegetables Drinks: water  Patient-reported exercise habits: walks throughout the day, undisclosed amount   Patient denies nocturia (nighttime urination).  Patient denies neuropathy (nerve pain). Patient denies visual changes. Patient denies self foot exams.    O:  Lab Results  Component Value Date   HGBA1C 11.6 (H) 04/23/2020   There were no vitals filed for this visit.  Lipid Panel     Component Value Date/Time   CHOL 202 (H) 03/05/2020 0815   TRIG 237 (H) 03/05/2020 0815   HDL 48 03/05/2020 0815   CHOLHDL 4.2 03/05/2020 0815   CHOLHDL 7.8 11/07/2018 1139   VLDL 26 11/07/2018 1139   LDLCALC 113 (H) 03/05/2020 0815   Home fasting blood sugars: no values reported today 2 hour post-meal/random blood sugars: not  checking  Clinical Atherosclerotic Cardiovascular Disease (ASCVD): Yes  The ASCVD Risk score Denman George DC Jr., et al., 2013) failed to calculate for the following reasons:   The patient has a prior MI or stroke diagnosis   A/P: Diabetes longstanding currently uncontrolled but this is due to her running out of medications in the months leading up to today's visit. Patient is able to verbalize appropriate hypoglycemia management plan. Medication adherence appears optimal as she has restarted her medications and reports good compliance. I have sent an additional month's supply of her oral medications to the pharmacy per her request as she is traveling.  -Continue current regimen.  -Extensively discussed pathophysiology of diabetes, recommended lifestyle interventions, dietary effects on blood sugar control -Counseled on s/sx of and management of hypoglycemia -Next A1C anticipated 07/2020.   ASCVD risk - secondary prevention in patient with diabetes. Last LDL is not controlled. High intensity statin indicated. Atorvastatin was increased in Feb of this year when pt's last lipid panel was drawn.  -Continued atorvastatin 80 mg daily.   Written patient instructions provided.  Total time in face to face counseling 30 minutes.   Follow up w/ PCP next month.   Butch Penny, PharmD, P[place]y [place], CPP Clinical Pharmacist [facility] [phone]

## 2020-05-07 ENCOUNTER — Other Ambulatory Visit: Payer: Self-pay

## 2020-05-08 ENCOUNTER — Other Ambulatory Visit: Payer: Self-pay

## 2020-05-13 ENCOUNTER — Ambulatory Visit (INDEPENDENT_AMBULATORY_CARE_PROVIDER_SITE_OTHER): Payer: Medicare Other

## 2020-05-13 DIAGNOSIS — I639 Cerebral infarction, unspecified: Secondary | ICD-10-CM

## 2020-05-13 LAB — CUP PACEART REMOTE DEVICE CHECK
Date Time Interrogation Session: 20220427230710
Implantable Pulse Generator Implant Date: 20210409

## 2020-05-17 ENCOUNTER — Inpatient Hospital Stay: Admission: RE | Admit: 2020-05-17 | Payer: Medicare Other | Source: Ambulatory Visit

## 2020-06-03 NOTE — Progress Notes (Signed)
Carelink Summary Report / Loop Recorder 

## 2020-07-02 ENCOUNTER — Ambulatory Visit: Payer: Medicare Other | Admitting: Nurse Practitioner

## 2020-08-06 ENCOUNTER — Encounter
Payer: Medicare Other | Attending: Physical Medicine and Rehabilitation | Admitting: Physical Medicine and Rehabilitation

## 2020-08-07 ENCOUNTER — Ambulatory Visit: Payer: Medicare Other | Admitting: Physical Medicine and Rehabilitation

## 2020-09-03 ENCOUNTER — Ambulatory Visit: Payer: Medicare Other | Admitting: Adult Health

## 2020-10-21 ENCOUNTER — Other Ambulatory Visit: Payer: Self-pay

## 2021-03-08 IMAGING — DX DG CHEST 1V PORT
1 series · 1 of 1 positions shown · non-contrast
Comparison: 05/24/2006

CLINICAL DATA: Fever and hypoxemia

EXAM:
PORTABLE CHEST 1 VIEW

[chest]
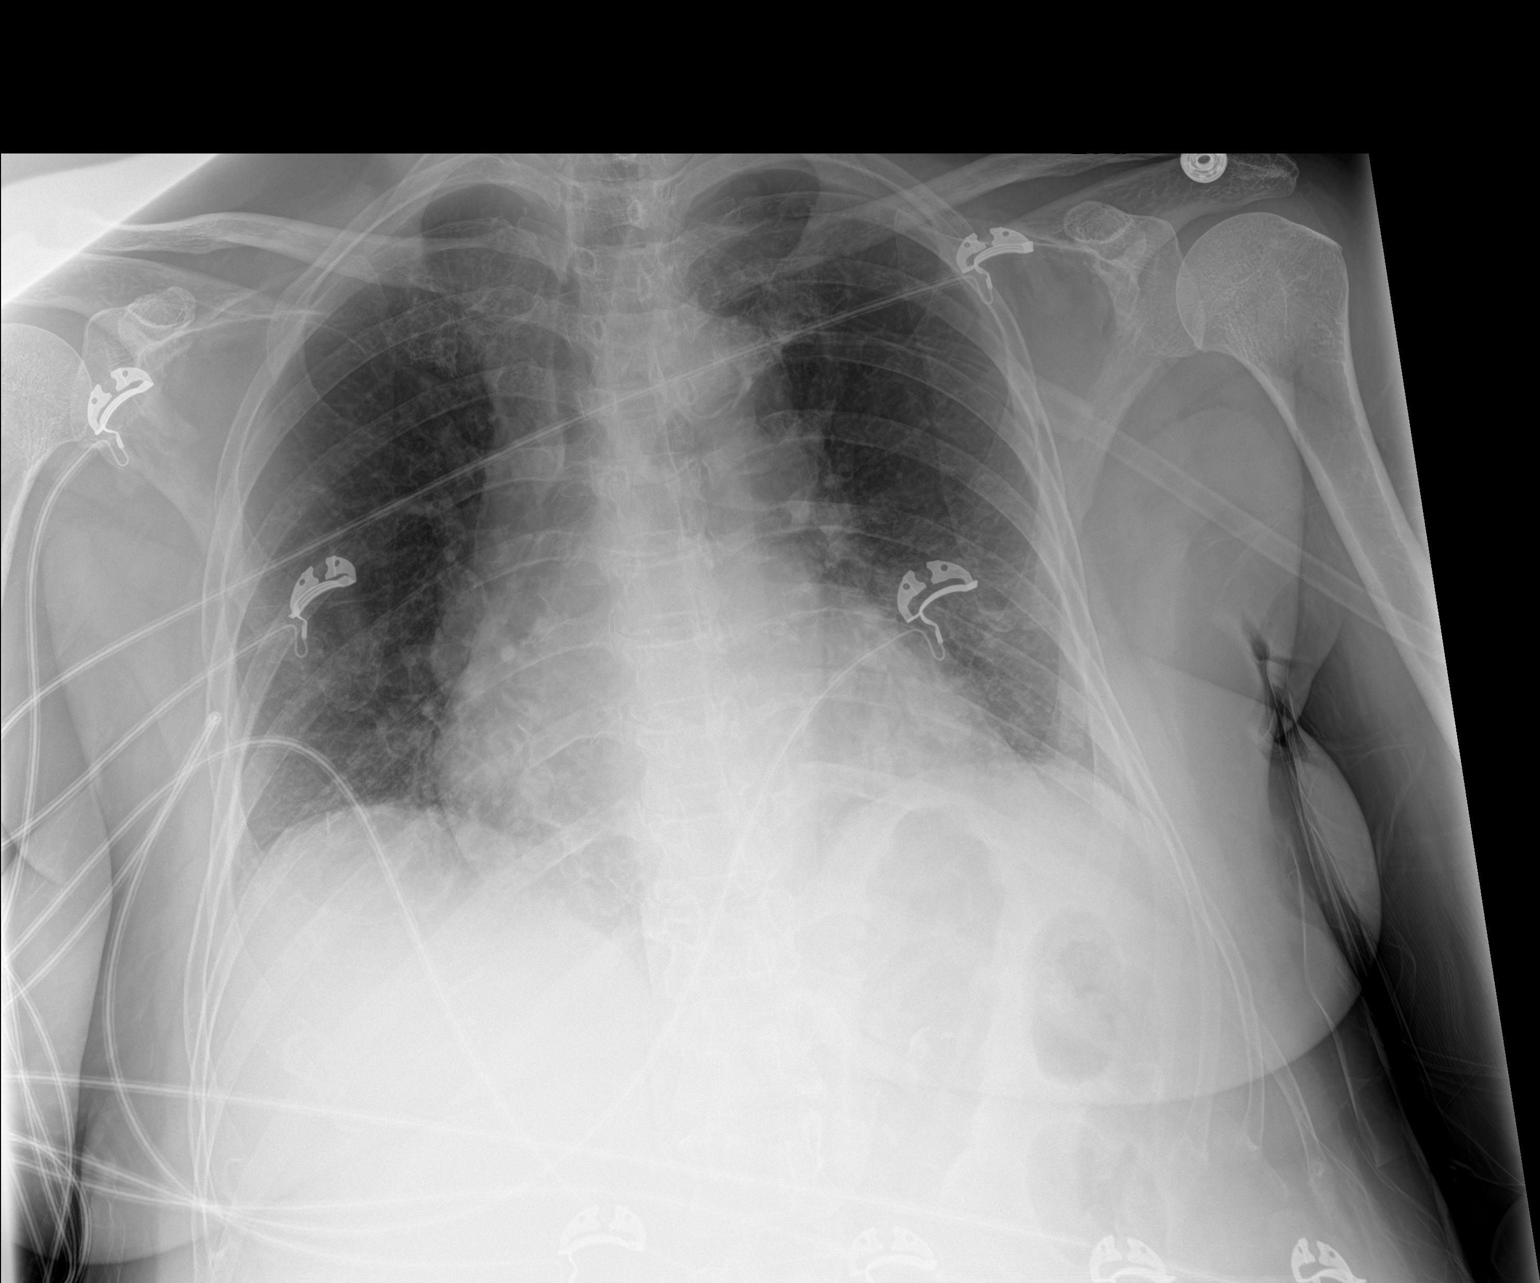

[1 of 1 positions shown; findings below may reference images not displayed]

FINDINGS: Mild cardiomegaly. No consolidation or pleural effusion. Aortic
atherosclerosis. No pneumothorax.
IMPRESSION: Mild cardiomegaly.  No focal pulmonary opacity.

## 2021-03-09 IMAGING — CT CT ABD-PELV W/O CM
2 of 4 series · 15 of 46 positions shown, 17 images · non-contrast
Comparison: Included portion from chest CTA yesterday.

CLINICAL DATA: Sepsis with unclear source of infection. Possible
pyelonephritis.

EXAM:
CT ABDOMEN AND PELVIS WITHOUT CONTRAST
TECHNIQUE: Multidetector CT imaging of the abdomen and pelvis was performed
following the standard protocol without IV contrast.

[Series 3: a/p w/o 5mm · axial · non-contrast · 0.88mm/px · z∈[+738,+1152]mm · 12 of 99 slices shown, 14 images]
[im 8/99  soft-tissue]
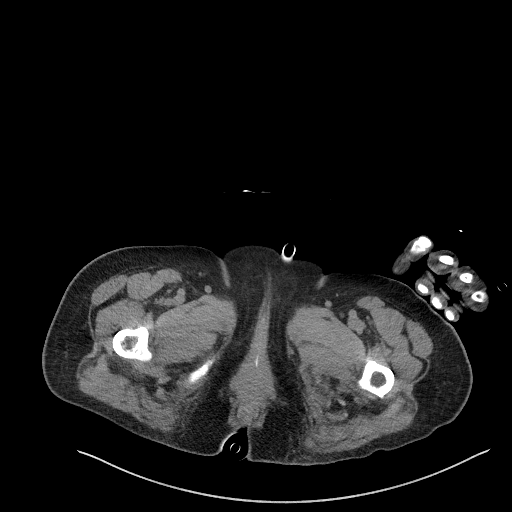
[im 8/99  bone]
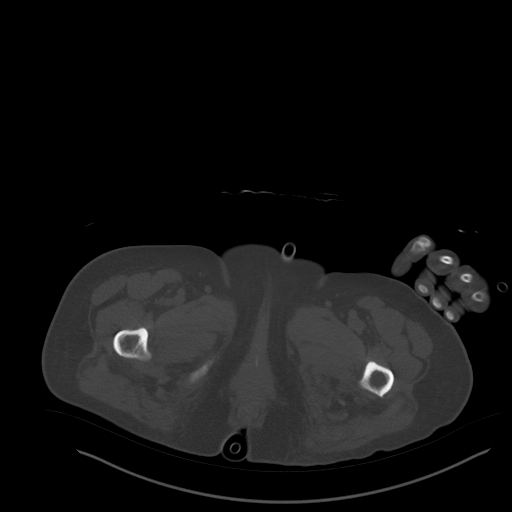
[im 16/99  soft-tissue]
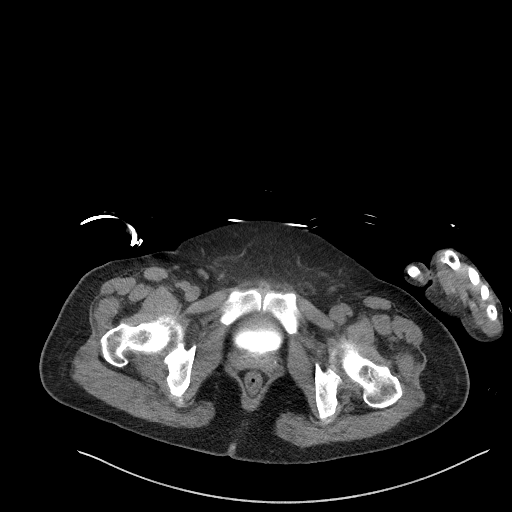
[im 23/99  soft-tissue]
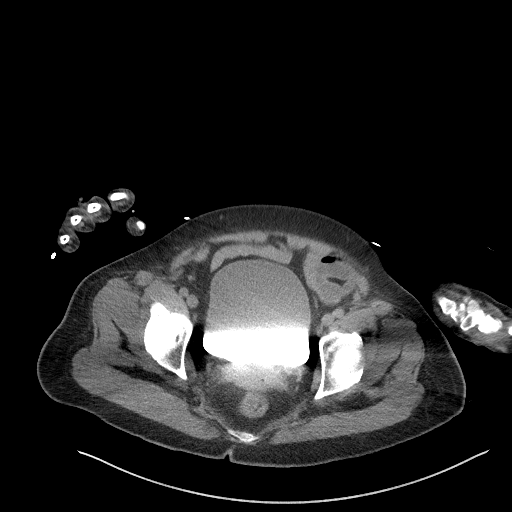
[im 31/99  soft-tissue]
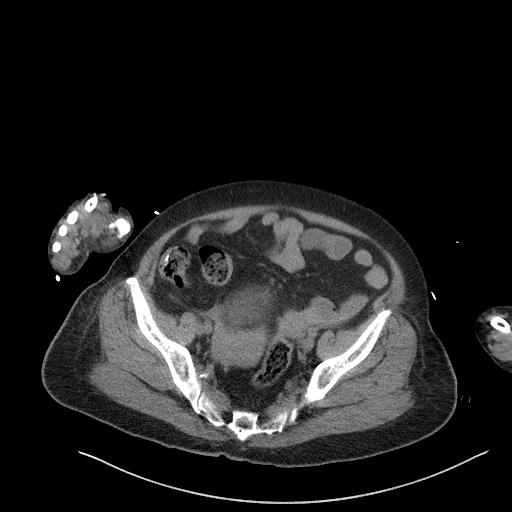
[im 38/99  soft-tissue]
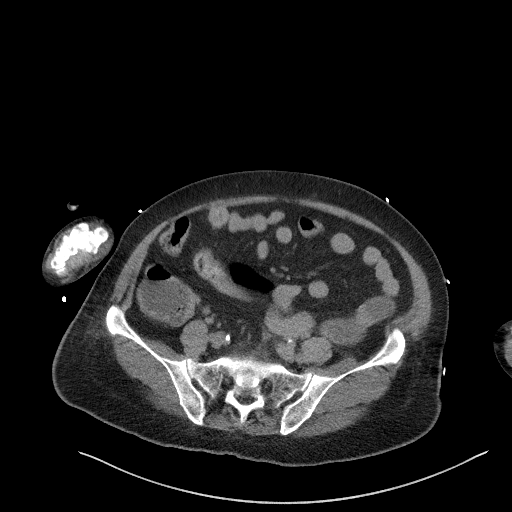
[im 46/99  soft-tissue]
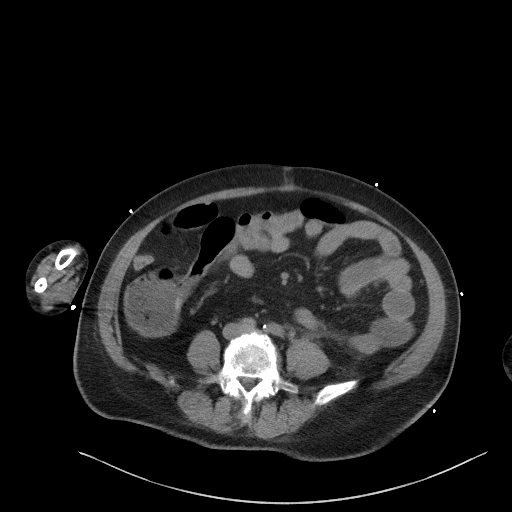
[im 53/99  soft-tissue]
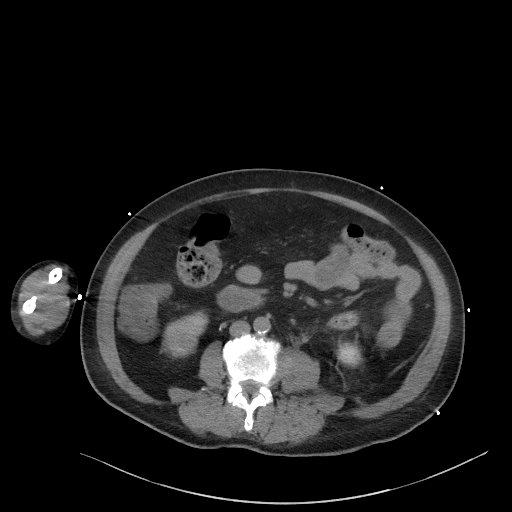
[im 61/99  soft-tissue]
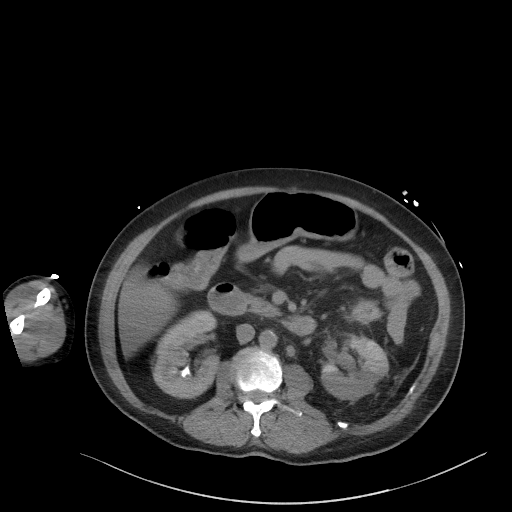
[im 68/99  soft-tissue]
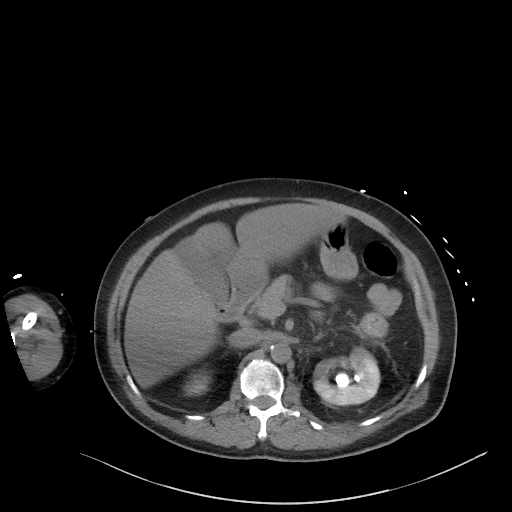
[im 68/99  bone]
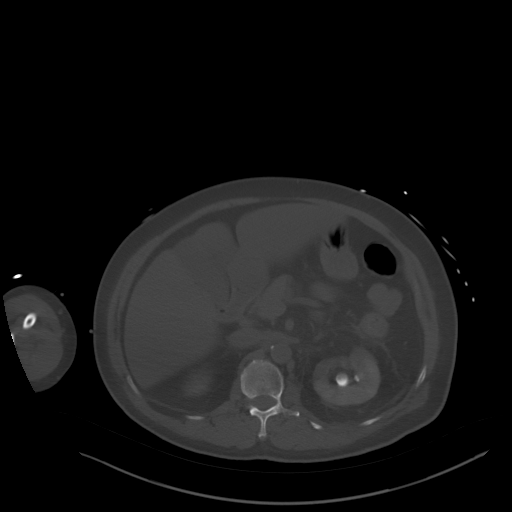
[im 76/99  soft-tissue]
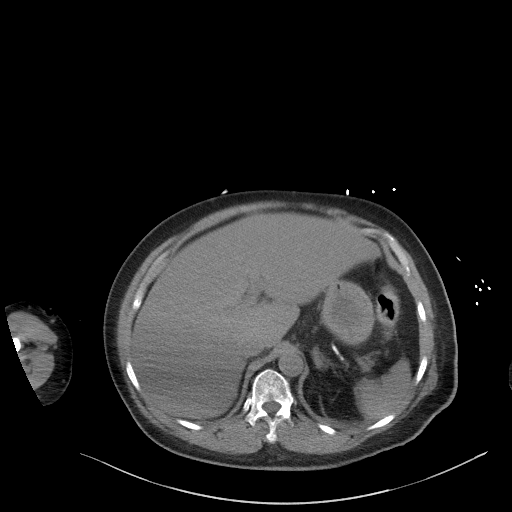
[im 83/99  soft-tissue]
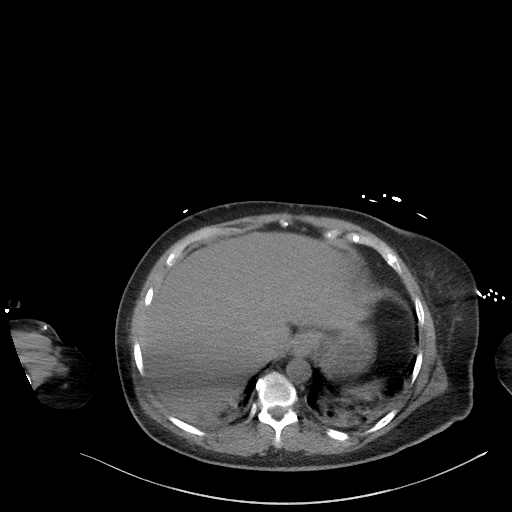
[im 91/99  soft-tissue]
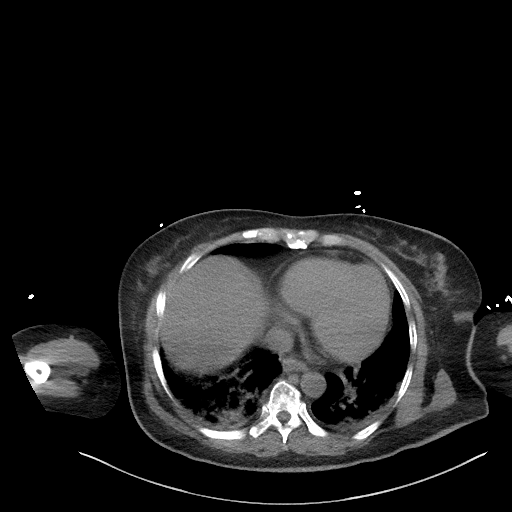

[Series 6: a/p w/o cor · coronal · non-contrast · 0.72mm/px · 3 of 133 slices shown]
[im 45/133  soft-tissue]
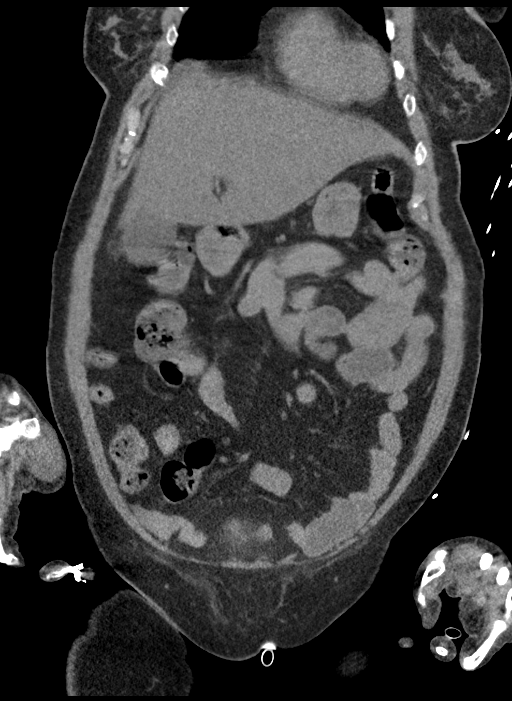
[im 59/133  soft-tissue]
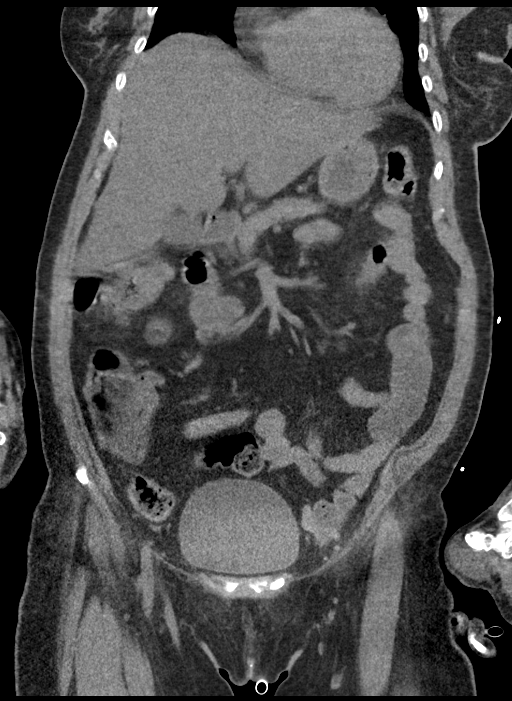
[im 74/133  soft-tissue]
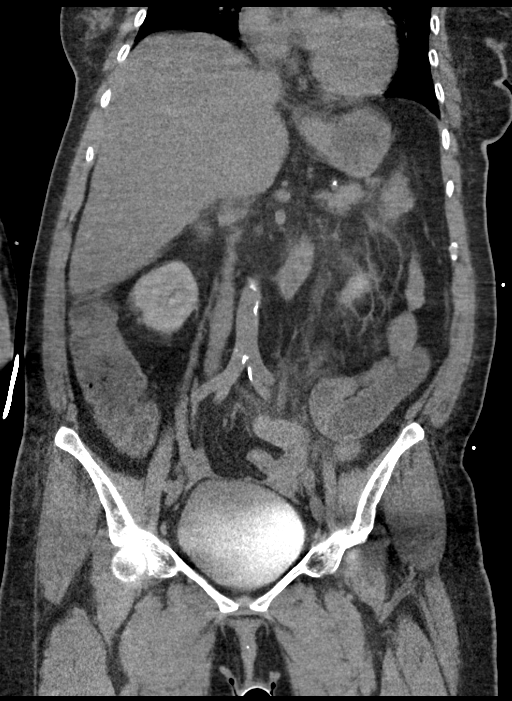

[15 of 46 positions shown; findings below may reference images not displayed]

FINDINGS: Lower chest: Bibasilar consolidations, assessed on chest CT
yesterday. Slight improvement in right middle lobe atelectasis from
prior. Trace pleural thickening.

Hepatobiliary: Decreased hepatic density consistent with steatosis.
No obvious focal abnormality on noncontrast exam. Gallbladder
physiologically distended, no calcified stone. No biliary
dilatation.

Pancreas: No ductal dilatation or inflammation.

Spleen: Small in size. No focal abnormality.

Adrenals/Urinary Tract: Left adrenal thickening. No nodule. Normal
right adrenal gland.

Excreted IV contrast from prior chest CT within both renal
collecting systems. Patchy left renal nephrogram with heterogeneous
striated appearance. Moderate left perinephric edema. No obvious
focal renal fluid collection. Mild left ureteral prominence.
Symmetric hyperdensity in the ureters at the level of pelvic brim
likely residual contrast, cannot exclude stone, however symmetric
appearance makes this less likely. Minimal right perinephric edema
without striated nephrogram. Excreted IV contrast in the urinary
bladder.

Stomach/Bowel: Nondistended stomach. Few prominent fluid-filled
small bowel loops in the left abdomen likely reactive ileus. No
evidence of obstruction. Normal appendix. Small volume of colonic
stool. Wall thickening of the splenic flexure of the colon, may
simply be reactive secondary to adjacent left renal inflammation.

Vascular/Lymphatic: Aortic atherosclerosis. No aneurysm. Multiple
left retroperitoneal nodes are likely reactive.

Reproductive: Uterus and bilateral adnexa are unremarkable.

Other: No significant free air free fluid. No evidence of
intra-abdominal abscess.

Musculoskeletal: There are no acute or suspicious osseous
abnormalities.
IMPRESSION: 1. Heterogeneous left renal nephrogram with heterogeneous striated
appearance, consistent with pyelonephritis. Moderate left
perinephric edema and mild left ureteral prominence. Possibility of
left renal infarct is difficult to exclude, however in the presence
of "dirty UA", pyelonephritis is favored.
2. Wall thickening of the splenic flexure of the colon may simply be
reactive secondary to adjacent left renal inflammation. Colitis also
considered.
3. Hepatic steatosis.
4. Lung base opacities which were assessed fully on chest CT
yesterday.

Aortic Atherosclerosis (H297T-O26.6).

## 2021-09-01 ENCOUNTER — Encounter: Payer: Self-pay | Admitting: *Deleted

## 2021-09-01 NOTE — Progress Notes (Signed)
[facility] Quality Team Note  Name: Dyani Babel Date of Birth: 12/12/1950 MRN: [id number] Date: 09/01/2021  [facility] Quality Team has reviewed this patient's chart, please see recommendations below:  Colorectal Screening; Patient requests provider to schedule colonoscopy. Patient prefers to go to ( ) for colonoscopy. [facility] Quality Other; (Tried to call pt.  Pt is due for colonoscopy and A1C check.  These are open gaps.  Unable to leave pt a voice message because voice mail not set up.)

## 2021-12-01 ENCOUNTER — Encounter: Payer: Self-pay | Admitting: *Deleted

## 2021-12-01 NOTE — Progress Notes (Signed)
[facility] Quality Team Note  Name: Aimee Parker Date of Birth: 06/03/1950 MRN: [id number] Date: 12/01/2021  [facility] Quality Team has reviewed this patient's chart, please see recommendations below:  [facility] Quality Other; (Pt has open gaps for A1C and Colonoscopy/Cologuard/ or IFOBT.  Would need completed before the end of 2023 to close gap. )
